# Patient Record
Sex: Female | Born: 1945 | Race: White | Hispanic: No | Marital: Married | Smoking: Never smoker
Health system: Southern US, Community
[De-identification: ages and names within clinical notes are randomized; demographics above are authoritative.]

## PROBLEM LIST (undated history)

## (undated) DIAGNOSIS — F329 Major depressive disorder, single episode, unspecified: Secondary | ICD-10-CM

## (undated) DIAGNOSIS — R112 Nausea with vomiting, unspecified: Secondary | ICD-10-CM

## (undated) DIAGNOSIS — E8809 Other disorders of plasma-protein metabolism, not elsewhere classified: Secondary | ICD-10-CM

## (undated) DIAGNOSIS — D496 Neoplasm of unspecified behavior of brain: Secondary | ICD-10-CM

## (undated) DIAGNOSIS — F32A Depression, unspecified: Secondary | ICD-10-CM

## (undated) DIAGNOSIS — Z923 Personal history of irradiation: Secondary | ICD-10-CM

## (undated) DIAGNOSIS — K219 Gastro-esophageal reflux disease without esophagitis: Secondary | ICD-10-CM

## (undated) DIAGNOSIS — K449 Diaphragmatic hernia without obstruction or gangrene: Secondary | ICD-10-CM

## (undated) DIAGNOSIS — Z9889 Other specified postprocedural states: Secondary | ICD-10-CM

## (undated) DIAGNOSIS — R51 Headache: Secondary | ICD-10-CM

## (undated) DIAGNOSIS — E785 Hyperlipidemia, unspecified: Secondary | ICD-10-CM

## (undated) DIAGNOSIS — C801 Malignant (primary) neoplasm, unspecified: Secondary | ICD-10-CM

## (undated) DIAGNOSIS — K439 Ventral hernia without obstruction or gangrene: Secondary | ICD-10-CM

## (undated) HISTORY — DX: Malignant (primary) neoplasm, unspecified: C80.1

## (undated) HISTORY — DX: Diaphragmatic hernia without obstruction or gangrene: K44.9

## (undated) HISTORY — DX: Neoplasm of unspecified behavior of brain: D49.6

## (undated) HISTORY — PX: CATARACT EXTRACTION: SUR2

---

## 1998-03-05 HISTORY — PX: TOTAL KNEE ARTHROPLASTY: SHX125

## 1999-03-06 HISTORY — PX: BUNIONECTOMY: SHX129

## 2005-03-05 HISTORY — PX: DILATION AND CURETTAGE OF UTERUS: SHX78

## 2005-04-24 ENCOUNTER — Ambulatory Visit (HOSPITAL_COMMUNITY): Admission: RE | Admit: 2005-04-24 | Discharge: 2005-04-24 | Payer: Self-pay | Admitting: Internal Medicine

## 2005-07-17 ENCOUNTER — Other Ambulatory Visit: Admission: RE | Admit: 2005-07-17 | Discharge: 2005-07-17 | Payer: Self-pay | Admitting: Obstetrics and Gynecology

## 2005-07-17 ENCOUNTER — Encounter: Admission: RE | Admit: 2005-07-17 | Discharge: 2005-07-17 | Payer: Self-pay | Admitting: Internal Medicine

## 2005-10-15 ENCOUNTER — Encounter: Admission: RE | Admit: 2005-10-15 | Discharge: 2005-10-15 | Payer: Self-pay | Admitting: Internal Medicine

## 2006-02-01 ENCOUNTER — Encounter (INDEPENDENT_AMBULATORY_CARE_PROVIDER_SITE_OTHER): Payer: Self-pay | Admitting: *Deleted

## 2006-02-01 ENCOUNTER — Ambulatory Visit (HOSPITAL_COMMUNITY): Admission: RE | Admit: 2006-02-01 | Discharge: 2006-02-01 | Payer: Self-pay | Admitting: Obstetrics and Gynecology

## 2006-07-10 ENCOUNTER — Other Ambulatory Visit: Admission: RE | Admit: 2006-07-10 | Discharge: 2006-07-10 | Payer: Self-pay | Admitting: Obstetrics and Gynecology

## 2006-07-19 ENCOUNTER — Encounter: Admission: RE | Admit: 2006-07-19 | Discharge: 2006-07-19 | Payer: Self-pay | Admitting: Obstetrics and Gynecology

## 2007-08-13 ENCOUNTER — Encounter: Admission: RE | Admit: 2007-08-13 | Discharge: 2007-08-13 | Payer: Self-pay | Admitting: Obstetrics and Gynecology

## 2007-09-16 ENCOUNTER — Other Ambulatory Visit: Admission: RE | Admit: 2007-09-16 | Discharge: 2007-09-16 | Payer: Self-pay | Admitting: Obstetrics and Gynecology

## 2008-09-15 ENCOUNTER — Encounter: Admission: RE | Admit: 2008-09-15 | Discharge: 2008-09-15 | Payer: Self-pay | Admitting: Obstetrics and Gynecology

## 2008-09-22 ENCOUNTER — Encounter: Admission: RE | Admit: 2008-09-22 | Discharge: 2008-09-22 | Payer: Self-pay | Admitting: Obstetrics and Gynecology

## 2008-09-29 ENCOUNTER — Other Ambulatory Visit: Admission: RE | Admit: 2008-09-29 | Discharge: 2008-09-29 | Payer: Self-pay | Admitting: Obstetrics and Gynecology

## 2009-09-19 ENCOUNTER — Encounter: Admission: RE | Admit: 2009-09-19 | Discharge: 2009-09-19 | Payer: Self-pay | Admitting: Obstetrics and Gynecology

## 2009-10-25 ENCOUNTER — Other Ambulatory Visit: Admission: RE | Admit: 2009-10-25 | Discharge: 2009-10-25 | Payer: Self-pay | Admitting: Obstetrics and Gynecology

## 2010-03-27 ENCOUNTER — Encounter: Payer: Self-pay | Admitting: Obstetrics and Gynecology

## 2010-07-21 NOTE — H&P (Signed)
Kelly Maxwell, Kelly Maxwell                 ACCOUNT NO.:  [id number]   MEDICAL RECORD NO.:  [id number]           PATIENT TYPE:   LOCATION:                                FACILITY:  [facility]   PHYSICIAN:  Gerald Leitz, MD          DATE OF BIRTH:  [birth year]   DATE OF ADMISSION:  02/01/2006  DATE OF DISCHARGE:                              HISTORY & PHYSICAL   HISTORY OF PRESENT ILLNESS:  This is a 65 year old G3, P3, with  postmenopausal bleeding.  She underwent an endometrial biopsy that was  found to be inadequate for ruling out hyperplasia or malignancy.  She  had an ultrasound on October 30 that showed the endometrium to be  echogenic and thickened fundally with a total thickness of 0.9 cm, a  questionable hyperechoic mass measuring 1.1 x 0.8 cm in the fundal  aspect of the endometrium.  Ovaries were normal bilaterally.  The  patient desires dilation and curettage hysteroscopy for further  evaluation.   OBSTETRICAL HISTORY:  Spontaneous vaginal every x3.   GYN HISTORY:  No history of sexually transmitted diseases.  Remote  history of abnormal Pap smears that resolved spontaneously.   PAST MEDICAL HISTORY:  Hypercholesterolemia.   PAST SURGICAL HISTORY:  Bunion removal and left total knee replacement.   MEDICATIONS:  Zocor, Wellbutrin and aspirin.   ALLERGIES:  COMPAZINE and CODEINE, which causes nausea.   SOCIAL HISTORY:  The patient is married.  She denies tobacco, alcohol or  illicit drug use.   FAMILY HISTORY:  Negative for breast, ovarian or colon cancer.   REVIEW OF SYSTEMS:  Negative except as stated in history of present  illness.   PHYSICAL EXAMINATION:  VITAL SIGNS:  Height 5 feet 6 inches, weight 179  pounds, blood pressure 122/86.  CARDIOVASCULAR:  Regular rate and rhythm.  LUNGS:  Clear to auscultation bilaterally.  ABDOMEN:  Soft, nontender, nondistended.  No masses.  PELVIC:  Normal external female genitalia.  No vulvar, vaginal or  cervical lesions noted.  Bimanual  exam reveals a normal-sized uterus.  No adnexal mass or tenderness.  EXTREMITIES:  No clubbing, cyanosis or edema is noted.   Ultrasound results from October 30 showed the uterus to measure 10.4 cm  in length, AP diameter is 4.6 cm, width is 5.2 cm, endometrial thickness  is 0.9 cm.  The right and left ovaries appeared normal bilaterally.   ASSESSMENT AND PLAN:  Postmenopausal bleeding with questionable fundal  endometrial mass.  Recommend hysteroscopy with D&C and possible removal  of endometrial polyp.  Risks, benefits, alternatives of surgery were  discussed with the patient including but not limited to infection,  bleeding, possible uterine perforation with need for further surgery.  Risks of transfusion, HIV, hepatitis B and C were discussed.  The  patient understands risks and desires to proceed with D&C, hysteroscopy  and possible removal of endometrial polyps.      Gerald Leitz, MD  Electronically Signed     TC/MEDQ  D:  01/29/2006  T:  01/30/2006  Job:  [id number]

## 2010-07-21 NOTE — Op Note (Signed)
Kelly Maxwell, Kelly Maxwell                 ACCOUNT NO.:  [id number]   MEDICAL RECORD NO.:  [id number]          PATIENT TYPE:  AMB   LOCATION:  [facility]                           FACILITY:  [facility]   PHYSICIAN:  Gerald Leitz, MD          DATE OF BIRTH:  1946-01-25   DATE OF PROCEDURE:  02/01/2006  DATE OF DISCHARGE:                               OPERATIVE REPORT   PREOPERATIVE DIAGNOSIS:  Post menopausal bleeding, possible endometrial  polyp.   POSTOPERATIVE DIAGNOSIS:  Post menopausal bleeding and endometrial  polyp.   PROCEDURE:  Hysteroscopy with resection of endometrial polyp, dilation  and curettage.   SURGEON:  Gerald Leitz, M.D.   ANESTHESIA:  General.   SPECIMEN:  Endometrial curettings and polyp sent to pathology.   ESTIMATED BLOOD LOSS:  Approximately 15 mL.   COMPLICATIONS:  None.   DESCRIPTION OF PROCEDURE:  The patient was taken to the operating room  where she was placed in the dorsal lithotomy position.  She was prepped  and draped in the usual sterile fashion.  A bivalve speculum was placed  into the vaginal vault.  The anterior lip of the cervix was grasped with  a single tooth tenaculum.  The uterus was sounded to 7.5 cm. The cervix  was then dilated to a number 31 Jackson-Pratt dilator.  The hysteroscope  was inserted.  The patient was noted to have an endometrial polyp and a  small 1 cm fibroid.  The hysteroscope was removed. Dilation and  curettage was performed with a sharp curet until a gritty texture was  noted all the way around. The hysteroscope and resectoscope was then  reinserted into the uterus and the polyp was resected using the  resectoscope loop.  10 mL of 0.25% Marcaine was injected to obtain a  paracervical block for postoperative anesthesia. The single tooth  tenaculum was removed from the anterior lip of the cervix.  Hemostasis  was maintained with silver nitrate.  Excellent hemostasis was then  noted.  The bivalve speculum was removed from the vaginal  vault.  Sponge, lap and needle counts were correct x2.   FINDINGS:  One endometrial polyps and a 1 cm fibroid were noted.      Gerald Leitz, MD  Electronically Signed     TC/MEDQ  D:  02/01/2006  T:  02/01/2006  Job:  [id number]

## 2010-09-12 ENCOUNTER — Other Ambulatory Visit: Payer: Self-pay | Admitting: Internal Medicine

## 2010-09-12 DIAGNOSIS — Z1231 Encounter for screening mammogram for malignant neoplasm of breast: Secondary | ICD-10-CM

## 2010-09-21 ENCOUNTER — Ambulatory Visit
Admission: RE | Admit: 2010-09-21 | Discharge: 2010-09-21 | Disposition: A | Discharge status: Home / Self Care | Payer: Medicare Other | Source: Ambulatory Visit | Attending: Internal Medicine | Admitting: Internal Medicine

## 2010-09-21 ENCOUNTER — Ambulatory Visit: Payer: Self-pay

## 2010-09-21 DIAGNOSIS — Z1231 Encounter for screening mammogram for malignant neoplasm of breast: Secondary | ICD-10-CM

## 2010-11-02 ENCOUNTER — Ambulatory Visit (HOSPITAL_COMMUNITY)
Admission: RE | Admit: 2010-11-02 | Discharge: 2010-11-02 | Disposition: A | Discharge status: Home / Self Care | Payer: Medicare Other | Source: Ambulatory Visit | Attending: Gastroenterology | Admitting: Gastroenterology

## 2010-11-02 ENCOUNTER — Other Ambulatory Visit: Payer: Self-pay | Admitting: Gastroenterology

## 2010-11-02 DIAGNOSIS — K219 Gastro-esophageal reflux disease without esophagitis: Secondary | ICD-10-CM | POA: Insufficient documentation

## 2010-11-02 DIAGNOSIS — E785 Hyperlipidemia, unspecified: Secondary | ICD-10-CM | POA: Insufficient documentation

## 2010-11-02 DIAGNOSIS — D126 Benign neoplasm of colon, unspecified: Secondary | ICD-10-CM | POA: Insufficient documentation

## 2010-11-02 DIAGNOSIS — Z860100 Personal history of colon polyps, unspecified: Secondary | ICD-10-CM | POA: Insufficient documentation

## 2010-11-02 DIAGNOSIS — Z79899 Other long term (current) drug therapy: Secondary | ICD-10-CM | POA: Insufficient documentation

## 2010-11-02 DIAGNOSIS — K5730 Diverticulosis of large intestine without perforation or abscess without bleeding: Secondary | ICD-10-CM | POA: Insufficient documentation

## 2010-11-12 NOTE — Op Note (Signed)
  NAMEEBONE, ALCIVAR NO.:  [id number]  MEDICAL RECORD NO.:  [id number]  LOCATION:  [facility]                         FACILITY:  [facility]  PHYSICIAN:  Danise Edge, M.D.   DATE OF BIRTH:  March 29, 1945  DATE OF PROCEDURE:  11/02/2010 DATE OF DISCHARGE:                              OPERATIVE REPORT   PROCEDURE:  Surveillance coloscopy.  REFERRING PHYSICIAN:  [name], MD.  HISTORY:  Ms. Artha Stavros is a 65 year old female, born on October 29, 1945.  Approximately 10 years ago, the patient underwent a colonoscopy with removal of neoplastic colon polyps.  In 2007, she underwent a normal surveillance colonoscopy.  She is scheduled to undergo a surveillance colonoscopy today.  ENDOSCOPIST:  Danise Edge, M.D.  PREMEDICATIONS:  Versed 6 mg, Fentanyl 50 mcg.  PROCEDURE:  The patient was placed in the left lateral decubitus position.  Anal inspection and digital rectal exam were normal.  The Pentax pediatric colonoscope was introduced into the rectum and easily advanced to the cecum.  A normal-appearing ileocecal valve and appendiceal orifice were identified.  Colonic preparation for the exam today was good.  Rectum normal.  Retroflexed view of the distal rectum normal. Sigmoid colon.  Colonic diverticulosis. Descending colon.  From the proximal descending colon, a 3 mm sessile polyp was removed with cold biopsy forceps. Splenic flexure normal. Transverse colon normal. Hepatic flexure normal. Ascending colon normal. Cecum and ileocecal valve.  From the mid-cecum, a 3-mm sessile polyp was removed with cold biopsy forceps.  ASSESSMENT: 1. Sigmoid colonic diverticulosis. 2. A 3 mm polyp was removed from the cecum. 3. A 3 mm polyp was removed from the proximal descending colon.  RECOMMENDATIONS:  Repeat surveillance colonoscopy in 5 years.          ______________________________ Danise Edge, M.D.     MJ/MEDQ  D:  11/02/2010  T:  11/02/2010  Job:   [id number]  cc:   [name], MD Fax: [phone]  Electronically Signed by Danise Edge M.D. on 11/12/2010 09:13:40 AM

## 2011-07-03 ENCOUNTER — Other Ambulatory Visit: Payer: Self-pay | Admitting: Dermatology

## 2011-08-13 ENCOUNTER — Other Ambulatory Visit: Payer: Self-pay | Admitting: Internal Medicine

## 2011-08-13 DIAGNOSIS — Z1231 Encounter for screening mammogram for malignant neoplasm of breast: Secondary | ICD-10-CM

## 2011-10-10 ENCOUNTER — Ambulatory Visit
Admission: RE | Admit: 2011-10-10 | Discharge: 2011-10-10 | Disposition: A | Discharge status: Home / Self Care | Payer: Medicare Other | Source: Ambulatory Visit | Attending: Internal Medicine | Admitting: Internal Medicine

## 2011-10-10 DIAGNOSIS — Z1231 Encounter for screening mammogram for malignant neoplasm of breast: Secondary | ICD-10-CM

## 2011-10-15 ENCOUNTER — Other Ambulatory Visit: Payer: Self-pay | Admitting: Internal Medicine

## 2011-10-15 DIAGNOSIS — R928 Other abnormal and inconclusive findings on diagnostic imaging of breast: Secondary | ICD-10-CM

## 2011-10-23 ENCOUNTER — Ambulatory Visit
Admission: RE | Admit: 2011-10-23 | Discharge: 2011-10-23 | Disposition: A | Discharge status: Home / Self Care | Payer: Medicare Other | Source: Ambulatory Visit | Attending: Internal Medicine | Admitting: Internal Medicine

## 2011-10-23 DIAGNOSIS — R928 Other abnormal and inconclusive findings on diagnostic imaging of breast: Secondary | ICD-10-CM

## 2011-11-20 ENCOUNTER — Other Ambulatory Visit: Payer: Self-pay | Admitting: Obstetrics and Gynecology

## 2011-11-20 ENCOUNTER — Other Ambulatory Visit (HOSPITAL_COMMUNITY)
Admission: RE | Admit: 2011-11-20 | Discharge: 2011-11-20 | Disposition: A | Discharge status: Home / Self Care | Payer: Medicare Other | Source: Ambulatory Visit | Attending: Obstetrics and Gynecology | Admitting: Obstetrics and Gynecology

## 2011-11-20 DIAGNOSIS — Z124 Encounter for screening for malignant neoplasm of cervix: Secondary | ICD-10-CM | POA: Insufficient documentation

## 2011-12-12 ENCOUNTER — Other Ambulatory Visit: Payer: Self-pay | Admitting: Internal Medicine

## 2011-12-12 DIAGNOSIS — R413 Other amnesia: Secondary | ICD-10-CM

## 2011-12-12 DIAGNOSIS — R519 Headache, unspecified: Secondary | ICD-10-CM

## 2011-12-12 LAB — CREATININE, SERUM: Creat: 0.5 mg/dL (ref 0.50–1.10)

## 2011-12-13 ENCOUNTER — Ambulatory Visit
Admission: RE | Admit: 2011-12-13 | Discharge: 2011-12-13 | Disposition: A | Discharge status: Home / Self Care | Payer: Medicare Other | Source: Ambulatory Visit | Attending: Internal Medicine | Admitting: Internal Medicine

## 2011-12-13 ENCOUNTER — Other Ambulatory Visit: Payer: Self-pay | Admitting: Internal Medicine

## 2011-12-13 DIAGNOSIS — G9389 Other specified disorders of brain: Secondary | ICD-10-CM

## 2011-12-13 DIAGNOSIS — R519 Headache, unspecified: Secondary | ICD-10-CM

## 2011-12-13 DIAGNOSIS — R413 Other amnesia: Secondary | ICD-10-CM

## 2011-12-13 MED ORDER — GADOBENATE DIMEGLUMINE 529 MG/ML IV SOLN
15.0000 mL | Freq: Once | INTRAVENOUS | Status: AC | PRN
  Administered 2011-12-13: 15 mL via INTRAVENOUS

## 2011-12-14 ENCOUNTER — Other Ambulatory Visit (HOSPITAL_COMMUNITY): Payer: Self-pay | Admitting: Neurosurgery

## 2011-12-14 ENCOUNTER — Encounter (HOSPITAL_COMMUNITY): Payer: Self-pay | Admitting: *Deleted

## 2011-12-14 ENCOUNTER — Other Ambulatory Visit: Payer: Self-pay | Admitting: Neurosurgery

## 2011-12-14 ENCOUNTER — Ambulatory Visit
Admission: RE | Admit: 2011-12-14 | Discharge: 2011-12-14 | Disposition: A | Discharge status: Home / Self Care | Payer: Medicare Other | Source: Ambulatory Visit | Attending: Internal Medicine | Admitting: Internal Medicine

## 2011-12-14 ENCOUNTER — Encounter (HOSPITAL_COMMUNITY): Payer: Self-pay | Admitting: Pharmacy Technician

## 2011-12-14 DIAGNOSIS — D496 Neoplasm of unspecified behavior of brain: Secondary | ICD-10-CM

## 2011-12-14 DIAGNOSIS — G9389 Other specified disorders of brain: Secondary | ICD-10-CM

## 2011-12-14 MED ORDER — IOHEXOL 300 MG/ML  SOLN
100.0000 mL | Freq: Once | INTRAMUSCULAR | Status: AC | PRN
  Administered 2011-12-14: 100 mL via INTRAVENOUS

## 2011-12-15 ENCOUNTER — Ambulatory Visit (HOSPITAL_COMMUNITY)
Admission: RE | Admit: 2011-12-15 | Discharge: 2011-12-15 | Disposition: A | Discharge status: Home / Self Care | Payer: Medicare Other | Source: Ambulatory Visit | Attending: Neurosurgery | Admitting: Neurosurgery

## 2011-12-15 DIAGNOSIS — D496 Neoplasm of unspecified behavior of brain: Secondary | ICD-10-CM

## 2011-12-15 DIAGNOSIS — G939 Disorder of brain, unspecified: Secondary | ICD-10-CM | POA: Insufficient documentation

## 2011-12-15 MED ORDER — IOHEXOL 300 MG/ML  SOLN
50.0000 mL | Freq: Once | INTRAMUSCULAR | Status: AC | PRN
  Administered 2011-12-15: 50 mL via INTRAVENOUS

## 2011-12-16 MED ORDER — CEFAZOLIN SODIUM-DEXTROSE 2-3 GM-% IV SOLR
2.0000 g | INTRAVENOUS | Status: AC
  Administered 2011-12-17: 2 g via INTRAVENOUS
  Filled 2011-12-16: qty 50

## 2011-12-17 ENCOUNTER — Ambulatory Visit (HOSPITAL_COMMUNITY): Payer: Medicare Other | Admitting: Anesthesiology

## 2011-12-17 ENCOUNTER — Encounter (HOSPITAL_COMMUNITY)
Admission: RE | Disposition: A | Discharge status: Home / Self Care | Payer: Self-pay | Source: Ambulatory Visit | Attending: Neurosurgery

## 2011-12-17 ENCOUNTER — Inpatient Hospital Stay (HOSPITAL_COMMUNITY)
Admission: RE | Admit: 2011-12-17 | Discharge: 2011-12-20 | DRG: 027 | Disposition: A | Discharge status: Home / Self Care | Payer: Medicare Other | Source: Ambulatory Visit | Attending: Neurosurgery | Admitting: Neurosurgery

## 2011-12-17 ENCOUNTER — Encounter (HOSPITAL_COMMUNITY): Payer: Self-pay | Admitting: Anesthesiology

## 2011-12-17 ENCOUNTER — Encounter (HOSPITAL_COMMUNITY): Payer: Self-pay | Admitting: *Deleted

## 2011-12-17 DIAGNOSIS — D496 Neoplasm of unspecified behavior of brain: Secondary | ICD-10-CM | POA: Diagnosis present

## 2011-12-17 DIAGNOSIS — Z96659 Presence of unspecified artificial knee joint: Secondary | ICD-10-CM

## 2011-12-17 DIAGNOSIS — F329 Major depressive disorder, single episode, unspecified: Secondary | ICD-10-CM | POA: Diagnosis present

## 2011-12-17 DIAGNOSIS — F3289 Other specified depressive episodes: Secondary | ICD-10-CM | POA: Diagnosis present

## 2011-12-17 DIAGNOSIS — E785 Hyperlipidemia, unspecified: Secondary | ICD-10-CM | POA: Diagnosis present

## 2011-12-17 DIAGNOSIS — C712 Malignant neoplasm of temporal lobe: Principal | ICD-10-CM | POA: Diagnosis present

## 2011-12-17 DIAGNOSIS — K219 Gastro-esophageal reflux disease without esophagitis: Secondary | ICD-10-CM | POA: Diagnosis present

## 2011-12-17 HISTORY — DX: Hyperlipidemia, unspecified: E78.5

## 2011-12-17 HISTORY — DX: Major depressive disorder, single episode, unspecified: F32.9

## 2011-12-17 HISTORY — PX: CRANIOTOMY: SHX93

## 2011-12-17 HISTORY — DX: Depression, unspecified: F32.A

## 2011-12-17 HISTORY — DX: Gastro-esophageal reflux disease without esophagitis: K21.9

## 2011-12-17 HISTORY — DX: Other specified postprocedural states: Z98.890

## 2011-12-17 HISTORY — DX: Headache: R51

## 2011-12-17 HISTORY — DX: Other specified postprocedural states: R11.2

## 2011-12-17 HISTORY — DX: Neoplasm of unspecified behavior of brain: D49.6

## 2011-12-17 LAB — BASIC METABOLIC PANEL
BUN: 25 mg/dL — ABNORMAL HIGH (ref 6–23)
CO2: 25 mEq/L (ref 19–32)
Chloride: 103 mEq/L (ref 96–112)
Creatinine, Ser: 0.65 mg/dL (ref 0.50–1.10)
GFR calc Af Amer: >90 mL/min (ref >90)
Glucose, Bld: 110 mg/dL — ABNORMAL HIGH (ref 70–99)
Potassium: 4.2 mEq/L (ref 3.5–5.1)

## 2011-12-17 LAB — CBC
HCT: 42.5 % (ref 36.0–46.0)
Hemoglobin: 14.7 g/dL (ref 12.0–15.0)
MCH: 30.9 pg (ref 26.0–34.0)
MCHC: 34.6 g/dL (ref 30.0–36.0)
MCV: 89.3 fL (ref 78.0–100.0)

## 2011-12-17 LAB — TYPE AND SCREEN: Antibody Screen: NEGATIVE

## 2011-12-17 SURGERY — CRANIOTOMY TUMOR EXCISION
Anesthesia: General | Site: Head | Laterality: Left | Wound class: Clean

## 2011-12-17 MED ORDER — ROCURONIUM BROMIDE 100 MG/10ML IV SOLN
INTRAVENOUS | Status: DC | PRN
  Administered 2011-12-17: 50 mg via INTRAVENOUS

## 2011-12-17 MED ORDER — DEXAMETHASONE SODIUM PHOSPHATE 4 MG/ML IJ SOLN
4.0000 mg | Freq: Four times a day (QID) | INTRAMUSCULAR | Status: AC
  Administered 2011-12-18 – 2011-12-19 (×4): 4 mg via INTRAVENOUS
  Filled 2011-12-17 (×4): qty 1

## 2011-12-17 MED ORDER — THROMBIN 20000 UNITS EX SOLR
CUTANEOUS | Status: DC | PRN
  Administered 2011-12-17: 13:00:00 via TOPICAL

## 2011-12-17 MED ORDER — MUPIROCIN 2 % EX OINT
TOPICAL_OINTMENT | Freq: Two times a day (BID) | CUTANEOUS | Status: DC
  Administered 2011-12-17: 1 via NASAL
  Administered 2011-12-18 – 2011-12-19 (×4): via NASAL
  Filled 2011-12-17 (×2): qty 22

## 2011-12-17 MED ORDER — SIMVASTATIN 40 MG PO TABS
40.0000 mg | ORAL_TABLET | Freq: Every day | ORAL | Status: DC
  Administered 2011-12-18 – 2011-12-19 (×2): 40 mg via ORAL
  Filled 2011-12-17 (×4): qty 1

## 2011-12-17 MED ORDER — HYDROMORPHONE HCL PF 1 MG/ML IJ SOLN
0.2500 mg | INTRAMUSCULAR | Status: DC | PRN
  Administered 2011-12-17: 0.5 mg via INTRAVENOUS

## 2011-12-17 MED ORDER — DEXAMETHASONE SODIUM PHOSPHATE 10 MG/ML IJ SOLN
6.0000 mg | Freq: Four times a day (QID) | INTRAMUSCULAR | Status: AC
  Administered 2011-12-17 – 2011-12-18 (×4): 6 mg via INTRAVENOUS
  Filled 2011-12-17 (×4): qty 0.6

## 2011-12-17 MED ORDER — ONDANSETRON HCL 4 MG PO TABS
4.0000 mg | ORAL_TABLET | ORAL | Status: DC | PRN
Start: 2011-12-17 — End: 2011-12-20

## 2011-12-17 MED ORDER — MORPHINE SULFATE 2 MG/ML IJ SOLN
INTRAMUSCULAR | Status: AC
  Filled 2011-12-17: qty 1

## 2011-12-17 MED ORDER — MANNITOL 25 % IV SOLN
INTRAVENOUS | Status: DC | PRN
  Administered 2011-12-17: 25 g via INTRAVENOUS

## 2011-12-17 MED ORDER — PANTOPRAZOLE SODIUM 40 MG PO TBEC: 80.0000 mg | DELAYED_RELEASE_TABLET | Freq: Every day | ORAL | Status: DC

## 2011-12-17 MED ORDER — FENTANYL CITRATE 0.05 MG/ML IJ SOLN
INTRAMUSCULAR | Status: DC | PRN
  Administered 2011-12-17: 50 ug via INTRAVENOUS
  Administered 2011-12-17: 150 ug via INTRAVENOUS
  Administered 2011-12-17: 50 ug via INTRAVENOUS

## 2011-12-17 MED ORDER — SODIUM CHLORIDE 0.9 % IV SOLN: 500.0000 mg | INTRAVENOUS | Status: DC | PRN

## 2011-12-17 MED ORDER — MORPHINE SULFATE 2 MG/ML IJ SOLN
1.0000 mg | INTRAMUSCULAR | Status: DC | PRN
  Administered 2011-12-17: 2 mg via INTRAVENOUS

## 2011-12-17 MED ORDER — ESTRADIOL 1 MG PO TABS
0.5000 mg | ORAL_TABLET | Freq: Every day | ORAL | Status: DC
  Administered 2011-12-18 – 2011-12-19 (×2): 0.5 mg via ORAL
  Filled 2011-12-17 (×4): qty 0.5

## 2011-12-17 MED ORDER — BACITRACIN 50000 UNITS IM SOLR
INTRAMUSCULAR | Status: AC
  Filled 2011-12-17: qty 1

## 2011-12-17 MED ORDER — TRETINOIN 0.025 % EX CREA: 1.0000 "application " | TOPICAL_CREAM | Freq: Every day | CUTANEOUS | Status: DC

## 2011-12-17 MED ORDER — HYDROCODONE-ACETAMINOPHEN 5-325 MG PO TABS
1.0000 | ORAL_TABLET | ORAL | Status: DC | PRN
  Administered 2011-12-17 – 2011-12-20 (×3): 1 via ORAL
  Filled 2011-12-17 (×3): qty 1

## 2011-12-17 MED ORDER — SODIUM CHLORIDE 0.9 % IV SOLN
INTRAVENOUS | Status: DC | PRN
  Administered 2011-12-17 (×2): via INTRAVENOUS

## 2011-12-17 MED ORDER — LABETALOL HCL 5 MG/ML IV SOLN: 10.0000 mg | INTRAVENOUS | Status: DC | PRN

## 2011-12-17 MED ORDER — DEXAMETHASONE SODIUM PHOSPHATE 4 MG/ML IJ SOLN
4.0000 mg | Freq: Three times a day (TID) | INTRAMUSCULAR | Status: DC
  Administered 2011-12-19 – 2011-12-20 (×2): 4 mg via INTRAVENOUS
  Filled 2011-12-17 (×5): qty 1

## 2011-12-17 MED ORDER — ONDANSETRON HCL 4 MG/2ML IJ SOLN: 4.0000 mg | Freq: Once | INTRAMUSCULAR | Status: DC | PRN

## 2011-12-17 MED ORDER — NITROGLYCERIN IN D5W 200-5 MCG/ML-% IV SOLN
INTRAVENOUS | Status: DC | PRN
  Administered 2011-12-17: 40 ug/min via INTRAVENOUS

## 2011-12-17 MED ORDER — SODIUM CHLORIDE 0.9 % IR SOLN
Status: DC | PRN
  Administered 2011-12-17 (×3): 1000 mL

## 2011-12-17 MED ORDER — CEFAZOLIN SODIUM 1-5 GM-% IV SOLN
1.0000 g | Freq: Three times a day (TID) | INTRAVENOUS | Status: AC
  Administered 2011-12-17 – 2011-12-18 (×4): 1 g via INTRAVENOUS
  Filled 2011-12-17 (×4): qty 50

## 2011-12-17 MED ORDER — VECURONIUM BROMIDE 10 MG IV SOLR
INTRAVENOUS | Status: DC | PRN
  Administered 2011-12-17: 2 mg via INTRAVENOUS

## 2011-12-17 MED ORDER — LIDOCAINE HCL (CARDIAC) 20 MG/ML IV SOLN
INTRAVENOUS | Status: DC | PRN
  Administered 2011-12-17: 100 mg via INTRAVENOUS

## 2011-12-17 MED ORDER — NYSTATIN-TRIAMCINOLONE 100000-0.1 UNIT/GM-% EX CREA
1.0000 "application " | TOPICAL_CREAM | Freq: Every day | CUTANEOUS | Status: DC
  Administered 2011-12-18 – 2011-12-19 (×2): 1 via TOPICAL
  Filled 2011-12-17: qty 15

## 2011-12-17 MED ORDER — MIDAZOLAM HCL 5 MG/5ML IJ SOLN
INTRAMUSCULAR | Status: DC | PRN
  Administered 2011-12-17: 2 mg via INTRAVENOUS

## 2011-12-17 MED ORDER — ACETAMINOPHEN 650 MG RE SUPP: 650.0000 mg | RECTAL | Status: DC | PRN

## 2011-12-17 MED ORDER — ARTIFICIAL TEARS OP OINT
TOPICAL_OINTMENT | OPHTHALMIC | Status: DC | PRN
  Administered 2011-12-17: 1 via OPHTHALMIC

## 2011-12-17 MED ORDER — ONDANSETRON HCL 4 MG/2ML IJ SOLN
INTRAMUSCULAR | Status: DC | PRN
  Administered 2011-12-17: 4 mg via INTRAVENOUS

## 2011-12-17 MED ORDER — LABETALOL HCL 5 MG/ML IV SOLN
INTRAVENOUS | Status: AC
  Filled 2011-12-17: qty 4

## 2011-12-17 MED ORDER — OXYCODONE HCL 5 MG PO TABS: 5.0000 mg | ORAL_TABLET | Freq: Once | ORAL | Status: DC | PRN

## 2011-12-17 MED ORDER — PANTOPRAZOLE SODIUM 40 MG PO TBEC
40.0000 mg | DELAYED_RELEASE_TABLET | Freq: Every day | ORAL | Status: DC
  Administered 2011-12-17 – 2011-12-19 (×3): 40 mg via ORAL
  Filled 2011-12-17 (×3): qty 1

## 2011-12-17 MED ORDER — MEDROXYPROGESTERONE ACETATE 2.5 MG PO TABS
2.5000 mg | ORAL_TABLET | Freq: Every day | ORAL | Status: DC
  Administered 2011-12-18 – 2011-12-19 (×2): 2.5 mg via ORAL
  Filled 2011-12-17 (×4): qty 1

## 2011-12-17 MED ORDER — SODIUM CHLORIDE 0.9 % IV SOLN
500.0000 mg | Freq: Two times a day (BID) | INTRAVENOUS | Status: DC
  Administered 2011-12-17 – 2011-12-19 (×4): 500 mg via INTRAVENOUS
  Filled 2011-12-17 (×5): qty 5

## 2011-12-17 MED ORDER — TRAMADOL HCL 50 MG PO TABS
50.0000 mg | ORAL_TABLET | Freq: Four times a day (QID) | ORAL | Status: DC | PRN
  Administered 2011-12-17: 50 mg via ORAL
  Filled 2011-12-17: qty 1

## 2011-12-17 MED ORDER — PROPOFOL 10 MG/ML IV BOLUS
INTRAVENOUS | Status: DC | PRN
  Administered 2011-12-17: 200 mg via INTRAVENOUS

## 2011-12-17 MED ORDER — MEPERIDINE HCL 25 MG/ML IJ SOLN: 6.2500 mg | INTRAMUSCULAR | Status: DC | PRN

## 2011-12-17 MED ORDER — HYDROMORPHONE HCL PF 1 MG/ML IJ SOLN
INTRAMUSCULAR | Status: AC
  Filled 2011-12-17: qty 1

## 2011-12-17 MED ORDER — SODIUM CHLORIDE 0.9 % IV SOLN
INTRAVENOUS | Status: AC
  Filled 2011-12-17: qty 500

## 2011-12-17 MED ORDER — SODIUM CHLORIDE 0.9 % IV SOLN
1000.0000 mg | INTRAVENOUS | Status: AC
  Administered 2011-12-17: 500 mg via INTRAVENOUS
  Filled 2011-12-17: qty 10

## 2011-12-17 MED ORDER — NEOSTIGMINE METHYLSULFATE 1 MG/ML IJ SOLN
INTRAMUSCULAR | Status: DC | PRN
  Administered 2011-12-17: 3 mg via INTRAVENOUS

## 2011-12-17 MED ORDER — VITAMIN D3 25 MCG (1000 UNIT) PO TABS
1000.0000 [IU] | ORAL_TABLET | Freq: Every day | ORAL | Status: DC
  Administered 2011-12-18 – 2011-12-19 (×2): 1000 [IU] via ORAL
  Filled 2011-12-17 (×3): qty 1

## 2011-12-17 MED ORDER — BACITRACIN ZINC 500 UNIT/GM EX OINT
TOPICAL_OINTMENT | CUTANEOUS | Status: DC | PRN
  Administered 2011-12-17 (×2): 1 via TOPICAL

## 2011-12-17 MED ORDER — GLYCOPYRROLATE 0.2 MG/ML IJ SOLN
INTRAMUSCULAR | Status: DC | PRN
  Administered 2011-12-17: 0.4 mg via INTRAVENOUS

## 2011-12-17 MED ORDER — ACETAMINOPHEN 325 MG PO TABS
650.0000 mg | ORAL_TABLET | ORAL | Status: DC | PRN
  Administered 2011-12-18: 650 mg via ORAL

## 2011-12-17 MED ORDER — SODIUM CHLORIDE 0.9 % IR SOLN
Status: DC | PRN
  Administered 2011-12-17: 13:00:00

## 2011-12-17 MED ORDER — PROMETHAZINE HCL 25 MG PO TABS: 12.5000 mg | ORAL_TABLET | ORAL | Status: DC | PRN

## 2011-12-17 MED ORDER — ONDANSETRON HCL 4 MG/2ML IJ SOLN
4.0000 mg | INTRAMUSCULAR | Status: DC | PRN
  Administered 2011-12-17 – 2011-12-18 (×2): 4 mg via INTRAVENOUS
  Filled 2011-12-17 (×2): qty 2

## 2011-12-17 MED ORDER — BUPROPION HCL ER (XL) 300 MG PO TB24
300.0000 mg | ORAL_TABLET | ORAL | Status: DC
  Administered 2011-12-18 – 2011-12-20 (×3): 300 mg via ORAL
  Filled 2011-12-17 (×4): qty 1

## 2011-12-17 MED ORDER — LIDOCAINE-EPINEPHRINE 1 %-1:100000 IJ SOLN
INTRAMUSCULAR | Status: DC | PRN
  Administered 2011-12-17: 20 mL

## 2011-12-17 MED ORDER — ADULT MULTIVITAMIN W/MINERALS CH
1.0000 | ORAL_TABLET | Freq: Every day | ORAL | Status: DC
  Administered 2011-12-18 – 2011-12-19 (×2): 1 via ORAL
  Filled 2011-12-17 (×3): qty 1

## 2011-12-17 MED ORDER — HYDRALAZINE HCL 20 MG/ML IJ SOLN
INTRAMUSCULAR | Status: AC
  Filled 2011-12-17: qty 1

## 2011-12-17 MED ORDER — DEXAMETHASONE SODIUM PHOSPHATE 4 MG/ML IJ SOLN
INTRAMUSCULAR | Status: DC | PRN
  Administered 2011-12-17: 6 mg via INTRAVENOUS
  Administered 2011-12-17: 4 mg via INTRAVENOUS

## 2011-12-17 MED ORDER — HYDRALAZINE HCL 20 MG/ML IJ SOLN: 5.0000 mg | INTRAMUSCULAR | Status: DC | PRN

## 2011-12-17 MED ORDER — OXYCODONE HCL 5 MG/5ML PO SOLN: 5.0000 mg | Freq: Once | ORAL | Status: DC | PRN

## 2011-12-17 SURGICAL SUPPLY — 80 items
ATTRACTOMAT 16X20 MAGNETIC DRP (DRAPES) ×2 IMPLANT
BAG DECANTER FOR FLEXI CONT (MISCELLANEOUS) ×2 IMPLANT
BANDAGE GAUZE 4  KLING STR (GAUZE/BANDAGES/DRESSINGS) IMPLANT
BENZOIN TINCTURE PRP APPL 2/3 (GAUZE/BANDAGES/DRESSINGS) ×2 IMPLANT
BIT DRILL WIRE PASS 1.3MM (BIT) IMPLANT
BLADE CLIPPER SURG NEURO (BLADE) ×2 IMPLANT
BRUSH SCRUB EZ 1% IODOPHOR (MISCELLANEOUS) ×2 IMPLANT
BUR ACORN 6.0 PRECISION (BURR) ×2 IMPLANT
BUR ADDG 1.1 (BURR) IMPLANT
BUR ROUTER D-58 CRANI (BURR) ×2 IMPLANT
CANISTER SUCTION 2500CC (MISCELLANEOUS) ×2 IMPLANT
CLIP RANEY DISP (INSTRUMENTS) ×6 IMPLANT
CLIP TI MEDIUM 6 (CLIP) IMPLANT
CLOTH BEACON ORANGE TIMEOUT ST (SAFETY) ×2 IMPLANT
CONT SPEC 4OZ CLIKSEAL STRL BL (MISCELLANEOUS) ×6 IMPLANT
CORDS BIPOLAR (ELECTRODE) ×2 IMPLANT
COTTONBALL LRG STERILE PKG (GAUZE/BANDAGES/DRESSINGS) ×2 IMPLANT
DRAIN SNY WOU 7FLT (WOUND CARE) IMPLANT
DRAPE MICROSCOPE LEICA (MISCELLANEOUS) IMPLANT
DRAPE NEUROLOGICAL W/INCISE (DRAPES) ×2 IMPLANT
DRAPE ORTHO SPLIT 77X108 STRL (DRAPES) ×1
DRAPE SURG ORHT 6 SPLT 77X108 (DRAPES) ×1 IMPLANT
DRAPE WARM FLUID 44X44 (DRAPE) ×2 IMPLANT
DRESSING TELFA 8X3 (GAUZE/BANDAGES/DRESSINGS) ×2 IMPLANT
DRILL WIRE PASS 1.3MM (BIT)
DRSG OPSITE 4X5.5 SM (GAUZE/BANDAGES/DRESSINGS) ×4 IMPLANT
ELECT NEEDLE TIP 2.8 STRL (NEEDLE) ×2 IMPLANT
ELECT REM PT RETURN 9FT ADLT (ELECTROSURGICAL) ×2
ELECTRODE REM PT RTRN 9FT ADLT (ELECTROSURGICAL) ×1 IMPLANT
EVACUATOR 1/8 PVC DRAIN (DRAIN) IMPLANT
EVACUATOR SILICONE 100CC (DRAIN) IMPLANT
FORCEPS BIPOLAR SPETZLER 8 1.0 (NEUROSURGERY SUPPLIES) ×2 IMPLANT
GAUZE SPONGE 4X4 16PLY XRAY LF (GAUZE/BANDAGES/DRESSINGS) ×2 IMPLANT
GLOVE BIOGEL PI IND STRL 8 (GLOVE) ×1 IMPLANT
GLOVE BIOGEL PI INDICATOR 8 (GLOVE) ×1
GLOVE ECLIPSE 7.5 STRL STRAW (GLOVE) ×4 IMPLANT
GLOVE ECLIPSE 8.5 STRL (GLOVE) ×2 IMPLANT
GLOVE EXAM NITRILE LRG STRL (GLOVE) IMPLANT
GLOVE EXAM NITRILE MD LF STRL (GLOVE) IMPLANT
GLOVE EXAM NITRILE XL STR (GLOVE) IMPLANT
GLOVE EXAM NITRILE XS STR PU (GLOVE) IMPLANT
GOWN BRE IMP SLV AUR LG STRL (GOWN DISPOSABLE) IMPLANT
GOWN BRE IMP SLV AUR XL STRL (GOWN DISPOSABLE) ×2 IMPLANT
GOWN STRL REIN 2XL LVL4 (GOWN DISPOSABLE) IMPLANT
HEMOSTAT SURGICEL 2X14 (HEMOSTASIS) ×2 IMPLANT
HOOK DURA (MISCELLANEOUS) ×2 IMPLANT
KIT BASIN OR (CUSTOM PROCEDURE TRAY) ×2 IMPLANT
KIT ROOM TURNOVER OR (KITS) ×2 IMPLANT
MARKER SKIN DUAL TIP RULER LAB (MISCELLANEOUS) ×2 IMPLANT
MARKER SPHERE PSV REFLC NDI (MISCELLANEOUS) ×4 IMPLANT
NEEDLE HYPO 22GX1.5 SAFETY (NEEDLE) ×2 IMPLANT
NS IRRIG 1000ML POUR BTL (IV SOLUTION) ×4 IMPLANT
PACK CRANIOTOMY (CUSTOM PROCEDURE TRAY) IMPLANT
PACK LAMINECTOMY NEURO (CUSTOM PROCEDURE TRAY) ×2 IMPLANT
PAD ARMBOARD 7.5X6 YLW CONV (MISCELLANEOUS) ×6 IMPLANT
PATTIES SURGICAL .25X.25 (GAUZE/BANDAGES/DRESSINGS) IMPLANT
PATTIES SURGICAL .5 X.5 (GAUZE/BANDAGES/DRESSINGS) IMPLANT
PATTIES SURGICAL .5 X3 (DISPOSABLE) ×2 IMPLANT
PATTIES SURGICAL .75X.75 (GAUZE/BANDAGES/DRESSINGS) ×2 IMPLANT
PATTIES SURGICAL 1/4 X 3 (GAUZE/BANDAGES/DRESSINGS) IMPLANT
PATTIES SURGICAL 1X1 (DISPOSABLE) IMPLANT
PLATE 1.5 6HOLE XLONG DBL Y (Plate) ×6 IMPLANT
RUBBERBAND STERILE (MISCELLANEOUS) IMPLANT
SCREW SELF DRILL HT 1.5/4MM (Screw) ×12 IMPLANT
SPECIMEN JAR SMALL (MISCELLANEOUS) IMPLANT
SPONGE GAUZE 4X4 12PLY (GAUZE/BANDAGES/DRESSINGS) ×2 IMPLANT
SPONGE NEURO XRAY DETECT 1X3 (DISPOSABLE) ×2 IMPLANT
SPONGE SURGIFOAM ABS GEL 100 (HEMOSTASIS) ×2 IMPLANT
STAPLER SKIN PROX WIDE 3.9 (STAPLE) ×2 IMPLANT
SUT NURALON 4 0 TR CR/8 (SUTURE) ×4 IMPLANT
SUT VIC AB 2-0 CP2 18 (SUTURE) ×4 IMPLANT
SYR 20ML ECCENTRIC (SYRINGE) ×2 IMPLANT
SYR CONTROL 10ML LL (SYRINGE) ×2 IMPLANT
TIP ISOCOOL LP 2.0MM (INSTRUMENTS) IMPLANT
TOWEL OR 17X24 6PK STRL BLUE (TOWEL DISPOSABLE) ×2 IMPLANT
TOWEL OR 17X26 10 PK STRL BLUE (TOWEL DISPOSABLE) ×2 IMPLANT
TRAY FOLEY CATH 14FRSI W/METER (CATHETERS) ×2 IMPLANT
TUBE CONNECTING 12X1/4 (SUCTIONS) ×2 IMPLANT
UNDERPAD 30X30 INCONTINENT (UNDERPADS AND DIAPERS) ×2 IMPLANT
WATER STERILE IRR 1000ML POUR (IV SOLUTION) ×2 IMPLANT

## 2011-12-17 NOTE — Preoperative (Signed)
Beta Blockers   Reason not to administer Beta Blockers:Not Applicable 

## 2011-12-17 NOTE — Interval H&P Note (Signed)
History and Physical Interval Note:  12/17/2011 11:35 AM  Kelly Maxwell  has presented today for surgery, with the diagnosis of brain tumor  The various methods of treatment have been discussed with the patient and family. After consideration of risks, benefits and other options for treatment, the patient has consented to  Procedure(s) (LRB) with comments: CRANIOTOMY TUMOR EXCISION (Left) - LEFT temporal craniotomy with stealth for tumor resection as a surgical intervention .  The patient's history has been reviewed, patient examined, no change in status, stable for surgery.  I have reviewed the patient's chart and labs.  Questions were answered to the patient's satisfaction.     [name] R

## 2011-12-17 NOTE — Anesthesia Procedure Notes (Signed)
Procedure Name: Intubation Date/Time: 12/17/2011 11:55 AM Performed by: Jerilee Hoh Pre-anesthesia Checklist: Patient identified Patient Re-evaluated:Patient Re-evaluated prior to inductionOxygen Delivery Method: Circle system utilized Preoxygenation: Pre-oxygenation with 100% oxygen Intubation Type: IV induction Ventilation: Mask ventilation without difficulty and Oral airway inserted - appropriate to patient size Laryngoscope Size: Mac and 3 Grade View: Grade I Tube type: Oral Tube size: 7.5 mm Number of attempts: 1 Airway Equipment and Method: Stylet Placement Confirmation: ETT inserted through vocal cords under direct vision,  positive ETCO2 and breath sounds checked- equal and bilateral Secured at: 22 cm Tube secured with: Tape Dental Injury: Teeth and Oropharynx as per pre-operative assessment

## 2011-12-17 NOTE — Op Note (Signed)
12/17/2011  2:37 PM  PATIENT:  Kelly Maxwell  66 y.o. female  PRE-OPERATIVE DIAGNOSIS:  brain tumor  POST-OPERATIVE DIAGNOSIS:  brain tumor - high grade glioma   PROCEDURE:  Procedure(s): Left temporal CRANIOTOMY TUMOR EXCISION, stealth stereotactic crany  SURGEON:  Surgeon(s): Clydene Fake, MD Temple Pacini, MD-assist    ANESTHESIA:   general  EBL:  Total I/O In: 1400 [I.V.:1400] Out: 225 [Urine:150; Blood:75]  BLOOD ADMINISTERED:none  DRAINS: none   SPECIMEN:  Excision  DICTATION: Patient patient with membrane speech problems with headaches MRI was done showing brain tumor left-sided large temporal lesion also some lesions left frontal lobe adjacent to the sylvian fissure and left basal ganglia patient brought in for craniotomy and resection of the temporal lesion.  Patient brought into operating room general anesthesia induced patient placed in [place] head pins we then oriented the Stealth with the patient's own anatomy in the good correlation a craniotomy was then planned on the skin incision to be over the tumor and patient prepped draped in a sterile fashion. Slight incision injected with 20 cc 1% lidocaine with epinephrine. An incision was then made over the left temporal region incision taken of the skin to the temporalis fascia hemostasis obtained with physician Bovie is used to extend the incision down to the skull flap reflected anteriorly held with fishhooks.  Using the Stealth again complained the plan a craniotomy and a bur was made anterior inferior temporal region and skull flap was elevated using craniotome. Bone was submitted about solution to the end of the case dura was then opened in cruciate fashion we used the Stealth again to plan R. corticotomy for the medial temporal gyrus position nursemaid we to dissect down to the tumor. Tumor isn't really encapsulated we will get around the tumor very easily with a one [place]field and the tumor removed any  encapsulated bulb. Sent for frozen and permanent section frozen came back high-grade glioma. Tumor bed was evaluated no further tumor was seen hemostasis obtained with bipolar cauterization Gelfoam and thrombin Gelfoam was irrigated out we line area with Surgicel. We had very good hemostasis dura was then closed with 4-0 Nurolon suture bone flap was put back into position held in place with microplates and screws skin flap rocked back into position temporalis fascia closed with 2 Vicryl interrupted sutures galea closed with 2 Vicryl sutures skin closed with staples dressing was placed patient removed from head pins the woken from anesthesia transferred to recovery room  PLAN OF CARE: Admit to inpatient   PATIENT DISPOSITION:  PACU - hemodynamically stable.

## 2011-12-17 NOTE — Anesthesia Preprocedure Evaluation (Signed)
Anesthesia Evaluation  Patient identified by MRN, date of birth, ID band Patient awake    Reviewed: Allergy & Precautions, H&P , NPO status , Patient's Chart, lab work & pertinent test results  History of Anesthesia Complications (+) PONV  Airway Mallampati: I TM Distance: >3 FB Neck ROM: Full    Dental   Pulmonary          Cardiovascular     Neuro/Psych    GI/Hepatic GERD-  Medicated and Controlled,  Endo/Other    Renal/GU      Musculoskeletal   Abdominal   Peds  Hematology   Anesthesia Other Findings   Reproductive/Obstetrics                           Anesthesia Physical Anesthesia Plan  ASA: III  Anesthesia Plan: General   Post-op Pain Management:    Induction: Intravenous  Airway Management Planned: Oral ETT  Additional Equipment: Arterial line  Intra-op Plan:   Post-operative Plan: Extubation in OR  Informed Consent: I have reviewed the patients History and Physical, chart, labs and discussed the procedure including the risks, benefits and alternatives for the proposed anesthesia with the patient or authorized representative who has indicated his/her understanding and acceptance.     Plan Discussed with: CRNA and Surgeon  Anesthesia Plan Comments:         Anesthesia Quick Evaluation

## 2011-12-17 NOTE — H&P (Signed)
See H& P.

## 2011-12-17 NOTE — Progress Notes (Signed)
Art line does not co-relate with cuff.

## 2011-12-17 NOTE — Transfer of Care (Signed)
Immediate Anesthesia Transfer of Care Note  Patient: Kelly Maxwell  Procedure(s) Performed: Procedure(s) (LRB) with comments: CRANIOTOMY TUMOR EXCISION (Left) - LEFT temporal craniotomy with stealth for tumor resection  Patient Location: PACU  Anesthesia Type: General  Level of Consciousness: awake and alert   Airway & Oxygen Therapy: Patient Spontanous Breathing and Patient connected to nasal cannula oxygen  Post-op Assessment: Report given to PACU RN, Post -op Vital signs reviewed and stable and Patient moving all extremities  Post vital signs: Reviewed and stable  Complications: No apparent anesthesia complications

## 2011-12-17 NOTE — Anesthesia Postprocedure Evaluation (Signed)
Anesthesia Post Note  Patient: Kelly Maxwell  Procedure(s) Performed: Procedure(s) (LRB): CRANIOTOMY TUMOR EXCISION (Left)  Anesthesia type: general  Patient location: PACU  Post pain: Pain level controlled  Post assessment: Patient's Cardiovascular Status Stable  Last Vitals:  Filed Vitals:   12/17/11 1530  BP: 145/73  Pulse: 44  Temp: 36.6 C  Resp: 13    Post vital signs: Reviewed and stable  Level of consciousness: sedated  Complications: No apparent anesthesia complications

## 2011-12-18 ENCOUNTER — Encounter (HOSPITAL_COMMUNITY): Payer: Self-pay | Admitting: Oncology

## 2011-12-18 DIAGNOSIS — D496 Neoplasm of unspecified behavior of brain: Secondary | ICD-10-CM | POA: Diagnosis present

## 2011-12-18 DIAGNOSIS — C712 Malignant neoplasm of temporal lobe: Principal | ICD-10-CM

## 2011-12-18 LAB — BASIC METABOLIC PANEL
Chloride: 107 mEq/L (ref 96–112)
GFR calc Af Amer: >90 mL/min (ref >90)
Potassium: 4 mEq/L (ref 3.5–5.1)
Sodium: 138 mEq/L (ref 135–145)

## 2011-12-18 NOTE — Consult Note (Signed)
Referring MD: Dr. Hazle Coca  PCP:  Dr. Benita Stabile   Reason for Referral: Medical oncology consultation for newly diagnosed glioblastoma brain tum   HPI:  New patient evaluation for this pleasant 66 year old never smoker, nondrinker Caucasian woman who has been in overall excellent health without any major medical or surgical illness. She has noticed a problem with her memory just in the last 2 or 3 weeks. She had trouble finding words and some of the words were not coming out right.She was repeating some words. She had a single episode of a left parietal headache but other than that has not had any chronic or progressive headache. She denied any change in vision. No diplopia. No slurred speech. No focal weakness. There is a family history of strokes and she thought she might be  having a minor stroke. She scheduled a visit with her internist. An  MRI of the brain was done on 10/10  and unfortunately showed a ring-enhancing lesion in the left temporal lobe 2.7x3.6x2.5 cm with several surrounding satellite lesions. Findings were confirmed on subsequent pre-op CT done 10/12.  A CT scan of the chest, abdomen, and pelvis was done and was unrevealing for a primary source of tumor other than the brain. There was a left thyroid nodule 3.1x1.9 cm.  She was referred to neurosurgery. She underwent a subtotal resection of the dominant lesion yesterday on October 14. She is recuperating very rapidly from surgery. She is already up in a chair and has all her makeup on.   Past Medical History  Diagnosis Date  . PONV (postoperative nausea and vomiting)   . Depression   . GERD (gastroesophageal reflux disease)   . Headache   . HLD (hyperlipidemia)   : No history of hypertension, ulcers, diabetes, hepatitis, yellow jaundice, kidney stones, thyroid trouble, seizure, stroke, or blood clots.   Past Surgical History  Procedure Date  . Total knee arthroplasty     left  . Dilation and curettage of uterus   .  Bunionectomy     right  :     . bacitracin      . buPROPion  300 mg Oral BH-q7a  .  ceFAZolin (ANCEF) IV  1 g Intravenous Q8H  . cholecalciferol  1,000 Units Oral Daily  . dexamethasone  6 mg Intravenous Q6H   Followed by  . dexamethasone  4 mg Intravenous Q6H   Followed by  . dexamethasone  4 mg Intravenous Q8H  . estradiol  0.5 mg Oral QHS  . hydrALAZINE      . HYDROmorphone      . levetiracetam  500 mg Intravenous Q12H  . medroxyPROGESTERone  2.5 mg Oral QHS  . morphine      . multivitamin with minerals  1 tablet Oral Daily  . mupirocin ointment   Nasal BID  . nystatin-triamcinolone  1 application Topical Daily  . pantoprazole  40 mg Oral Daily  . simvastatin  40 mg Oral QHS  . sodium chloride      . DISCONTD: pantoprazole  80 mg Oral Q1200  . DISCONTD: tretinoin  1 application Topical Daily  :  Allergies  Allergen Reactions  . Codeine Nausea And Vomiting  . Compazine (Prochlorperazine Edisylate) Other (See Comments)    Became hyper  :    History   Social History  . Marital Status: Married    Spouse Name:  first husband had a GBM and died 12 years ago     Number of Children:  2 sons in their 93s and a daughter in her 30s who are healthy   . Years of Education: N/A   Occupational History  .    Social Hiretired but active story Main Topics  . Smoking status: Never Smoker   . Smokeless tobacco: Not on file  . Alcohol Use: No  . Drug Use: No  . Sexually Active:    Other Topics Concern  . Not on file   Social History Narrative  .  she is retired. She is a never smoker. She does not drink alcohol. Her first husband died in the year 1998/08/06 of a glioblastoma brain tumor and was treated as deep. He had a very rough time with the chemotherapy with significant nausea and vomiting. She is remarried and her current husband accompanies her today.   : Family history: 3 brothers one deceased who had epilepsy. Younger brother in a 70 year old older brother overall good  health.   ROS: Eyes:See above  Throat:  no dysphasia  Neck: no stiff neck  Resp:   no cough or dyspnea  Cardio:  no chest pain or palpitations GI:No change in bowel habit. Colonoscopy one year ago. Benign polyps removed.  Extremities:  no swelling of the extremities  Lymph nodes:  not questioned  Neurologic: See above   Skin: . no rash. A few skin tags removed from upper chest wall in the past by dermatologist. All benign.  Genitourinary:   Vitals: Filed Vitals:   12/18/11 1400  BP: 134/75  Pulse: 65  Temp:   Resp: 16    PHYSICAL EXAM: General appearance: fair skinned Caucasian woman who looks much younger than her stated age Head:  surgical wound left temporoparietal region  Eyes:Normal  Throat:  no erythema or exudate  Neck:  full range of motion  Lymph Nodes:No adenopathy  Resp:  clear to auscultation resonant to percussion  Cardio:  regular rhythm no murmur  Breasts: not examined  GI:  soft nontender  GU: not examined  Extremities:No edema no calf tenderness  Vascular: no carotid bruits. Carotid pulses 2+ symmetric. No cyanosis.  Neurologic: Mental status intact, cranial nerves grossly normal, PERRLA, full extraocular movements, motor strength 5 over 5 all extremities, reflexes 1+ symmetric at the biceps and 2+ at the right knee trace to 1+ at the left knee-previous left knee replacement  Skin: no rash or ecchymosis   Labs:   [facility] 12/17/11 0746  WBC 12.7*  HGB 14.7  HCT 42.5  PLT 336     Images Studies/Results: see discussion above     Pathology: Frozen section consistent with a glioma primary brain tumor. Permanent sections are pending    Assessment and Plan:  Primary brain tumor likely glioblastoma.  Dominant lesion resected. Small surrounding satellite lesions remain.   She is very healthy for 66 years old and has no major medical or surgical illness that would prohibit standard aggressive therapy given to a younger person.  Standard  treatment remains oral Temodar alkylating  agent chemotherapy with concomitant whole brain radiation followed by 6 months of adjuvant oral Temodar. Unfortunately 2 recent phase 3 trials have matured which looked at adding Avastin, vascular endothelial growth factor receptor inhibitor, as part of primary therapy. Although both studies showed a modest improvement in progression free survival, there was no overall survival benefit and there were unacceptable serious side effects in some patients. Therefore, this is not considered a standard approach outside of a clinical trial.  What has become apparent and important  is to check the tumor tissue for methylation of the methyl guanine methyltransferase promoter (MGMT) which predicts response to Temodar. Absent methylation predicts a poor response to Temodar and if this is the case, alternative therapy should be sought.  There are number of areas of clinical research ongoing including vaccine therapy, use of intrathecal monoclonal antibodies, use of "suicide gene therapy", use of "educated" T cells she and use of agents which inhibit invasion of tumor through the basement membrane such as celingitide. these are subjects of ongoing clinical trials.  Recommendation: I will  set up an outpatient followup visit to discuss her options in more detail and see if there any open clinical trials that would be more attractive than standard therapy outlined above. She should also see a radiation oncologist in consultation.   Thank you for this consultation  CC Dr Hazle Coca, Dr Benita Stabile, Dr [name]  Sarajean Dessert M 12/18/2011, 3:13 PM

## 2011-12-18 NOTE — Progress Notes (Signed)
Subjective: Patient reports sl incis pain , sl nausia, not OOB yet  Objective: Vital signs in last 24 hours: Temp:  [97.6 F (36.4 C)-98.7 F (37.1 C)] 97.9 F (36.6 C) (10/15 0700) Pulse Rate:  [43-79] 59  (10/15 0600) Resp:  [12-35] 14  (10/15 0600) BP: (90-173)/(46-145) 141/74 mmHg (10/15 0600) SpO2:  [96 %-100 %] 99 % (10/15 0600) Arterial Line BP: (156-172)/(59-65) 156/59 mmHg (10/14 1530) Weight:  [75.7 kg (166 lb 14.2 oz)] 75.7 kg (166 lb 14.2 oz) (10/14 1600)  Intake/Output from previous day: 10/14 0701 - 10/15 0700 In: 2372 [P.O.:360; I.V.:1700; IV Piggyback:312] Out: 2125 [Urine:2050; Blood:75] Intake/Output this shift:   AAOx3, moves all well , speech  - only sl. Word finding difficulty - no anomia Wound:c/d/i  Lab Results:  Basename 12/17/11 0746  WBC 12.7*  HGB 14.7  HCT 42.5  PLT 336   BMET  Basename 12/18/11 0440 12/17/11 0746  NA 138 139  K 4.0 4.2  CL 107 103  CO2 22 25  GLUCOSE 131* 110*  BUN 17 25*  CREATININE 0.50 0.65  CALCIUM 8.7 9.9    Studies/Results: No results found.  Assessment/Plan: Pt doing well, increase activity - transfer to floor later today  LOS: 1 day     Amanuel Sinkfield R, MD 12/18/2011, 7:53 AM

## 2011-12-18 NOTE — Progress Notes (Signed)
UR completed 

## 2011-12-18 NOTE — Clinical Documentation Improvement (Signed)
Abnormal Labs Clarification  THIS DOCUMENT IS NOT A PERMANENT PART OF THE MEDICAL RECORD  Please update your documentation within the medical record to reflect your response to this query.                                                                                   12/18/11  Dear Dr. Phoebe Perch,  In a better effort to capture your patient's severity of illness, reflect appropriate length of stay and utilization of resources, a review of the medical record has revealed the following indicators.   Based on your clinical judgment, please clarify and document in a progress note and/or discharge summary the clinical condition associated with the following supporting information: In responding to this query please exercise your independent judgment.  The fact that a query is asked, does not imply that any particular answer is desired or expected.   Hello Dr. Phoebe Perch!  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 Decadron mg IV started on 10/14. Per MRI results/impression 10/09: "surrounding vasogenic edema causing compression of the left lateral ventricle and 5.8 mm of midline shift". If possible, please help clarify if the diagnosis found in this MRI is relevant to this hospital admission by listing it as a current diagnosis/problem in the progress notes and also in the final discharge summary. Thank you!  Abnormal findings (laboratory, x-ray, pathologic, and other diagnostic results) are not coded and reported unless the physician indicates their clinical significance.       Possible Clinical Conditions?  - Vasogenic Edema  - Cerebral Edema  - Other condition (please document in the progress notes and/or discharge summary)  - Cannot Clinically determine at this time      No additional documentation in chart upon review. SM   Thank You,  Saul Fordyce  Clinical Documentation Specialist: [phone] Pager  Health Information [facility] [place]

## 2011-12-18 NOTE — Progress Notes (Signed)
Pt. Requested to not have MRI tonight. Pt. States she has "really bad anxiety" with MRIs. Would rather speak with Dr. Phoebe Perch to discuss other options. The on call MD was called and notified. Pt. Will speak with Dr. Phoebe Perch when he rounds tomorrow.

## 2011-12-19 ENCOUNTER — Encounter (HOSPITAL_COMMUNITY): Payer: Self-pay | Admitting: Neurosurgery

## 2011-12-19 ENCOUNTER — Telehealth: Payer: Self-pay | Admitting: Oncology

## 2011-12-19 ENCOUNTER — Inpatient Hospital Stay (HOSPITAL_COMMUNITY): Payer: Medicare Other

## 2011-12-19 MED ORDER — LEVETIRACETAM 500 MG PO TABS
500.0000 mg | ORAL_TABLET | Freq: Two times a day (BID) | ORAL | Status: DC
  Administered 2011-12-19 – 2011-12-20 (×2): 500 mg via ORAL
  Filled 2011-12-19 (×4): qty 1

## 2011-12-19 MED ORDER — DIAZEPAM 5 MG PO TABS
5.0000 mg | ORAL_TABLET | ORAL | Status: DC
  Administered 2011-12-19: 5 mg via ORAL
  Filled 2011-12-19: qty 1

## 2011-12-19 MED ORDER — GADOBENATE DIMEGLUMINE 529 MG/ML IV SOLN
15.0000 mL | Freq: Once | INTRAVENOUS | Status: AC | PRN
  Administered 2011-12-19: 15 mL via INTRAVENOUS

## 2011-12-19 NOTE — Telephone Encounter (Signed)
Pt still in hosptial s/w nurse Kasie on the floor abt hosp f/U appt 10/23 @ 3 w/Dr. Cyndie Chime NP packet mailed.

## 2011-12-20 ENCOUNTER — Telehealth: Payer: Self-pay | Admitting: Oncology

## 2011-12-20 MED ORDER — DEXAMETHASONE 4 MG PO TABS: 4.0000 mg | ORAL_TABLET | Freq: Three times a day (TID) | ORAL | Status: DC

## 2011-12-20 MED ORDER — HYDROCODONE-ACETAMINOPHEN 5-325 MG PO TABS: 1.0000 | ORAL_TABLET | ORAL | Status: DC | PRN

## 2011-12-20 NOTE — Discharge Summary (Signed)
Physician Discharge Summary  Patient ID: Kelly Maxwell MRN: [id number] DOB/AGE: [birth year] 68 y.o.  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 rate 9, height 5\' 4"  (1.626 m), weight 75.8 kg (167 lb 1.7 oz), SpO2 98.00%. Wound:c/d/i  Disposition: home  Discharge Orders    Future Appointments: Provider: Department: Dept Phone: Center:   12/26/2011 3:00 PM [facility] [phone] None   12/26/2011 3:30 PM Levert Feinstein, MD [facility] [phone] None       Medication List     As of 12/20/2011  8:03 AM    TAKE these medications         aspirin EC 81 MG tablet   Take 81 mg by mouth at bedtime.      buPROPion 300 MG 24 hr tablet   Commonly known as: WELLBUTRIN XL   Take 300 mg by mouth every morning.      CALCIUM + D PO   Take 1 tablet by mouth 2 (two) times daily.      cholecalciferol 1000 UNITS tablet   Commonly known as: VITAMIN D   Take 1,000 Units by mouth daily.      dexamethasone 4 MG tablet   Commonly known as: DECADRON   Take 1 tablet (4 mg total) by mouth 3 (three) times daily.      estradiol 0.5 MG tablet   Commonly known as: ESTRACE   Take 0.5 mg by mouth at bedtime.      HYDROcodone-acetaminophen 5-325 MG per tablet   Commonly known as: NORCO/VICODIN   Take 1 tablet by mouth every 4 (four) hours as needed  for pain.      medroxyPROGESTERone 2.5 MG tablet   Commonly known as: PROVERA   Take 2.5 mg by mouth at bedtime.      multivitamin with minerals Tabs   Take 1 tablet by mouth daily.      nystatin-triamcinolone cream   Commonly known as: MYCOLOG II   Apply 1 application topically daily.      omeprazole 40 MG capsule   Commonly known as: PRILOSEC   Take 40 mg by mouth daily.      pantoprazole 40 MG tablet   Commonly known as: PROTONIX   Take 40 mg by mouth daily.      simvastatin 40 MG tablet   Commonly known as: ZOCOR   Take 40 mg by mouth at bedtime.      tretinoin 0.025 % cream   Commonly known as: RETIN-A   Apply 1 application topically daily.         SignedClydene Fake, MD 12/20/2011, 8:03 AM

## 2011-12-20 NOTE — Plan of Care (Signed)
Problem: Consults Goal: Diagnosis - Craniotomy Craniotomy

## 2011-12-20 NOTE — Telephone Encounter (Signed)
C/D 12/20/11 for appt. 12/26/11 °

## 2011-12-21 ENCOUNTER — Encounter: Payer: Self-pay | Admitting: Radiation Oncology

## 2011-12-21 DIAGNOSIS — C719 Malignant neoplasm of brain, unspecified: Secondary | ICD-10-CM | POA: Insufficient documentation

## 2011-12-21 DIAGNOSIS — K449 Diaphragmatic hernia without obstruction or gangrene: Secondary | ICD-10-CM | POA: Insufficient documentation

## 2011-12-23 ENCOUNTER — Other Ambulatory Visit: Payer: Self-pay | Admitting: Oncology

## 2011-12-23 ENCOUNTER — Encounter: Payer: Self-pay | Admitting: Radiation Oncology

## 2011-12-23 NOTE — Progress Notes (Signed)
Radiation Oncology         (336) [phone] ________________________________  Initial outpatient Consultation  Name: Kelly Maxwell MRN: [id number]  Date: 12/24/2011  DOB: May 14, 1945  NU:UVOZDG,UYQIHK, MD  Levert Feinstein, MD   REFERRING PHYSICIAN: Levert Feinstein, MD  DIAGNOSIS: 66 year old woman s/p partial resection of a multifocal left temporal grade IV gliosarcoma  HISTORY OF PRESENT ILLNESS::Kelly Maxwell is a 66 y.o. female who has been in overall excellent health without any major medical or surgical illness. She has noticed a problem with her memory just in the last 2 or 3 weeks. She had trouble finding words and some of the words were not coming out right.  She was repeating some words. She had a single episode of a left parietal headache but other than that has not had any chronic or progressive headache. She denied any change in vision. No diplopia. No slurred speech. No focal weakness. There is a family history of strokes and she thought she might be having a minor stroke. She scheduled a visit with her internist. An MRI of the brain was done on 10/10 and unfortunately showed a ring-enhancing lesion in the left temporal lobe 2.7 x 3.6 x 2.5 cm with 4-5 surrounding satellite lesions. A CT scan of the chest, abdomen, and pelvis was done and was unrevealing for a primary source of tumor other than the brain. There was a left thyroid nodule 3.1x1.9 cm. She was referred to Dr. Phoebe Perch of neurosurgery. 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 She recovered from surgery with minimal disability.  Her aphasic symptoms improved.  A post-op MRI showed partial debulking of the tumor.  She was seen by Dr. Cyndie Chime in the hospital and has kindly been referred for radiation consultation.   Of note, the patient has stipulated to me emphatically that she does not want to the name of  her brain tumor or any details related to prognosis/survival projections.  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    PAST SURGICAL HISTORY: Past Surgical History  Procedure Date  . Total knee arthroplasty 2000    left  . Dilation and curettage of uterus   . Bunionectomy 2001    right  . Craniotomy 12/17/2011    Procedure: CRANIOTOMY TUMOR EXCISION;  Surgeon: Clydene Fake, MD;  Location: [facility];  Service: Neurosurgery;  Laterality: Left;  LEFT temporal craniotomy with stealth for tumor resection  . Dilation and curettage of uterus 2007    FAMILY HISTORY: family history is not on file.  SOCIAL HISTORY:  reports that she has never smoked. She does not have any smokeless tobacco history on file. She reports that she does not drink alcohol or use illicit drugs.  ALLERGIES: Codeine and Compazine  MEDICATIONS:  Current Outpatient Prescriptions  Medication Sig Dispense Refill  . aspirin EC 81 MG tablet Take 81 mg by mouth at bedtime.      Marland Kitchen buPROPion (WELLBUTRIN XL) 300 MG 24 hr tablet Take 300 mg by mouth every morning.      . Calcium Carbonate-Vitamin D (CALCIUM + D PO) Take 1 tablet by mouth 2 (two) times daily.      . cholecalciferol (VITAMIN D) 1000 UNITS tablet  Take 1,000 Units by mouth daily.      Marland Kitchen dexamethasone (DECADRON) 4 MG tablet Take 1 tablet (4 mg total) by mouth 3 (three) times daily.  50 tablet  1  . estradiol (ESTRACE) 0.5 MG tablet Take 0.5 mg by mouth at bedtime.      Marland Kitchen HYDROcodone-acetaminophen (NORCO/VICODIN) 5-325 MG per tablet Take 1 tablet by mouth every 4 (four) hours as needed for pain.  51 tablet  0  . medroxyPROGESTERone (PROVERA) 2.5 MG tablet Take 2.5 mg by mouth at bedtime.      . Multiple Vitamin  (MULTIVITAMIN WITH MINERALS) TABS Take 1 tablet by mouth daily.      Marland Kitchen nystatin-triamcinolone (MYCOLOG II) cream Apply 1 application topically daily.      Marland Kitchen omeprazole (PRILOSEC) 40 MG capsule Take 40 mg by mouth daily.      . pantoprazole (PROTONIX) 40 MG tablet Take 40 mg by mouth daily.      . simvastatin (ZOCOR) 40 MG tablet Take 40 mg by mouth at bedtime.      . tretinoin (RETIN-A) 0.025 % cream Apply 1 application topically daily.        REVIEW OF SYSTEMS:  A 15 point review of systems is documented in the electronic medical record. This was obtained by the nursing staff. However, I reviewed this with the patient to discuss relevant findings and make appropriate changes.  Pertinent items are noted in HPI.   PHYSICAL EXAM:  vitals were not taken for this visit.  She is neurologically grossly intact.  LABORATORY DATA:  Lab Results  Component Value Date   WBC 12.7* 12/17/2011   HGB 14.7 12/17/2011   HCT 42.5 12/17/2011   MCV 89.3 12/17/2011   PLT 336 12/17/2011   Lab Results  Component Value Date   NA 138 12/18/2011   K 4.0 12/18/2011   CL 107 12/18/2011   CO2 22 12/18/2011   No results found for this basename: ALT, AST, GGT, ALKPHOS, BILITOT     RADIOGRAPHY: Ct Head W Contrast  12/15/2011  *RADIOLOGY REPORT*  Clinical Data: Left temporal and frontal lobe mass.  Preoperative planing.  CT HEAD WITH CONTRAST  Technique:  Contiguous axial images were obtained from the base of the skull through the vertex with intravenous contrast  Contrast: 53mL OMNIPAQUE IOHEXOL 300 MG/ML  SOLN  Comparison: MRI of the brain without and with contrast 12/13/2011.  Findings: A left temporal lobe peripherally enhancing mass lesion measures 2.3 cm in long axis, slightly smaller than the previous measurements.  Extensive surrounding vasogenic edema is similar to the prior study.  Additional foci of enhancement within the anterior left frontal lobe are again noted.  No new lesions are evident.  Midline  shift is stable to slightly decreased, measuring 3.5 mm at the foramen of Monro.  IMPRESSION:  1.  Left temporal lobe mass measures slightly smaller than on the prior study.  This could represent a response to steroids. 2.  Additional lesions are again noted within the anterior left frontal lobe. 3.  Stable to slight decrease in midline shift.   Original Report Authenticated By: Resa Miner. MATTERN, M.D.    Ct Chest W Contrast  12/14/2011  *RADIOLOGY REPORT*  Clinical Data:  New diagnosis of left brain mass.  Evaluate for primary malignancy.  Nausea.  Nonsmoker.  CT CHEST, ABDOMEN AND PELVIS WITH CONTRAST  Technique:  Multidetector CT imaging of the chest, abdomen and pelvis was performed following the standard protocol during bolus administration of  intravenous contrast.  Contrast: 148mL OMNIPAQUE IOHEXOL 300 MG/ML  SOLN  Comparison:   None.  CT CHEST  Findings:  There is a dominant nodule in the left lobe of the thyroid, measuring 3.1 x 1.9 cm.  No pathologically enlarged mediastinal, hilar or axillary lymph nodes.  Heart size normal.  No pericardial effusion.  Small hiatal hernia.  Minimal scattered pulmonary parenchymal scarring and atelectasis, basilar predominant.  A 2 mm subpleural nodule in the left lower lobe (image 26) is very nonspecific.  No pleural fluid.  Airway is unremarkable.  IMPRESSION:  1.  No evidence of primary malignancy in the chest. 2.  Dominant left thyroid nodule. Consider further evaluation with thyroid ultrasound.  If patient is clinically hyperthyroid, consider nuclear medicine thyroid uptake and scan.  CT ABDOMEN AND PELVIS  Findings:  Liver, gallbladder and adrenal glands are unremarkable. Sub centimeter low attenuation lesion in the interpolar right kidney is too small to characterize.  Kidneys are otherwise unremarkable.  There may be a sub centimeter low attenuation lesion in the inferior aspect of the spleen, nonspecific.  Spleen, pancreas, stomach and small bowel are  otherwise unremarkable.  A fair amount of stool is seen in the colon.  Uterus and ovaries are visualized.  Left lateral ventral pelvic wall hernia contains fat and possibly omentum.  No pathologically enlarged lymph nodes.  No free fluid. Scattered atherosclerotic calcification of the arterial vasculature without abdominal aortic aneurysm.  No worrisome lytic or sclerotic lesions.  Degenerative changes are seen in the spine.  IMPRESSION:  1.  No evidence of primary malignancy in the abdomen or pelvis. 2.  Constipation.  3.  Left lateral ventral pelvic wall hernia contains fat and possibly omentum.   Original Report Authenticated By: Luretha Rued, M.D.    Mr Jeri Cos Wo Contrast  12/19/2011  *RADIOLOGY REPORT*  Clinical Data: Postoperative evaluation high-grade glioma.  MRI HEAD WITHOUT AND WITH CONTRAST  Technique:  Multiplanar, multiecho pulse sequences of the brain and surrounding structures were obtained according to standard protocol without and with intravenous contrast  Contrast: 91mL MULTIHANCE GADOBENATE DIMEGLUMINE 529 MG/ML IV SOLN  Comparison: 12/13/2011  Findings: The patient is status post left temporal craniotomy for biopsy and debulking of a left temporal lobe tumor found to represent a high-grade glioma.  There is less enhancement of the tumor within the surgical bed as compared with the preoperative examination.  Small satellite lesions surrounding the lesion in the left inferior frontal lobe, uncinate fasciculus, and left basal ganglia remain unchanged.  There is slight 4 mm left to right shift.   Slight left meningeal enhancement without significant extra-axial fluid.  No acute stroke.  Moderate vasogenic edema.  Slight left uncus displacement.  Major intracranial vascular structures patent.  No acute sinus or mastoid disease.  IMPRESSION: Satisfactory appearance status post left temporal craniotomy and debulking of primary lesion in the left temporal lobe representing high-grade glioma.   Satellite lesions in the left basal ganglia and left inferior frontal lobe unchanged. Moderate vasogenic edema with 4 mm left to right shift noted.   Original Report Authenticated By: Staci Righter, M.D.    Mr Jeri Cos Wo Contrast  12/13/2011  **ADDENDUM** CREATED: 12/13/2011 13:05:42  Critical Value/emergent results were called by telephone at the time of interpretation on 12/13/2011 at 1:07 p.m. to Dr. Deforest Hoyles, who verbally acknowledged these results.  **END ADDENDUM** SIGNED BY: Doran Heater. Jeannine Kitten, M.D.   12/13/2011  *RADIOLOGY REPORT*  Clinical Data: Difficulty finding words.  Memory problems.  MRI HEAD WITHOUT AND WITH CONTRAST  Technique:  Multiplanar, multiecho pulse sequences of the brain and surrounding structures were obtained according to standard protocol without and with intravenous contrast  Contrast: 76mL MULTIHANCE GADOBENATE DIMEGLUMINE 529 MG/ML IV SOLN  Comparison: None.  Findings: Anterior left temporal lobe 2.7 x 3.6 x 2.5 cm irregular peripheral enhancing mass with marked surrounding vasogenic edema with expansion of the left temporal lobe distorting the sylvian fissure and causing compression of the left lateral ventricle with 5.8 mm of midline shift to the right.  This displaces adjacent left middle cerebral artery vessels. Left uncus projects slightly into the suprasellar cistern.  Flattening of the left cerebral peduncle. Findings highly suspicious for malignancy.  There is an area of focal restriction within the central aspect of the lesion however the appearance is not typical for an abscess.  Additionally, there are 4-5 rounded enhancing lesions in a confluent fashion within the anterior inferior left frontal lobe (anterior and medial to the temporal lobe lesion) measuring between 4 and 11 mm.  There is a prominent vessel extending through these enhancing lesions which may represent an incidentally detected developmental venous anomaly rather than vascular supply to the tumor.   Anterior limb left internal capsule 6 mm enhancing lesion.  The multiplicity raises possibility of intracranial metastatic disease.  Primary brain tumor with daughter lesions such as can be seen with glioblastoma is also a consideration.  Infection is a less likely consideration.  No evidence of cerebellar or right hemispheric lesion.  No acute thrombotic infarct.  No intracranial hemorrhage.  Major intracranial vascular structures are patent.  IMPRESSION: Left temporal lobe and anterior left frontal lobe enhancing lesions as detailed above with surrounding vasogenic edema causing compression of the left lateral ventricle and 5.8 mm of midline shift to the right as detailed above.  This may represent result of metastatic disease or primary brain tumor such as glioblastoma multiforme.  Infection felt to be a secondary less likely consideration.  Presently unable to contact Dr. Deforest Hoyles.  This has been made a PRA call report utilizing dashboard call feature.   Original Report Authenticated By: Doug Sou, M.D.    Ct Abdomen Pelvis W Contrast  12/14/2011  *RADIOLOGY REPORT*  Clinical Data:  New diagnosis of left brain mass.  Evaluate for primary malignancy.  Nausea.  Nonsmoker.  CT CHEST, ABDOMEN AND PELVIS WITH CONTRAST  Technique:  Multidetector CT imaging of the chest, abdomen and pelvis was performed following the standard protocol during bolus administration of intravenous contrast.  Contrast: 190mL OMNIPAQUE IOHEXOL 300 MG/ML  SOLN  Comparison:   None.  CT CHEST  Findings:  There is a dominant nodule in the left lobe of the thyroid, measuring 3.1 x 1.9 cm.  No pathologically enlarged mediastinal, hilar or axillary lymph nodes.  Heart size normal.  No pericardial effusion.  Small hiatal hernia.  Minimal scattered pulmonary parenchymal scarring and atelectasis, basilar predominant.  A 2 mm subpleural nodule in the left lower lobe (image 26) is very nonspecific.  No pleural fluid.  Airway is unremarkable.   IMPRESSION:  1.  No evidence of primary malignancy in the chest. 2.  Dominant left thyroid nodule. Consider further evaluation with thyroid ultrasound.  If patient is clinically hyperthyroid, consider nuclear medicine thyroid uptake and scan.  CT ABDOMEN AND PELVIS  Findings:  Liver, gallbladder and adrenal glands are unremarkable. Sub centimeter low attenuation lesion in the interpolar right kidney is too small to characterize.  Kidneys  are otherwise unremarkable.  There may be a sub centimeter low attenuation lesion in the inferior aspect of the spleen, nonspecific.  Spleen, pancreas, stomach and small bowel are otherwise unremarkable.  A fair amount of stool is seen in the colon.  Uterus and ovaries are visualized.  Left lateral ventral pelvic wall hernia contains fat and possibly omentum.  No pathologically enlarged lymph nodes.  No free fluid. Scattered atherosclerotic calcification of the arterial vasculature without abdominal aortic aneurysm.  No worrisome lytic or sclerotic lesions.  Degenerative changes are seen in the spine.  IMPRESSION:  1.  No evidence of primary malignancy in the abdomen or pelvis. 2.  Constipation.  3.  Left lateral ventral pelvic wall hernia contains fat and possibly omentum.   Original Report Authenticated By: Reyes Ivan, M.D.       IMPRESSION:  Ms. Briddell is a very nice 66 year old woman status post partial resection of a multifocal left temporal grade IV gliosarcoma.  At this point, she would benefit from adjuvant radiotherapy with concurrent Temodar to delay tumor recurrence/progression for quality of life and prolongation of survival.  PLAN: Today, I talked to the patient and family about the findings and work-up thus far.  We skipped the usual discussion related to the natural history of disease and stuck to discussing general treatment, highlighting the role or radiotherapy in the management.  We discussed the available radiation techniques, and focused on the  details of logistics and delivery.  We reviewed the anticipated acute and late sequelae associated with radiation in this setting.  The patient was encouraged to ask questions that I answered to the best of my ability.  I filled out a patient counseling form during our discussion including treatment diagrams.  We retained a copy for our records.  The patient would like to proceed with radiation and will be scheduled for CT simulation.  I spent 60 minutes minutes face to face with the patient and more than 50% of that time was spent in counseling and/or coordination of care.    ------------------------------------------------  Artist Pais. Kathrynn Running, M.D.

## 2011-12-23 NOTE — Patient Instructions (Addendum)
The standard treatment is maximal tumor removal, followed by radiation and temodar, then additional chemotherapy.

## 2011-12-24 ENCOUNTER — Ambulatory Visit
Admission: RE | Admit: 2011-12-24 | Discharge: 2011-12-24 | Disposition: A | Discharge status: Home / Self Care | Payer: Medicare Other | Source: Ambulatory Visit | Attending: Radiation Oncology | Admitting: Radiation Oncology

## 2011-12-24 ENCOUNTER — Encounter: Payer: Self-pay | Admitting: Radiation Oncology

## 2011-12-24 VITALS — BP 148/94 | HR 103 | Temp 98.2°F | Wt 158.2 lb

## 2011-12-24 DIAGNOSIS — Z510 Encounter for antineoplastic radiation therapy: Secondary | ICD-10-CM | POA: Insufficient documentation

## 2011-12-24 DIAGNOSIS — D496 Neoplasm of unspecified behavior of brain: Secondary | ICD-10-CM

## 2011-12-24 DIAGNOSIS — C712 Malignant neoplasm of temporal lobe: Secondary | ICD-10-CM | POA: Insufficient documentation

## 2011-12-24 DIAGNOSIS — E785 Hyperlipidemia, unspecified: Secondary | ICD-10-CM | POA: Insufficient documentation

## 2011-12-24 DIAGNOSIS — Z79899 Other long term (current) drug therapy: Secondary | ICD-10-CM | POA: Insufficient documentation

## 2011-12-24 DIAGNOSIS — K219 Gastro-esophageal reflux disease without esophagitis: Secondary | ICD-10-CM | POA: Insufficient documentation

## 2011-12-24 MED ORDER — LORAZEPAM 1 MG PO TABS: 1.0000 mg | ORAL_TABLET | Freq: Three times a day (TID) | ORAL | Status: DC

## 2011-12-24 NOTE — Addendum Note (Signed)
Encounter addended by: Delynn Flavin, RN on: 12/24/2011  7:16 PM<BR>     Documentation filed: Charges VN

## 2011-12-24 NOTE — Progress Notes (Signed)
Please see the Nurse Progress Note in the MD Initial Consult Encounter for this patient. 

## 2011-12-24 NOTE — Addendum Note (Signed)
Encounter addended by: Glennie Hawk, RN on: 12/24/2011  4:16 PM<BR>     Documentation filed: Charges VN

## 2011-12-24 NOTE — Progress Notes (Signed)
Patient and husband here for radiation consultation for newly diagnosed glioblastoma post completion of partial tumor resection by Dr.Hirsch.Dexamethasone has been decreased from 4 mg to 1/2 tab three times daily.

## 2011-12-26 ENCOUNTER — Encounter: Payer: Self-pay | Admitting: Oncology

## 2011-12-26 ENCOUNTER — Ambulatory Visit
Admission: RE | Admit: 2011-12-26 | Discharge: 2011-12-26 | Disposition: A | Discharge status: Home / Self Care | Payer: Medicare Other | Source: Ambulatory Visit | Attending: Radiation Oncology | Admitting: Radiation Oncology

## 2011-12-26 ENCOUNTER — Encounter: Payer: Self-pay | Admitting: Radiation Oncology

## 2011-12-26 ENCOUNTER — Ambulatory Visit: Payer: Medicare Other | Admitting: Radiation Oncology

## 2011-12-26 ENCOUNTER — Ambulatory Visit (HOSPITAL_BASED_OUTPATIENT_CLINIC_OR_DEPARTMENT_OTHER): Payer: Medicare Other | Admitting: Oncology

## 2011-12-26 ENCOUNTER — Telehealth: Payer: Self-pay | Admitting: *Deleted

## 2011-12-26 ENCOUNTER — Ambulatory Visit: Payer: Medicare Other

## 2011-12-26 VITALS — BP 158/97 | HR 115 | Temp 99.4°F | Resp 20 | Ht 64.0 in | Wt 157.5 lb

## 2011-12-26 DIAGNOSIS — D496 Neoplasm of unspecified behavior of brain: Secondary | ICD-10-CM

## 2011-12-26 DIAGNOSIS — C712 Malignant neoplasm of temporal lobe: Secondary | ICD-10-CM

## 2011-12-26 NOTE — Progress Notes (Deleted)
  Radiation Oncology         (336) [phone] ________________________________  Name: RAFEEF LAU MRN: [id number]  Date: 12/26/2011  DOB: Nov 13, 1945  SIMULATION AND TREATMENT PLANNING NOTE  DIAGNOSIS:  66 yo woman with multifocal left temporal grade IV glioma (gliosarcoma)  NARRATIVE:  The patient was brought to the CT Simulation planning suite.  Identity was confirmed.  All relevant records and images related to the planned course of therapy were reviewed.  The patient freely provided informed written consent to proceed with treatment after reviewing the details related to the planned course of therapy. The consent form was witnessed and verified by the simulation staff.  Then, the patient was set-up in a stable reproducible  supine position for radiation therapy.  CT images were obtained.  Surface markings were placed.  The CT images were loaded into the planning software.  Then the target and avoidance structures were contoured.  Treatment planning then occurred.  The radiation prescription was entered and confirmed.  A total of one complex treatment device was fabricated. I have requested : Intensity Modulated Radiotherapy (IMRT) is medically necessary for this case for the following reason:  Critical CNS structure avoidance - brainstem, optic chiasm, optic nerve..  I have ordered:CBC  PLAN:  The patient will receive 60 Gy partial brain in 30 fractions with a field reduction.  ________________________________  Artist Pais Kathrynn Running, M.D.

## 2011-12-26 NOTE — Progress Notes (Signed)
Checked in patient for the doctor's follow up.

## 2011-12-26 NOTE — Telephone Encounter (Signed)
Received call from pt's son, Kelly Maxwell wondering if it is possible for family to come to today's appt without pt.  He states that her husband died with glioblastoma & pt has not heard the word glioblastoma but knows she has a brain tumor but really doesn't want to do anything & the family is OK with this. Will discuss with Dr Cyndie Chime & call son back [phone]-mother's ph #.

## 2011-12-27 ENCOUNTER — Telehealth: Payer: Self-pay | Admitting: Oncology

## 2011-12-27 NOTE — Telephone Encounter (Signed)
Talked to pt's son, gave him appt for 01/02/12 MD only

## 2011-12-27 NOTE — Progress Notes (Signed)
Hematology and Oncology Follow Up Visit  Kelly Maxwell [id number] 1945-08-06 66 y.o. 12/27/2011 8:41 AM   Principle Diagnosis: Encounter Diagnoses  Name Primary?  . Glial neoplasm of brain Yes  . Brain tumor      Interim History:   Short interval followup visit for this 66 year old woman I recently saw in consultation at [facility] on October 14 for a newly diagnosed brain cancer. Please see my consultation note for full details.  She had a subtotal resection of a dominant mass. Residual satellite lesions left behind. She did extremely well with surgery. She has no neurologic deficits. She is overall healthy, appears much younger than her stated age, and has an excellent performance status.  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  She is here with her family today to discuss further management. In the antrum she has been evaluated by radiation oncology, [name], who has recommended standard radiation/chemotherapy Temodar chemotherapy.  Medications: reviewed  Allergies:  Allergies  Allergen Reactions  . Codeine Nausea And Vomiting  . Compazine (Prochlorperazine Edisylate) Other (See Comments)    Became hyper      Physical Exam: Blood pressure 158/97, pulse 115, temperature 99.4 F (37.4 C), temperature source Oral, resp. rate 20, height 5\' 4"  (1.626 m), weight 157 lb 8 oz (71.442 kg). Wt Readings from Last 3 Encounters:  12/26/11 157 lb 8 oz (71.442 kg)  12/24/11 158 lb 3.2 oz (71.759 kg)  12/20/11 167 lb 1.7 oz (75.8 kg)   Complete exam done in the hospital last week. Today's exam limited to the neurologic. Neurologic: she is alert and oriented. Pupils equal round reactive to light. Optic discs sharp. Vessels normal. No hemorrhage or exudate. Healing wound left parietal region of the scalp.     Lab Results: Lab Results  Component Value Date   WBC 12.7* 12/17/2011   HGB 14.7 12/17/2011   HCT 42.5 12/17/2011   MCV 89.3 12/17/2011   PLT 336 12/17/2011     Chemistry      Component Value Date/Time   NA 138 12/18/2011 0440   K 4.0 12/18/2011 0440   CL 107 12/18/2011 0440   CO2 22 12/18/2011 0440   BUN 17 12/18/2011 0440   CREATININE 0.50 12/18/2011 0440   CREATININE 0.50 12/12/2011 0000      Component Value Date/Time   CALCIUM 8.7 12/18/2011 0440       Radiological Studies: Ct Head W Contrast  12/15/2011  *RADIOLOGY REPORT*  Clinical Data: Left temporal and frontal lobe mass.  Preoperative planing.  CT HEAD WITH CONTRAST  Technique:  Contiguous axial images were obtained from the base of the skull through the vertex with intravenous contrast  Contrast: 50mL OMNIPAQUE IOHEXOL 300 MG/ML  SOLN  Comparison: MRI of the brain without and with contrast 12/13/2011.  Findings: A left temporal lobe peripherally enhancing mass lesion measures 2.3 cm in long axis, slightly smaller than the previous measurements.  Extensive surrounding vasogenic edema is similar to the prior study.  Additional foci of enhancement within the anterior left frontal lobe are again noted.  No new lesions are evident.  Midline shift is stable to slightly decreased, measuring 3.5 mm at the foramen of Monro.  IMPRESSION:  1.  Left temporal lobe mass measures slightly smaller than on the prior study.  This could represent a response to steroids. 2.  Additional lesions are again noted within the anterior left frontal lobe. 3.  Stable to slight  decrease in midline shift.   Original Report Authenticated By: Resa Miner. MATTERN, M.D.    Ct Chest W Contrast  12/14/2011  *RADIOLOGY REPORT*  Clinical Data:  New diagnosis of left brain mass.  Evaluate for primary malignancy.  Nausea.  Nonsmoker.  CT CHEST, ABDOMEN AND PELVIS WITH CONTRAST  Technique:  Multidetector CT imaging of the chest, abdomen and pelvis was performed following the standard protocol during bolus administration of intravenous contrast.  Contrast: 151mL OMNIPAQUE IOHEXOL 300  MG/ML  SOLN  Comparison:   None.  CT CHEST  Findings:  There is a dominant nodule in the left lobe of the thyroid, measuring 3.1 x 1.9 cm.  No pathologically enlarged mediastinal, hilar or axillary lymph nodes.  Heart size normal.  No pericardial effusion.  Small hiatal hernia.  Minimal scattered pulmonary parenchymal scarring and atelectasis, basilar predominant.  A 2 mm subpleural nodule in the left lower lobe (image 26) is very nonspecific.  No pleural fluid.  Airway is unremarkable.  IMPRESSION:  1.  No evidence of primary malignancy in the chest. 2.  Dominant left thyroid nodule. Consider further evaluation with thyroid ultrasound.  If patient is clinically hyperthyroid, consider nuclear medicine thyroid uptake and scan.  CT ABDOMEN AND PELVIS  Findings:  Liver, gallbladder and adrenal glands are unremarkable. Sub centimeter low attenuation lesion in the interpolar right kidney is too small to characterize.  Kidneys are otherwise unremarkable.  There may be a sub centimeter low attenuation lesion in the inferior aspect of the spleen, nonspecific.  Spleen, pancreas, stomach and small bowel are otherwise unremarkable.  A fair amount of stool is seen in the colon.  Uterus and ovaries are visualized.  Left lateral ventral pelvic wall hernia contains fat and possibly omentum.  No pathologically enlarged lymph nodes.  No free fluid. Scattered atherosclerotic calcification of the arterial vasculature without abdominal aortic aneurysm.  No worrisome lytic or sclerotic lesions.  Degenerative changes are seen in the spine.  IMPRESSION:  1.  No evidence of primary malignancy in the abdomen or pelvis. 2.  Constipation.  3.  Left lateral ventral pelvic wall hernia contains fat and possibly omentum.   Original Report Authenticated By: Luretha Rued, M.D.    Mr Jeri Cos Wo Contrast  12/19/2011  *RADIOLOGY REPORT*  Clinical Data: Postoperative evaluation high-grade glioma.  MRI HEAD WITHOUT AND WITH CONTRAST  Technique:   Multiplanar, multiecho pulse sequences of the brain and surrounding structures were obtained according to standard protocol without and with intravenous contrast  Contrast: 51mL MULTIHANCE GADOBENATE DIMEGLUMINE 529 MG/ML IV SOLN  Comparison: 12/13/2011  Findings: The patient is status post left temporal craniotomy for biopsy and debulking of a left temporal lobe tumor found to represent a high-grade glioma.  There is less enhancement of the tumor within the surgical bed as compared with the preoperative examination.  Small satellite lesions surrounding the lesion in the left inferior frontal lobe, uncinate fasciculus, and left basal ganglia remain unchanged.  There is slight 4 mm left to right shift.   Slight left meningeal enhancement without significant extra-axial fluid.  No acute stroke.  Moderate vasogenic edema.  Slight left uncus displacement.  Major intracranial vascular structures patent.  No acute sinus or mastoid disease.  IMPRESSION: Satisfactory appearance status post left temporal craniotomy and debulking of primary lesion in the left temporal lobe representing high-grade glioma.  Satellite lesions in the left basal ganglia and left inferior frontal lobe unchanged. Moderate vasogenic edema with 4 mm left to right  shift noted.   Original Report Authenticated By: Staci Righter, M.D.    Mr Jeri Cos Wo Contrast  12/13/2011  **ADDENDUM** CREATED: 12/13/2011 13:05:42  Critical Value/emergent results were called by telephone at the time of interpretation on 12/13/2011 at 1:07 p.m. to Dr. Deforest Hoyles, who verbally acknowledged these results.  **END ADDENDUM** SIGNED BY: Doran Heater. Jeannine Kitten, M.D.   12/13/2011  *RADIOLOGY REPORT*  Clinical Data: Difficulty finding words.  Memory problems.  MRI HEAD WITHOUT AND WITH CONTRAST  Technique:  Multiplanar, multiecho pulse sequences of the brain and surrounding structures were obtained according to standard protocol without and with intravenous contrast  Contrast: 9mL  MULTIHANCE GADOBENATE DIMEGLUMINE 529 MG/ML IV SOLN  Comparison: None.  Findings: Anterior left temporal lobe 2.7 x 3.6 x 2.5 cm irregular peripheral enhancing mass with marked surrounding vasogenic edema with expansion of the left temporal lobe distorting the sylvian fissure and causing compression of the left lateral ventricle with 5.8 mm of midline shift to the right.  This displaces adjacent left middle cerebral artery vessels. Left uncus projects slightly into the suprasellar cistern.  Flattening of the left cerebral peduncle. Findings highly suspicious for malignancy.  There is an area of focal restriction within the central aspect of the lesion however the appearance is not typical for an abscess.  Additionally, there are 4-5 rounded enhancing lesions in a confluent fashion within the anterior inferior left frontal lobe (anterior and medial to the temporal lobe lesion) measuring between 4 and 11 mm.  There is a prominent vessel extending through these enhancing lesions which may represent an incidentally detected developmental venous anomaly rather than vascular supply to the tumor.  Anterior limb left internal capsule 6 mm enhancing lesion.  The multiplicity raises possibility of intracranial metastatic disease.  Primary brain tumor with daughter lesions such as can be seen with glioblastoma is also a consideration.  Infection is a less likely consideration.  No evidence of cerebellar or right hemispheric lesion.  No acute thrombotic infarct.  No intracranial hemorrhage.  Major intracranial vascular structures are patent.  IMPRESSION: Left temporal lobe and anterior left frontal lobe enhancing lesions as detailed above with surrounding vasogenic edema causing compression of the left lateral ventricle and 5.8 mm of midline shift to the right as detailed above.  This may represent result of metastatic disease or primary brain tumor such as glioblastoma multiforme.  Infection felt to be a secondary less likely  consideration.  Presently unable to contact Dr. Deforest Hoyles.  This has been made a PRA call report utilizing dashboard call feature.   Original Report Authenticated By: Doug Sou, M.D.    Ct Abdomen Pelvis W Contrast  12/14/2011  *RADIOLOGY REPORT*  Clinical Data:  New diagnosis of left brain mass.  Evaluate for primary malignancy.  Nausea.  Nonsmoker.  CT CHEST, ABDOMEN AND PELVIS WITH CONTRAST  Technique:  Multidetector CT imaging of the chest, abdomen and pelvis was performed following the standard protocol during bolus administration of intravenous contrast.  Contrast: 180mL OMNIPAQUE IOHEXOL 300 MG/ML  SOLN  Comparison:   None.  CT CHEST  Findings:  There is a dominant nodule in the left lobe of the thyroid, measuring 3.1 x 1.9 cm.  No pathologically enlarged mediastinal, hilar or axillary lymph nodes.  Heart size normal.  No pericardial effusion.  Small hiatal hernia.  Minimal scattered pulmonary parenchymal scarring and atelectasis, basilar predominant.  A 2 mm subpleural nodule in the left lower lobe (image 26) is very nonspecific.  No pleural  fluid.  Airway is unremarkable.  IMPRESSION:  1.  No evidence of primary malignancy in the chest. 2.  Dominant left thyroid nodule. Consider further evaluation with thyroid ultrasound.  If patient is clinically hyperthyroid, consider nuclear medicine thyroid uptake and scan.  CT ABDOMEN AND PELVIS  Findings:  Liver, gallbladder and adrenal glands are unremarkable. Sub centimeter low attenuation lesion in the interpolar right kidney is too small to characterize.  Kidneys are otherwise unremarkable.  There may be a sub centimeter low attenuation lesion in the inferior aspect of the spleen, nonspecific.  Spleen, pancreas, stomach and small bowel are otherwise unremarkable.  A fair amount of stool is seen in the colon.  Uterus and ovaries are visualized.  Left lateral ventral pelvic wall hernia contains fat and possibly omentum.  No pathologically enlarged lymph  nodes.  No free fluid. Scattered atherosclerotic calcification of the arterial vasculature without abdominal aortic aneurysm.  No worrisome lytic or sclerotic lesions.  Degenerative changes are seen in the spine.  IMPRESSION:  1.  No evidence of primary malignancy in the abdomen or pelvis. 2.  Constipation.  3.  Left lateral ventral pelvic wall hernia contains fat and possibly omentum.   Original Report Authenticated By: Reyes Ivan, M.D.    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 Microscopic Comment Sections show a high grade glial neoplasm with a sarcomatous appearing component. An immunohistochemical stain for GFAP is obtained which shows positivity in the glial component with lack of staining in the more sarcomatous component. Marked atypia, necrosis and mitoses are present. The morphology coupled with the immunohistochemical staining pattern is consistent with a high grade glial neoplasm (WHO Grade IV) and is compatible with a gliosarcoma. Dr. Laureen Ochs has seen this case in consultation with agreement. (RAH:caf 12/19/11) Zandra Abts MD Pathologist, Electronic Signature (Case signed 12/19/2011) Intraoperative Diagnosis LEFT TEMPORAL TUMOR, EXCISION, FROZEN SECTION: MALIGNANT. HIGH GRADE GLIOMA (CRR) Specimen Gross and Clinical Information Specimen(s) Obtained: Brain, for tumor resection, Left temporal Specimen Clinical Information Brain tumor (tl) Gross Received fresh is a 2.1 x 1.9 x 1.8 cm well defined soft rubbery mass with tan yellow to red centrally friable cut surfaces. A section is submitted in one block labeled A for frozen section, with additional sections submitted in blocks B,C for routine histology. Total 3 blocks. (SW:gt, 12/18/11) 1 of 2 FINAL for NANDA, BITTICK A [phone]) Stain(s) used in Diagnosis: The following stain(s) were used in diagnosing  the case: GFAP. The control(s) stained appropriately. Disclaimer Some of these immunohistochemical stains may have been developed and the performance characteristics determined by [facility]. Some may not have been cleared or approved by the U.S. Food and Drug Administration. The FDA has determined that such clearance or approval is not necessary. This test is used for clinical purposes. It should not be regarded as investigational or for research. This laboratory is certified under the Clinical Laboratory Improvement Amendments of 1988 (CLIA-88) as qualified to perform high complexity clinical laboratory testing. Report signed out from the following location(s) [facility] [address],[place] [address].CLIA:[id number],CAP:[id number]., [facility] [address], [place], [place] [address]. CLIA #: [id number],   Impression and Plan:  Glioma sarcoma left temporal lobe  I spent about an hour and a half with the patient, her second husband, her son, and her daughter from her first marriage. Unfortunately, her first husband died of a glioblastoma and  they had a very bad experience.  I researched the availability of local clinical trials. The trial that is most attractive is offered both at [facility] and at [facility] in [place]. This involves standard therapy which can be given locally in [place] and then the addition of a vaccine directed against EGFR which will be given at time of completion of initial chemoradiation and then monthly until progression. The control group gets a nonspecific immunostimulatory protein -- KLH. [facility] also has a clinical trial looking at Avastin as part of initial therapy. This trial has been ongoing for the last few years and I have shared a number of patients with them. Two recent phase 3 mature clinical trials have shown a modest improvement in progression free survival of about 3 months but no  improvement in overall survival and a significant amount of toxicity so that it is not recommended that Avastin is used outside of a clinical trial.  The family is interested in a wider clinical trial search and states that the patient would go anywhere to take anything. I told them that this may not be practical. She has a high-grade tumor with residual disease and really should be on treatment sooner rather than later. I told them  I would do an additional clinical trial search for them and also gave them some websites to go to.  The patient's daughter-in-law is a physician Eveline Keto and will also assist the family in finalizing a treatment plan. [email or web address]  I'm going to get her ready to be treated with Temodar 75 mg per meter squared, body surface area 1.8 m, a total dose 135 mg daily during radiation. Prophylactic Septra double strength tablets one oral Monday Wednesday Friday, her prolactin Valtrex 500 mg one daily. She is very concerned about potential nausea and vomiting. I'm also prescribing Zofran 8 mg sublingual tablets take one 30 minutes before chemotherapy then one every 6 hours when necessary nausea or vomiting.   CC:. Dr. Tyson Dense; Dr. Metro Kung; Dr. Margaretmary Dys   Levert Feinstein, MD 10/24/20138:41 AM

## 2011-12-31 ENCOUNTER — Telehealth: Payer: Self-pay | Admitting: *Deleted

## 2011-12-31 NOTE — Telephone Encounter (Signed)
Spoke w/pt's son, Daron Offer earlier today re: pt's ct sim appt. Pt and family will come in 20 min before sim to speak w/Dr Kathrynn Running w/dr's approval.

## 2011-12-31 NOTE — Telephone Encounter (Signed)
CALLED PATIENT TO INFORM OF SIM APPT. FOR 01-02-12 AT 11:00 AM, LVM FOR A RETURN CALL

## 2012-01-01 ENCOUNTER — Other Ambulatory Visit: Payer: Self-pay | Admitting: *Deleted

## 2012-01-01 DIAGNOSIS — D496 Neoplasm of unspecified behavior of brain: Secondary | ICD-10-CM

## 2012-01-01 MED ORDER — ONDANSETRON 8 MG PO TBDP: 8.0000 mg | ORAL_TABLET | Freq: Three times a day (TID) | ORAL | Status: DC | PRN

## 2012-01-01 MED ORDER — SULFAMETHOXAZOLE-TRIMETHOPRIM 800-160 MG PO TABS: ORAL_TABLET | ORAL | Status: DC

## 2012-01-01 MED ORDER — VALACYCLOVIR HCL 500 MG PO TABS: 500.0000 mg | ORAL_TABLET | Freq: Every day | ORAL | Status: DC

## 2012-01-02 ENCOUNTER — Encounter: Payer: Self-pay | Admitting: Oncology

## 2012-01-02 ENCOUNTER — Ambulatory Visit
Admission: RE | Admit: 2012-01-02 | Discharge: 2012-01-02 | Disposition: A | Discharge status: Home / Self Care | Payer: Medicare Other | Source: Ambulatory Visit | Attending: Radiation Oncology | Admitting: Radiation Oncology

## 2012-01-02 ENCOUNTER — Ambulatory Visit (HOSPITAL_BASED_OUTPATIENT_CLINIC_OR_DEPARTMENT_OTHER): Payer: Medicare Other | Admitting: Oncology

## 2012-01-02 ENCOUNTER — Ambulatory Visit
Admission: RE | Admit: 2012-01-02 | Discharge: 2012-01-02 | Disposition: A | Discharge status: Home / Self Care | Payer: Medicare Other | Source: Ambulatory Visit | Admitting: Radiation Oncology

## 2012-01-02 ENCOUNTER — Encounter: Payer: Self-pay | Admitting: Radiation Oncology

## 2012-01-02 ENCOUNTER — Telehealth: Payer: Self-pay | Admitting: Oncology

## 2012-01-02 VITALS — BP 159/93 | HR 113 | Temp 99.0°F | Resp 20 | Ht 64.0 in | Wt 158.7 lb

## 2012-01-02 DIAGNOSIS — D496 Neoplasm of unspecified behavior of brain: Secondary | ICD-10-CM

## 2012-01-02 DIAGNOSIS — F329 Major depressive disorder, single episode, unspecified: Secondary | ICD-10-CM | POA: Insufficient documentation

## 2012-01-02 DIAGNOSIS — Z9889 Other specified postprocedural states: Secondary | ICD-10-CM

## 2012-01-02 DIAGNOSIS — C712 Malignant neoplasm of temporal lobe: Secondary | ICD-10-CM

## 2012-01-02 DIAGNOSIS — F32A Depression, unspecified: Secondary | ICD-10-CM | POA: Insufficient documentation

## 2012-01-02 DIAGNOSIS — R112 Nausea with vomiting, unspecified: Secondary | ICD-10-CM | POA: Insufficient documentation

## 2012-01-02 DIAGNOSIS — K219 Gastro-esophageal reflux disease without esophagitis: Secondary | ICD-10-CM | POA: Insufficient documentation

## 2012-01-02 DIAGNOSIS — E785 Hyperlipidemia, unspecified: Secondary | ICD-10-CM | POA: Insufficient documentation

## 2012-01-02 MED ORDER — LORAZEPAM 1 MG PO TABS
1.0000 mg | ORAL_TABLET | ORAL | Status: AC
  Administered 2012-01-02: 1 mg via ORAL
  Filled 2012-01-02: qty 1

## 2012-01-02 NOTE — Progress Notes (Signed)
Faxed pa form to [employer] [place] for ondansetron.

## 2012-01-02 NOTE — Progress Notes (Signed)
Short interval followup visit for this 66 year old woman recently diagnosed with a gliaosarcoma of the left temporal lobe. Primary tumor grossly resected. Multiple surrounding satellite lesions unresectable. I had a lengthy 2 hour interview with the patient and her family last week. I advised a standard chemotherapy radiation therapy  program and also thought she would be a excellent candidate for a current vaccine trial which is active at both [facility] and at [facility] looking at a EGFR-based vaccine. Family wanted me to research additional clinical trials. I did a literature search on the NCI data base and identified a number of other trials. Although some of these trials are reasonable, all are out of state and I don't think that they offer anything worth traveling for compared with what is available in [place]. All of these studies either add another agent to the team about chemotherapy including dasatinib, lapatinib, alpha interferon, bortezomib, or a TGF. beta inhibitor or apply a alternative vaccine strategy. She is not a candidate for a dendritic vaccine since her  tumor has already been resected.  We spent another hour discussing these possibilities. Family informed me that they have made contact with [facility] and will be seeing Dr. Carolanne Grumbling. I told them that I work closely with the neuro oncologists at [facility] and encouraged them to get their second opinion as soon as possible so that we can begin treatment.  Patient has a main concern about nausea. I am recommending Zofran sublingual 8 mg  30 minutes before taking her daily Temodar dose then every 8 hours as needed after that. She does have a history of  intolerance to Compazine but there are other antiemetics that we can add if we need to.  We did make arrangements to get  Temodar for her. Weight 158 pounds, height 5 feet 4 inches, body surface area 1.8 m. Initial dose 75 mg per meter squared total dose 135 mg to take daily  during radiation.subsequent dose elevation at time of maintenance.   She is to followup with radiation oncology, Dr. Margaretmary Bayley today. We will coordinate efforts.  The best thing about the vaccine trial is that it allows her to take standard treatment locally. The vaccine doesn't start until the completion of the initial chemoradiation.  We got into a long discussion about the current status of MGMT testing. This will be done routinely as part of the clinical trial as well as EGFR testing. The patient's daughter wants to see if we can get the MGMT. testing done sooner. It does appear that MGMT. methylation status is clearly related to response to Temodar. I will ask our pathologists if they can process tissue for this analysis.  Daughter felt that her mom was developing some thrush. She is still on low-dose dexamethasone. Rather than add another pill to the mix, I'm going to put her on Mycelex troches.  I will schedule her for weekly lab studies while she is on treatment.

## 2012-01-02 NOTE — Progress Notes (Signed)
  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 Identity was confirmed. All relevant records and images related to the planned course of therapy were reviewed. The patient freely provided informed written consent to proceed with treatment after reviewing the details related to the planned course of therapy. The consent form was witnessed and verified by the simulation staff. Then, the patient was set-up in a stable reproducible supine position for radiation therapy. CT images were obtained. Surface markings were placed. The CT images were loaded into the planning software. Then the target and avoidance structures were contoured. Treatment planning then occurred. The radiation prescription was entered and confirmed. A total of one complex treatment device was fabricated. I have requested : Intensity Modulated Radiotherapy (IMRT) is medically necessary for this case for the following reason: Critical CNS structure avoidance - brainstem, optic chiasm, optic nerve.. I have ordered:CBC per med-onc  PLAN: The patient will receive 60 Gy partial brain in 30 fractions with a field reduction.  ________________________________  Artist Pais Kathrynn Running, M.D.

## 2012-01-02 NOTE — Progress Notes (Signed)
Gave pt Ativan 1 mg SL per Dr Broadus John order prior to ct sim.

## 2012-01-02 NOTE — Telephone Encounter (Signed)
Talked to pt and gave her appt for whole month of November 2013 lab and MD

## 2012-01-03 ENCOUNTER — Encounter: Payer: Self-pay | Admitting: Oncology

## 2012-01-03 ENCOUNTER — Other Ambulatory Visit: Payer: Self-pay | Admitting: *Deleted

## 2012-01-03 ENCOUNTER — Encounter: Payer: Self-pay | Admitting: *Deleted

## 2012-01-03 ENCOUNTER — Encounter: Payer: Self-pay | Admitting: Radiation Oncology

## 2012-01-03 DIAGNOSIS — B370 Candidal stomatitis: Secondary | ICD-10-CM

## 2012-01-03 MED ORDER — CLOTRIMAZOLE 10 MG MT LOZG: 10.0000 mg | LOZENGE | Freq: Three times a day (TID) | OROMUCOSAL | Status: DC

## 2012-01-03 NOTE — Progress Notes (Signed)
[employer], [id number], approved ondansetron ODT 8mg  from 01/02/12-01/01/13.

## 2012-01-03 NOTE — Progress Notes (Signed)
RECEIVED A FAX FROM [facility] CONCERNING A CONFIRMATION OF PRESCRIPTION SHIPMENT FOR TEMOZOLOMIDE ON 01/02/12.

## 2012-01-04 ENCOUNTER — Other Ambulatory Visit: Payer: Self-pay | Admitting: *Deleted

## 2012-01-07 ENCOUNTER — Other Ambulatory Visit: Payer: Self-pay | Admitting: *Deleted

## 2012-01-07 ENCOUNTER — Ambulatory Visit
Admission: RE | Admit: 2012-01-07 | Discharge: 2012-01-07 | Disposition: A | Discharge status: Home / Self Care | Payer: Medicare Other | Source: Ambulatory Visit | Attending: Radiation Oncology | Admitting: Radiation Oncology

## 2012-01-07 ENCOUNTER — Other Ambulatory Visit (HOSPITAL_BASED_OUTPATIENT_CLINIC_OR_DEPARTMENT_OTHER): Payer: Medicare Other | Admitting: Lab

## 2012-01-07 ENCOUNTER — Other Ambulatory Visit (HOSPITAL_COMMUNITY)
Admission: RE | Admit: 2012-01-07 | Discharge: 2012-01-07 | Disposition: A | Discharge status: Home / Self Care | Payer: Medicare Other | Source: Ambulatory Visit | Attending: Oncology | Admitting: Oncology

## 2012-01-07 DIAGNOSIS — D496 Neoplasm of unspecified behavior of brain: Secondary | ICD-10-CM | POA: Insufficient documentation

## 2012-01-07 LAB — COMPREHENSIVE METABOLIC PANEL (CC13)
ALT: 23 U/L (ref 0–55)
AST: 10 U/L (ref 5–34)
Alkaline Phosphatase: 61 U/L (ref 40–150)
Creatinine: 0.7 mg/dL (ref 0.6–1.1)
Total Bilirubin: 1.34 mg/dL — ABNORMAL HIGH (ref 0.20–1.20)

## 2012-01-07 LAB — CBC WITH DIFFERENTIAL/PLATELET
BASO%: 0.1 % (ref 0.0–2.0)
EOS%: 0.1 % (ref 0.0–7.0)
HCT: 44.4 % (ref 34.8–46.6)
LYMPH%: 3.5 % — ABNORMAL LOW (ref 14.0–49.7)
MCH: 31.1 pg (ref 25.1–34.0)
MCHC: 33.8 g/dL (ref 31.5–36.0)
MONO%: 3.1 % (ref 0.0–14.0)
NEUT%: 93.2 % — ABNORMAL HIGH (ref 38.4–76.8)
Platelets: 183 10*3/uL (ref 145–400)
lymph#: 0.3 10*3/uL — ABNORMAL LOW (ref 0.9–3.3)

## 2012-01-07 MED ORDER — ONDANSETRON 8 MG PO TBDP: 8.0000 mg | ORAL_TABLET | Freq: Three times a day (TID) | ORAL | Status: DC | PRN

## 2012-01-08 ENCOUNTER — Ambulatory Visit: Admission: RE | Admit: 2012-01-08 | Payer: Medicare Other | Source: Ambulatory Visit

## 2012-01-08 ENCOUNTER — Ambulatory Visit
Admission: RE | Admit: 2012-01-08 | Discharge: 2012-01-08 | Disposition: A | Discharge status: Home / Self Care | Payer: Medicare Other | Source: Ambulatory Visit | Attending: Radiation Oncology | Admitting: Radiation Oncology

## 2012-01-08 ENCOUNTER — Other Ambulatory Visit: Payer: Self-pay | Admitting: *Deleted

## 2012-01-08 DIAGNOSIS — D496 Neoplasm of unspecified behavior of brain: Secondary | ICD-10-CM

## 2012-01-08 MED ORDER — TEMOZOLOMIDE 100 MG PO CAPS: 100.0000 mg | ORAL_CAPSULE | Freq: Every day | ORAL | Status: DC

## 2012-01-08 MED ORDER — TEMOZOLOMIDE 20 MG PO CAPS: 20.0000 mg | ORAL_CAPSULE | Freq: Every day | ORAL | Status: DC

## 2012-01-08 MED ORDER — TEMOZOLOMIDE 5 MG PO CAPS: ORAL_CAPSULE | ORAL | Status: DC

## 2012-01-08 NOTE — Telephone Encounter (Signed)
Called & spoke with pt's husband to clarify temodar dosage & schedule.  He reports that he has # 21 day supply from [facility] with 1 refill & started last hs on an empty stomach & wanted to make sure that was OK to take at hs.  She tol well & was given OK to take then.  Clarified other prescriptions & it appears that pt has needed meds.  [name] will call [facility] to see if they have the script for zofran when he get low on this med.  Kelly Maxwell's daughter, Kelly Maxwell wants to know if her mother can take magnesium taurate 125 mg tid for anxiety & magnesium replacement.  Informed that we would discuss with the pharmacist tomorrow to see if there will be any problems with radiation & chemo & get back with her at mom's home #.  

## 2012-01-08 NOTE — Progress Notes (Signed)
Post sim ed completed w/pt, husband and daughter. Gave pt "Radiation and You" booklet w/all pertinent pages marked and discussed, re: fatigue, hair loss, hair care, skin care, nutrition, pain, nausea. Pt and family had multiple questions. Answered questions to their verbalized satisfaction. Pt began Temodar last night at 8 pm; husband states he had question re: schedule to take med. Pt wants to continue taking at 8 pm. Husband states he left vm for [facility], Dr Patsy Lager office. This RN called Lendell Caprice, RN, left vm requesting she call pt today to clarify. Gave pt AVS.

## 2012-01-08 NOTE — Telephone Encounter (Signed)
Called & spoke with pt's husband to clarify temodar dosage & schedule.  He reports that he has # 21 day supply from [facility] with 1 refill & started last hs on an empty stomach & wanted to make sure that was OK to take at hs.  She tol well & was given OK to take then.  Clarified other prescriptions & it appears that pt has needed meds.  Mr. Slinger will call [facility] to see if they have the script for zofran when he get low on this med.  Ms. Lozano daughter, Denny Peon wants to know if her mother can take magnesium taurate 125 mg tid for anxiety & magnesium replacement.  Informed that we would discuss with the pharmacist tomorrow to see if there will be any problems with radiation & chemo & get back with her at mom's home #.

## 2012-01-09 ENCOUNTER — Ambulatory Visit
Admission: RE | Admit: 2012-01-09 | Discharge: 2012-01-09 | Disposition: A | Discharge status: Home / Self Care | Payer: Medicare Other | Source: Ambulatory Visit | Attending: Radiation Oncology | Admitting: Radiation Oncology

## 2012-01-09 ENCOUNTER — Telehealth: Payer: Self-pay | Admitting: *Deleted

## 2012-01-09 ENCOUNTER — Ambulatory Visit: Admission: RE | Admit: 2012-01-09 | Payer: Medicare Other | Source: Ambulatory Visit

## 2012-01-09 NOTE — Telephone Encounter (Signed)
Message left on pt's identified answering machine that mg tartrate should be OK for pt to take for mg+ replacement but probably won't help anxiety & shouldn't interfere with radiation or chemo per Lew/Pharmacist.

## 2012-01-10 ENCOUNTER — Ambulatory Visit
Admission: RE | Admit: 2012-01-10 | Discharge: 2012-01-10 | Disposition: A | Discharge status: Home / Self Care | Payer: Medicare Other | Source: Ambulatory Visit | Attending: Radiation Oncology | Admitting: Radiation Oncology

## 2012-01-10 ENCOUNTER — Ambulatory Visit: Admission: RE | Admit: 2012-01-10 | Payer: Medicare Other | Source: Ambulatory Visit

## 2012-01-10 ENCOUNTER — Encounter: Payer: Self-pay | Admitting: Radiation Oncology

## 2012-01-10 VITALS — BP 151/93 | HR 93 | Temp 98.8°F | Resp 20 | Wt 159.2 lb

## 2012-01-10 DIAGNOSIS — D496 Neoplasm of unspecified behavior of brain: Secondary | ICD-10-CM

## 2012-01-10 NOTE — Progress Notes (Signed)
Pt denies pain, HA, unsteady gait, nausea, vision changes, fatigue, loss of appetite. Taking Decadron 2 mg tid.

## 2012-01-10 NOTE — Progress Notes (Signed)
  Radiation Oncology         [phone] ________________________________  Name: Kelly Maxwell  MRN: [id number]  Date: 01/10/2012  DOB: Mar 25, 1945  Weekly Radiation Therapy Management  Current Dose: 8 Gy     Planned Dose:  44 Gy  Narrative . . . . . . . . The patient presents for routine under treatment assessment.                                                      The patient is without complaint.                                 Set-up films were reviewed.                                 The chart was checked. Physical Findings. . .  weight is 159 lb 3.2 oz (72.213 kg). Her oral temperature is 98.8 F (37.1 C). Her blood pressure is 151/93 and her pulse is 93. Her respiration is 20. . Weight essentially stable.  No significant changes. Impression . . . . . . . The patient is  tolerating radiation. Plan . . . . . . . . . . . . Continue treatment as planned.  ________________________________  Artist Pais. Kathrynn Running, M.D.

## 2012-01-11 ENCOUNTER — Ambulatory Visit: Admission: RE | Admit: 2012-01-11 | Payer: Medicare Other | Source: Ambulatory Visit

## 2012-01-11 ENCOUNTER — Ambulatory Visit
Admission: RE | Admit: 2012-01-11 | Discharge: 2012-01-11 | Disposition: A | Discharge status: Home / Self Care | Payer: Medicare Other | Source: Ambulatory Visit | Attending: Radiation Oncology | Admitting: Radiation Oncology

## 2012-01-14 ENCOUNTER — Ambulatory Visit: Admission: RE | Admit: 2012-01-14 | Payer: Medicare Other | Source: Ambulatory Visit

## 2012-01-14 ENCOUNTER — Telehealth: Payer: Self-pay | Admitting: Radiation Oncology

## 2012-01-14 ENCOUNTER — Ambulatory Visit
Admission: RE | Admit: 2012-01-14 | Discharge: 2012-01-14 | Disposition: A | Discharge status: Home / Self Care | Payer: Medicare Other | Source: Ambulatory Visit | Attending: Radiation Oncology | Admitting: Radiation Oncology

## 2012-01-14 ENCOUNTER — Other Ambulatory Visit (HOSPITAL_BASED_OUTPATIENT_CLINIC_OR_DEPARTMENT_OTHER): Payer: Medicare Other | Admitting: Lab

## 2012-01-14 DIAGNOSIS — D496 Neoplasm of unspecified behavior of brain: Secondary | ICD-10-CM

## 2012-01-14 LAB — COMPREHENSIVE METABOLIC PANEL (CC13)
ALT: 32 U/L (ref 0–55)
BUN: 16 mg/dL (ref 7.0–26.0)
CO2: 26 mEq/L (ref 22–29)
Creatinine: 0.7 mg/dL (ref 0.6–1.1)
Total Bilirubin: 0.86 mg/dL (ref 0.20–1.20)

## 2012-01-14 LAB — CBC WITH DIFFERENTIAL/PLATELET
BASO%: 0.2 % (ref 0.0–2.0)
Basophils Absolute: 0 10*3/uL (ref 0.0–0.1)
EOS%: 0.1 % (ref 0.0–7.0)
HCT: 44.4 % (ref 34.8–46.6)
LYMPH%: 4.5 % — ABNORMAL LOW (ref 14.0–49.7)
MCH: 30.4 pg (ref 25.1–34.0)
MCHC: 32.9 g/dL (ref 31.5–36.0)
MONO#: 0.4 10*3/uL (ref 0.1–0.9)
NEUT%: 91.3 % — ABNORMAL HIGH (ref 38.4–76.8)
Platelets: 266 10*3/uL (ref 145–400)

## 2012-01-14 LAB — TECHNOLOGIST REVIEW

## 2012-01-14 NOTE — Telephone Encounter (Signed)
Faxed [facility] 12/24/11, PUT 01/10/12, path 12/17/11 to [place] [facility] [place], [phone].  OK per RJM in Dr. Broadus John absence.  Received confirmation.

## 2012-01-15 ENCOUNTER — Ambulatory Visit
Admission: RE | Admit: 2012-01-15 | Discharge: 2012-01-15 | Disposition: A | Discharge status: Home / Self Care | Payer: Medicare Other | Source: Ambulatory Visit | Attending: Radiation Oncology | Admitting: Radiation Oncology

## 2012-01-15 ENCOUNTER — Ambulatory Visit: Admission: RE | Admit: 2012-01-15 | Payer: Medicare Other | Source: Ambulatory Visit

## 2012-01-16 ENCOUNTER — Telehealth: Payer: Self-pay | Admitting: Oncology

## 2012-01-16 ENCOUNTER — Ambulatory Visit: Admission: RE | Admit: 2012-01-16 | Payer: Medicare Other | Source: Ambulatory Visit

## 2012-01-16 ENCOUNTER — Ambulatory Visit
Admission: RE | Admit: 2012-01-16 | Discharge: 2012-01-16 | Disposition: A | Discharge status: Home / Self Care | Payer: Medicare Other | Source: Ambulatory Visit | Attending: Radiation Oncology | Admitting: Radiation Oncology

## 2012-01-16 NOTE — Telephone Encounter (Signed)
S/W pt in re NP appt 11/19 @ 9 w/Dr. Welton Flakes Calendar mailed.

## 2012-01-17 ENCOUNTER — Ambulatory Visit: Admission: RE | Admit: 2012-01-17 | Payer: Medicare Other | Source: Ambulatory Visit

## 2012-01-17 ENCOUNTER — Ambulatory Visit
Admission: RE | Admit: 2012-01-17 | Discharge: 2012-01-17 | Disposition: A | Discharge status: Home / Self Care | Payer: Medicare Other | Source: Ambulatory Visit | Attending: Radiation Oncology | Admitting: Radiation Oncology

## 2012-01-18 ENCOUNTER — Encounter: Payer: Self-pay | Admitting: Radiation Oncology

## 2012-01-18 ENCOUNTER — Ambulatory Visit
Admission: RE | Admit: 2012-01-18 | Discharge: 2012-01-18 | Disposition: A | Discharge status: Home / Self Care | Payer: Medicare Other | Source: Ambulatory Visit | Attending: Radiation Oncology | Admitting: Radiation Oncology

## 2012-01-18 ENCOUNTER — Ambulatory Visit: Admission: RE | Admit: 2012-01-18 | Payer: Medicare Other | Source: Ambulatory Visit

## 2012-01-18 VITALS — BP 142/89 | HR 97 | Temp 98.1°F | Resp 18 | Wt 161.1 lb

## 2012-01-18 DIAGNOSIS — D496 Neoplasm of unspecified behavior of brain: Secondary | ICD-10-CM

## 2012-01-18 MED ORDER — CLOTRIMAZOLE 10 MG MT LOZG: 10.0000 mg | LOZENGE | Freq: Three times a day (TID) | OROMUCOSAL | Status: DC

## 2012-01-18 NOTE — Progress Notes (Signed)
Patient presents to the clinic today accompanied by her daughter for PUT with Dr. Kathrynn Running. Patient alert and oriented to person, place, and time. No distress noted. Steady gait noted. Pleasant affect noted. Patient denies pain at this time. Patient reports she feels drowsy today. Patient denies taking any medication prior to her visit today. Patient reports that she got approximately 5 hour of sleep last night. Patient complains the mask is very tight at her throat. Two pound weight gain noted since last week. Patient taking decadron 2 mg tid. Patient reports a gigantic appetite. Patient denies headache, diplopia, floaters, decrease hearing, or nausea. Patient's daughter concerned about bruising on the anterior portion of her mother's lower leg. Patient denies difficulty swallowing. Patient questions if a physician should refill her clotrimazole 10 mg one tablet bid. No signs of thrush noted. Reported all findings to Dr. Kathrynn Running.

## 2012-01-18 NOTE — Progress Notes (Signed)
  Radiation Oncology         [phone] ________________________________  Name: Kelly Maxwell MRN: [id number]  Date: 01/18/2012  DOB: [birth year]  Weekly Radiation Therapy Management  Current Dose: 20 Gy     Planned Dose:  60 Gy  Narrative . . . . . . . . The patient presents for routine under treatment assessment.                                               Patient presents to the clinic today accompanied by her daughter for PUT with Dr. Kathrynn Running. Patient alert and oriented to person, place, and time. No distress noted. Steady gait noted. Pleasant affect noted. Patient denies pain at this time. Patient reports she feels drowsy today. Patient denies taking any medication prior to her visit today. Patient reports that she got approximately 5 hour of sleep last night. Patient complains the mask is very tight at her throat. Two pound weight gain noted since last week. Patient taking decadron 2 mg tid. Patient reports a gigantic appetite. Patient denies headache, diplopia, floaters, decrease hearing, or nausea. Patient's daughter concerned about bruising on the anterior portion of her mother's lower leg. Patient denies difficulty swallowing. Patient questions if a physician should refill her clotrimazole 10 mg one tablet bid. No signs of thrush noted.                                  Set-up films were reviewed.                                 The chart was checked. Physical Findings. . .  weight is 161 lb 1.6 oz (73.074 kg). Her oral temperature is 98.1 F (36.7 C). Her blood pressure is 142/89 and her pulse is 97. Her respiration is 18 and oxygen saturation is 94%. . Weight essentially stable.  No significant changes. Impression . . . . . . . The patient is  tolerating radiation. Plan . . . . . . . . . . . . Continue treatment as planned.  Sent one week supply of clotrimaxazole to [facility] and 3 month supply to [facility].  ________________________________  Artist Pais. Kathrynn Running, M.D.

## 2012-01-18 NOTE — Addendum Note (Signed)
Encounter addended by: Oneita Hurt, MD on: 01/18/2012  4:12 PM<BR>     Documentation filed: Orders

## 2012-01-19 ENCOUNTER — Encounter: Payer: Self-pay | Admitting: Oncology

## 2012-01-21 ENCOUNTER — Other Ambulatory Visit (HOSPITAL_BASED_OUTPATIENT_CLINIC_OR_DEPARTMENT_OTHER): Payer: Medicare Other | Admitting: Lab

## 2012-01-21 ENCOUNTER — Ambulatory Visit: Admission: RE | Admit: 2012-01-21 | Payer: Medicare Other | Source: Ambulatory Visit

## 2012-01-21 ENCOUNTER — Ambulatory Visit
Admission: RE | Admit: 2012-01-21 | Discharge: 2012-01-21 | Disposition: A | Discharge status: Home / Self Care | Payer: Medicare Other | Source: Ambulatory Visit | Attending: Radiation Oncology | Admitting: Radiation Oncology

## 2012-01-21 DIAGNOSIS — D496 Neoplasm of unspecified behavior of brain: Secondary | ICD-10-CM

## 2012-01-21 LAB — CBC WITH DIFFERENTIAL/PLATELET
Basophils Absolute: 0 10*3/uL (ref 0.0–0.1)
EOS%: 0.2 % (ref 0.0–7.0)
Eosinophils Absolute: 0 10*3/uL (ref 0.0–0.5)
HCT: 41.1 % (ref 34.8–46.6)
HGB: 13.7 g/dL (ref 11.6–15.9)
MCH: 31.3 pg (ref 25.1–34.0)
MONO#: 0.3 10*3/uL (ref 0.1–0.9)
NEUT#: 9.6 10*3/uL — ABNORMAL HIGH (ref 1.5–6.5)
NEUT%: 92.3 % — ABNORMAL HIGH (ref 38.4–76.8)
lymph#: 0.4 10*3/uL — ABNORMAL LOW (ref 0.9–3.3)

## 2012-01-21 LAB — COMPREHENSIVE METABOLIC PANEL (CC13)
Albumin: 3.3 g/dL — ABNORMAL LOW (ref 3.5–5.0)
BUN: 24 mg/dL (ref 7.0–26.0)
CO2: 22 mEq/L (ref 22–29)
Calcium: 8.9 mg/dL (ref 8.4–10.4)
Chloride: 112 mEq/L — ABNORMAL HIGH (ref 98–107)
Glucose: 111 mg/dl — ABNORMAL HIGH (ref 70–99)
Potassium: 3.9 mEq/L (ref 3.5–5.1)
Sodium: 140 mEq/L (ref 136–145)
Total Protein: 5.8 g/dL — ABNORMAL LOW (ref 6.4–8.3)

## 2012-01-22 ENCOUNTER — Ambulatory Visit
Admission: RE | Admit: 2012-01-22 | Discharge: 2012-01-22 | Disposition: A | Discharge status: Home / Self Care | Payer: Medicare Other | Source: Ambulatory Visit | Attending: Radiation Oncology | Admitting: Radiation Oncology

## 2012-01-22 ENCOUNTER — Telehealth: Payer: Self-pay | Admitting: *Deleted

## 2012-01-22 ENCOUNTER — Ambulatory Visit (HOSPITAL_BASED_OUTPATIENT_CLINIC_OR_DEPARTMENT_OTHER): Payer: Medicare Other | Admitting: Oncology

## 2012-01-22 ENCOUNTER — Ambulatory Visit: Admission: RE | Admit: 2012-01-22 | Payer: Medicare Other | Source: Ambulatory Visit

## 2012-01-22 ENCOUNTER — Encounter: Payer: Self-pay | Admitting: Oncology

## 2012-01-22 VITALS — BP 150/95 | HR 103 | Temp 98.4°F | Resp 20 | Ht 64.0 in | Wt 164.0 lb

## 2012-01-22 DIAGNOSIS — R5383 Other fatigue: Secondary | ICD-10-CM

## 2012-01-22 DIAGNOSIS — C712 Malignant neoplasm of temporal lobe: Secondary | ICD-10-CM

## 2012-01-22 DIAGNOSIS — R5381 Other malaise: Secondary | ICD-10-CM

## 2012-01-22 DIAGNOSIS — D496 Neoplasm of unspecified behavior of brain: Secondary | ICD-10-CM

## 2012-01-22 NOTE — Telephone Encounter (Signed)
Message copied by Sabino Snipes on Tue Jan 22, 2012 12:06 PM ------      Message from: Levert Feinstein      Created: Tue Jan 22, 2012 11:04 AM       Call pt counts stable on temodar. 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      I understand she is switching to Dr Welton Flakes - if this is so - alert lab to route all further results to her.

## 2012-01-22 NOTE — Telephone Encounter (Signed)
Gave patient appoitnment for 01-30-2012 starting at 3;30

## 2012-01-22 NOTE — Patient Instructions (Addendum)
Continue radiation and temdar daily  I will see you back in 1 week  Lab Results  Component Value Date   WBC 10.4* 01/21/2012   HGB 13.7 01/21/2012   HCT 41.1 01/21/2012   MCV 93.8 01/21/2012   PLT 246 01/21/2012

## 2012-01-22 NOTE — Progress Notes (Signed)
OFFICE PROGRESS NOTE  CC  [name],[name], MD [address] [place] [address] Dr. 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  PRIOR THERAPY:  #1 patient originally presented with 2-3 week loss of memory with trouble finding words. She had one episode of left parietal headache. Because of this she was seen by her internist an MRI of the brain was done on 12/13/2011 this showed a ring enhancing lesion in the left temporal lobe measuring 2.7 x 3.6 x 2.5 cm with 45 surrounding satellite lesions. She had a complete systemic workup including CT of the chest abdomen and pelvis that was negative. She went on to be seen by neurosurgery Dr. Phoebe Perch and patient underwent subtotal resection of the dominant lesion on October 14. 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 She went on to see Dr. Kennedy Bucker for 2 to who has been treating her she was also seen by Dr. Margaretmary Dys.  #2 patient was begun on radiation therapy with concurrent Temodar at 75 mg per meter squared. She is currently receiving this.  CURRENT THERAPY: Radiation therapy concurrently with Temodar 75 mg per meter squared.  INTERVAL HISTORY: Kelly Maxwell 66 y.o. female returns for followup visit today. She is establishing her oncologic care with me. Clinically she seems to be doing well. She denies any nausea vomiting fevers chills night sweats headaches no shortness of breath no chest pains or palpitations. She does have weakness and fatigue. She is also very emotional and cries easily today. Her husband and daughter accompany her. Her prior husband died from glioblastoma multiform a so is understandable what the patient and her daughter are going through.  MEDICAL HISTORY: Past Medical History  Diagnosis Date  . PONV (postoperative nausea and vomiting)   . Depression    . GERD (gastroesophageal reflux disease)   . Headache   . HLD (hyperlipidemia)   . Brain tumor 12/18/2011    Left temporal lobe 2.7 x 3.6 x 2.5 cm ring enhancing; numerous small surrounding satellite lesions 12/13/11  . 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 Hiatal hernia     ALLERGIES:  is allergic to codeine and compazine.  MEDICATIONS:  Current Outpatient Prescriptions  Medication Sig Dispense Refill  . aspirin EC 81 MG tablet Take 81 mg by mouth at bedtime.      Marland Kitchen buPROPion (WELLBUTRIN XL) 300 MG 24 hr tablet Take 300 mg by mouth every morning.      . Calcium Carbonate-Vitamin D (CALCIUM + D PO) Take 1 tablet by mouth 2 (two) times daily.      . cholecalciferol (VITAMIN D) 1000 UNITS tablet Take 1,000 Units by mouth daily.      . clonazePAM (KLONOPIN) 0.5 MG tablet Take 0.5 mg by mouth at bedtime as needed.      . clotrimazole (MYCELEX) 10 MG troche Take 1 lozenge (10 mg total) by mouth 3 (three) times daily.  21 lozenge  0  . dexamethasone (DECADRON) 4 MG tablet Take 2 mg by mouth 3 (three) times daily.      Marland Kitchen estradiol (ESTRACE) 0.5 MG tablet Take 0.5 mg by mouth at bedtime.      Marland Kitchen HYDROcodone-acetaminophen (NORCO/VICODIN) 5-325 MG per tablet Take 1 tablet by mouth every 4 (four) hours as needed for pain.  51 tablet  0  . LORazepam (ATIVAN)  1 MG tablet Take 1 tablet (1 mg total) by mouth every 8 (eight) hours.  60 tablet  0  . medroxyPROGESTERone (PROVERA) 2.5 MG tablet Take 2.5 mg by mouth at bedtime.      . Multiple Vitamin (MULTIVITAMIN WITH MINERALS) TABS Take 1 tablet by mouth daily.      Marland Kitchen nystatin-triamcinolone (MYCOLOG II) cream Apply 1 application topically daily.      Marland Kitchen omeprazole (PRILOSEC) 40 MG capsule       . ondansetron (ZOFRAN ODT) 8 MG disintegrating tablet Take 1 tablet (8 mg total) by mouth every 8 (eight) hours as needed for nausea.  30 tablet  0  . pantoprazole (PROTONIX) 40 MG tablet Take 40 mg by mouth daily.      . simvastatin  (ZOCOR) 40 MG tablet Take 40 mg by mouth at bedtime.      . sulfamethoxazole-trimethoprim (BACTRIM DS,SEPTRA DS) 800-160 MG per tablet Take one tablet Monday-Wednesday-Friday  12 tablet  3  . temozolomide (TEMODAR) 100 MG capsule Take 1 capsule (100 mg total) by mouth daily. May take on an empty stomach or at bedtime to decrease nausea & vomiting.  Pt also has 20 mg caps & 5 mg caps & total dose = 135 mg daily during radiation even on weekends.  42 capsule  0  . temozolomide (TEMODAR) 20 MG capsule Take 1 capsule (20 mg total) by mouth daily. May take on an empty stomach or at bedtime to decrease nausea & vomiting. Pt takes total of 135 mg daily & has 100mg  & 5 mg capsules to make this dose.  42 capsule  0  . temozolomide (TEMODAR) 5 MG capsule May take on an empty stomach or at bedtime to decrease nausea & vomiting. Take 135mg  total dose daily during radiation even on weekends.  (1) 100mg  cap (1) 20 mg cap & (3) 5 mg caps = 135 mg  126 capsule  0  . tretinoin (RETIN-A) 0.025 % cream Apply 1 application topically daily.      . valACYclovir (VALTREX) 500 MG tablet Take 1 tablet (500 mg total) by mouth daily.  30 tablet  6  . zolpidem (AMBIEN) 10 MG tablet Take 10 mg by mouth at bedtime as needed.        SURGICAL HISTORY:  Past Surgical History  Procedure Date  . Total knee arthroplasty 2000    left  . Bunionectomy 2001    right  . Craniotomy 12/17/2011    Procedure: CRANIOTOMY TUMOR EXCISION;  Surgeon: Clydene Fake, MD;  Location: [facility];  Service: Neurosurgery;  Laterality: Left;  LEFT temporal craniotomy with stealth for tumor resection  . Dilation and curettage of uterus 2007    REVIEW OF SYSTEMS:  Pertinent items are noted in HPI.   HEALTH MAINTENANCE:  PHYSICAL EXAMINATION: Blood pressure 150/95, pulse 103, temperature 98.4 F (36.9 C), temperature source Oral, resp. rate 20, height 5\' 4"  (1.626 m), weight 164 lb (74.39 kg). Body mass index is 28.15 kg/(m^2). ECOG PERFORMANCE  STATUS: 1 - Symptomatic but completely ambulatory   General appearance: alert, cooperative and appears stated age Lymph nodes: Cervical, supraclavicular, and axillary nodes normal. Resp: clear to auscultation bilaterally Back: symmetric, no curvature. ROM normal. No CVA tenderness. Cardio: regular rate and rhythm GI: soft, non-tender; bowel sounds normal; no masses,  no organomegaly Extremities: extremities normal, atraumatic, no cyanosis or edema Neurologic: Grossly normal   LABORATORY DATA: Lab Results  Component Value Date   WBC 10.4* 01/21/2012  HGB 13.7 01/21/2012   HCT 41.1 01/21/2012   MCV 93.8 01/21/2012   PLT 246 01/21/2012      Chemistry      Component Value Date/Time   NA 140 01/21/2012 1602   NA 138 12/18/2011 0440   K 3.9 01/21/2012 1602   K 4.0 12/18/2011 0440   CL 112* 01/21/2012 1602   CL 107 12/18/2011 0440   CO2 22 01/21/2012 1602   CO2 22 12/18/2011 0440   BUN 24.0 01/21/2012 1602   BUN 17 12/18/2011 0440   CREATININE 0.7 01/21/2012 1602   CREATININE 0.50 12/18/2011 0440   CREATININE 0.50 12/12/2011 0000      Component Value Date/Time   CALCIUM 8.9 01/21/2012 1602   CALCIUM 8.7 12/18/2011 0440   ALKPHOS 53 01/21/2012 1602   AST 11 01/21/2012 1602   ALT 31 01/21/2012 1602   BILITOT 0.75 01/21/2012 1602       RADIOGRAPHIC STUDIES:  No results found.  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 She is status post partial resection on 12/17/2011. She is now undergoing radiation concurrently with daily Temodar overall she is tolerating it well.  #2 after patient completes the Temodar we will plan on starting her on adjuvant Temodar plus Avastin. Her Temodar will be given days 1 through 5 on a 28 day cycle with Avastin given every 2 weeks. I have discussed this plan of care with the patient as well as her daughter and husband.  #3 patient is very much interested in seeking clinical trials and certainly I can refer her to  either [facility] or [facility] for consultation. They are going to think about it.   PLAN:   #1 continue concurrent chemoradiation. Her radiation well and on 02/06/2012.  #2 I will continue to follow the patient on a weekly basis.   All questions were answered. The patient knows to call the clinic with any problems, questions or concerns. We can certainly see the patient much sooner if necessary.  I spent 60 minutes counseling the patient face to face. The total time spent in the appointment was 60 minutes.    Drue Second, MD Medical/Oncology [facility] [phone] (beeper) [phone] (Office)  01/22/2012, 1:19 PM

## 2012-01-22 NOTE — Telephone Encounter (Signed)
Spoke with pt's husband & message about lab results & positive MGMT enzyme given & verified that pt is switching to Dr Welton Flakes because pt feels better with a woman Doctor.  Will make sure lab knows to route labs to Dr Welton Flakes.

## 2012-01-23 ENCOUNTER — Telehealth: Payer: Self-pay | Admitting: *Deleted

## 2012-01-23 ENCOUNTER — Ambulatory Visit: Admission: RE | Admit: 2012-01-23 | Payer: Medicare Other | Source: Ambulatory Visit

## 2012-01-23 ENCOUNTER — Ambulatory Visit
Admission: RE | Admit: 2012-01-23 | Discharge: 2012-01-23 | Disposition: A | Discharge status: Home / Self Care | Payer: Medicare Other | Source: Ambulatory Visit | Attending: Radiation Oncology | Admitting: Radiation Oncology

## 2012-01-23 ENCOUNTER — Encounter: Payer: Self-pay | Admitting: Radiation Oncology

## 2012-01-23 NOTE — Telephone Encounter (Signed)
Please have HIM refer the patient to [facility]

## 2012-01-23 NOTE — Telephone Encounter (Signed)
Triage ,RN here with pt's concerns: Pt requesting an appt with [facility] at [facility].  Last office note 11/19, pt was going to think about clinical trials at [facility]. Next f/u  01/30/12

## 2012-01-23 NOTE — Telephone Encounter (Signed)
Rec'd fax confirmation that patient Temozolomide shipped on 01/22/12 for next day delivery.

## 2012-01-24 ENCOUNTER — Encounter: Payer: Self-pay | Admitting: Oncology

## 2012-01-24 ENCOUNTER — Ambulatory Visit
Admission: RE | Admit: 2012-01-24 | Discharge: 2012-01-24 | Disposition: A | Discharge status: Home / Self Care | Payer: Medicare Other | Source: Ambulatory Visit | Attending: Radiation Oncology | Admitting: Radiation Oncology

## 2012-01-24 ENCOUNTER — Encounter: Payer: Medicare Other | Admitting: Nutrition

## 2012-01-24 ENCOUNTER — Encounter: Payer: Self-pay | Admitting: Nutrition

## 2012-01-24 ENCOUNTER — Ambulatory Visit: Admission: RE | Admit: 2012-01-24 | Payer: Medicare Other | Source: Ambulatory Visit

## 2012-01-24 NOTE — Progress Notes (Unsigned)
Patient did not show up for nutrition appointment. 

## 2012-01-25 ENCOUNTER — Ambulatory Visit: Admission: RE | Admit: 2012-01-25 | Payer: Medicare Other | Source: Ambulatory Visit

## 2012-01-25 ENCOUNTER — Ambulatory Visit
Admission: RE | Admit: 2012-01-25 | Discharge: 2012-01-25 | Disposition: A | Discharge status: Home / Self Care | Payer: Medicare Other | Source: Ambulatory Visit | Attending: Radiation Oncology | Admitting: Radiation Oncology

## 2012-01-25 ENCOUNTER — Telehealth: Payer: Self-pay | Admitting: *Deleted

## 2012-01-25 ENCOUNTER — Encounter: Payer: Self-pay | Admitting: Radiation Oncology

## 2012-01-25 DIAGNOSIS — D496 Neoplasm of unspecified behavior of brain: Secondary | ICD-10-CM

## 2012-01-25 NOTE — Progress Notes (Signed)
  Radiation Oncology         ([phone] ________________________________  Name: Kelly Maxwell MRN: [id number]  Date: 01/25/2012  DOB: 1946/02/28  Weekly Radiation Therapy Management  Current Dose: Reviewed  Narrative . . . . . . . . The patient presents for routine under treatment assessment.                                        The patient is without complaint.                                 Set-up films were reviewed.                                 The chart was checked. Physical Findings. . .  vitals were not taken for this visit.. Weight essentially stable.  No significant changes. Impression . . . . . . . The patient is  tolerating radiation. Plan . . . . . . . . . . . . Continue treatment as planned.  Reduce Dex from 2 mg TID to 2 mg BID  ________________________________  Artist Pais. Kathrynn Running, M.D.

## 2012-01-25 NOTE — Telephone Encounter (Signed)
Called medical records with information needed to fax patients medical records to [facility], fax # [phone], phone # [phone] attn to Ilean China.

## 2012-01-25 NOTE — Progress Notes (Signed)
Per Dr. Margaretmary Dys Dexamethasone two tabs tid was tapered today to two tabs bid.

## 2012-01-26 ENCOUNTER — Ambulatory Visit
Admission: RE | Admit: 2012-01-26 | Discharge: 2012-01-26 | Disposition: A | Discharge status: Home / Self Care | Payer: Medicare Other | Source: Ambulatory Visit | Attending: Radiation Oncology | Admitting: Radiation Oncology

## 2012-01-28 ENCOUNTER — Other Ambulatory Visit: Payer: Medicare Other | Admitting: Lab

## 2012-01-28 ENCOUNTER — Ambulatory Visit: Admission: RE | Admit: 2012-01-28 | Payer: Medicare Other | Source: Ambulatory Visit

## 2012-01-28 ENCOUNTER — Ambulatory Visit
Admission: RE | Admit: 2012-01-28 | Discharge: 2012-01-28 | Disposition: A | Discharge status: Home / Self Care | Payer: Medicare Other | Source: Ambulatory Visit | Attending: Radiation Oncology | Admitting: Radiation Oncology

## 2012-01-29 ENCOUNTER — Ambulatory Visit: Payer: Medicare Other | Admitting: Oncology

## 2012-01-29 ENCOUNTER — Ambulatory Visit: Admission: RE | Admit: 2012-01-29 | Payer: Medicare Other | Source: Ambulatory Visit

## 2012-01-29 ENCOUNTER — Ambulatory Visit
Admission: RE | Admit: 2012-01-29 | Discharge: 2012-01-29 | Disposition: A | Discharge status: Home / Self Care | Payer: Medicare Other | Source: Ambulatory Visit | Attending: Radiation Oncology | Admitting: Radiation Oncology

## 2012-01-30 ENCOUNTER — Ambulatory Visit
Admission: RE | Admit: 2012-01-30 | Discharge: 2012-01-30 | Disposition: A | Discharge status: Home / Self Care | Payer: Medicare Other | Source: Ambulatory Visit | Attending: Radiation Oncology | Admitting: Radiation Oncology

## 2012-01-30 ENCOUNTER — Encounter: Payer: Self-pay | Admitting: Oncology

## 2012-01-30 ENCOUNTER — Ambulatory Visit (HOSPITAL_BASED_OUTPATIENT_CLINIC_OR_DEPARTMENT_OTHER): Payer: Medicare Other | Admitting: Oncology

## 2012-01-30 ENCOUNTER — Other Ambulatory Visit (HOSPITAL_BASED_OUTPATIENT_CLINIC_OR_DEPARTMENT_OTHER): Payer: Medicare Other | Admitting: Lab

## 2012-01-30 ENCOUNTER — Telehealth: Payer: Self-pay | Admitting: *Deleted

## 2012-01-30 ENCOUNTER — Ambulatory Visit: Admission: RE | Admit: 2012-01-30 | Payer: Medicare Other | Source: Ambulatory Visit

## 2012-01-30 ENCOUNTER — Encounter: Payer: Self-pay | Admitting: Radiation Oncology

## 2012-01-30 VITALS — BP 134/78 | HR 90 | Temp 98.3°F | Resp 20 | Wt 164.4 lb

## 2012-01-30 DIAGNOSIS — R5381 Other malaise: Secondary | ICD-10-CM

## 2012-01-30 DIAGNOSIS — D496 Neoplasm of unspecified behavior of brain: Secondary | ICD-10-CM

## 2012-01-30 DIAGNOSIS — R5383 Other fatigue: Secondary | ICD-10-CM

## 2012-01-30 LAB — CBC WITH DIFFERENTIAL/PLATELET
Basophils Absolute: 0 10*3/uL (ref 0.0–0.1)
EOS%: 1 % (ref 0.0–7.0)
Eosinophils Absolute: 0.1 10*3/uL (ref 0.0–0.5)
HGB: 13.6 g/dL (ref 11.6–15.9)
MONO%: 5 % (ref 0.0–14.0)
NEUT#: 6.1 10*3/uL (ref 1.5–6.5)
RBC: 4.29 10*6/uL (ref 3.70–5.45)
RDW: 19.2 % — ABNORMAL HIGH (ref 11.2–14.5)
WBC: 7.2 10*3/uL (ref 3.9–10.3)
lymph#: 0.7 10*3/uL — ABNORMAL LOW (ref 0.9–3.3)
nRBC: 0 % (ref 0–0)

## 2012-01-30 LAB — COMPREHENSIVE METABOLIC PANEL (CC13)
ALT: 45 U/L (ref 0–55)
AST: 17 U/L (ref 5–34)
Calcium: 8.8 mg/dL (ref 8.4–10.4)
Chloride: 107 mEq/L (ref 98–107)
Creatinine: 0.6 mg/dL (ref 0.6–1.1)
Sodium: 141 mEq/L (ref 136–145)
Total Protein: 5.7 g/dL — ABNORMAL LOW (ref 6.4–8.3)

## 2012-01-30 MED ORDER — LORAZEPAM 1 MG PO TABS: 1.0000 mg | ORAL_TABLET | Freq: Three times a day (TID) | ORAL | Status: DC

## 2012-01-30 NOTE — Patient Instructions (Addendum)
Continue Rt

## 2012-01-30 NOTE — Progress Notes (Signed)
OFFICE PROGRESS NOTE  CC  Kelly Maxwell,[name], MD [address] [place] [address] Dr. 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  PRIOR THERAPY:  #1 patient originally presented with 2-3 week loss of memory with trouble finding words. She had one episode of left parietal headache. Because of this she was seen by her internist an MRI of the brain was done on 12/13/2011 this showed a ring enhancing lesion in the left temporal lobe measuring 2.7 x 3.6 x 2.5 cm with 45 surrounding satellite lesions. She had a complete systemic workup including CT of the chest abdomen and pelvis that was negative. She went on to be seen by neurosurgery Dr. Phoebe Perch and patient underwent subtotal resection of the dominant lesion on October 14. 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 She went on to see Dr. Kennedy Bucker for 2 to who has been treating her she was also seen by Dr. Margaretmary Dys.  #2 patient was begun on radiation therapy with concurrent Temodar at 75 mg per meter squared. She is currently receiving this.  CURRENT THERAPY: Radiation therapy concurrently with Temodar 75 mg per meter squared.  INTERVAL HISTORY: Kelly Maxwell 66 y.o. female returns for followup visit today. Clinically she seems to be doing well. She denies any nausea vomiting fevers chills night sweats headaches no shortness of breath no chest pains or palpitations. She does have weakness and fatigue. She is also very emotional and cries easily today. Her husband and daughter accompany her. Her prior husband died from glioblastoma multiform a so is understandable what the patient and her daughter are going through.  MEDICAL HISTORY: Past Medical History  Diagnosis Date  . PONV (postoperative nausea and vomiting)   . Depression   . GERD (gastroesophageal reflux disease)     . Headache   . HLD (hyperlipidemia)   . Brain tumor 12/18/2011    Left temporal lobe 2.7 x 3.6 x 2.5 cm ring enhancing; numerous small surrounding satellite lesions 12/13/11  . 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 Hiatal hernia     ALLERGIES:  is allergic to codeine and compazine.  MEDICATIONS:  Current Outpatient Prescriptions  Medication Sig Dispense Refill  . aspirin EC 81 MG tablet Take 81 mg by mouth at bedtime.      Marland Kitchen buPROPion (WELLBUTRIN XL) 300 MG 24 hr tablet Take 300 mg by mouth every morning.      . Calcium Carbonate-Vitamin D (CALCIUM + D PO) Take 1 tablet by mouth 2 (two) times daily.      . cholecalciferol (VITAMIN D) 1000 UNITS tablet Take 1,000 Units by mouth daily.      . clonazePAM (KLONOPIN) 0.5 MG tablet Take 0.5 mg by mouth at bedtime as needed.      . clotrimazole (MYCELEX) 10 MG troche Take 1 lozenge (10 mg total) by mouth 3 (three) times daily.  21 lozenge  0  . dexamethasone (DECADRON) 4 MG tablet Take 2 mg by mouth 2 (two) times daily with a meal.       . estradiol (ESTRACE) 0.5 MG tablet Take 0.5 mg by mouth at bedtime.      Marland Kitchen HYDROcodone-acetaminophen (NORCO/VICODIN) 5-325 MG per tablet Take 1 tablet by mouth every 4 (four) hours as needed for pain.  51 tablet  0  . LORazepam (ATIVAN) 1 MG tablet  Take 1 tablet (1 mg total) by mouth every 8 (eight) hours.  60 tablet  0  . medroxyPROGESTERone (PROVERA) 2.5 MG tablet Take 2.5 mg by mouth at bedtime.      . Multiple Vitamin (MULTIVITAMIN WITH MINERALS) TABS Take 1 tablet by mouth daily.      Marland Kitchen nystatin-triamcinolone (MYCOLOG II) cream Apply 1 application topically daily.      Marland Kitchen omeprazole (PRILOSEC) 40 MG capsule       . ondansetron (ZOFRAN ODT) 8 MG disintegrating tablet Take 1 tablet (8 mg total) by mouth every 8 (eight) hours as needed for nausea.  30 tablet  0  . pantoprazole (PROTONIX) 40 MG tablet Take 40 mg by mouth daily.      . simvastatin (ZOCOR) 40 MG tablet Take 40 mg by  mouth at bedtime.      . sulfamethoxazole-trimethoprim (BACTRIM DS,SEPTRA DS) 800-160 MG per tablet Take one tablet Monday-Wednesday-Friday  12 tablet  3  . temozolomide (TEMODAR) 100 MG capsule Take 1 capsule (100 mg total) by mouth daily. May take on an empty stomach or at bedtime to decrease nausea & vomiting.  Pt also has 20 mg caps & 5 mg caps & total dose = 135 mg daily during radiation even on weekends.  42 capsule  0  . temozolomide (TEMODAR) 20 MG capsule Take 1 capsule (20 mg total) by mouth daily. May take on an empty stomach or at bedtime to decrease nausea & vomiting. Pt takes total of 135 mg daily & has 100mg  & 5 mg capsules to make this dose.  42 capsule  0  . temozolomide (TEMODAR) 5 MG capsule May take on an empty stomach or at bedtime to decrease nausea & vomiting. Take 135mg  total dose daily during radiation even on weekends.  (1) 100mg  cap (1) 20 mg cap & (3) 5 mg caps = 135 mg  126 capsule  0  . tretinoin (RETIN-A) 0.025 % cream Apply 1 application topically daily.      . valACYclovir (VALTREX) 500 MG tablet Take 1 tablet (500 mg total) by mouth daily.  30 tablet  6  . zolpidem (AMBIEN) 10 MG tablet Take 10 mg by mouth at bedtime as needed.        SURGICAL HISTORY:  Past Surgical History  Procedure Date  . Total knee arthroplasty 2000    left  . Bunionectomy 2001    right  . Craniotomy 12/17/2011    Procedure: CRANIOTOMY TUMOR EXCISION;  Surgeon: Clydene Fake, MD;  Location: [facility];  Service: Neurosurgery;  Laterality: Left;  LEFT temporal craniotomy with stealth for tumor resection  . Dilation and curettage of uterus 2007    REVIEW OF SYSTEMS:  Pertinent items are noted in HPI.   HEALTH MAINTENANCE:  PHYSICAL EXAMINATION: There were no vitals taken for this visit. There is no height or weight on file to calculate BMI. ECOG PERFORMANCE STATUS: 1 - Symptomatic but completely ambulatory   General appearance: alert, cooperative and appears stated age Lymph  nodes: Cervical, supraclavicular, and axillary nodes normal. Resp: clear to auscultation bilaterally Back: symmetric, no curvature. ROM normal. No CVA tenderness. Cardio: regular rate and rhythm GI: soft, non-tender; bowel sounds normal; no masses,  no organomegaly Extremities: extremities normal, atraumatic, no cyanosis or edema Neurologic: Grossly normal   LABORATORY DATA: Lab Results  Component Value Date   WBC 7.2 01/30/2012   HGB 13.6 01/30/2012   HCT 40.9 01/30/2012   MCV 95.3 01/30/2012  PLT 192 01/30/2012      Chemistry      Component Value Date/Time   NA 140 01/21/2012 1602   NA 138 12/18/2011 0440   K 3.9 01/21/2012 1602   K 4.0 12/18/2011 0440   CL 112* 01/21/2012 1602   CL 107 12/18/2011 0440   CO2 22 01/21/2012 1602   CO2 22 12/18/2011 0440   BUN 24.0 01/21/2012 1602   BUN 17 12/18/2011 0440   CREATININE 0.7 01/21/2012 1602   CREATININE 0.50 12/18/2011 0440   CREATININE 0.50 12/12/2011 0000      Component Value Date/Time   CALCIUM 8.9 01/21/2012 1602   CALCIUM 8.7 12/18/2011 0440   ALKPHOS 53 01/21/2012 1602   AST 11 01/21/2012 1602   ALT 31 01/21/2012 1602   BILITOT 0.75 01/21/2012 1602       RADIOGRAPHIC STUDIES:  No results found.  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 She is status post partial resection on 12/17/2011. She is now undergoing radiation concurrently with daily Temodar overall she is tolerating it well.  #2 after patient completes the Temodar we will plan on starting her on adjuvant Temodar plus Avastin. Her Temodar will be given days 1 through 5 on a 28 day cycle with Avastin given every 2 weeks. I have discussed this plan of care with the patient as well as her daughter and husband.  #3 patient has appointment set up to be seen at [facility] as well as [facility].   PLAN:   #1 continue concurrent chemoradiation. Her radiation well and on 02/06/2012.  #2 I will continue to follow the patient on a  weekly basis.   All questions were answered. The patient knows to call the clinic with any problems, questions or concerns. We can certainly see the patient much sooner if necessary.  I spent 60 minutes counseling the patient face to face. The total time spent in the appointment was 60 minutes.    Drue Second, MD Medical/Oncology [facility] [phone] (beeper) [phone] (Office)  01/30/2012, 3:54 PM

## 2012-01-30 NOTE — Telephone Encounter (Signed)
gve the pt her dec 2013 appt calendar °

## 2012-01-30 NOTE — Progress Notes (Signed)
Pt reports fatigue/sleepiness, denies HA, pain, nausea, vision changes, unsteady gate. Decadron 2 mg bid.

## 2012-01-30 NOTE — Progress Notes (Signed)
Department of Radiation Oncology  Phone:  [phone] Fax:        [phone]  Weekly Treatment Note    Name: Kelly Maxwell Date: 01/30/2012 MRN: [id number] DOB: 11-03-1945   Current dose: 38 Gy  Current fraction: 19   MEDICATIONS: Current Outpatient Prescriptions  Medication Sig Dispense Refill  . aspirin EC 81 MG tablet Take 81 mg by mouth at bedtime.      Marland Kitchen buPROPion (WELLBUTRIN XL) 300 MG 24 hr tablet Take 300 mg by mouth every morning.      . Calcium Carbonate-Vitamin D (CALCIUM + D PO) Take 1 tablet by mouth 2 (two) times daily.      . cholecalciferol (VITAMIN D) 1000 UNITS tablet Take 1,000 Units by mouth daily.      . clonazePAM (KLONOPIN) 0.5 MG tablet Take 0.5 mg by mouth at bedtime as needed.      . clotrimazole (MYCELEX) 10 MG troche Take 1 lozenge (10 mg total) by mouth 3 (three) times daily.  21 lozenge  0  . dexamethasone (DECADRON) 4 MG tablet Take 2 mg by mouth 2 (two) times daily with a meal.       . estradiol (ESTRACE) 0.5 MG tablet Take 0.5 mg by mouth at bedtime.      Marland Kitchen HYDROcodone-acetaminophen (NORCO/VICODIN) 5-325 MG per tablet Take 1 tablet by mouth every 4 (four) hours as needed for pain.  51 tablet  0  . LORazepam (ATIVAN) 1 MG tablet Take 1 tablet (1 mg total) by mouth every 8 (eight) hours.  60 tablet  0  . medroxyPROGESTERone (PROVERA) 2.5 MG tablet Take 2.5 mg by mouth at bedtime.      . Multiple Vitamin (MULTIVITAMIN WITH MINERALS) TABS Take 1 tablet by mouth daily.      Marland Kitchen nystatin-triamcinolone (MYCOLOG II) cream Apply 1 application topically daily.      Marland Kitchen omeprazole (PRILOSEC) 40 MG capsule       . ondansetron (ZOFRAN ODT) 8 MG disintegrating tablet Take 1 tablet (8 mg total) by mouth every 8 (eight) hours as needed for nausea.  30 tablet  0  . pantoprazole (PROTONIX) 40 MG tablet Take 40 mg by mouth daily.      . simvastatin (ZOCOR) 40 MG tablet Take 40 mg by mouth at bedtime.      . sulfamethoxazole-trimethoprim (BACTRIM DS,SEPTRA DS)  800-160 MG per tablet Take one tablet Monday-Wednesday-Friday  12 tablet  3  . temozolomide (TEMODAR) 100 MG capsule Take 1 capsule (100 mg total) by mouth daily. May take on an empty stomach or at bedtime to decrease nausea & vomiting.  Pt also has 20 mg caps & 5 mg caps & total dose = 135 mg daily during radiation even on weekends.  42 capsule  0  . temozolomide (TEMODAR) 20 MG capsule Take 1 capsule (20 mg total) by mouth daily. May take on an empty stomach or at bedtime to decrease nausea & vomiting. Pt takes total of 135 mg daily & has 100mg  & 5 mg capsules to make this dose.  42 capsule  0  . temozolomide (TEMODAR) 5 MG capsule May take on an empty stomach or at bedtime to decrease nausea & vomiting. Take 135mg  total dose daily during radiation even on weekends.  (1) 100mg  cap (1) 20 mg cap & (3) 5 mg caps = 135 mg  126 capsule  0  . tretinoin (RETIN-A) 0.025 % cream Apply 1 application topically daily.      Marland Kitchen  valACYclovir (VALTREX) 500 MG tablet Take 1 tablet (500 mg total) by mouth daily.  30 tablet  6  . zolpidem (AMBIEN) 10 MG tablet Take 10 mg by mouth at bedtime as needed.         ALLERGIES: Codeine and Compazine   LABORATORY DATA:  Lab Results  Component Value Date   WBC 10.4* 01/21/2012   HGB 13.7 01/21/2012   HCT 41.1 01/21/2012   MCV 93.8 01/21/2012   PLT 246 01/21/2012   Lab Results  Component Value Date   NA 140 01/21/2012   K 3.9 01/21/2012   CL 112* 01/21/2012   CO2 22 01/21/2012   Lab Results  Component Value Date   ALT 31 01/21/2012   AST 11 01/21/2012   ALKPHOS 53 01/21/2012   BILITOT 0.75 01/21/2012     NARRATIVE: Kelly Maxwell was seen today for weekly treatment management. The chart was checked and the patient's films were reviewed. The patient states that she is doing quite well with her treatment with her primary complaint being some fatigue/sleepiness. She has gone down on her Decadron and this appears to have been well tolerated. She denies any  headaches, nausea, or vision changes.  PHYSICAL EXAMINATION: weight is 164 lb 6.4 oz (74.571 kg). Her oral temperature is 98.3 F (36.8 C). Her blood pressure is 134/78 and her pulse is 90. Her respiration is 20.      no thrush present  ASSESSMENT: The patient is doing satisfactorily with treatment.  PLAN: We will continue with the patient's radiation treatment as planned.

## 2012-02-04 ENCOUNTER — Other Ambulatory Visit: Payer: Self-pay | Admitting: Oncology

## 2012-02-04 ENCOUNTER — Ambulatory Visit
Admission: RE | Admit: 2012-02-04 | Discharge: 2012-02-04 | Disposition: A | Discharge status: Home / Self Care | Payer: Medicare Other | Source: Ambulatory Visit | Attending: Radiation Oncology | Admitting: Radiation Oncology

## 2012-02-04 ENCOUNTER — Ambulatory Visit: Admission: RE | Admit: 2012-02-04 | Payer: Medicare Other | Source: Ambulatory Visit

## 2012-02-05 ENCOUNTER — Ambulatory Visit
Admission: RE | Admit: 2012-02-05 | Discharge: 2012-02-05 | Disposition: A | Discharge status: Home / Self Care | Payer: Medicare Other | Source: Ambulatory Visit | Attending: Radiation Oncology | Admitting: Radiation Oncology

## 2012-02-05 ENCOUNTER — Ambulatory Visit: Admission: RE | Admit: 2012-02-05 | Payer: Medicare Other | Source: Ambulatory Visit

## 2012-02-06 ENCOUNTER — Telehealth: Payer: Self-pay | Admitting: *Deleted

## 2012-02-06 ENCOUNTER — Other Ambulatory Visit: Payer: Medicare Other | Admitting: Lab

## 2012-02-06 ENCOUNTER — Encounter: Payer: Self-pay | Admitting: Adult Health

## 2012-02-06 ENCOUNTER — Ambulatory Visit: Admission: RE | Admit: 2012-02-06 | Payer: Medicare Other | Source: Ambulatory Visit

## 2012-02-06 ENCOUNTER — Ambulatory Visit
Admission: RE | Admit: 2012-02-06 | Discharge: 2012-02-06 | Disposition: A | Discharge status: Home / Self Care | Payer: Medicare Other | Source: Ambulatory Visit | Attending: Radiation Oncology | Admitting: Radiation Oncology

## 2012-02-06 ENCOUNTER — Ambulatory Visit (HOSPITAL_BASED_OUTPATIENT_CLINIC_OR_DEPARTMENT_OTHER): Payer: Medicare Other | Admitting: Adult Health

## 2012-02-06 VITALS — BP 142/89 | HR 84 | Temp 98.8°F | Resp 20 | Ht 64.0 in | Wt 162.4 lb

## 2012-02-06 DIAGNOSIS — D496 Neoplasm of unspecified behavior of brain: Secondary | ICD-10-CM

## 2012-02-06 LAB — COMPREHENSIVE METABOLIC PANEL (CC13)
AST: 16 U/L (ref 5–34)
Albumin: 3.4 g/dL — ABNORMAL LOW (ref 3.5–5.0)
Alkaline Phosphatase: 52 U/L (ref 40–150)
BUN: 15 mg/dL (ref 7.0–26.0)
Calcium: 9 mg/dL (ref 8.4–10.4)
Creatinine: 0.7 mg/dL (ref 0.6–1.1)
Total Protein: 5.9 g/dL — ABNORMAL LOW (ref 6.4–8.3)

## 2012-02-06 LAB — CBC WITH DIFFERENTIAL/PLATELET
Basophils Absolute: 0 10*3/uL (ref 0.0–0.1)
EOS%: 0.3 % (ref 0.0–7.0)
HCT: 40.1 % (ref 34.8–46.6)
HGB: 13.6 g/dL (ref 11.6–15.9)
MCH: 32.5 pg (ref 25.1–34.0)
MCV: 95.7 fL (ref 79.5–101.0)
MONO%: 4.3 % (ref 0.0–14.0)
NEUT%: 89.5 % — ABNORMAL HIGH (ref 38.4–76.8)
RDW: 20.8 % — ABNORMAL HIGH (ref 11.2–14.5)

## 2012-02-06 NOTE — Patient Instructions (Addendum)
Doing well.  Continue with treatments.  We will see you back next week.

## 2012-02-06 NOTE — Telephone Encounter (Signed)
Gave patient appointment for 02-14-2012 starting at 12:30pm

## 2012-02-06 NOTE — Progress Notes (Signed)
OFFICE PROGRESS NOTE  CC  [name],[name], MD [address] [place] [address] Dr. 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  PRIOR THERAPY:  #1 patient originally presented with 2-3 week loss of memory with trouble finding words. She had one episode of left parietal headache. Because of this she was seen by her internist an MRI of the brain was done on 12/13/2011 this showed a ring enhancing lesion in the left temporal lobe measuring 2.7 x 3.6 x 2.5 cm with 45 surrounding satellite lesions. She had a complete systemic workup including CT of the chest abdomen and pelvis that was negative. She went on to be seen by neurosurgery Dr. Phoebe Perch and patient underwent subtotal resection of the dominant lesion on October 14. 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 She went on to see Dr. Kennedy Bucker for 2 to who has been treating her she was also seen by Dr. Margaretmary Dys.  #2 patient was begun on radiation therapy with concurrent Temodar at 75 mg per meter squared. She is currently receiving this.  CURRENT THERAPY: Radiation therapy concurrently with Temodar 75 mg per meter squared.  INTERVAL HISTORY: Kelly Maxwell 66 y.o. female returns for followup visit today. So far she's tolerating her radiation and Temodar very well.  She's fatigued, but she hasn't had any nausea, vomiting, or any other ill effects.  Her hair is thinning, but that is all she's noticed from the radiation so far.    MEDICAL HISTORY: Past Medical History  Diagnosis Date  . PONV (postoperative nausea and vomiting)   . Depression   . GERD (gastroesophageal reflux disease)   . Headache   . HLD (hyperlipidemia)   . Brain tumor 12/18/2011    Left temporal lobe 2.7 x 3.6 x 2.5 cm ring enhancing; numerous small surrounding satellite lesions 12/13/11  . 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 Hiatal hernia     ALLERGIES:  is allergic to codeine and compazine.  MEDICATIONS:  Current Outpatient Prescriptions  Medication Sig Dispense Refill  . aspirin EC 81 MG tablet Take 81 mg by mouth at bedtime.      Marland Kitchen buPROPion (WELLBUTRIN XL) 300 MG 24 hr tablet Take 300 mg by mouth every morning.      . Calcium Carbonate-Vitamin D (CALCIUM + D PO) Take 1 tablet by mouth 2 (two) times daily.      . cholecalciferol (VITAMIN D) 1000 UNITS tablet Take 1,000 Units by mouth daily.      . clonazePAM (KLONOPIN) 0.5 MG tablet Take 0.5 mg by mouth at bedtime as needed.      . clotrimazole (MYCELEX) 10 MG troche Take 1 lozenge (10 mg total) by mouth 3 (three) times daily.  21 lozenge  0  . dexamethasone (DECADRON) 4 MG tablet Take 2 mg by mouth 2 (two) times daily with a meal.       . estradiol (ESTRACE) 0.5 MG tablet Take 0.5 mg by mouth at bedtime.      Marland Kitchen HYDROcodone-acetaminophen (NORCO/VICODIN) 5-325 MG per tablet Take 1 tablet by mouth every 4 (four) hours as needed for pain.  51 tablet  0  . LORazepam (ATIVAN) 1 MG tablet Take 1 tablet (1 mg total) by mouth every 8 (eight) hours.  60 tablet  0  . medroxyPROGESTERone (PROVERA) 2.5 MG tablet Take  2.5 mg by mouth at bedtime.      . Multiple Vitamin (MULTIVITAMIN WITH MINERALS) TABS Take 1 tablet by mouth daily.      Marland Kitchen nystatin-triamcinolone (MYCOLOG II) cream Apply 1 application topically daily.      Marland Kitchen omeprazole (PRILOSEC) 40 MG capsule       . ondansetron (ZOFRAN-ODT) 8 MG disintegrating tablet TAKE 1 TABLET (8 MG TOTAL) BY MOUTH EVERY 8 (EIGHT) HOURS AS NEEDED FOR NAUSEA.  30 tablet  0  . pantoprazole (PROTONIX) 40 MG tablet Take 40 mg by mouth daily.      . simvastatin (ZOCOR) 40 MG tablet Take 40 mg by mouth at bedtime.      . sulfamethoxazole-trimethoprim (BACTRIM DS,SEPTRA DS) 800-160 MG per tablet Take one tablet Monday-Wednesday-Friday  12 tablet  3  . temozolomide (TEMODAR) 100 MG  capsule Take 1 capsule (100 mg total) by mouth daily. May take on an empty stomach or at bedtime to decrease nausea & vomiting.  Pt also has 20 mg caps & 5 mg caps & total dose = 135 mg daily during radiation even on weekends.  42 capsule  0  . temozolomide (TEMODAR) 20 MG capsule Take 1 capsule (20 mg total) by mouth daily. May take on an empty stomach or at bedtime to decrease nausea & vomiting. Pt takes total of 135 mg daily & has 100mg  & 5 mg capsules to make this dose.  42 capsule  0  . temozolomide (TEMODAR) 5 MG capsule May take on an empty stomach or at bedtime to decrease nausea & vomiting. Take 135mg  total dose daily during radiation even on weekends.  (1) 100mg  cap (1) 20 mg cap & (3) 5 mg caps = 135 mg  126 capsule  0  . tretinoin (RETIN-A) 0.025 % cream Apply 1 application topically daily.      . valACYclovir (VALTREX) 500 MG tablet Take 1 tablet (500 mg total) by mouth daily.  30 tablet  6  . zolpidem (AMBIEN) 10 MG tablet Take 10 mg by mouth at bedtime as needed.        SURGICAL HISTORY:  Past Surgical History  Procedure Date  . Total knee arthroplasty 2000    left  . Bunionectomy 2001    right  . Craniotomy 12/17/2011    Procedure: CRANIOTOMY TUMOR EXCISION;  Surgeon: Clydene Fake, MD;  Location: [facility];  Service: Neurosurgery;  Laterality: Left;  LEFT temporal craniotomy with stealth for tumor resection  . Dilation and curettage of uterus 2007    REVIEW OF SYSTEMS:   General: fatigue (-), night sweats (-), fever (-), pain (-) Lymph: palpable nodes (-) HEENT: vision changes (-), mucositis (-), gum bleeding (-), epistaxis (-) Cardiovascular: chest pain (-), palpitations (-) Pulmonary: shortness of breath (-), dyspnea on exertion (-), cough (-), hemoptysis (-) GI:  Early satiety (-), melena (-), dysphagia (-), nausea/vomiting (-), diarrhea (-) GU: dysuria (-), hematuria (-), incontinence (-) Musculoskeletal: joint swelling (-), joint pain (-), back pain (-) Neuro:  weakness (-), numbness (-), headache (-), confusion (-) Skin: Rash (-), lesions (-), dryness (-) Psych: depression (-), suicidal/homicidal ideation (-), feeling of hopelessness (-)    PHYSICAL EXAMINATION: Blood pressure 142/89, pulse 84, temperature 98.8 F (37.1 C), temperature source Oral, resp. rate 20, height 5\' 4"  (1.626 m), weight 162 lb 6.4 oz (73.664 kg). Body mass index is 27.88 kg/(m^2). General: Patient is a well appearing female in no acute distress HEENT: PERRLA, sclerae anicteric no conjunctival pallor,  MMM Neck: supple, no palpable adenopathy Lungs: clear to auscultation bilaterally, no wheezes, rhonchi, or rales Cardiovascular: regular rate rhythm, S1, S2, no murmurs, rubs or gallops Abdomen: Soft, non-tender, non-distended, normoactive bowel sounds, no HSM Extremities: warm and well perfused, no clubbing, cyanosis, or edema Skin: No rashes or lesions Neuro: Non-focal ECOG PERFORMANCE STATUS: 1 - Symptomatic but completely ambulatory   LABORATORY DATA: Lab Results  Component Value Date   WBC 7.0 02/06/2012   HGB 13.6 02/06/2012   HCT 40.1 02/06/2012   MCV 95.7 02/06/2012   PLT 244 02/06/2012      Chemistry      Component Value Date/Time   NA 141 02/06/2012 1413   NA 138 12/18/2011 0440   K 4.2 02/06/2012 1413   K 4.0 12/18/2011 0440   CL 107 02/06/2012 1413   CL 107 12/18/2011 0440   CO2 22 02/06/2012 1413   CO2 22 12/18/2011 0440   BUN 15.0 02/06/2012 1413   BUN 17 12/18/2011 0440   CREATININE 0.7 02/06/2012 1413   CREATININE 0.50 12/18/2011 0440   CREATININE 0.50 12/12/2011 0000      Component Value Date/Time   CALCIUM 9.0 02/06/2012 1413   CALCIUM 8.7 12/18/2011 0440   ALKPHOS 52 02/06/2012 1413   AST 16 02/06/2012 1413   ALT 43 02/06/2012 1413   BILITOT 0.92 02/06/2012 1413       RADIOGRAPHIC STUDIES:  No results found.  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 She is status post partial resection on 12/17/2011. She is now  undergoing radiation concurrently with daily Temodar overall she is tolerating it well.  #2 after patient completes the Temodar we will plan on starting her on adjuvant Temodar plus Avastin. Her Temodar will be given days 1 through 5 on a 28 day cycle with Avastin given every 2 weeks. I have discussed this plan of care with the patient as well as her daughter and husband.  #3 patient has appointment set up to be seen at [facility] as well as [facility].   PLAN:   #1 continue concurrent chemoradiation. She's doing well.  We will see her back next week.    All questions were answered. The patient knows to call the clinic with any problems, questions or concerns. We can certainly see the patient much sooner if necessary.  I spent 15 minutes counseling the patient face to face. The total time spent in the appointment was 30 minutes.  This case was reviewed with dr. Welton Flakes.   Cherie Ouch Lyn Hollingshead, NP Medical Oncology [facility] Phone: [phone] (Office)  02/06/2012, 4:02 PM

## 2012-02-07 ENCOUNTER — Encounter: Payer: Self-pay | Admitting: Oncology

## 2012-02-07 ENCOUNTER — Ambulatory Visit: Admission: RE | Admit: 2012-02-07 | Payer: Medicare Other | Source: Ambulatory Visit

## 2012-02-07 ENCOUNTER — Ambulatory Visit
Admission: RE | Admit: 2012-02-07 | Discharge: 2012-02-07 | Disposition: A | Discharge status: Home / Self Care | Payer: Medicare Other | Source: Ambulatory Visit | Attending: Radiation Oncology | Admitting: Radiation Oncology

## 2012-02-07 ENCOUNTER — Ambulatory Visit: Payer: Medicare Other

## 2012-02-08 ENCOUNTER — Ambulatory Visit
Admission: RE | Admit: 2012-02-08 | Discharge: 2012-02-08 | Disposition: A | Discharge status: Home / Self Care | Payer: Medicare Other | Source: Ambulatory Visit | Attending: Radiation Oncology | Admitting: Radiation Oncology

## 2012-02-08 ENCOUNTER — Telehealth: Payer: Self-pay | Admitting: *Deleted

## 2012-02-08 ENCOUNTER — Encounter: Payer: Self-pay | Admitting: Radiation Oncology

## 2012-02-08 ENCOUNTER — Other Ambulatory Visit: Payer: Self-pay | Admitting: Radiation Oncology

## 2012-02-08 ENCOUNTER — Ambulatory Visit: Admission: RE | Admit: 2012-02-08 | Payer: Medicare Other | Source: Ambulatory Visit | Admitting: Radiation Oncology

## 2012-02-08 ENCOUNTER — Ambulatory Visit: Admission: RE | Admit: 2012-02-08 | Payer: Medicare Other | Source: Ambulatory Visit

## 2012-02-08 VITALS — BP 127/75 | HR 91 | Temp 97.8°F | Resp 20 | Wt 164.2 lb

## 2012-02-08 DIAGNOSIS — G4700 Insomnia, unspecified: Secondary | ICD-10-CM

## 2012-02-08 DIAGNOSIS — D496 Neoplasm of unspecified behavior of brain: Secondary | ICD-10-CM

## 2012-02-08 MED ORDER — ALPRAZOLAM 0.25 MG PO TABS: 0.2500 mg | ORAL_TABLET | Freq: Three times a day (TID) | ORAL | Status: DC | PRN

## 2012-02-08 MED ORDER — MELATONIN 3 MG PO CAPS: 3.0000 mg | ORAL_CAPSULE | Freq: Every evening | ORAL | Status: DC | PRN

## 2012-02-08 MED ORDER — MODAFINIL 100 MG PO TABS: 100.0000 mg | ORAL_TABLET | Freq: Every day | ORAL | Status: DC

## 2012-02-08 NOTE — Progress Notes (Signed)
  Radiation Oncology         [phone] ________________________________  Name: Kelly Maxwell  MRN: [id number]  Date: 02/08/2012  DOB: 11/11/1945  Weekly Radiation Therapy Management  Current Dose: 48 Gy     Planned Dose:  60 Gy  Narrative . . . . . . . . The patient presents for routine under treatment assessment.                                             The patient is without complaint except fatigue.                                 Set-up films were reviewed.                                 The chart was checked. Physical Findings. . .  weight is 164 lb 3.2 oz (74.481 kg). Her oral temperature is 97.8 F (36.6 C). Her blood pressure is 127/75 and her pulse is 91. Her respiration is 20 and oxygen saturation is 97%. . Weight essentially stable.  No significant changes. Impression . . . . . . . The patient is  tolerating radiation. Plan . . . . . . . . . . . . Continue treatment as planned.  ________________________________  Artist Pais. Kathrynn Running, M.D.

## 2012-02-08 NOTE — Telephone Encounter (Signed)
Notified patient via home voice mail that OK to try OTC Melatonin 3 mg at night for sleep per Augustin Schooling, NP.

## 2012-02-08 NOTE — Progress Notes (Signed)
Patient here weekly rad txs, completed 24/30 , brain on  Boost completed 2/8  Today, patient alert,orientd x3, no c/o pain, nausea, head ache, does complain of very fatigued, 97% room air sats, on decadron 2mg  po bid, no thrush seen 2:09 PM

## 2012-02-11 ENCOUNTER — Ambulatory Visit (HOSPITAL_BASED_OUTPATIENT_CLINIC_OR_DEPARTMENT_OTHER): Admission: RE | Admit: 2012-02-11 | Payer: Medicare Other | Source: Ambulatory Visit

## 2012-02-11 ENCOUNTER — Ambulatory Visit
Admission: RE | Admit: 2012-02-11 | Discharge: 2012-02-11 | Disposition: A | Discharge status: Home / Self Care | Payer: Medicare Other | Source: Ambulatory Visit | Attending: Radiation Oncology | Admitting: Radiation Oncology

## 2012-02-12 ENCOUNTER — Telehealth: Payer: Self-pay | Admitting: *Deleted

## 2012-02-12 ENCOUNTER — Ambulatory Visit: Payer: Medicare Other

## 2012-02-12 ENCOUNTER — Ambulatory Visit
Admission: RE | Admit: 2012-02-12 | Discharge: 2012-02-12 | Disposition: A | Discharge status: Home / Self Care | Payer: Medicare Other | Source: Ambulatory Visit | Attending: Radiation Oncology | Admitting: Radiation Oncology

## 2012-02-12 NOTE — Telephone Encounter (Signed)
Faxed request to Dr. Rogers Seeds RN for review.  Pt. To see Dr. Welton Flakes on 02/14/12

## 2012-02-13 ENCOUNTER — Ambulatory Visit: Payer: Medicare Other

## 2012-02-13 ENCOUNTER — Ambulatory Visit
Admission: RE | Admit: 2012-02-13 | Discharge: 2012-02-13 | Disposition: A | Discharge status: Home / Self Care | Payer: Medicare Other | Source: Ambulatory Visit | Attending: Radiation Oncology | Admitting: Radiation Oncology

## 2012-02-13 ENCOUNTER — Encounter: Payer: Self-pay | Admitting: Radiation Oncology

## 2012-02-13 ENCOUNTER — Telehealth: Payer: Self-pay | Admitting: *Deleted

## 2012-02-13 ENCOUNTER — Telehealth: Payer: Self-pay | Admitting: Medical Oncology

## 2012-02-13 VITALS — BP 142/93 | HR 99 | Resp 18 | Wt 165.8 lb

## 2012-02-13 DIAGNOSIS — D496 Neoplasm of unspecified behavior of brain: Secondary | ICD-10-CM

## 2012-02-13 MED ORDER — TEMOZOLOMIDE 5 MG PO CAPS: ORAL_CAPSULE | ORAL | Status: DC

## 2012-02-13 MED ORDER — TEMOZOLOMIDE 100 MG PO CAPS: 100.0000 mg | ORAL_CAPSULE | Freq: Every day | ORAL | Status: DC

## 2012-02-13 MED ORDER — TEMOZOLOMIDE 20 MG PO CAPS: 20.0000 mg | ORAL_CAPSULE | Freq: Every day | ORAL | Status: DC

## 2012-02-13 NOTE — Progress Notes (Signed)
  Radiation Oncology         [phone] ________________________________  Name: Kelly Maxwell  MRN: [id number]  Date: 02/13/2012  DOB: 1945/12/23  Weekly Radiation Therapy Management  Current/Planned Dose: Reviewed and Verified  Narrative . . . . . . . . The patient presents for routine under treatment assessment.                                                      The patient is without complaint.                                 Set-up films were reviewed.                                 The chart was checked. Physical Findings. . .  weight is 165 lb 12.8 oz (75.206 kg). Her blood pressure is 142/93 and her pulse is 99. Her respiration is 18. . Weight essentially stable.  No significant changes. Impression . . . . . . . The patient is  tolerating radiation. Plan . . . . . . . . . . . . Continue treatment as planned.  Go to Dexamethasone 2 mg QD for 2 weeks on 12/16.  ________________________________  Artist Pais. Kathrynn Running, M.D.

## 2012-02-13 NOTE — Progress Notes (Signed)
Patient presents to the clinic today accompanied by her husband for a PUT with Dr. Kathrynn Running. Patient drowsy today. Patient reports that she has been up since 0500. Patient reports taking ativan prior to coming over today. Patient oriented to person, place, and time. No distress noted. Steady gait noted. Pleasant affect noted. Patient reports weakness and drowsiness for the last three day but, reports that it has been greatly improved today. Patient denies headache. Patient reports dry eye is worse. Patient denies diplopia. Patient denies nausea and vomiting. Patient denies floaters. Patient denies dizziness. Patient reports that she is happy her and her husband have found a medication regimen that works. She reports taking decadron 2 mg bid. Patient saw Dr. Dorna Bloom on Monday. Patient reports a gigantic appetite. Reported all findings to Dr. Kathrynn Running.

## 2012-02-13 NOTE — Patient Instructions (Signed)
Decrease dexamethasone to 2 mg daily on 9/16 and continue at that level for 2 weeks and then discontinue.

## 2012-02-13 NOTE — Telephone Encounter (Signed)
[facility] faxed refill request for Temodar.  Request to MD for review.

## 2012-02-13 NOTE — Telephone Encounter (Signed)
Per [facility], fax for refill request for Temozolomide was sent in error.

## 2012-02-13 NOTE — Addendum Note (Signed)
Addended by: Laroy Apple E on: 02/13/2012 03:58 PM   Modules accepted: Orders

## 2012-02-14 ENCOUNTER — Ambulatory Visit (HOSPITAL_BASED_OUTPATIENT_CLINIC_OR_DEPARTMENT_OTHER): Payer: Medicare Other | Admitting: Oncology

## 2012-02-14 ENCOUNTER — Ambulatory Visit: Payer: Medicare Other

## 2012-02-14 ENCOUNTER — Telehealth: Payer: Self-pay | Admitting: *Deleted

## 2012-02-14 ENCOUNTER — Ambulatory Visit
Admission: RE | Admit: 2012-02-14 | Discharge: 2012-02-14 | Disposition: A | Discharge status: Home / Self Care | Payer: Medicare Other | Source: Ambulatory Visit | Attending: Radiation Oncology | Admitting: Radiation Oncology

## 2012-02-14 ENCOUNTER — Encounter: Payer: Self-pay | Admitting: Oncology

## 2012-02-14 ENCOUNTER — Other Ambulatory Visit (HOSPITAL_BASED_OUTPATIENT_CLINIC_OR_DEPARTMENT_OTHER): Payer: Medicare Other | Admitting: Lab

## 2012-02-14 VITALS — BP 149/80 | HR 84 | Temp 98.5°F | Resp 20 | Ht 64.0 in | Wt 165.6 lb

## 2012-02-14 DIAGNOSIS — R5381 Other malaise: Secondary | ICD-10-CM

## 2012-02-14 DIAGNOSIS — C712 Malignant neoplasm of temporal lobe: Secondary | ICD-10-CM

## 2012-02-14 DIAGNOSIS — D496 Neoplasm of unspecified behavior of brain: Secondary | ICD-10-CM

## 2012-02-14 LAB — TECHNOLOGIST REVIEW

## 2012-02-14 LAB — COMPREHENSIVE METABOLIC PANEL (CC13)
ALT: 28 U/L (ref 0–55)
AST: 14 U/L (ref 5–34)
Albumin: 3.4 g/dL — ABNORMAL LOW (ref 3.5–5.0)
BUN: 20 mg/dL (ref 7.0–26.0)
CO2: 26 mEq/L (ref 22–29)
Calcium: 9.1 mg/dL (ref 8.4–10.4)
Chloride: 108 mEq/L — ABNORMAL HIGH (ref 98–107)
Potassium: 4 mEq/L (ref 3.5–5.1)

## 2012-02-14 LAB — CBC WITH DIFFERENTIAL/PLATELET
Basophils Absolute: 0 10*3/uL (ref 0.0–0.1)
EOS%: 0.3 % (ref 0.0–7.0)
HCT: 38.7 % (ref 34.8–46.6)
HGB: 12.9 g/dL (ref 11.6–15.9)
MCH: 32.3 pg (ref 25.1–34.0)
MONO#: 0.3 10*3/uL (ref 0.1–0.9)
NEUT#: 5.9 10*3/uL (ref 1.5–6.5)
RDW: 19.9 % — ABNORMAL HIGH (ref 11.2–14.5)
WBC: 6.8 10*3/uL (ref 3.9–10.3)
lymph#: 0.6 10*3/uL — ABNORMAL LOW (ref 0.9–3.3)

## 2012-02-14 NOTE — Patient Instructions (Addendum)
Finish up radiation therapy  You will stop temodar on Monday 12/16 when your last radiation therapy  Referral to Dr. Kayleen Memos

## 2012-02-14 NOTE — Telephone Encounter (Signed)
Gave patient appointment for 03-31-2012 starting at 10:00am  Gave information to medical records patient is requesting an second opinion with dr.lester

## 2012-02-14 NOTE — Progress Notes (Signed)
OFFICE PROGRESS NOTE  CC  [name],[name], MD [address] [place] [address] Dr. 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  PRIOR THERAPY:  #1 patient originally presented with 2-3 week loss of memory with trouble finding words. She had one episode of left parietal headache. Because of this she was seen by her internist an MRI of the brain was done on 12/13/2011 this showed a ring enhancing lesion in the left temporal lobe measuring 2.7 x 3.6 x 2.5 cm with 45 surrounding satellite lesions. She had a complete systemic workup including CT of the chest abdomen and pelvis that was negative. She went on to be seen by neurosurgery Dr. Phoebe Perch and patient underwent subtotal resection of the dominant lesion on October 14. 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 She went on to see Dr. Kennedy Bucker for 2 to who has been treating her she was also seen by Dr. Margaretmary Dys.  #2 patient was begun on radiation therapy with concurrent Temodar at 75 mg per meter squared. She is currently receiving this.  CURRENT THERAPY: Radiation therapy concurrently with Temodar 75 mg per meter squared.  INTERVAL HISTORY: Kelly Maxwell 66 y.o. female returns for followup visit today. So far she's tolerating her radiation and Temodar very well.  She's fatigued, but she hasn't had any nausea, vomiting, or any other ill effects.  Her hair is thinning, but that is all she's noticed from the radiation so far.    MEDICAL HISTORY: Past Medical History  Diagnosis Date  . PONV (postoperative nausea and vomiting)   . Depression   . GERD (gastroesophageal reflux disease)   . Headache   . HLD (hyperlipidemia)   . Brain tumor 12/18/2011    Left temporal lobe 2.7 x 3.6 x 2.5 cm ring enhancing; numerous small surrounding satellite lesions 12/13/11  . 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 Hiatal hernia     ALLERGIES:  is allergic to codeine and compazine.  MEDICATIONS:  Current Outpatient Prescriptions  Medication Sig Dispense Refill  . ALPRAZolam (XANAX) 0.25 MG tablet Take 1-2 tablets (0.25-0.5 mg total) by mouth 3 (three) times daily as needed for sleep (15 minutes prior to radiation daily).  30 tablet  0  . aspirin EC 81 MG tablet Take 81 mg by mouth at bedtime.      Marland Kitchen buPROPion (WELLBUTRIN XL) 300 MG 24 hr tablet Take 300 mg by mouth every morning.      . Calcium Carbonate-Vitamin D (CALCIUM + D PO) Take 1 tablet by mouth 2 (two) times daily.      . cholecalciferol (VITAMIN D) 1000 UNITS tablet Take 1,000 Units by mouth daily.      . clonazePAM (KLONOPIN) 0.5 MG tablet Take 0.5 mg by mouth at bedtime as needed.      . clotrimazole (MYCELEX) 10 MG troche Take 1 lozenge (10 mg total) by mouth 3 (three) times daily.  21 lozenge  0  . dexamethasone (DECADRON) 4 MG tablet Take 2 mg by mouth 2 (two) times daily with a meal.       . estradiol (ESTRACE) 0.5 MG tablet Take 0.5 mg by mouth at bedtime.      [name] (ATIVAN) 1 MG tablet Take 1 tablet (1 mg total) by mouth every 8 (eight) hours.  60 tablet  0  .  medroxyPROGESTERone (PROVERA) 2.5 MG tablet Take 2.5 mg by mouth at bedtime.      . Melatonin 3 MG CAPS Take 1 capsule (3 mg total) by mouth at bedtime as needed.    0  . Multiple Vitamin (MULTIVITAMIN WITH MINERALS) TABS Take 1 tablet by mouth daily.      Marland Kitchen nystatin-triamcinolone (MYCOLOG II) cream Apply 1 application topically daily.      Marland Kitchen omeprazole (PRILOSEC) 40 MG capsule       . ondansetron (ZOFRAN-ODT) 8 MG disintegrating tablet TAKE 1 TABLET (8 MG TOTAL) BY MOUTH EVERY 8 (EIGHT) HOURS AS NEEDED FOR NAUSEA.  30 tablet  0  . pantoprazole (PROTONIX) 40 MG tablet Take 40 mg by mouth daily.      . simvastatin (ZOCOR) 40 MG tablet Take 40 mg by mouth at bedtime.      . sulfamethoxazole-trimethoprim (BACTRIM  DS,SEPTRA DS) 800-160 MG per tablet Take one tablet Monday-Wednesday-Friday  12 tablet  3  . temozolomide (TEMODAR) 100 MG capsule Take 1 capsule (100 mg total) by mouth daily. May take on an empty stomach or at bedtime to decrease nausea & vomiting.  Pt also has 20 mg caps & 5 mg caps & total dose = 135 mg daily during radiation even on weekends.  21 capsule  0  . temozolomide (TEMODAR) 20 MG capsule Take 1 capsule (20 mg total) by mouth daily. May take on an empty stomach or at bedtime to decrease nausea & vomiting. Pt takes total of 135 mg daily & has 100mg  & 5 mg capsules to make this dose.  21 capsule  0  . temozolomide (TEMODAR) 5 MG capsule May take on an empty stomach or at bedtime to decrease nausea & vomiting. Take 135mg  total dose daily during radiation even on weekends.  (1) 100mg  cap (1) 20 mg cap & (3) 5 mg caps = 135 mg  63 capsule  0  . tretinoin (RETIN-A) 0.025 % cream Apply 1 application topically daily.      . valACYclovir (VALTREX) 500 MG tablet Take 1 tablet (500 mg total) by mouth daily.  30 tablet  6  . zolpidem (AMBIEN) 10 MG tablet Take 10 mg by mouth at bedtime as needed.      Marland Kitchen HYDROcodone-acetaminophen (NORCO/VICODIN) 5-325 MG per tablet Take 1 tablet by mouth every 4 (four) hours as needed for pain.  51 tablet  0  . modafinil (PROVIGIL) 100 MG tablet Take 1 tablet (100 mg total) by mouth daily.  30 tablet  5    SURGICAL HISTORY:  Past Surgical History  Procedure Date  . Total knee arthroplasty 2000    left  . Bunionectomy 2001    right  . Craniotomy 12/17/2011    Procedure: CRANIOTOMY TUMOR EXCISION;  Surgeon: Clydene Fake, MD;  Location: [facility];  Service: Neurosurgery;  Laterality: Left;  LEFT temporal craniotomy with stealth for tumor resection  . Dilation and curettage of uterus 2007    REVIEW OF SYSTEMS:   General: fatigue (-), night sweats (-), fever (-), pain (-) Lymph: palpable nodes (-) HEENT: vision changes (-), mucositis (-), gum bleeding (-),  epistaxis (-) Cardiovascular: chest pain (-), palpitations (-) Pulmonary: shortness of breath (-), dyspnea on exertion (-), cough (-), hemoptysis (-) GI:  Early satiety (-), melena (-), dysphagia (-), nausea/vomiting (-), diarrhea (-) GU: dysuria (-), hematuria (-), incontinence (-) Musculoskeletal: joint swelling (-), joint pain (-), back pain (-) Neuro: weakness (-), numbness (-), headache (-), confusion (-)  Skin: Rash (-), lesions (-), dryness (-) Psych: depression (-), suicidal/homicidal ideation (-), feeling of hopelessness (-)    PHYSICAL EXAMINATION: Blood pressure 149/80, pulse 84, temperature 98.5 F (36.9 C), temperature source Oral, resp. rate 20, height 5\' 4"  (1.626 m), weight 165 lb 9.6 oz (75.116 kg). Body mass index is 28.43 kg/(m^2). General: Patient is a well appearing female in no acute distress HEENT: PERRLA, sclerae anicteric no conjunctival pallor, MMM Neck: supple, no palpable adenopathy Lungs: clear to auscultation bilaterally, no wheezes, rhonchi, or rales Cardiovascular: regular rate rhythm, S1, S2, no murmurs, rubs or gallops Abdomen: Soft, non-tender, non-distended, normoactive bowel sounds, no HSM Extremities: warm and well perfused, no clubbing, cyanosis, or edema Skin: No rashes or lesions Neuro: Non-focal ECOG PERFORMANCE STATUS: 1 - Symptomatic but completely ambulatory   LABORATORY DATA: Lab Results  Component Value Date   WBC 6.8 02/14/2012   HGB 12.9 02/14/2012   HCT 38.7 02/14/2012   MCV 97.0 02/14/2012   PLT 220 02/14/2012      Chemistry      Component Value Date/Time   NA 141 02/06/2012 1413   NA 138 12/18/2011 0440   K 4.2 02/06/2012 1413   K 4.0 12/18/2011 0440   CL 107 02/06/2012 1413   CL 107 12/18/2011 0440   CO2 22 02/06/2012 1413   CO2 22 12/18/2011 0440   BUN 15.0 02/06/2012 1413   BUN 17 12/18/2011 0440   CREATININE 0.7 02/06/2012 1413   CREATININE 0.50 12/18/2011 0440   CREATININE 0.50 12/12/2011 0000      Component Value  Date/Time   CALCIUM 9.0 02/06/2012 1413   CALCIUM 8.7 12/18/2011 0440   ALKPHOS 52 02/06/2012 1413   AST 16 02/06/2012 1413   ALT 43 02/06/2012 1413   BILITOT 0.92 02/06/2012 1413       RADIOGRAPHIC STUDIES:  No results found.  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 She is status post partial resection on 12/17/2011. She is now undergoing radiation concurrently with daily Temodar overall she is tolerating it well.  #2 after patient completes the Temodar we will plan on starting her on adjuvant Temodar plus Avastin. Her Temodar will be given days 1 through 5 on a 28 day cycle with Avastin given every 2 weeks. I have discussed this plan of care with the patient as well as her daughter and husband.  #3 patient has appointment set up to be seen at [facility] as well as [facility].   PLAN:   #1 continue concurrent chemoradiation.She will complete her therapy on 02/18/2012.  #2 patient would like to be seen at other institutions and she RD has appointments set up.  #3 I will plan on seeing her back in about a month. All questions were answered. The patient knows to call the clinic with any problems, questions or concerns. We can certainly see the patient much sooner if necessary.  I spent 25 minutes counseling the patient face to face. The total time spent in the appointment was 30 minutes.  This case was reviewed with dr. Welton Flakes.   Cherie Ouch Lyn Hollingshead, NP Medical Oncology [facility] Phone: [phone] (Office)  02/14/2012, 1:12 PM

## 2012-02-15 ENCOUNTER — Ambulatory Visit: Payer: Medicare Other

## 2012-02-15 ENCOUNTER — Ambulatory Visit
Admission: RE | Admit: 2012-02-15 | Discharge: 2012-02-15 | Disposition: A | Discharge status: Home / Self Care | Payer: Medicare Other | Source: Ambulatory Visit | Attending: Radiation Oncology | Admitting: Radiation Oncology

## 2012-02-18 ENCOUNTER — Ambulatory Visit
Admission: RE | Admit: 2012-02-18 | Discharge: 2012-02-18 | Disposition: A | Discharge status: Home / Self Care | Payer: Medicare Other | Source: Ambulatory Visit | Attending: Radiation Oncology | Admitting: Radiation Oncology

## 2012-02-18 ENCOUNTER — Encounter: Payer: Self-pay | Admitting: Radiation Oncology

## 2012-02-18 ENCOUNTER — Ambulatory Visit: Payer: Medicare Other

## 2012-02-18 VITALS — BP 132/84 | HR 69 | Resp 18 | Wt 166.1 lb

## 2012-02-18 DIAGNOSIS — D496 Neoplasm of unspecified behavior of brain: Secondary | ICD-10-CM

## 2012-02-18 NOTE — Progress Notes (Signed)
  Radiation Oncology         [phone] ________________________________  Name: Kelly Maxwell             MRN: [id number]  Date: 02/18/2012  DOB: 1945-11-28  Weekly Radiation Therapy Management  Current Dose: 60 Gy     Planned Dose:  60 Gy  Narrative . . . . . . . . The patient presents for the final under treatment assessment.                                            The patient has had some continuation of previously noted symptoms.                                 Set-up films were reviewed.                                 The chart was checked. Physical Findings. . . Weight essentially stable.  No significant changes. Impression . . . . . . . The patient tolerated radiation relatively well. Plan . . . . . . . . . . . . Complete radiation today as scheduled, and follow-up in one month. The patient was encouraged to call or return to the clinic in the interim for any worsening symptoms.  ________________________________  Artist Pais Kathrynn Running, M.D.

## 2012-02-18 NOTE — Progress Notes (Signed)
Patient presents to the clinic today accompanied by her husband for a PUT with Dr. Kathrynn Running. Patient alert and oriented to person, place, and time. Patient oriented to person, place, and time. No distress noted. Steady gait noted. Pleasant affect noted. Patient reports that she slept well last night and is not feeling as fatigued today. Patient denies headache. Patient reports dry eye is worse. Patient denies diplopia. Patient denies nausea and vomiting. Patient denies floaters. Patient denies dizziness. She reports taking decadron 2 mg bid. Patient reports a gigantic appetite.  Patient reports seeing Dr. Dorna Bloom last week and feels the appointment went well but, has no final decisions yet. Patient continues to want to pursue two more physicians one be Lesser. Reported all findings to Dr. Kathrynn Running.

## 2012-02-19 ENCOUNTER — Ambulatory Visit: Payer: Medicare Other

## 2012-02-19 ENCOUNTER — Telehealth: Payer: Self-pay | Admitting: *Deleted

## 2012-02-19 NOTE — Telephone Encounter (Signed)
CALLED PATIENT TO INFORM OF TEST CHANGE - TO 03-27-12 AT 12:00 PM AT [facility], SPOKE WITH PATIENT AND SHE IS AWARE OF THE TEST CHANGE

## 2012-02-20 ENCOUNTER — Ambulatory Visit: Payer: Medicare Other

## 2012-02-21 ENCOUNTER — Ambulatory Visit: Payer: Medicare Other

## 2012-02-21 NOTE — Progress Notes (Signed)
°  Radiation Oncology         [phone] ________________________________  Name: Kelly Maxwell MRN: [id number]  Date: 02/18/2012  DOB: 02-Feb-1946  End of Treatment Note  Diagnosis:   66 year old woman s/p partial resection of a multifocal left temporal grade IV gliosarcoma  Indication for treatment:  Local Control       Radiation treatment dates:   01/07/2012-02/18/2012  Site/dose:    1.  The enhancing foci of centrally necrotic gross disease (gross target volume-GTV) plus the surrounding edema (clinical target volume-CTV) was treated to 44 gray in 22 fractions of 2 gray 2.  The enhancing foci of centrally necrotic gross disease (gross target volume-GTV) alone was boosted to 60 gray with 8 additional fractions of 2 gray  Beams/energy:    1.  The enhancing foci of centrally necrotic gross disease (gross target volume-GTV) plus the surrounding edema (clinical target volume-CTV) was treated using 2 rapid ARC volumetric ARC therapy intensity modulated radiotherapy beams delivering 6 megavolt photons with daily image guidance using cone beam CT and thermoplastic mask immobilization. 2.  The enhancing foci of centrally necrotic gross disease (gross target volume-GTV) alone was boosted to 60 gray with 8 additional fractions of 2 gray alsowas treated using 2 rapid ARC volumetric ARC therapy intensity modulated radiotherapy beams delivering 6 megavolt photons with daily image guidance using cone beam CT and thermoplastic mask immobilization.  Narrative: The patient tolerated radiation treatment relatively well.   She did suffer with some nausea during the course of her treatment. She was maintained on dexamethasone at 2 mg by mouth twice a day. She suffered with some anxiety related to her diagnosis and treatment. However, she did maintain an excellent performance status during radiation due in no small part to her very supportive family members who assisted in the transportation, activities of daily  living, and ongoing dialog related to her diagnosis and options.  Plan: The patient has completed radiation treatment. The patient will return to radiation oncology clinic for routine followup in one month. I advised her and her family to call or return sooner if they have any questions or concerns related to their recovery or treatment. ________________________________  Artist Pais. Kathrynn Running, M.D.

## 2012-02-22 ENCOUNTER — Ambulatory Visit: Payer: Medicare Other

## 2012-02-24 NOTE — Progress Notes (Signed)
  Radiation Oncology         [phone] ________________________________  Name: Kelly Maxwell  MRN: [id number]  Date: 01/02/2012  DOB: 1945-05-03  SPECIAL TREATMENT PROCEDURE NOTE  NARRATIVE:  The planned course of therapy using radiation constitutes a special treatment procedure. Special care is required in the management of this patient for the following reasons.  The patient is planned to receive Concurrent chemotherapy requiring careful monitoring for increased toxicities of treatment including periodic laboratory values.  The special nature of the planned course of radiotherapy will require increased physician supervision and oversight to ensure patient's safety with optimal treatment outcomes. ________________________________  Artist Pais Kathrynn Running, M.D.

## 2012-02-25 ENCOUNTER — Ambulatory Visit: Payer: Medicare Other

## 2012-02-25 ENCOUNTER — Telehealth: Payer: Self-pay

## 2012-02-25 NOTE — Telephone Encounter (Signed)
Tiffany from [facility] office called to inquire if Dr.Manning had placed referral for patient to be seen.Sent in basket for Colen Darling RN to address.Tiffany aware it may be first of next week as Dr.Manning out of office this week.

## 2012-02-26 ENCOUNTER — Ambulatory Visit: Payer: Medicare Other

## 2012-02-26 ENCOUNTER — Telehealth: Payer: Self-pay | Admitting: *Deleted

## 2012-02-26 NOTE — Telephone Encounter (Signed)
error 

## 2012-02-28 ENCOUNTER — Ambulatory Visit: Payer: Medicare Other

## 2012-02-29 ENCOUNTER — Ambulatory Visit: Payer: Medicare Other

## 2012-03-03 ENCOUNTER — Ambulatory Visit: Payer: Medicare Other

## 2012-03-03 ENCOUNTER — Telehealth: Payer: Self-pay | Admitting: Radiation Oncology

## 2012-03-03 NOTE — Telephone Encounter (Signed)
Spoke with spouse, Pami Wool.  He wants a CD with head images to take to appt with Dr. Kayleen Memos on Thursday.  Spoke to Radiology, and the disk will be ready tomorrow.  Called him back; he will pick it up. [phone]

## 2012-03-04 ENCOUNTER — Ambulatory Visit: Payer: Medicare Other

## 2012-03-06 ENCOUNTER — Ambulatory Visit: Payer: Medicare Other

## 2012-03-07 ENCOUNTER — Telehealth: Payer: Self-pay | Admitting: Medical Oncology

## 2012-03-07 ENCOUNTER — Encounter: Payer: Self-pay | Admitting: Oncology

## 2012-03-07 ENCOUNTER — Ambulatory Visit: Payer: Medicare Other

## 2012-03-07 ENCOUNTER — Encounter: Payer: Self-pay | Admitting: Adult Health

## 2012-03-07 NOTE — Telephone Encounter (Signed)
Pt's daughter called requesting to know if pt's brain tumor is EGFR positive. Per MD informed daughter that this is not routinely looked at on GBM's and that it's a clinical trial testing. Daughter concerned after Pt having visit at [facility] and question of clinical trials. Stated pt has upcoming appt with [facility] 03/13/12 and requesting newest Oncology notes to be faxed to [place] at [phone] to program specialist [facility]. Will forward request to med record and MD.

## 2012-03-09 ENCOUNTER — Encounter: Payer: Self-pay | Admitting: Oncology

## 2012-03-09 NOTE — Telephone Encounter (Signed)
Please have notes faxed to [facility]

## 2012-03-10 ENCOUNTER — Telehealth: Payer: Self-pay | Admitting: Oncology

## 2012-03-10 ENCOUNTER — Telehealth: Payer: Self-pay | Admitting: *Deleted

## 2012-03-10 NOTE — Telephone Encounter (Signed)
Pt's daughter called states " Can my mother get the flu shot?" discussed with Denny Peon, I would review with provider. Erin requested confirmation all records had been sent to [facility] for pt's upcoming appt.  Note in system by medical records, pt's medical records faxed 03/10/12. No further questions. Next f/u 03/31/12

## 2012-03-10 NOTE — Telephone Encounter (Signed)
Faxed pt medical records to [facility] program specialist at [facility]

## 2012-03-10 NOTE — Telephone Encounter (Signed)
Per Md okay for pt to get flu shot. Called left message with female family member at [phone], who verbalized pt has an appt with PCP tomorrow for her congestion/ possible flu like symptoms and he will give Denny Peon message pt ok to get flu shot. Pt should not get flu shot while she is sick., to wait until she does not have congestions/Flu like symptoms. He verbalized understanding. No further questions.

## 2012-03-11 ENCOUNTER — Ambulatory Visit
Admission: RE | Admit: 2012-03-11 | Discharge: 2012-03-11 | Disposition: A | Discharge status: Home / Self Care | Payer: Medicare Other | Source: Ambulatory Visit | Attending: Radiation Oncology | Admitting: Radiation Oncology

## 2012-03-11 DIAGNOSIS — D496 Neoplasm of unspecified behavior of brain: Secondary | ICD-10-CM

## 2012-03-11 MED ORDER — GADOBENATE DIMEGLUMINE 529 MG/ML IV SOLN
16.0000 mL | Freq: Once | INTRAVENOUS | Status: AC | PRN
  Administered 2012-03-11: 16 mL via INTRAVENOUS

## 2012-03-12 ENCOUNTER — Encounter: Payer: Self-pay | Admitting: Specialist

## 2012-03-12 ENCOUNTER — Ambulatory Visit: Payer: Medicare Other | Admitting: Nutrition

## 2012-03-12 NOTE — Progress Notes (Signed)
This is a 67 year old female patient of Dr. Welton Flakes diagnosed with a brain tumor/sarcoma in October 2013. She is status post radiation therapy  Past medical history includes postop nausea vomiting, depression, GERD, hyperlipidemia and hiatal hernia.  Medications include Wellbutrin, vitamin D, calcium, Decadron, Ativan, multivitamin, Zofran, Protonix, Zocor, and Temodar.  Labs were reviewed.  Height: 64 inches. Weight 166 pounds December 16. Usual body weight: 160 pounds. BMI: 28.5.  Patient reports she is recovering from a bad cold. She is drowsy and somewhat slow to respond. She states that she is going to [place]. She has questions regarding dietary strategies during treatment. She believes in the importance of a combination of diet, exercise, and medication.  Nutrition diagnosis: Food and nutrition related knowledge deficit related to diagnosis of brain tumor as evidenced by no prior need for nutrition related information.  Intervention: I educated patient on evidence-based research regarding diet therapy for patients with brain cancer. I printed out a research article for her to review. She will bring me a book that she has been reading so that I can review diet recommendations. I also recommended patient contact our librarian at [facility] for assistance in obtaining evidence-based nutrition therapy. Her contact information was provided.  Monitoring, evaluation, goals: Patient will educate herself about various diet therapy and followup with me regarding her choice while she is receiving treatment. I will assist patient as I am able.  Next visit: No followup scheduled at this time as I await patient to contact me.

## 2012-03-12 NOTE — Progress Notes (Signed)
I met today for an hour with the patient at her request.  She described the chain of events relating to her diagnosis and treatment, as well as some family history, including losing her first husband in 1998/08/01 who died from a brain tumor.  She was tearful, talked about not wanting to die and wanting to remain hopeful.  She is very family oriented and has 4 grandchildren who are very dear to her.  She indicated she wanted to know how to do meditation as a means of staying grounded and calm.  She described times of waking up in the night and being panicked about her situation and that she "hates to do this to my family."  She exhibits grief about losing the life that was hers and her hopes for the future.  She is trying to deal with the prospect of death.  I encouraged her to consider getting counseling and told her how we could make that available at [facility].  I also encouraged counseling for her youngest daughter, who lives with her and her second husband.  I suggested she think about those things that are most important for her to do in her life at this point.  We did a brief meditation together, which incorporated breathing and guided imagery, and I gave her a CD she could use for meditation at home.  The patient indicated she is on medication for depression and has a prescription to address anxiety.  She will contact me if she decides to pursue a counseling option here.  I also offered to meet with her a second time.

## 2012-03-18 ENCOUNTER — Encounter: Payer: Self-pay | Admitting: Adult Health

## 2012-03-18 ENCOUNTER — Telehealth: Payer: Self-pay | Admitting: *Deleted

## 2012-03-18 NOTE — Telephone Encounter (Signed)
Do we know what dose she is going to be getting?

## 2012-03-18 NOTE — Telephone Encounter (Signed)
Call from pt's husband regarding Rx for Temodar. Mr. Cressey states " We need Dr. Welton Flakes to call in temodar for [place], [facility] should have sent all the information over it was to start on 01/13, they said we must have fallen through the cracks.. It needs to be sent to [facility]. Cb# [id number].   Call received from [facility] @ [facility] regarding pt Rx for temodar. Rx can be faxed to  [phone]  Pt last seen 02/14/12. Pt was to be seen at [facility] [place] and [facility] for 2nd opinions. Next follow up with MD 01/27 Will review with MD

## 2012-03-19 ENCOUNTER — Telehealth: Payer: Self-pay | Admitting: *Deleted

## 2012-03-19 ENCOUNTER — Other Ambulatory Visit: Payer: Self-pay | Admitting: Oncology

## 2012-03-19 ENCOUNTER — Encounter: Payer: Self-pay | Admitting: *Deleted

## 2012-03-19 DIAGNOSIS — D496 Neoplasm of unspecified behavior of brain: Secondary | ICD-10-CM

## 2012-03-19 MED ORDER — TEMOZOLOMIDE 250 MG PO CAPS: 150.0000 mg/m2/d | ORAL_CAPSULE | Freq: Every day | ORAL | Status: DC

## 2012-03-19 MED ORDER — TEMOZOLOMIDE 20 MG PO CAPS: 20.0000 mg | ORAL_CAPSULE | Freq: Every day | ORAL | Status: DC

## 2012-03-19 MED ORDER — TEMOZOLOMIDE 5 MG PO CAPS: 5.0000 mg | ORAL_CAPSULE | Freq: Every day | ORAL | Status: DC

## 2012-03-19 NOTE — Telephone Encounter (Signed)
Patient confirmed over the phone the new date and time on 03-20-2012

## 2012-03-19 NOTE — Telephone Encounter (Signed)
Pt's Rx for Temodar faxed to [facility] [phone], called spoke with Rosey Bath to confirm Rx was received. Verified verbally pt's Rx, phone # and address with Rosey Bath as pt is expecting call from [facility] and overnight delivery. Rosey Bath confirmed overnight is available. Call to pt , unable to reach pt, lmovm confirming [facility] has Rx for Temodar, to expect a call from them as they will need to confirm pt address for over night delivery.

## 2012-03-19 NOTE — Telephone Encounter (Signed)
Please schedule patient to see me on 1/16 at 3:30

## 2012-03-19 NOTE — Telephone Encounter (Signed)
Pt here with Daughter in lobby requesting to see MD. Sherron Monday with pt in lobby discussed MD currently in clinic and is unable to see pt. Pt concerned about Temodar Rx.  Discussed with pt we have received recent progress note from [facility], and MD is aware of new Rx for Temodar. I will pass pt's concerns to MD with her request for a Rx for Temodar be called to [facility] today as she will need this prior to Friday when sheh leaves to go out of town. Informed pt I will then call her to inform her this has been completed. Pt verbalized understanding. No further questions.  Pt has appt 03/20/12 at 3:30pm to see MD.

## 2012-03-20 ENCOUNTER — Ambulatory Visit (HOSPITAL_BASED_OUTPATIENT_CLINIC_OR_DEPARTMENT_OTHER): Payer: Medicare Other | Admitting: Oncology

## 2012-03-20 ENCOUNTER — Encounter: Payer: Self-pay | Admitting: *Deleted

## 2012-03-20 VITALS — BP 162/94 | HR 121 | Temp 98.8°F | Resp 20 | Ht 64.0 in | Wt 162.3 lb

## 2012-03-20 DIAGNOSIS — D496 Neoplasm of unspecified behavior of brain: Secondary | ICD-10-CM

## 2012-03-20 DIAGNOSIS — C712 Malignant neoplasm of temporal lobe: Secondary | ICD-10-CM

## 2012-03-20 NOTE — Progress Notes (Signed)
RECEIVED A FAX FROM [facility] CONCERNING A CONFIRMATION OF PRESCRIPTION SHIPMENT FOR TEMOZOLOMIDE ON 03/19/12.

## 2012-03-25 ENCOUNTER — Other Ambulatory Visit: Payer: Self-pay | Admitting: *Deleted

## 2012-03-25 DIAGNOSIS — D496 Neoplasm of unspecified behavior of brain: Secondary | ICD-10-CM

## 2012-03-25 MED ORDER — ONDANSETRON 8 MG PO TBDP: 8.0000 mg | ORAL_TABLET | Freq: Three times a day (TID) | ORAL | Status: DC | PRN

## 2012-03-26 ENCOUNTER — Encounter: Payer: Self-pay | Admitting: Radiation Oncology

## 2012-03-27 ENCOUNTER — Other Ambulatory Visit (HOSPITAL_COMMUNITY): Payer: Medicare Other

## 2012-03-27 ENCOUNTER — Other Ambulatory Visit: Payer: Medicare Other

## 2012-03-30 ENCOUNTER — Encounter: Payer: Self-pay | Admitting: Radiation Oncology

## 2012-03-30 NOTE — Progress Notes (Signed)
Radiation Oncology         [phone] ________________________________  Name: Kelly Maxwell MRN: [id number]  Date: 03/31/2012  DOB: 1945/08/27  Multidisciplinary Neuro Oncology Clinic Follow-Up Visit Note  CC: [name], MD  Clydene Fake, MD  Diagnosis:   67 year old woman s/p partial resection of a multifocal left temporal grade IV gliosarcoma s/p radiotherapy to 60 Gy - 01/07/2012-02/18/2012  Interval Since Last Radiation:  1 months  Narrative:  The patient returns today for routine follow-up.  The recent films were presented in our multidisciplinary conference with neuroradiology just prior to the clinic.  She was seen at [facility] and enrolled on a registry trial for glioma.  She is continuing Temodar with [facility] and Dr. Welton Flakes. Patient is being weaned off of steroids.        ALLERGIES:  is allergic to codeine and compazine.  Meds: Current Outpatient Prescriptions  Medication Sig Dispense Refill  . aspirin EC 81 MG tablet Take 81 mg by mouth at bedtime.      Marland Kitchen buPROPion (WELLBUTRIN XL) 300 MG 24 hr tablet Take 300 mg by mouth every morning.      . Calcium Carbonate-Vitamin D (CALCIUM + D PO) Take 1 tablet by mouth 2 (two) times daily.      . cholecalciferol (VITAMIN D) 1000 UNITS tablet Take 1,000 Units by mouth daily.      . clonazePAM (KLONOPIN) 0.5 MG tablet Take 0.5 mg by mouth at bedtime as needed.      . clotrimazole (MYCELEX) 10 MG troche Take 1 lozenge (10 mg total) by mouth 3 (three) times daily.  21 lozenge  0  . dexamethasone (DECADRON) 4 MG tablet Take 1 mg by mouth daily.       . Melatonin 3 MG CAPS Take 1 capsule (3 mg total) by mouth at bedtime as needed.    0  . Multiple Vitamin (MULTIVITAMIN WITH MINERALS) TABS Take 1 tablet by mouth daily.      . pantoprazole (PROTONIX) 40 MG tablet Take 40 mg by mouth daily.      . simvastatin (ZOCOR) 40 MG tablet Take 40 mg by mouth at bedtime.      . valACYclovir (VALTREX) 500 MG tablet Take 1 tablet (500 mg total) by mouth  daily.  30 tablet  6  . ALPRAZolam (XANAX) 0.25 MG tablet Take 1-2 tablets (0.25-0.5 mg total) by mouth 3 (three) times daily as needed for sleep (15 minutes prior to radiation daily).  30 tablet  0  . HYDROcodone-acetaminophen (NORCO/VICODIN) 5-325 MG per tablet Take 1 tablet by mouth every 4 (four) hours as needed for pain.  51 tablet  0  . modafinil (PROVIGIL) 100 MG tablet Take 1 tablet (100 mg total) by mouth daily.  30 tablet  5  . nystatin-triamcinolone (MYCOLOG II) cream Apply 1 application topically daily.      Marland Kitchen omeprazole (PRILOSEC) 40 MG capsule       . ondansetron (ZOFRAN-ODT) 8 MG disintegrating tablet Take 1 tablet (8 mg total) by mouth every 8 (eight) hours as needed for nausea.  90 tablet  1  . temozolomide (TEMODAR) 20 MG capsule Take 1 capsule (20 mg total) by mouth daily. May take on an empty stomach or at bedtime to decrease nausea & vomiting. Pt takes total of 135 mg daily & has 100mg  & 5 mg capsules to make this dose.  21 capsule  0  . temozolomide (TEMODAR) 20 MG capsule Take 1 capsule (20 mg total) by  mouth daily. May take on an empty stomach or at bedtime to decrease nausea & vomiting.  15 capsule  3  . temozolomide (TEMODAR) 250 MG capsule Take 1 capsule (250 mg total) by mouth daily. May take on an empty stomach or at bedtime to decrease nausea & vomiting.  15 capsule  3  . temozolomide (TEMODAR) 5 MG capsule Take 1 capsule (5 mg total) by mouth daily. May take on an empty stomach or at bedtime to decrease nausea & vomiting.  15 capsule  3  . tretinoin (RETIN-A) 0.025 % cream Apply 1 application topically daily.        Physical Findings: The patient is in no acute distress. Patient is alert and oriented.  weight is 161 lb 3.2 oz (73.12 kg). Her oral temperature is 98.3 F (36.8 C). Her blood pressure is 133/72 and her pulse is 86. Her respiration is 20 and oxygen saturation is 97%. Marland Kitchen Speech fluent and articulate.  No significant changes.  Lab Findings: Lab Results    Component Value Date   WBC 6.8 02/14/2012   HGB 12.9 02/14/2012   HCT 38.7 02/14/2012   MCV 97.0 02/14/2012   PLT 220 02/14/2012    @LASTCHEM @  Radiographic Findings: Mr Laqueta Jean WU Contrast  03/11/2012  *RADIOLOGY REPORT*  Clinical Data: 67 year old female with history of high-grade glioma/gliosarcoma, status post surgical debulking.  Status post chemo-radiation. Radiation treatment completed 02/18/2012.  MRI HEAD WITHOUT AND WITH CONTRAST  Technique:  Multiplanar, multiecho pulse sequences of the brain and surrounding structures were obtained according to standard protocol without and with intravenous contrast  Contrast: 16mL MULTIHANCE GADOBENATE DIMEGLUMINE 529 MG/ML IV SOLN  Comparison: Postoperative study 12/19/2011.  Findings: Postcontrast axial images are degraded by some motion artifact.  Sequelae of left frontotemporal craniotomy.  Interval decreased edema and T2 signal abnormality in the left hemisphere. Interval resolved postoperative left subdural hematoma.  Stable to mildly decreased curvilinear and nodular enhancement in the inferior left frontal gyrus at the site of residual disease as seen on the postoperative study.  A punctate area representing the most anterior extent of enhancement on coronal image 41 seems to be new in comparison to the previous coronal, but is identified on the previous axial images.  Incidental superimposed developmental venous anomaly extending to the left frontal horn (best demonstrated on coronal image 41).  A small satellite nodule enhancement in the left caudate nucleus also is mildly decreased.  See series 11.  Small nodular rim enhancing area at the left temporal lobe also is decreased in size and shows mild residual nodular enhancement (series 14 image 32, series 11 image 88). Mildly decreased postoperative blood products at this site.  Mild smooth dural enhancement throughout the brain likely postoperative.  This is slightly thicker underlying the  craniotomy site, also felt to be postoperative.  . No other new or abnormal enhancement identified.  Diffusion signal including at the tumor site appears to be within normal limits throughout the brain. No restricted diffusion to suggest acute infarction.  No ventriculomegaly.  No midline shift. No new or progressive T2 or FLAIR signal abnormality.  Major intracranial vascular flow voids are stable.  Negative pituitary, cervicomedullary junction visualized cervical spine. Normal bone marrow signal. Visualized orbit soft tissues are within normal limits.  New moderate to severe paranasal sinus mucosal thickening throughout.  Mastoids are clear.  Small volume retained secretions in the nasopharynx.  No acute scalp soft tissue findings.  IMPRESSION: 1.  Stable to regressed residual tumor  enhancement in the left temporal and frontal lobes.  Significantly regressed / virtually resolved cerebral edema and mass effect.  No areas of progressive signal abnormality are identified. 2.  No new intracranial abnormality. 3.  New moderate to severe paranasal sinus inflammatory changes.   Original Report Authenticated By: Erskine Speed, M.D.    Impression:  The patient is recovering from the effects of radiation.  Her MRI shows a partial response.  Plan:  Follow-up MRI prior to [facility] visit on 05/05/12.  _____________________________________  Artist Pais. Kathrynn Running, M.D.

## 2012-03-30 NOTE — Progress Notes (Signed)
OFFICE PROGRESS NOTE  CC  [name],[name], MD [address] [place] [address] Dr. 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  PRIOR THERAPY:  #1 patient originally presented with 2-3 week loss of memory with trouble finding words. She had one episode of left parietal headache. Because of this she was seen by her internist an MRI of the brain was done on 12/13/2011 this showed a ring enhancing lesion in the left temporal lobe measuring 2.7 x 3.6 x 2.5 cm with 45 surrounding satellite lesions. She had a complete systemic workup including CT of the chest abdomen and pelvis that was negative. She went on to be seen by neurosurgery Dr. Phoebe Perch and patient underwent subtotal resection of the dominant lesion on October 14. 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 She went on to see Dr. Kennedy Bucker for 2 to who has been treating her she was also seen by Dr. Margaretmary Dys.  #2 patient was begun on radiation therapy with concurrent Temodar at 75 mg per meter squared. She completed concurrent Temodar and radiation therapy in December 2013.  #3 patient was seen at [facility] as well as [facility]. [facility] has recommended the patient began adjuvant Temodar at 150 milligrams per meter squared she tolerates the first cycle do we could increase the dose to 200 mg per meter squared days one through 5 on a 28 day cycle.  CURRENT THERAPY:patient will begin cycle #1 of Temodar days 1 through 5 starting on 03/21/2012-03/25/12  INTERVAL HISTORY: Kelly Maxwell 67 y.o. female returns for followup visit today.overall she feels well. She had a good trip and consultation at [facility]. She is also planning on going away tomorrow. Today she denies any nausea vomiting fevers chills night sweats headaches shortness of breath chest pains  palpitations no myalgias and arthralgias. Remainder of the 10 point review of systems is negative.   MEDICAL HISTORY: Past Medical History  Diagnosis Date  . PONV (postoperative nausea and vomiting)   . Depression   . GERD (gastroesophageal reflux disease)   . Headache   . HLD (hyperlipidemia)   . Brain tumor 12/18/2011    Left temporal lobe 2.7 x 3.6 x 2.5 cm ring enhancing; numerous small surrounding satellite lesions 12/13/11  . 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 Hiatal hernia   . History of radiation therapy 01/07/2012-02/18/12    left temporal     ALLERGIES:  is allergic to codeine and compazine.  MEDICATIONS:  Current Outpatient Prescriptions  Medication Sig Dispense Refill  . ALPRAZolam (XANAX) 0.25 MG tablet Take 1-2 tablets (0.25-0.5 mg total) by mouth 3 (three) times daily as needed for sleep (15 minutes prior to radiation daily).  30 tablet  0  . aspirin EC 81 MG tablet Take 81 mg by mouth at bedtime.      Marland Kitchen buPROPion (WELLBUTRIN XL) 300 MG 24 hr tablet Take 300 mg by mouth every morning.      . Calcium Carbonate-Vitamin D (CALCIUM + D PO) Take 1 tablet by mouth 2 (two) times daily.      . cholecalciferol (VITAMIN D) 1000 UNITS tablet Take 1,000 Units by mouth daily.      . clonazePAM (KLONOPIN) 0.5 MG tablet Take 0.5 mg by mouth at bedtime as needed.      . clotrimazole (MYCELEX) 10 MG  troche Take 1 lozenge (10 mg total) by mouth 3 (three) times daily.  21 lozenge  0  . dexamethasone (DECADRON) 4 MG tablet Take 2 mg by mouth 2 (two) times daily with a meal.       . estradiol (ESTRACE) 0.5 MG tablet Take 0.5 mg by mouth at bedtime.      Marland Kitchen HYDROcodone-acetaminophen (NORCO/VICODIN) 5-325 MG per tablet Take 1 tablet by mouth every 4 (four) hours as needed for pain.  51 tablet  0  . LORazepam (ATIVAN) 1 MG tablet Take 1 tablet (1 mg total) by mouth every 8 (eight) hours.  60 tablet  0  . medroxyPROGESTERone (PROVERA) 2.5 MG tablet Take 2.5 mg by  mouth at bedtime.      . Melatonin 3 MG CAPS Take 1 capsule (3 mg total) by mouth at bedtime as needed.    0  . modafinil (PROVIGIL) 100 MG tablet Take 1 tablet (100 mg total) by mouth daily.  30 tablet  5  . Multiple Vitamin (MULTIVITAMIN WITH MINERALS) TABS Take 1 tablet by mouth daily.      Marland Kitchen nystatin-triamcinolone (MYCOLOG II) cream Apply 1 application topically daily.      Marland Kitchen omeprazole (PRILOSEC) 40 MG capsule       . ondansetron (ZOFRAN-ODT) 8 MG disintegrating tablet Take 1 tablet (8 mg total) by mouth every 8 (eight) hours as needed for nausea.  90 tablet  1  . pantoprazole (PROTONIX) 40 MG tablet Take 40 mg by mouth daily.      . simvastatin (ZOCOR) 40 MG tablet Take 40 mg by mouth at bedtime.      . sulfamethoxazole-trimethoprim (BACTRIM DS,SEPTRA DS) 800-160 MG per tablet Take one tablet Monday-Wednesday-Friday  12 tablet  3  . temozolomide (TEMODAR) 20 MG capsule Take 1 capsule (20 mg total) by mouth daily. May take on an empty stomach or at bedtime to decrease nausea & vomiting. Pt takes total of 135 mg daily & has 100mg  & 5 mg capsules to make this dose.  21 capsule  0  . temozolomide (TEMODAR) 20 MG capsule Take 1 capsule (20 mg total) by mouth daily. May take on an empty stomach or at bedtime to decrease nausea & vomiting.  15 capsule  3  . temozolomide (TEMODAR) 250 MG capsule Take 1 capsule (250 mg total) by mouth daily. May take on an empty stomach or at bedtime to decrease nausea & vomiting.  15 capsule  3  . temozolomide (TEMODAR) 5 MG capsule Take 1 capsule (5 mg total) by mouth daily. May take on an empty stomach or at bedtime to decrease nausea & vomiting.  15 capsule  3  . tretinoin (RETIN-A) 0.025 % cream Apply 1 application topically daily.      . valACYclovir (VALTREX) 500 MG tablet Take 1 tablet (500 mg total) by mouth daily.  30 tablet  6  . zolpidem (AMBIEN) 10 MG tablet Take 10 mg by mouth at bedtime as needed.        SURGICAL HISTORY:  Past Surgical History    Procedure Date  . Total knee arthroplasty 2000    left  . Bunionectomy 2001    right  . Craniotomy 12/17/2011    Procedure: CRANIOTOMY TUMOR EXCISION;  Surgeon: Clydene Fake, MD;  Location: [facility];  Service: Neurosurgery;  Laterality: Left;  LEFT temporal craniotomy with stealth for tumor resection  . Dilation and curettage of uterus 2007    REVIEW OF SYSTEMS:  General: fatigue (-), night sweats (-), fever (-), pain (-) Lymph: palpable nodes (-) HEENT: vision changes (-), mucositis (-), gum bleeding (-), epistaxis (-) Cardiovascular: chest pain (-), palpitations (-) Pulmonary: shortness of breath (-), dyspnea on exertion (-), cough (-), hemoptysis (-) GI:  Early satiety (-), melena (-), dysphagia (-), nausea/vomiting (-), diarrhea (-) GU: dysuria (-), hematuria (-), incontinence (-) Musculoskeletal: joint swelling (-), joint pain (-), back pain (-) Neuro: weakness (-), numbness (-), headache (-), confusion (-) Skin: Rash (-), lesions (-), dryness (-) Psych: depression (-), suicidal/homicidal ideation (-), feeling of hopelessness (-)    PHYSICAL EXAMINATION: Blood pressure 162/94, pulse 121, temperature 98.8 F (37.1 C), temperature source Oral, resp. rate 20, height 5\' 4"  (1.626 m), weight 162 lb 4.8 oz (73.619 kg). Body mass index is 27.86 kg/(m^2). General: Patient is a well appearing female in no acute distress HEENT: PERRLA, sclerae anicteric no conjunctival pallor, MMM Neck: supple, no palpable adenopathy Lungs: clear to auscultation bilaterally, no wheezes, rhonchi, or rales Cardiovascular: regular rate rhythm, S1, S2, no murmurs, rubs or gallops Abdomen: Soft, non-tender, non-distended, normoactive bowel sounds, no HSM Extremities: warm and well perfused, no clubbing, cyanosis, or edema Skin: No rashes or lesions Neuro: Non-focal ECOG PERFORMANCE STATUS: 1 - Symptomatic but completely ambulatory   LABORATORY DATA: Lab Results  Component Value Date   WBC 6.8  02/14/2012   HGB 12.9 02/14/2012   HCT 38.7 02/14/2012   MCV 97.0 02/14/2012   PLT 220 02/14/2012      Chemistry      Component Value Date/Time   NA 142 02/14/2012 1239   NA 138 12/18/2011 0440   K 4.0 02/14/2012 1239   K 4.0 12/18/2011 0440   CL 108* 02/14/2012 1239   CL 107 12/18/2011 0440   CO2 26 02/14/2012 1239   CO2 22 12/18/2011 0440   BUN 20.0 02/14/2012 1239   BUN 17 12/18/2011 0440   CREATININE 0.7 02/14/2012 1239   CREATININE 0.50 12/18/2011 0440   CREATININE 0.50 12/12/2011 0000      Component Value Date/Time   CALCIUM 9.1 02/14/2012 1239   CALCIUM 8.7 12/18/2011 0440   ALKPHOS 50 02/14/2012 1239   AST 14 02/14/2012 1239   ALT 28 02/14/2012 1239   BILITOT 1.28* 02/14/2012 1239       RADIOGRAPHIC STUDIES:  No results found.  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 She is status post partial resection on 12/17/2011. She is now undergoing radiation concurrently with daily Temodar overall she is tolerating it well.  #2 after patient completes the Temodar we will plan on starting her on adjuvant Temodar  Her Temodar will be given days 1 through 5 on a 28 day cycle. These recommendations are per [facility] neuro oncology services.  I have discussed this plan of care with the patient as well as her daughter and husband.  #3 patient has appointment set up to be seen at [facility] as well as [facility].   PLAN:   #1 patient will now begin adjuvant Temodar at 150 mg per meter squared. This doses as recommended by [facility]. Prescription was sent to her pharmacy.  #2 she'll be seen back in one month's time for followup. We also recommended getting labs done approximately 2 weeks after she completes her Temodar.  All questions were answered. The patient knows to call the clinic with any problems, questions or concerns. We can certainly see the patient much sooner if necessary.  I spent 40  minutes counseling the patient face to face. The total time  spent in the appointment was 40 minutes.  This case was reviewed with dr. Welton Flakes.   Cherie Ouch Lyn Hollingshead, NP Medical Oncology [facility] Phone: [phone] (Office)  03/30/2012, 12:01 AM

## 2012-03-31 ENCOUNTER — Telehealth: Payer: Self-pay | Admitting: Oncology

## 2012-03-31 ENCOUNTER — Ambulatory Visit (HOSPITAL_BASED_OUTPATIENT_CLINIC_OR_DEPARTMENT_OTHER): Payer: Medicare Other | Admitting: Oncology

## 2012-03-31 ENCOUNTER — Encounter: Payer: Self-pay | Admitting: Oncology

## 2012-03-31 ENCOUNTER — Ambulatory Visit
Admission: RE | Admit: 2012-03-31 | Discharge: 2012-03-31 | Disposition: A | Discharge status: Home / Self Care | Payer: Medicare Other | Source: Ambulatory Visit | Attending: Radiation Oncology | Admitting: Radiation Oncology

## 2012-03-31 ENCOUNTER — Other Ambulatory Visit (HOSPITAL_BASED_OUTPATIENT_CLINIC_OR_DEPARTMENT_OTHER): Payer: Medicare Other | Admitting: Lab

## 2012-03-31 ENCOUNTER — Encounter: Payer: Self-pay | Admitting: Radiation Oncology

## 2012-03-31 VITALS — BP 133/72 | HR 86 | Temp 98.3°F | Resp 20 | Wt 161.2 lb

## 2012-03-31 VITALS — BP 133/83 | HR 80 | Temp 98.6°F | Resp 20 | Ht 64.0 in | Wt 160.6 lb

## 2012-03-31 DIAGNOSIS — D496 Neoplasm of unspecified behavior of brain: Secondary | ICD-10-CM

## 2012-03-31 DIAGNOSIS — C712 Malignant neoplasm of temporal lobe: Secondary | ICD-10-CM

## 2012-03-31 HISTORY — DX: Personal history of irradiation: Z92.3

## 2012-03-31 LAB — CBC WITH DIFFERENTIAL/PLATELET
BASO%: 0.4 % (ref 0.0–2.0)
EOS%: 1.9 % (ref 0.0–7.0)
MCH: 33.1 pg (ref 25.1–34.0)
MCV: 96.4 fL (ref 79.5–101.0)
MONO%: 5.8 % (ref 0.0–14.0)
RBC: 3.9 10*6/uL (ref 3.70–5.45)
RDW: 17.7 % — ABNORMAL HIGH (ref 11.2–14.5)

## 2012-03-31 LAB — COMPREHENSIVE METABOLIC PANEL (CC13)
ALT: 26 U/L (ref 0–55)
AST: 19 U/L (ref 5–34)
Albumin: 3.3 g/dL — ABNORMAL LOW (ref 3.5–5.0)
Alkaline Phosphatase: 56 U/L (ref 40–150)
Potassium: 4.1 mEq/L (ref 3.5–5.1)
Sodium: 142 mEq/L (ref 136–145)
Total Protein: 6.2 g/dL — ABNORMAL LOW (ref 6.4–8.3)

## 2012-03-31 NOTE — Telephone Encounter (Signed)
gv pt appt schedule for february.  °

## 2012-03-31 NOTE — Progress Notes (Signed)
Patient here follow up  Rad 01/07/12+02/18/12 multifocal temporal grade IV gliosarcoma Alert,oriented x3, energy level good, no c/o nausea, shortness breath, appetite good, no pain, headaches,  Off Temodar at moment,  MRI Kelly Maxwell 03/11/12 Has appt with lab and Dr.Khan this morning 9:53 AM

## 2012-03-31 NOTE — Patient Instructions (Addendum)
Doing well   Your cbc looks good.  You will return on 04/16/12 prior to starting cycle 2 of temodar. At that time your dose will be increased per [facility] protocol

## 2012-03-31 NOTE — Progress Notes (Signed)
OFFICE PROGRESS NOTE  CC  [name],[name], MD [address] [place] [address] 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  PRIOR THERAPY:  #1 patient originally presented with 2-3 week loss of memory with trouble finding words. She had one episode of left parietal headache. Because of this she was seen by her internist an MRI of the brain was done on 12/13/2011 this showed a ring enhancing lesion in the left temporal lobe measuring 2.7 x 3.6 x 2.5 cm with 45 surrounding satellite lesions. She had a complete systemic workup including CT of the chest abdomen and pelvis that was negative. She went on to be seen by neurosurgery Dr. Luiz Ochoa and patient underwent subtotal resection of the dominant lesion on October 14. 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 She went on to see Dr. Fatima Sanger for 2 to who has been treating her she was also seen by [name].  #2 patient was begun on radiation therapy with concurrent Temodar at 75 mg per meter squared. She completed concurrent Temodar and radiation therapy in December 2013.  #3 patient was seen at [facility] as well as [facility]. [facility] has recommended the patient began adjuvant Temodar at 150 milligrams per meter squared she tolerates the first cycle do we could increase the dose to 200 mg per meter squared days one through 5 on a 28 day cycle.  #4 patient began adjuvant Temodar cycle 1 on 03/13/12 through 03/18/12  CURRENT THERAPY:patient will begin cycle #2 of Temodar days 1 through 5 starting on 04/17/2012-04/21/12 dose will be 200 mg per meter squared with a total dose of 325 mg days one through 5  INTERVAL HISTORY: Kelly Maxwell 67 y.o. female returns for followup visit today.overall she feels well. Overall patient tolerated first cycle of  treatment well without any problems. She denies any fevers chills night sweats headaches shortness of breath chest pains palpitations no myalgias and arthralgias. Remainder of the 10 point review of systems is negative. MEDICAL HISTORY: Past Medical History  Diagnosis Date  . PONV (postoperative nausea and vomiting)   . Depression   . GERD (gastroesophageal reflux disease)   . Headache   . HLD (hyperlipidemia)   . Brain tumor 12/18/2011    Left temporal lobe 2.7 x 3.6 x 2.5 cm ring enhancing; numerous small surrounding satellite lesions 12/13/11  . 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 Hiatal hernia   . History of radiation therapy 01/07/2012-02/18/12    left temporal     ALLERGIES:  is allergic to codeine and compazine.  MEDICATIONS:  Current Outpatient Prescriptions  Medication Sig Dispense Refill  . ALPRAZolam (XANAX) 0.25 MG tablet Take 1-2 tablets (0.25-0.5 mg total) by mouth 3 (three) times daily as needed for sleep (15 minutes prior to radiation daily).  30 tablet  0  . aspirin EC 81 MG tablet Take 81 mg by mouth at bedtime.      Marland Kitchen buPROPion (WELLBUTRIN XL) 300 MG 24 hr tablet Take 300 mg by mouth every morning.      . Calcium Carbonate-Vitamin D (CALCIUM + D PO) Take 1 tablet by mouth 2 (two) times daily.      . cholecalciferol (VITAMIN D) 1000 UNITS tablet Take 1,000 Units by mouth daily.      . clonazePAM (KLONOPIN) 0.5 MG  tablet Take 0.5 mg by mouth at bedtime as needed.      . clotrimazole (MYCELEX) 10 MG troche Take 1 lozenge (10 mg total) by mouth 3 (three) times daily.  21 lozenge  0  . dexamethasone (DECADRON) 4 MG tablet Take 1 mg by mouth daily.       . Melatonin 3 MG CAPS Take 1 capsule (3 mg total) by mouth at bedtime as needed.    0  . modafinil (PROVIGIL) 100 MG tablet Take 1 tablet (100 mg total) by mouth daily.  30 tablet  5  . Multiple Vitamin (MULTIVITAMIN WITH MINERALS) TABS Take 1 tablet by mouth daily.      Marland Kitchen  nystatin-triamcinolone (MYCOLOG II) cream Apply 1 application topically daily.      Marland Kitchen omeprazole (PRILOSEC) 40 MG capsule       . ondansetron (ZOFRAN-ODT) 8 MG disintegrating tablet Take 1 tablet (8 mg total) by mouth every 8 (eight) hours as needed for nausea.  90 tablet  1  . pantoprazole (PROTONIX) 40 MG tablet Take 40 mg by mouth daily.      . simvastatin (ZOCOR) 40 MG tablet Take 40 mg by mouth at bedtime.      . temozolomide (TEMODAR) 20 MG capsule Take 1 capsule (20 mg total) by mouth daily. May take on an empty stomach or at bedtime to decrease nausea & vomiting. Pt takes total of 135 mg daily & has 100mg  & 5 mg capsules to make this dose.  21 capsule  0  . temozolomide (TEMODAR) 20 MG capsule Take 1 capsule (20 mg total) by mouth daily. May take on an empty stomach or at bedtime to decrease nausea & vomiting.  15 capsule  3  . temozolomide (TEMODAR) 250 MG capsule Take 1 capsule (250 mg total) by mouth daily. May take on an empty stomach or at bedtime to decrease nausea & vomiting.  15 capsule  3  . temozolomide (TEMODAR) 5 MG capsule Take 1 capsule (5 mg total) by mouth daily. May take on an empty stomach or at bedtime to decrease nausea & vomiting.  15 capsule  3  . tretinoin (RETIN-A) 0.025 % cream Apply 1 application topically daily.      . valACYclovir (VALTREX) 500 MG tablet Take 1 tablet (500 mg total) by mouth daily.  30 tablet  6  . HYDROcodone-acetaminophen (NORCO/VICODIN) 5-325 MG per tablet Take 1 tablet by mouth every 4 (four) hours as needed for pain.  51 tablet  0    SURGICAL HISTORY:  Past Surgical History  Procedure Date  . Total knee arthroplasty 2000    left  . Bunionectomy 2001    right  . Craniotomy 12/17/2011    Procedure: CRANIOTOMY TUMOR EXCISION;  Surgeon: Otilio Connors, MD;  Location: [place] [facility];  Service: Neurosurgery;  Laterality: Left;  LEFT temporal craniotomy with stealth for tumor resection  . Dilation and curettage of uterus 2007    REVIEW OF  SYSTEMS:   General: fatigue (-), night sweats (-), fever (-), pain (-) Lymph: palpable nodes (-) HEENT: vision changes (-), mucositis (-), gum bleeding (-), epistaxis (-) Cardiovascular: chest pain (-), palpitations (-) Pulmonary: shortness of breath (-), dyspnea on exertion (-), cough (-), hemoptysis (-) GI:  Early satiety (-), melena (-), dysphagia (-), nausea/vomiting (-), diarrhea (-) GU: dysuria (-), hematuria (-), incontinence (-) Musculoskeletal: joint swelling (-), joint pain (-), back pain (-) Neuro: weakness (-), numbness (-), headache (-), confusion (-) Skin: Rash (-),  lesions (-), dryness (-) Psych: depression (-), suicidal/homicidal ideation (-), feeling of hopelessness (-)    PHYSICAL EXAMINATION: Blood pressure 133/83, pulse 80, temperature 98.6 F (37 C), temperature source Oral, resp. rate 20, height 5\' 4"  (1.626 m), weight 160 lb 9.6 oz (72.848 kg). Body mass index is 27.57 kg/(m^2). General: Patient is a well appearing female in no acute distress HEENT: PERRLA, sclerae anicteric no conjunctival pallor, MMM Neck: supple, no palpable adenopathy Lungs: clear to auscultation bilaterally, no wheezes, rhonchi, or rales Cardiovascular: regular rate rhythm, S1, S2, no murmurs, rubs or gallops Abdomen: Soft, non-tender, non-distended, normoactive bowel sounds, no HSM Extremities: warm and well perfused, no clubbing, cyanosis, or edema Skin: No rashes or lesions Neuro: Non-focal ECOG PERFORMANCE STATUS: 1 - Symptomatic but completely ambulatory   LABORATORY DATA: Lab Results  Component Value Date   WBC 5.3 03/31/2012   HGB 12.9 03/31/2012   HCT 37.6 03/31/2012   MCV 96.4 03/31/2012   PLT 227 03/31/2012      Chemistry      Component Value Date/Time   NA 142 02/14/2012 1239   NA 138 12/18/2011 0440   K 4.0 02/14/2012 1239   K 4.0 12/18/2011 0440   CL 108* 02/14/2012 1239   CL 107 12/18/2011 0440   CO2 26 02/14/2012 1239   CO2 22 12/18/2011 0440   BUN 20.0  02/14/2012 1239   BUN 17 12/18/2011 0440   CREATININE 0.7 02/14/2012 1239   CREATININE 0.50 12/18/2011 0440   CREATININE 0.50 12/12/2011 0000      Component Value Date/Time   CALCIUM 9.1 02/14/2012 1239   CALCIUM 8.7 12/18/2011 0440   ALKPHOS 50 02/14/2012 1239   AST 14 02/14/2012 1239   ALT 28 02/14/2012 1239   BILITOT 1.28* 02/14/2012 1239       RADIOGRAPHIC STUDIES:  No results found.  ASSESSMENT: 67 year old female with  #1 WHO grade 4 glioblastoma multiforme. She is status post partial resection on 12/17/2011. She has completed Temodar and concurrent radiation therapy in December 2013.  #2 on 1/10/ 2014 through 03/18/2012 patient received adjuvant Temodar at 150 mg per meter squared. She has tolerated this dose very well. Because of good tolerance we will plan on increasing her dose to 200 mg per meter squared per [facility] recommendations. Her cycle 2 will begin on 04/17/2012 through 04/21/2012. She gets her Temodar from [facility] and we will order this prior to her next cycle. Her total dose of Temodar for cycle 2 will be 325 mg days one through 5.    PLAN:  #1 patient is doing well she will proceed with cycle #2 at a higher dose of Temodar starting on 05/15/2012 through 04/21/2012.  #2 she'll be seen on 04/16/2012 prior to beginning her cycle of Temodar.  #3 patient also has an appointment set up to be seen by [facility] in March.   All questions were answered. The patient knows to call the clinic with any problems, questions or concerns. We can certainly see the patient much sooner if necessary.  I spent 68minutes counseling the patient face to face. The total time spent in the appointment was 40 minutes.

## 2012-04-03 ENCOUNTER — Telehealth: Payer: Self-pay | Admitting: *Deleted

## 2012-04-03 NOTE — Telephone Encounter (Signed)
Patient called back has left eye droopy past couple weeks, and also  Some smells intolerable, , wants to know if the smells are from the chemotherapy and what iof anything about her left eye drooping from the surgery site,will inform MD and give patient a call back later today 2:38 PM

## 2012-04-03 NOTE — Telephone Encounter (Signed)
Returned call [phone] left voice message, she called and left message on [facility] phone, awaiting call back 2:32 PM

## 2012-04-03 NOTE — Telephone Encounter (Signed)
Called patient back informed her per MD these symptoms could be part of her disease or could be something new, and should be seen by an MD in the next week,  asked if she had an appt  With another MD to discuss this, she stated no, offered she could come in to see Dr. Kathrynn Running, this next week, she stated she would like to come in and speak with Him., transferred call to Adline Mango requesting a follow up with Dr.Manning sometime this next week 3:11 PM

## 2012-04-11 ENCOUNTER — Encounter: Payer: Self-pay | Admitting: *Deleted

## 2012-04-11 NOTE — Progress Notes (Signed)
RECEIVED A FAX FROM [facility] PATIENT ACCESS [facility]. PT. HAS REQUESTED COPAY ASSISTANCE. ACT PROGRAM FORMS GIVEN TO MANAGED CARE.

## 2012-04-14 ENCOUNTER — Encounter: Payer: Self-pay | Admitting: Adult Health

## 2012-04-15 ENCOUNTER — Other Ambulatory Visit (HOSPITAL_BASED_OUTPATIENT_CLINIC_OR_DEPARTMENT_OTHER): Payer: Medicare Other | Admitting: Lab

## 2012-04-15 DIAGNOSIS — D496 Neoplasm of unspecified behavior of brain: Secondary | ICD-10-CM

## 2012-04-15 DIAGNOSIS — C712 Malignant neoplasm of temporal lobe: Secondary | ICD-10-CM

## 2012-04-15 LAB — CBC WITH DIFFERENTIAL/PLATELET
Basophils Absolute: 0 10*3/uL (ref 0.0–0.1)
EOS%: 1.7 % (ref 0.0–7.0)
HGB: 13 g/dL (ref 11.6–15.9)
MCH: 31.9 pg (ref 25.1–34.0)
MCV: 99.3 fL (ref 79.5–101.0)
MONO%: 6.3 % (ref 0.0–14.0)
RBC: 4.08 10*6/uL (ref 3.70–5.45)
RDW: 15 % — ABNORMAL HIGH (ref 11.2–14.5)

## 2012-04-15 LAB — COMPREHENSIVE METABOLIC PANEL (CC13)
AST: 14 U/L (ref 5–34)
Albumin: 3.3 g/dL — ABNORMAL LOW (ref 3.5–5.0)
Alkaline Phosphatase: 50 U/L (ref 40–150)
BUN: 11.4 mg/dL (ref 7.0–26.0)
Potassium: 4.2 mEq/L (ref 3.5–5.1)

## 2012-04-16 ENCOUNTER — Other Ambulatory Visit: Payer: Self-pay | Admitting: Emergency Medicine

## 2012-04-16 ENCOUNTER — Encounter: Payer: Self-pay | Admitting: Adult Health

## 2012-04-16 ENCOUNTER — Ambulatory Visit (HOSPITAL_BASED_OUTPATIENT_CLINIC_OR_DEPARTMENT_OTHER): Payer: Medicare Other | Admitting: Adult Health

## 2012-04-16 ENCOUNTER — Other Ambulatory Visit: Payer: Medicare Other | Admitting: Lab

## 2012-04-16 VITALS — BP 147/96 | HR 94 | Temp 98.5°F | Resp 20 | Ht 64.0 in | Wt 160.2 lb

## 2012-04-16 DIAGNOSIS — D496 Neoplasm of unspecified behavior of brain: Secondary | ICD-10-CM

## 2012-04-16 DIAGNOSIS — C712 Malignant neoplasm of temporal lobe: Secondary | ICD-10-CM

## 2012-04-16 MED ORDER — TEMOZOLOMIDE 5 MG PO CAPS: ORAL_CAPSULE | ORAL | Status: DC

## 2012-04-16 MED ORDER — TEMOZOLOMIDE 250 MG PO CAPS: ORAL_CAPSULE | ORAL | Status: DC

## 2012-04-16 MED ORDER — TEMOZOLOMIDE 100 MG PO CAPS: ORAL_CAPSULE | ORAL | Status: DC

## 2012-04-16 NOTE — Progress Notes (Signed)
OFFICE PROGRESS NOTE  CC  [name],[name], MD [address] [place] [address] 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  PRIOR THERAPY:  #1 patient originally presented with 2-3 week loss of memory with trouble finding words. She had one episode of left parietal headache. Because of this she was seen by her internist an MRI of the brain was done on 12/13/2011 this showed a ring enhancing lesion in the left temporal lobe measuring 2.7 x 3.6 x 2.5 cm with 45 surrounding satellite lesions. She had a complete systemic workup including CT of the chest abdomen and pelvis that was negative. She went on to be seen by neurosurgery Dr. Luiz Ochoa and patient underwent subtotal resection of the dominant lesion on October 14. 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 She went on to see Dr. Fatima Sanger for 2 to who has been treating her she was also seen by [name].  #2 patient was begun on radiation therapy with concurrent Temodar at 75 mg per meter squared. She completed concurrent Temodar and radiation therapy in December 2013.  #3 patient was seen at [facility] as well as [facility]. [facility] has recommended the patient began adjuvant Temodar at 150 milligrams per meter squared she tolerates the first cycle do we could increase the dose to 200 mg per meter squared days one through 5 on a 28 day cycle.  #4 patient began adjuvant Temodar cycle 1 on 03/13/12 through 03/18/12  CURRENT THERAPY:patient will begin cycle #2 of Temodar days 1 through 5 starting on 04/17/2012-04/21/12 dose will be 200 mg per meter squared with a total dose of 360 mg days one through 5  INTERVAL HISTORY: Kelly Maxwell 67 y.o. female returns for followup visit today.overall she feels well. She's doing well today.  She has some left eye  ptosis that started several weeks ago.  This was discussed last week with Dr. Caesar Bookman staff at [facility].  She has mild nausea a week or two after she takes her Temodar pills, but otherwise is feeling well and w/o concerns.    MEDICAL HISTORY: Past Medical History  Diagnosis Date  . PONV (postoperative nausea and vomiting)   . Depression   . GERD (gastroesophageal reflux disease)   . Headache   . HLD (hyperlipidemia)   . Brain tumor 12/18/2011    Left temporal lobe 2.7 x 3.6 x 2.5 cm ring enhancing; numerous small surrounding satellite lesions 12/13/11  . 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 Hiatal hernia   . History of radiation therapy 01/07/2012-02/18/12    left temporal     ALLERGIES:  is allergic to codeine and compazine.  MEDICATIONS:  Current Outpatient Prescriptions  Medication Sig Dispense Refill  . ALPRAZolam (XANAX) 0.25 MG tablet Take 1-2 tablets (0.25-0.5 mg total) by mouth 3 (three) times daily as needed for sleep (15 minutes prior to radiation daily).  30 tablet  0  . aspirin EC 81 MG tablet Take 81 mg by mouth at bedtime.      Marland Kitchen buPROPion (WELLBUTRIN XL) 300 MG 24 hr tablet Take 300 mg by mouth every morning.      . Calcium Carbonate-Vitamin D (CALCIUM + D PO) Take 1 tablet by mouth 2 (two) times daily.      . cholecalciferol (VITAMIN D) 1000 UNITS tablet Take  1,000 Units by mouth daily.      . clonazePAM (KLONOPIN) 0.5 MG tablet Take 0.5 mg by mouth at bedtime as needed.      . clotrimazole (MYCELEX) 10 MG troche Take 1 lozenge (10 mg total) by mouth 3 (three) times daily.  21 lozenge  0  . dexamethasone (DECADRON) 4 MG tablet Take 1 mg by mouth daily.       . Melatonin 3 MG CAPS Take 1 capsule (3 mg total) by mouth at bedtime as needed.    0  . modafinil (PROVIGIL) 100 MG tablet Take 1 tablet (100 mg total) by mouth daily.  30 tablet  5  . Multiple Vitamin (MULTIVITAMIN WITH MINERALS) TABS Take 1 tablet by mouth daily.      Marland Kitchen omeprazole  (PRILOSEC) 40 MG capsule       . ondansetron (ZOFRAN-ODT) 8 MG disintegrating tablet Take 1 tablet (8 mg total) by mouth every 8 (eight) hours as needed for nausea.  90 tablet  1  . pantoprazole (PROTONIX) 40 MG tablet Take 40 mg by mouth daily.      . simvastatin (ZOCOR) 40 MG tablet Take 40 mg by mouth at bedtime.      . tretinoin (RETIN-A) 0.025 % cream Apply 1 application topically daily.      . valACYclovir (VALTREX) 500 MG tablet Take 1 tablet (500 mg total) by mouth daily.  30 tablet  6  . HYDROcodone-acetaminophen (NORCO/VICODIN) 5-325 MG per tablet Take 1 tablet by mouth every 4 (four) hours as needed for pain.  51 tablet  0  . nystatin-triamcinolone (MYCOLOG II) cream Apply 1 application topically daily.      Marland Kitchen temozolomide (TEMODAR) 20 MG capsule Take 1 capsule (20 mg total) by mouth daily. May take on an empty stomach or at bedtime to decrease nausea & vomiting. Pt takes total of 135 mg daily & has 100mg  & 5 mg capsules to make this dose.  21 capsule  0  . temozolomide (TEMODAR) 20 MG capsule Take 1 capsule (20 mg total) by mouth daily. May take on an empty stomach or at bedtime to decrease nausea & vomiting.  15 capsule  3  . temozolomide (TEMODAR) 250 MG capsule Take 1 capsule (250 mg total) by mouth daily. May take on an empty stomach or at bedtime to decrease nausea & vomiting.  15 capsule  3  . temozolomide (TEMODAR) 5 MG capsule Take 1 capsule (5 mg total) by mouth daily. May take on an empty stomach or at bedtime to decrease nausea & vomiting.  15 capsule  3   No current facility-administered medications for this visit.    SURGICAL HISTORY:  Past Surgical History  Procedure Laterality Date  . Total knee arthroplasty  2000    left  . Bunionectomy  2001    right  . Craniotomy  12/17/2011    Procedure: CRANIOTOMY TUMOR EXCISION;  Surgeon: Otilio Connors, MD;  Location: [place] [facility];  Service: Neurosurgery;  Laterality: Left;  LEFT temporal craniotomy with stealth for tumor  resection  . Dilation and curettage of uterus  2007    REVIEW OF SYSTEMS:   General: fatigue (-), night sweats (-), fever (-), pain (-) Lymph: palpable nodes (-) HEENT: vision changes (-), mucositis (-), gum bleeding (-), epistaxis (-) Cardiovascular: chest pain (-), palpitations (-) Pulmonary: shortness of breath (-), dyspnea on exertion (-), cough (-), hemoptysis (-) GI:  Early satiety (-), melena (-), dysphagia (-), nausea/vomiting (-), diarrhea (-)  GU: dysuria (-), hematuria (-), incontinence (-) Musculoskeletal: joint swelling (-), joint pain (-), back pain (-) Neuro: weakness (-), numbness (-), headache (-), confusion (-) Skin: Rash (-), lesions (-), dryness (-) Psych: depression (-), suicidal/homicidal ideation (-), feeling of hopelessness (-)    PHYSICAL EXAMINATION: Blood pressure 147/96, pulse 94, temperature 98.5 F (36.9 C), temperature source Oral, resp. rate 20, height 5\' 4"  (1.626 m), weight 160 lb 3.2 oz (72.666 kg). Body mass index is 27.48 kg/(m^2). General: Patient is a well appearing female in no acute distress HEENT: PERRLA, sclerae anicteric no conjunctival pallor, MMM Neck: supple, no palpable adenopathy Lungs: clear to auscultation bilaterally, no wheezes, rhonchi, or rales Cardiovascular: regular rate rhythm, S1, S2, no murmurs, rubs or gallops Abdomen: Soft, non-tender, non-distended, normoactive bowel sounds, no HSM Extremities: warm and well perfused, no clubbing, cyanosis, or edema Skin: No rashes or lesions Neuro: CN II-XII intact ECOG PERFORMANCE STATUS: 1 - Symptomatic but completely ambulatory   LABORATORY DATA: Lab Results  Component Value Date   WBC 5.2 04/15/2012   HGB 13.0 04/15/2012   HCT 40.5 04/15/2012   MCV 99.3 04/15/2012   PLT 245 04/15/2012      Chemistry      Component Value Date/Time   NA 145 04/15/2012 0903   NA 138 12/18/2011 0440   K 4.2 04/15/2012 0903   K 4.0 12/18/2011 0440   CL 109* 04/15/2012 0903   CL 107 12/18/2011  0440   CO2 29 04/15/2012 0903   CO2 22 12/18/2011 0440   BUN 11.4 04/15/2012 0903   BUN 17 12/18/2011 0440   CREATININE 0.7 04/15/2012 0903   CREATININE 0.50 12/18/2011 0440   CREATININE 0.50 12/12/2011 0000      Component Value Date/Time   CALCIUM 9.8 04/15/2012 0903   CALCIUM 8.7 12/18/2011 0440   ALKPHOS 50 04/15/2012 0903   AST 14 04/15/2012 0903   ALT 21 04/15/2012 0903   BILITOT 0.43 04/15/2012 0903       RADIOGRAPHIC STUDIES:  No results found.  ASSESSMENT: 67 year old female with  #1 WHO grade 4 glioblastoma multiforme. She is status post partial resection on 12/17/2011. She has completed Temodar and concurrent radiation therapy in December 2013.  #2 on 1/10/ 2014 through 03/18/2012 patient received adjuvant Temodar at 150 mg per meter squared. She has tolerated this dose very well. Because of good tolerance we will plan on increasing her dose to 200 mg per meter squared per [facility] recommendations. Her cycle 2 will begin on 04/17/2012 through 04/21/2012. She gets her Temodar from [facility] and we will order this prior to her next cycle. Her total dose of Temodar for cycle 2 will be 325 mg days one through 5.    PLAN:  #1 patient is doing well she will proceed with cycle #2 at a higher dose of Temodar starting on 04/17/2012 through 04/21/2012.  #2 she'll be seen next week for labs, and then prior to cycle 3 of treatment.   #3 patient also has an appointment set up to be seen by [facility] in March.   All questions were answered. The patient knows to call the clinic with any problems, questions or concerns. We can certainly see the patient much sooner if necessary.  I spent 25 minutes counseling the patient face to face. The total time spent in the appointment was 30 minutes.  Suzan Garibaldi Sheppard Co[place], [place] Phone: [phone]

## 2012-04-16 NOTE — Patient Instructions (Addendum)
Doing well.  You will start taking Temodar, a total of 360 mg, one 250mg  tablet, one 100mg  tablet, and 2 5mg  tablets, daily for five days.  You can try taking nausea medication such as Zofran 30 minutes before to eliminate nausea.  We will see you back in 1 week for labs, 4 weeks for follow up.    Please call us if you have any questions or concerns.

## 2012-04-17 ENCOUNTER — Other Ambulatory Visit: Payer: Self-pay | Admitting: *Deleted

## 2012-04-17 ENCOUNTER — Telehealth: Payer: Self-pay | Admitting: *Deleted

## 2012-04-17 ENCOUNTER — Ambulatory Visit: Payer: Medicare Other | Admitting: Radiation Oncology

## 2012-04-17 NOTE — Telephone Encounter (Signed)
April with [employer] called reporting patient's spouse called them reporting she is to have an increase in Temodar and need receipt by Saturday 04-19-2012.  [facility] would like order today for "[employer] in distribution to ship tomorrow.  Distribution closed today due to weather."  Will notify providers.  April can be reached at [phone] ext. [phone].

## 2012-04-17 NOTE — Telephone Encounter (Signed)
VAL DODD,RN WILL NOTIFY [facility] OF THE INCREASED DOSE OF TEMODAR. [facility] WILL CONTACT PT. CONCERNING SHIPMENT OF THIS MEDICATION. NOTIFIED PT. OF THE ABOVE INFORMATION. SHE VOICES UNDERSTANDING.

## 2012-04-19 ENCOUNTER — Other Ambulatory Visit: Payer: Self-pay

## 2012-04-19 ENCOUNTER — Telehealth: Payer: Self-pay | Admitting: Oncology

## 2012-04-19 NOTE — Telephone Encounter (Signed)
Medical Oncology on call  Husband called as there has been delay receiving temodar from [facility] due to weather. This cycle was supposed to begin 04-16-12. Husband is very anxious about delay; I have reassured him that a few days should not have significant adverse effect on cancer. This drug is not stocked at [facility] so cannot start treatment from any supply here. Husband appreciated call.  Ila Mcgill, MD

## 2012-04-22 ENCOUNTER — Telehealth: Payer: Self-pay | Admitting: Medical Oncology

## 2012-04-22 ENCOUNTER — Encounter: Payer: Self-pay | Admitting: *Deleted

## 2012-04-22 NOTE — Telephone Encounter (Signed)
Pt LVMOM asking if she needed to keep her lab appt sched 02/20. Per NP, informed patient that we need a CBC only and for patient to keep appt. Patient expressed understanding. Pt also stated she received the Temodar in mail today. Patient with no further questions, knows to call office with any questions or concerns.

## 2012-04-22 NOTE — Progress Notes (Signed)
RECEIVED A FAX FROM [facility] CONCERNING A CONFIRMATION OF PRESCRIPTION SHIPMENT FOR TEMOZOLOMIDE ON 04/21/12.

## 2012-04-24 ENCOUNTER — Other Ambulatory Visit: Payer: Medicare Other | Admitting: Lab

## 2012-04-24 DIAGNOSIS — D496 Neoplasm of unspecified behavior of brain: Secondary | ICD-10-CM

## 2012-04-24 LAB — COMPREHENSIVE METABOLIC PANEL (CC13)
ALT: 30 U/L (ref 0–55)
AST: 22 U/L (ref 5–34)
Albumin: 3.4 g/dL — ABNORMAL LOW (ref 3.5–5.0)
Alkaline Phosphatase: 53 U/L (ref 40–150)
BUN: 10.1 mg/dL (ref 7.0–26.0)
CO2: 28 mEq/L (ref 22–29)
Calcium: 9.5 mg/dL (ref 8.4–10.4)
Chloride: 107 mEq/L (ref 98–107)
Creatinine: 0.8 mg/dL (ref 0.6–1.1)
Glucose: 84 mg/dl (ref 70–99)
Potassium: 3.4 mEq/L — ABNORMAL LOW (ref 3.5–5.1)
Sodium: 143 mEq/L (ref 136–145)
Total Bilirubin: 0.66 mg/dL (ref 0.20–1.20)
Total Protein: 6.1 g/dL — ABNORMAL LOW (ref 6.4–8.3)

## 2012-04-24 LAB — CBC WITH DIFFERENTIAL/PLATELET
Basophils Absolute: 0 10*3/uL (ref 0.0–0.1)
EOS%: 2.6 % (ref 0.0–7.0)
HCT: 39.1 % (ref 34.8–46.6)
HGB: 13.4 g/dL (ref 11.6–15.9)
LYMPH%: 12 % — ABNORMAL LOW (ref 14.0–49.7)
MCH: 32.9 pg (ref 25.1–34.0)
MCV: 95.9 fL (ref 79.5–101.0)
MONO%: 4.9 % (ref 0.0–14.0)
NEUT%: 80.2 % — ABNORMAL HIGH (ref 38.4–76.8)
Platelets: 227 10*3/uL (ref 145–400)

## 2012-04-26 ENCOUNTER — Other Ambulatory Visit: Payer: Self-pay | Admitting: Hematology & Oncology

## 2012-04-26 DIAGNOSIS — D496 Neoplasm of unspecified behavior of brain: Secondary | ICD-10-CM

## 2012-04-26 MED ORDER — PROMETHAZINE HCL 25 MG RE SUPP: 25.0000 mg | Freq: Four times a day (QID) | RECTAL | Status: DC | PRN

## 2012-04-28 ENCOUNTER — Other Ambulatory Visit: Payer: Self-pay | Admitting: Emergency Medicine

## 2012-04-28 ENCOUNTER — Ambulatory Visit (HOSPITAL_BASED_OUTPATIENT_CLINIC_OR_DEPARTMENT_OTHER): Payer: Medicare Other

## 2012-04-28 ENCOUNTER — Telehealth: Payer: Self-pay | Admitting: Oncology

## 2012-04-28 ENCOUNTER — Ambulatory Visit (HOSPITAL_BASED_OUTPATIENT_CLINIC_OR_DEPARTMENT_OTHER): Payer: Medicare Other | Admitting: Oncology

## 2012-04-28 ENCOUNTER — Encounter: Payer: Self-pay | Admitting: Oncology

## 2012-04-28 ENCOUNTER — Other Ambulatory Visit (HOSPITAL_BASED_OUTPATIENT_CLINIC_OR_DEPARTMENT_OTHER): Payer: Medicare Other | Admitting: Lab

## 2012-04-28 VITALS — BP 138/85 | HR 108 | Temp 98.7°F | Resp 18 | Wt 155.0 lb

## 2012-04-28 DIAGNOSIS — C712 Malignant neoplasm of temporal lobe: Secondary | ICD-10-CM

## 2012-04-28 DIAGNOSIS — E860 Dehydration: Secondary | ICD-10-CM

## 2012-04-28 DIAGNOSIS — D496 Neoplasm of unspecified behavior of brain: Secondary | ICD-10-CM

## 2012-04-28 DIAGNOSIS — R112 Nausea with vomiting, unspecified: Secondary | ICD-10-CM

## 2012-04-28 LAB — CBC WITH DIFFERENTIAL/PLATELET
BASO%: 0.2 % (ref 0.0–2.0)
HCT: 44.3 % (ref 34.8–46.6)
HGB: 15.2 g/dL (ref 11.6–15.9)
LYMPH%: 3.5 % — ABNORMAL LOW (ref 14.0–49.7)
MCHC: 34.3 g/dL (ref 31.5–36.0)
MONO#: 0.6 10*3/uL (ref 0.1–0.9)
NEUT#: 10.9 10*3/uL — ABNORMAL HIGH (ref 1.5–6.5)
RBC: 4.64 10*6/uL (ref 3.70–5.45)
RDW: 15.4 % — ABNORMAL HIGH (ref 11.2–14.5)

## 2012-04-28 LAB — COMPREHENSIVE METABOLIC PANEL (CC13)
ALT: 35 U/L (ref 0–55)
Alkaline Phosphatase: 65 U/L (ref 40–150)
CO2: 24 mEq/L (ref 22–29)
Potassium: 3.7 mEq/L (ref 3.5–5.1)
Sodium: 139 mEq/L (ref 136–145)
Total Bilirubin: 1.45 mg/dL — ABNORMAL HIGH (ref 0.20–1.20)
Total Protein: 6.6 g/dL (ref 6.4–8.3)

## 2012-04-28 MED ORDER — SODIUM CHLORIDE 0.9 % IV SOLN
Freq: Once | INTRAVENOUS | Status: AC
  Administered 2012-04-28: 14:00:00 via INTRAVENOUS

## 2012-04-28 MED ORDER — SODIUM CHLORIDE 0.9 % IV SOLN
Freq: Once | INTRAVENOUS | Status: AC
  Administered 2012-04-28: 12:00:00 via INTRAVENOUS

## 2012-04-28 MED ORDER — ONDANSETRON 16 MG/50ML IVPB (CHCC)
16.0000 mg | Freq: Once | INTRAVENOUS | Status: AC
  Administered 2012-04-28: 16 mg via INTRAVENOUS

## 2012-04-28 NOTE — Patient Instructions (Addendum)
IVF and zofran today and tomorrow

## 2012-04-28 NOTE — Patient Instructions (Addendum)
Dehydration, Adult Dehydration is when you lose more fluids from the body than you take in. Vital organs like the kidneys, brain, and heart cannot function without a proper amount of fluids and salt. Any loss of fluids from the body can cause dehydration.  CAUSES   Vomiting.  Diarrhea.  Excessive sweating.  Excessive urine output.  Fever. SYMPTOMS  Mild dehydration  Thirst.  Dry lips.  Slightly dry mouth. Moderate dehydration  Very dry mouth.  Sunken eyes.  Skin does not bounce back quickly when lightly pinched and released.  Dark urine and decreased urine production.  Decreased tear production.  Headache. Severe dehydration  Very dry mouth.  Extreme thirst.  Rapid, weak pulse (more than 100 beats per minute at rest).  Cold hands and feet.  Not able to sweat in spite of heat and temperature.  Rapid breathing.  Blue lips.  Confusion and lethargy.  Difficulty being awakened.  Minimal urine production.  No tears. DIAGNOSIS  Your caregiver will diagnose dehydration based on your symptoms and your exam. Blood and urine tests will help confirm the diagnosis. The diagnostic evaluation should also identify the cause of dehydration. TREATMENT  Treatment of mild or moderate dehydration can often be done at home by increasing the amount of fluids that you drink. It is best to drink small amounts of fluid more often. Drinking too much at one time can make vomiting worse. Refer to the home care instructions below. Severe dehydration needs to be treated at the hospital where you will probably be given intravenous (IV) fluids that contain water and electrolytes. HOME CARE INSTRUCTIONS   Ask your caregiver about specific rehydration instructions.  Drink enough fluids to keep your urine clear or pale yellow.  Drink small amounts frequently if you have nausea and vomiting.  Eat as you normally do.  Avoid:  Foods or drinks high in sugar.  Carbonated  drinks.  Juice.  Extremely hot or cold fluids.  Drinks with caffeine.  Fatty, greasy foods.  Alcohol.  Tobacco.  Overeating.  Gelatin desserts.  Wash your hands well to avoid spreading bacteria and viruses.  Only take over-the-counter or prescription medicines for pain, discomfort, or fever as directed by your caregiver.  Ask your caregiver if you should continue all prescribed and over-the-counter medicines.  Keep all follow-up appointments with your caregiver. SEEK MEDICAL CARE IF:  You have abdominal pain and it increases or stays in one area (localizes).  You have a rash, stiff neck, or severe headache.  You are irritable, sleepy, or difficult to awaken.  You are weak, dizzy, or extremely thirsty. SEEK IMMEDIATE MEDICAL CARE IF:   You are unable to keep fluids down or you get worse despite treatment.  You have frequent episodes of vomiting or diarrhea.  You have blood or green matter (bile) in your vomit.  You have blood in your stool or your stool looks black and tarry.  You have not urinated in 6 to 8 hours, or you have only urinated a small amount of very dark urine.  You have a fever.  You faint. MAKE SURE YOU:   Understand these instructions.  Will watch your condition.  Will get help right away if you are not doing well or get worse. Document Released: 02/19/2005 Document Revised: 05/14/2011 Document Reviewed: 10/09/2010 [employer] Patient Information 2013 [employer].  

## 2012-04-28 NOTE — Telephone Encounter (Signed)
S/w chemo scheduler re IVF's for today and tomorrow. She will add appts and pt will be given a new schedule in inf. No other changes.

## 2012-04-28 NOTE — Progress Notes (Signed)
OFFICE PROGRESS NOTE  CC  [name],[name], MD [address] [place] [address] 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  PRIOR THERAPY:  #1 patient originally presented with 2-3 week loss of memory with trouble finding words. She had one episode of left parietal headache. Because of this she was seen by her internist an MRI of the brain was done on 12/13/2011 this showed a ring enhancing lesion in the left temporal lobe measuring 2.7 x 3.6 x 2.5 cm with 45 surrounding satellite lesions. She had a complete systemic workup including CT of the chest abdomen and pelvis that was negative. She went on to be seen by neurosurgery Dr. Luiz Ochoa and patient underwent subtotal resection of the dominant lesion on October 14. 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 She went on to see Dr. Fatima Sanger for 2 to who has been treating her she was also seen by [name].  #2 patient was begun on radiation therapy with concurrent Temodar at 75 mg per meter squared. She completed concurrent Temodar and radiation therapy in December 2013.  #3 patient was seen at [facility] as well as [facility]. [facility] has recommended the patient began adjuvant Temodar at 150 milligrams per meter squared she tolerates the first cycle do we could increase the dose to 200 mg per meter squared days one through 5 on a 28 day cycle.  #4 patient began adjuvant Temodar cycle 1 on 03/13/12 through 03/18/12  CURRENT THERAPY:patient will begin cycle #2 of Temodar days 1 through 5 starting on 04/17/2012-04/21/12 dose will be 200 mg per meter squared with a total dose of 360 mg days one through 5  INTERVAL HISTORY: Kelly Maxwell 67 y.o. female returns for urgent visit today. Over the weekend patient began having nausea vomiting with poor by mouth  intake weakness and fatigue. She states this occurred after she completed the Temodar at 200 mg per meter squared for a total dose of 360 mg days one through 5. She has not had any fevers but she does have chills. She also noted some minor rectal bleeding. There is no pain associated with it.  MEDICAL HISTORY: Past Medical History  Diagnosis Date  . PONV (postoperative nausea and vomiting)   . Depression   . GERD (gastroesophageal reflux disease)   . Headache   . HLD (hyperlipidemia)   . Brain tumor 12/18/2011    Left temporal lobe 2.7 x 3.6 x 2.5 cm ring enhancing; numerous small surrounding satellite lesions 12/13/11  . 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 Hiatal hernia   . History of radiation therapy 01/07/2012-02/18/12    left temporal     ALLERGIES:  is allergic to codeine and compazine.  MEDICATIONS:  Current Outpatient Prescriptions  Medication Sig Dispense Refill  . ALPRAZolam (XANAX) 0.25 MG tablet Take 1-2 tablets (0.25-0.5 mg total) by mouth 3 (three) times daily as needed for sleep (15 minutes prior to radiation daily).  30 tablet  0  . aspirin EC 81 MG tablet Take 81 mg by mouth at bedtime.      Marland Kitchen buPROPion (WELLBUTRIN XL) 300 MG 24 hr tablet Take 300 mg by mouth every morning.      . Calcium Carbonate-Vitamin D (CALCIUM + D PO) Take 1 tablet by mouth 2 (two) times daily.      Marland Kitchen  cholecalciferol (VITAMIN D) 1000 UNITS tablet Take 1,000 Units by mouth daily.      . clonazePAM (KLONOPIN) 0.5 MG tablet Take 0.5 mg by mouth at bedtime as needed.      . clotrimazole (MYCELEX) 10 MG troche Take 1 lozenge (10 mg total) by mouth 3 (three) times daily.  21 lozenge  0  . dexamethasone (DECADRON) 4 MG tablet Take 1 mg by mouth daily.       Marland Kitchen HYDROcodone-acetaminophen (NORCO/VICODIN) 5-325 MG per tablet Take 1 tablet by mouth every 4 (four) hours as needed for pain.  51 tablet  0  . Melatonin 3 MG CAPS Take 1 capsule (3 mg total) by mouth at bedtime as  needed.    0  . modafinil (PROVIGIL) 100 MG tablet Take 1 tablet (100 mg total) by mouth daily.  30 tablet  5  . Multiple Vitamin (MULTIVITAMIN WITH MINERALS) TABS Take 1 tablet by mouth daily.      Marland Kitchen nystatin-triamcinolone (MYCOLOG II) cream Apply 1 application topically daily.      Marland Kitchen omeprazole (PRILOSEC) 40 MG capsule       . ondansetron (ZOFRAN-ODT) 8 MG disintegrating tablet Take 1 tablet (8 mg total) by mouth every 8 (eight) hours as needed for nausea.  90 tablet  1  . pantoprazole (PROTONIX) 40 MG tablet Take 40 mg by mouth daily.      . promethazine (PHENERGAN) 25 MG suppository Place 1 suppository (25 mg total) rectally every 6 (six) hours as needed for nausea.  20 each  3  . simvastatin (ZOCOR) 40 MG tablet Take 40 mg by mouth at bedtime.      . tretinoin (RETIN-A) 0.025 % cream Apply 1 application topically daily.      . valACYclovir (VALTREX) 500 MG tablet Take 1 tablet (500 mg total) by mouth daily.  30 tablet  6  . temozolomide (TEMODAR) 100 MG capsule Take one 100 mg tablet daily with two 5 mg tablets and one 250 mg tablet for a total daily dose of 360 mgs for 5 days starting on 2/13 and ending on 2/17. May take on an empty stomach or at bedtime to decrease nausea & vomiting.  5 capsule  0  . temozolomide (TEMODAR) 20 MG capsule Take 1 capsule (20 mg total) by mouth daily. May take on an empty stomach or at bedtime to decrease nausea & vomiting. Pt takes total of 135 mg daily & has 100mg  & 5 mg capsules to make this dose.  21 capsule  0  . temozolomide (TEMODAR) 20 MG capsule Take 1 capsule (20 mg total) by mouth daily. May take on an empty stomach or at bedtime to decrease nausea & vomiting.  15 capsule  3  . temozolomide (TEMODAR) 250 MG capsule Take 1 tablet (250 mg) in addition to one 100 mg and two 5 mg tablets for total dose of 360 mg for 5 days starting on 2/13 and ending on 2/17. May take on an empty stomach or at bedtime to decrease nausea & vomiting.  5 capsule  0  .  temozolomide (TEMODAR) 5 MG capsule Take two 5 mg tablets with one 250 mg and one 100 mg for a total daily dose of 360 mgs for 5 days starting on 2/13 and ending on 2/17. May take on an empty stomach or at bedtime to decrease nausea & vomiting.  10 capsule  0  . temozolomide (TEMODAR) 5 MG capsule Take two 5 mg tablets daily with one  250 mg and one 100 mg for a total daily dose of 360 mgs for 5 days starting on 2/13 and ending on 2/17. May take on an empty stomach or at bedtime to decrease nausea & vomiting.  10 capsule  0   No current facility-administered medications for this visit.    SURGICAL HISTORY:  Past Surgical History  Procedure Laterality Date  . Total knee arthroplasty  2000    left  . Bunionectomy  2001    right  . Craniotomy  12/17/2011    Procedure: CRANIOTOMY TUMOR EXCISION;  Surgeon: Clydene Fake, MD;  Location: [facility];  Service: Neurosurgery;  Laterality: Left;  LEFT temporal craniotomy with stealth for tumor resection  . Dilation and curettage of uterus  2007    REVIEW OF SYSTEMS:   General: fatigue (-), night sweats (-), fever (-), pain (-) Lymph: palpable nodes (-) HEENT: vision changes (-), mucositis (-), gum bleeding (-), epistaxis (-) Cardiovascular: chest pain (-), palpitations (-) Pulmonary: shortness of breath (-), dyspnea on exertion (-), cough (-), hemoptysis (-) GI:  Early satiety (-), melena (-), dysphagia (-), nausea/vomiting (-), diarrhea (-) GU: dysuria (-), hematuria (-), incontinence (-) Musculoskeletal: joint swelling (-), joint pain (-), back pain (-) Neuro: weakness (-), numbness (-), headache (-), confusion (-) Skin: Rash (-), lesions (-), dryness (-) Psych: depression (-), suicidal/homicidal ideation (-), feeling of hopelessness (-)    PHYSICAL EXAMINATION: Blood pressure 138/85, pulse 108, temperature 98.7 F (37.1 C), temperature source Oral, resp. rate 18, weight 155 lb (70.308 kg). Body mass index is 26.59 kg/(m^2). General:  Patient is a well appearing female in no acute distress HEENT: PERRLA, sclerae anicteric no conjunctival pallor, MMM Neck: supple, no palpable adenopathy Lungs: clear to auscultation bilaterally, no wheezes, rhonchi, or rales Cardiovascular: regular rate rhythm, S1, S2, no murmurs, rubs or gallops Abdomen: Soft, non-tender, non-distended, normoactive bowel sounds, no HSM Extremities: warm and well perfused, no clubbing, cyanosis, or edema Skin: No rashes or lesions Neuro: CN II-XII intact ECOG PERFORMANCE STATUS: 1 - Symptomatic but completely ambulatory   LABORATORY DATA: Lab Results  Component Value Date   WBC 12.0* 04/28/2012   HGB 15.2 04/28/2012   HCT 44.3 04/28/2012   MCV 95.3 04/28/2012   PLT 244 04/28/2012      Chemistry      Component Value Date/Time   NA 139 04/28/2012 0924   NA 138 12/18/2011 0440   K 3.7 04/28/2012 0924   K 4.0 12/18/2011 0440   CL 106 04/28/2012 0924   CL 107 12/18/2011 0440   CO2 24 04/28/2012 0924   CO2 22 12/18/2011 0440   BUN 13.3 04/28/2012 0924   BUN 17 12/18/2011 0440   CREATININE 0.7 04/28/2012 0924   CREATININE 0.50 12/18/2011 0440   CREATININE 0.50 12/12/2011 0000      Component Value Date/Time   CALCIUM 9.9 04/28/2012 0924   CALCIUM 8.7 12/18/2011 0440   ALKPHOS 65 04/28/2012 0924   AST 23 04/28/2012 0924   ALT 35 04/28/2012 0924   BILITOT 1.45* 04/28/2012 0924       RADIOGRAPHIC STUDIES:  No results found.  ASSESSMENT: 67 year old female with  #1 WHO grade 4 glioblastoma multiforme. She is status post partial resection on 12/17/2011. She has completed Temodar and concurrent radiation therapy in December 2013.  #2 on 1/10/ 2014 through 03/18/2012 patient received adjuvant Temodar at 150 mg per meter squared. She has tolerated this dose very well. Because of good tolerance we will plan  on increasing her dose to 200 mg per meter squared per [facility] recommendations. Her cycle 2 will begin on 04/17/2012 through 04/21/2012. She gets her Temodar  from [facility] and we will order this prior to her next cycle. Her total dose of Temodar for cycle 2 will be 325 mg days one through 5.  #3 patient is now being seen with dehydration nausea and vomiting secondary to most likely do Temodar at a higher dose.   PLAN:  #1we will give her IV fluids and Zofran today and tomorrow.  #2 she will be seen back as scheduled in March or sooner if need arises.  All questions were answered. The patient knows to call the clinic with any problems, questions or concerns. We can certainly see the patient much sooner if necessary.  I spent 25 minutes counseling the patient face to face. The total time spent in the appointment was 30 minutes. Marcy Panning, MD Medical/Oncology [facility] [phone] (beeper) [phone] (Office)  04/28/2012, 10:40 AM

## 2012-04-29 ENCOUNTER — Ambulatory Visit (HOSPITAL_BASED_OUTPATIENT_CLINIC_OR_DEPARTMENT_OTHER): Payer: Medicare Other

## 2012-04-29 VITALS — BP 147/81 | HR 108 | Temp 98.7°F

## 2012-04-29 DIAGNOSIS — E860 Dehydration: Secondary | ICD-10-CM

## 2012-04-29 DIAGNOSIS — C712 Malignant neoplasm of temporal lobe: Secondary | ICD-10-CM

## 2012-04-29 DIAGNOSIS — R112 Nausea with vomiting, unspecified: Secondary | ICD-10-CM

## 2012-04-29 MED ORDER — SODIUM CHLORIDE 0.9 % IV SOLN
Freq: Once | INTRAVENOUS | Status: AC
  Administered 2012-04-29: 1000 mL via INTRAVENOUS

## 2012-04-29 MED ORDER — ONDANSETRON 16 MG/50ML IVPB (CHCC)
16.0000 mg | Freq: Once | INTRAVENOUS | Status: AC
  Administered 2012-04-29: 16 mg via INTRAVENOUS

## 2012-04-29 NOTE — Progress Notes (Signed)
1000 L of Saline ordered to be given over 2 hrs per Dr. Welton Flakes today. HL

## 2012-04-29 NOTE — Patient Instructions (Addendum)
[facility] Discharge Instructions   .   If you develop nausea and vomiting that is not controlled by your nausea medication, call the clinic. If it is after clinic hours your family physician or the after hours number for the clinic or go to the Emergency Department.   BELOW ARE SYMPTOMS THAT SHOULD BE REPORTED IMMEDIATELY:  *FEVER GREATER THAN 100.5 F  *CHILLS WITH OR WITHOUT FEVER  NAUSEA AND VOMITING THAT IS NOT CONTROLLED WITH YOUR NAUSEA MEDICATION  *UNUSUAL SHORTNESS OF BREATH  *UNUSUAL BRUISING OR BLEEDING  TENDERNESS IN MOUTH AND THROAT WITH OR WITHOUT PRESENCE OF ULCERS  *URINARY PROBLEMS  *BOWEL PROBLEMS  UNUSUAL RASH Items with * indicate a potential emergency and should be followed up as soon as possible.  One of the nurses will contact you 24 hours after your treatment. Please let the nurse know about any problems that you may have experienced. Feel free to call the clinic you have any questions or concerns. The clinic phone number is [phone].   I have been informed and understand all the instructions given to me. I know to contact the clinic, my physician, or go to the Emergency Department if any problems should occur. I do not have any questions at this time, but understand that I may call the clinic during office hours   should I have any questions or need assistance in obtaining follow up care.    __________________________________________  _____________  __________ Signature of Patient or Authorized Representative            Date                   Time    __________________________________________ Nurse's Signature

## 2012-04-29 NOTE — Progress Notes (Signed)
Pt states she feels much better today, after fluids and zofran yesterday.  Able to keep some solid food down last pm.  Nausea at minimal.  dmr

## 2012-05-02 ENCOUNTER — Ambulatory Visit
Admission: RE | Admit: 2012-05-02 | Discharge: 2012-05-02 | Disposition: A | Discharge status: Home / Self Care | Payer: Medicare Other | Source: Ambulatory Visit | Attending: Radiation Oncology | Admitting: Radiation Oncology

## 2012-05-02 DIAGNOSIS — D496 Neoplasm of unspecified behavior of brain: Secondary | ICD-10-CM

## 2012-05-02 MED ORDER — GADOBENATE DIMEGLUMINE 529 MG/ML IV SOLN
14.0000 mL | Freq: Once | INTRAVENOUS | Status: AC | PRN
  Administered 2012-05-02: 14 mL via INTRAVENOUS

## 2012-05-07 ENCOUNTER — Telehealth: Payer: Self-pay | Admitting: *Deleted

## 2012-05-07 NOTE — Telephone Encounter (Signed)
Son cliff nelson called [phone] Dr.Manning call him and or (pataient)mother, or her husband regarding her MRI done on 05/02/12 Mrs. Storr going to [facility] this coming Monday and is worried about results of MRI, son will be at office till 3:45pm today and would like a call today or tomorrow, ,will forward this message to Dr.'s E-mail and drop copy in [employer] thanked me for the return call back  2:52 PM

## 2012-05-12 ENCOUNTER — Ambulatory Visit: Payer: Medicare Other | Admitting: Radiation Oncology

## 2012-05-13 ENCOUNTER — Encounter: Payer: Self-pay | Admitting: Radiation Oncology

## 2012-05-15 ENCOUNTER — Ambulatory Visit
Admission: RE | Admit: 2012-05-15 | Discharge: 2012-05-15 | Disposition: A | Discharge status: Home / Self Care | Payer: Medicare Other | Source: Ambulatory Visit | Attending: Radiation Oncology | Admitting: Radiation Oncology

## 2012-05-15 ENCOUNTER — Other Ambulatory Visit (HOSPITAL_BASED_OUTPATIENT_CLINIC_OR_DEPARTMENT_OTHER): Payer: Medicare Other | Admitting: Lab

## 2012-05-15 ENCOUNTER — Telehealth: Payer: Self-pay | Admitting: Oncology

## 2012-05-15 ENCOUNTER — Ambulatory Visit (HOSPITAL_BASED_OUTPATIENT_CLINIC_OR_DEPARTMENT_OTHER): Payer: Medicare Other | Admitting: Adult Health

## 2012-05-15 ENCOUNTER — Encounter: Payer: Self-pay | Admitting: Radiation Oncology

## 2012-05-15 ENCOUNTER — Encounter: Payer: Self-pay | Admitting: Adult Health

## 2012-05-15 VITALS — BP 147/83 | HR 85 | Temp 98.6°F | Resp 20 | Wt 160.4 lb

## 2012-05-15 VITALS — BP 147/83 | HR 85 | Temp 98.6°F | Resp 20 | Ht 64.0 in | Wt 160.3 lb

## 2012-05-15 DIAGNOSIS — C712 Malignant neoplasm of temporal lobe: Secondary | ICD-10-CM

## 2012-05-15 DIAGNOSIS — R112 Nausea with vomiting, unspecified: Secondary | ICD-10-CM

## 2012-05-15 DIAGNOSIS — D496 Neoplasm of unspecified behavior of brain: Secondary | ICD-10-CM

## 2012-05-15 DIAGNOSIS — F32A Depression, unspecified: Secondary | ICD-10-CM

## 2012-05-15 DIAGNOSIS — E860 Dehydration: Secondary | ICD-10-CM

## 2012-05-15 LAB — CBC WITH DIFFERENTIAL/PLATELET
Basophils Absolute: 0 10*3/uL (ref 0.0–0.1)
Eosinophils Absolute: 0.1 10*3/uL (ref 0.0–0.5)
HCT: 37.2 % (ref 34.8–46.6)
LYMPH%: 13.2 % — ABNORMAL LOW (ref 14.0–49.7)
MONO#: 0.3 10*3/uL (ref 0.1–0.9)
NEUT#: 2.6 10*3/uL (ref 1.5–6.5)
NEUT%: 75.1 % (ref 38.4–76.8)
Platelets: 200 10*3/uL (ref 145–400)
WBC: 3.5 10*3/uL — ABNORMAL LOW (ref 3.9–10.3)

## 2012-05-15 LAB — COMPREHENSIVE METABOLIC PANEL (CC13)
BUN: 9 mg/dL (ref 7.0–26.0)
CO2: 26 mEq/L (ref 22–29)
Creatinine: 0.7 mg/dL (ref 0.6–1.1)
Glucose: 87 mg/dl (ref 70–99)
Total Bilirubin: 0.68 mg/dL (ref 0.20–1.20)
Total Protein: 6.3 g/dL — ABNORMAL LOW (ref 6.4–8.3)

## 2012-05-15 MED ORDER — TEMOZOLOMIDE 250 MG PO CAPS: 250.0000 mg | ORAL_CAPSULE | Freq: Every day | ORAL | Status: DC

## 2012-05-15 MED ORDER — TEMOZOLOMIDE 20 MG PO CAPS: 20.0000 mg | ORAL_CAPSULE | Freq: Every day | ORAL | Status: DC

## 2012-05-15 NOTE — Telephone Encounter (Signed)
gv pt appt schedule for April and May.

## 2012-05-15 NOTE — Progress Notes (Signed)
OFFICE PROGRESS NOTE  CC  [name],[name], MD [address] [place] [address] 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  PRIOR THERAPY:  #1 patient originally presented with 2-3 week loss of memory with trouble finding words. She had one episode of left parietal headache. Because of this she was seen by her internist an MRI of the brain was done on 12/13/2011 this showed a ring enhancing lesion in the left temporal lobe measuring 2.7 x 3.6 x 2.5 cm with 45 surrounding satellite lesions. She had a complete systemic workup including CT of the chest abdomen and pelvis that was negative. She went on to be seen by neurosurgery Dr. Luiz Ochoa and patient underwent subtotal resection of the dominant lesion on October 14. 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 She went on to see Dr. Fatima Sanger for 2 to who has been treating her she was also seen by [name].  #2 patient was begun on radiation therapy with concurrent Temodar at 75 mg per meter squared. She completed concurrent Temodar and radiation therapy in December 2013.  #3 patient was seen at [facility] as well as [facility]. [facility] has recommended the patient began adjuvant Temodar at 150 milligrams per meter squared she tolerates the first cycle do we could increase the dose to 200 mg per meter squared days one through 5 on a 28 day cycle.  #4 patient began adjuvant Temodar cycle 1 on 03/13/12 through 03/18/12  CURRENT THERAPY:patient will begin cycle #3 of Temodar days 1 through 5 starting on 05/20/12 at 150mg  per meter squared, 270mg   INTERVAL HISTORY: [name] 67 y.o. female returns today for evaluation prior to her Temodar.  She had an appt at [facility] with Dr. Veryl Speak and an MRI that revealed stable disease.  Due to the nausea,  vomiting, and dehydration she decreased the recommended Temodar dose.  Otherwise, Ms. Vanalstine is doing well today.  Denies fevers, chills, nausea, vomiting, or any other problems.    MEDICAL HISTORY: Past Medical History  Diagnosis Date  . PONV (postoperative nausea and vomiting)   . Depression   . GERD (gastroesophageal reflux disease)   . Headache   . HLD (hyperlipidemia)   . Brain tumor 12/18/2011    Left temporal lobe 2.7 x 3.6 x 2.5 cm ring enhancing; numerous small surrounding satellite lesions 12/13/11  . 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 Hiatal hernia   . History of radiation therapy 01/07/2012-02/18/12    left temporal  58GY    ALLERGIES:  is allergic to codeine and compazine.  MEDICATIONS:  Current Outpatient Prescriptions  Medication Sig Dispense Refill  . aspirin EC 81 MG tablet Take 81 mg by mouth at bedtime.      Marland Kitchen buPROPion (WELLBUTRIN XL) 300 MG 24 hr tablet Take 300 mg by mouth every morning.      . Calcium Carbonate-Vitamin D (CALCIUM + D PO) Take 1 tablet by mouth 2 (two) times daily.      . cholecalciferol (VITAMIN D) 1000 UNITS tablet Take 1,000 Units by mouth daily.      . clonazePAM (KLONOPIN) 0.5 MG tablet Take 0.5 mg by mouth at bedtime as needed.      . clotrimazole (MYCELEX) 10 MG troche Take 1 lozenge (10 mg total) by mouth 3 (three) times  daily.  21 lozenge  0  . Melatonin 3 MG CAPS Take 1 capsule (3 mg total) by mouth at bedtime as needed.    0  . Multiple Vitamin (MULTIVITAMIN WITH MINERALS) TABS Take 1 tablet by mouth daily.      . ondansetron (ZOFRAN-ODT) 8 MG disintegrating tablet Take 1 tablet (8 mg total) by mouth every 8 (eight) hours as needed for nausea.  90 tablet  1  . pantoprazole (PROTONIX) 40 MG tablet Take 40 mg by mouth daily.      . promethazine (PHENERGAN) 25 MG suppository Place 1 suppository (25 mg total) rectally every 6 (six) hours as needed for nausea.  20 each  3  . simvastatin (ZOCOR) 40 MG tablet  Take 40 mg by mouth at bedtime.      . tretinoin (RETIN-A) 0.025 % cream Apply 1 application topically daily.      . valACYclovir (VALTREX) 500 MG tablet Take 1 tablet (500 mg total) by mouth daily.  30 tablet  6  . ALPRAZolam (XANAX) 0.25 MG tablet Take 1-2 tablets (0.25-0.5 mg total) by mouth 3 (three) times daily as needed for sleep (15 minutes prior to radiation daily).  30 tablet  0  . dexamethasone (DECADRON) 4 MG tablet Take 1 mg by mouth daily.       Marland Kitchen HYDROcodone-acetaminophen (NORCO/VICODIN) 5-325 MG per tablet Take 1 tablet by mouth every 4 (four) hours as needed for pain.  51 tablet  0  . modafinil (PROVIGIL) 100 MG tablet Take 1 tablet (100 mg total) by mouth daily.  30 tablet  5  . temozolomide (TEMODAR) 20 MG capsule Take 1 capsule (20 mg total) by mouth daily. May take on an empty stomach or at bedtime to decrease nausea & vomiting.  5 capsule  0  . temozolomide (TEMODAR) 250 MG capsule Take 1 capsule (250 mg total) by mouth daily. May take on an empty stomach or at bedtime to decrease nausea & vomiting.  5 capsule  0   No current facility-administered medications for this visit.    SURGICAL HISTORY:  Past Surgical History  Procedure Laterality Date  . Total knee arthroplasty  2000    left  . Bunionectomy  2001    right  . Craniotomy  12/17/2011    Procedure: CRANIOTOMY TUMOR EXCISION;  Surgeon: Otilio Connors, MD;  Location: [facility];  Service: Neurosurgery;  Laterality: Left;  LEFT temporal craniotomy with stealth for tumor resection  . Dilation and curettage of uterus  2007    REVIEW OF SYSTEMS:   General: fatigue (-), night sweats (-), fever (-), pain (-) Lymph: palpable nodes (-) HEENT: vision changes (-), mucositis (-), gum bleeding (-), epistaxis (-) Cardiovascular: chest pain (-), palpitations (-) Pulmonary: shortness of breath (-), dyspnea on exertion (-), cough (-), hemoptysis (-) GI:  Early satiety (-), melena (-), dysphagia (-), nausea/vomiting (-),  diarrhea (-) GU: dysuria (-), hematuria (-), incontinence (-) Musculoskeletal: joint swelling (-), joint pain (-), back pain (-) Neuro: weakness (-), numbness (-), headache (-), confusion (-) Skin: Rash (-), lesions (-), dryness (-) Psych: depression (-), suicidal/homicidal ideation (-), feeling of hopelessness (-)    PHYSICAL EXAMINATION: Blood pressure 147/83, pulse 85, temperature 98.6 F (37 C), temperature source Oral, resp. rate 20, height 5\' 4"  (1.626 m), weight 160 lb 4.8 oz (72.712 kg). Body mass index is 27.5 kg/(m^2). General: Patient is a well appearing female in no acute distress HEENT: PERRLA, sclerae anicteric no conjunctival pallor, MMM  Neck: supple, no palpable adenopathy Lungs: clear to auscultation bilaterally, no wheezes, rhonchi, or rales Cardiovascular: regular rate rhythm, S1, S2, no murmurs, rubs or gallops Abdomen: Soft, non-tender, non-distended, normoactive bowel sounds, no HSM Extremities: warm and well perfused, no clubbing, cyanosis, or edema Skin: No rashes or lesions Neuro: CN II-XII intact ECOG PERFORMANCE STATUS: 1 - Symptomatic but completely ambulatory   LABORATORY DATA: Lab Results  Component Value Date   WBC 3.5* 05/15/2012   HGB 12.8 05/15/2012   HCT 37.2 05/15/2012   MCV 94.1 05/15/2012   PLT 200 05/15/2012      Chemistry      Component Value Date/Time   NA 143 05/15/2012 1355   NA 138 12/18/2011 0440   K 4.3 05/15/2012 1355   K 4.0 12/18/2011 0440   CL 109* 05/15/2012 1355   CL 107 12/18/2011 0440   CO2 26 05/15/2012 1355   CO2 22 12/18/2011 0440   BUN 9.0 05/15/2012 1355   BUN 17 12/18/2011 0440   CREATININE 0.7 05/15/2012 1355   CREATININE 0.50 12/18/2011 0440   CREATININE 0.50 12/12/2011 0000      Component Value Date/Time   CALCIUM 9.6 05/15/2012 1355   CALCIUM 8.7 12/18/2011 0440   ALKPHOS 66 05/15/2012 1355   AST 29 05/15/2012 1355   ALT 38 05/15/2012 1355   BILITOT 0.68 05/15/2012 1355       RADIOGRAPHIC STUDIES:  No  results found.  ASSESSMENT: 67 year old female with  #1 WHO grade 4 glioblastoma multiforme. She is status post partial resection on 12/17/2011. She has completed Temodar and concurrent radiation therapy in December 2013.  #2 on 1/10/ 2014 through 03/18/2012 patient received adjuvant Temodar at 150 mg per meter squared. She has tolerated this dose very well. Because of good tolerance we will plan on increasing her dose to 200 mg per meter squared per [facility] recommendations. Her cycle 2 will begin on 04/17/2012 through 04/21/2012. She gets her Temodar from [facility] and we will order this prior to her next cycle. Her total dose of Temodar for cycle 2 will be 325 mg days one through 5.  #3 patient is now being seen with dehydration nausea and vomiting secondary to most likely do Temodar at a higher dose.   PLAN:  #1 Doing well.  I will prescribe the temodar as recommended.  I have given the scripts to the nurse to fax.    #2  We will see her back in 4 weeks for f/u.  All questions were answered. The patient knows to call the clinic with any problems, questions or concerns. We can certainly see the patient much sooner if necessary.  I spent 25 minutes counseling the patient face to face. The total time spent in the appointment was 30 minutes.  Suzan Garibaldi Sheppard Coil, [place] Phone: [phone]    05/15/2012, 4:01 PM

## 2012-05-15 NOTE — Patient Instructions (Signed)
Doing well. Proceed with Temodar on 05/20/12.  We will fax it in today.  Please take nausea medication before the Temodar.  Please call us if you have any questions or concerns.

## 2012-05-15 NOTE — Progress Notes (Signed)
Patient here followup  S/p left temporal brain rad txs 01/07/12-02/18/12, alert,oriented x3, eating well, no c/o head ache, pain,nausea queeziness at times, "If I just stay still for a while, it will go away" patiens weight and vitals taken upstairs med onc, along with labs,  Will restart Temodar this coming Tuesday, still has droopy left eye,  Slight alopecia, last seen at [facility] 05/12/12  3:48 PM

## 2012-05-16 ENCOUNTER — Encounter: Payer: Self-pay | Admitting: Radiation Oncology

## 2012-05-16 NOTE — Progress Notes (Signed)
Radiation Oncology         ([phone]) [phone] ________________________________  Name: Kelly Maxwell MRN: [id number]  Date: 05/15/2012  DOB: [birth year]  Follow-Up Visit Note  CC: [name], MD  [name], MD  Diagnosis:   67 year old woman s/p partial resection of a multifocal left temporal grade IV gliosarcoma s/p radiotherapy to 60 Gy - 01/07/2012-02/18/2012  Interval Since Last Radiation:  3  months  Narrative:  The patient returns today for routine follow-up.  She is essentially without complaint except for some drooping of the left eyelid. She notes some left periorbital swelling the morning and variable left upper lid weakness. She denies diplopia. She also notes that certain house cleaning solutions now had a foul or irritating odor.                              ALLERGIES:  is allergic to codeine and compazine.  Meds: Current Outpatient Prescriptions  Medication Sig Dispense Refill  . ALPRAZolam (XANAX) 0.25 MG tablet Take 1-2 tablets (0.25-0.5 mg total) by mouth 3 (three) times daily as needed for sleep (15 minutes prior to radiation daily).  30 tablet  0  . aspirin EC 81 MG tablet Take 81 mg by mouth at bedtime.      Marland Kitchen buPROPion (WELLBUTRIN XL) 300 MG 24 hr tablet Take 300 mg by mouth every morning.      . Calcium Carbonate-Vitamin D (CALCIUM + D PO) Take 1 tablet by mouth 2 (two) times daily.      . cholecalciferol (VITAMIN D) 1000 UNITS tablet Take 1,000 Units by mouth daily.      . clonazePAM (KLONOPIN) 0.5 MG tablet Take 0.5 mg by mouth at bedtime as needed.      . clotrimazole (MYCELEX) 10 MG troche Take 1 lozenge (10 mg total) by mouth 3 (three) times daily.  21 lozenge  0  . dexamethasone (DECADRON) 4 MG tablet Take 1 mg by mouth daily.       Marland Kitchen HYDROcodone-acetaminophen (NORCO/VICODIN) 5-325 MG per tablet Take 1 tablet by mouth every 4 (four) hours as needed for pain.  51 tablet  0  . Melatonin 3 MG CAPS Take 1 capsule (3 mg total) by mouth at bedtime as needed.    0    . modafinil (PROVIGIL) 100 MG tablet Take 1 tablet (100 mg total) by mouth daily.  30 tablet  5  . Multiple Vitamin (MULTIVITAMIN WITH MINERALS) TABS Take 1 tablet by mouth daily.      . ondansetron (ZOFRAN-ODT) 8 MG disintegrating tablet Take 1 tablet (8 mg total) by mouth every 8 (eight) hours as needed for nausea.  90 tablet  1  . pantoprazole (PROTONIX) 40 MG tablet Take 40 mg by mouth daily.      . promethazine (PHENERGAN) 25 MG suppository Place 1 suppository (25 mg total) rectally every 6 (six) hours as needed for nausea.  20 each  3  . simvastatin (ZOCOR) 40 MG tablet Take 40 mg by mouth at bedtime.      . temozolomide (TEMODAR) 20 MG capsule Take 1 capsule (20 mg total) by mouth daily. May take on an empty stomach or at bedtime to decrease nausea & vomiting.  5 capsule  0  . temozolomide (TEMODAR) 250 MG capsule Take 1 capsule (250 mg total) by mouth daily. May take on an empty stomach or at bedtime to decrease nausea & vomiting.  5 capsule  0  .  tretinoin (RETIN-A) 0.025 % cream Apply 1 application topically daily.      . valACYclovir (VALTREX) 500 MG tablet Take 1 tablet (500 mg total) by mouth daily.  30 tablet  6   No current facility-administered medications for this encounter.    Physical Findings: The patient is in no acute distress. Patient is alert and oriented.  weight is 160 lb 6.4 oz (72.757 kg). Her oral temperature is 98.6 F (37 C). Her blood pressure is 147/83 and her pulse is 85. Her respiration is 20 and oxygen saturation is 98%. Her head is normocephalic and atraumatic..The patient does have some epilation correlating with her radiation ports. She does have a noticeable left eyelid droop. Her extraocular muscles are intact. Motor strength is intact throughout. Light touch is intact. The patient's speech is fluent and articulate. Her mood and affect are appropriate.   No significant changes.  Lab Findings: Lab Results  Component Value Date   WBC 3.5* 05/15/2012    HGB 12.8 05/15/2012   HCT 37.2 05/15/2012   MCV 94.1 05/15/2012   PLT 200 05/15/2012    @LASTCHEM @  Radiographic Findings: Mr Laqueta Jean AV Contrast  05/02/2012  *RADIOLOGY REPORT*  Clinical Data: S R S restaging, 4 1/2 month post treatment.  MRI HEAD WITHOUT AND WITH CONTRAST  Technique:  Multiplanar, multiecho pulse sequences of the brain and surrounding structures were obtained according to standard protocol without and with intravenous contrast  Contrast: 14mL MULTIHANCE GADOBENATE DIMEGLUMINE 529 MG/ML IV SOLN  Comparison: Most recent 03/11/2012.  Findings: The patient is status post left temporal craniotomy for tumor debulking.  Residual high-grade glioma was seen to infiltrate the adjacent left frontal lobe and insular cortex as well as the basal ganglia and white matter pathways.  There has been improvement or resolution of all of the previous areas of enhancement. These areas are indicated with single arrows; please note some arrows on today's exam point to regions of normal appearing brain where enhancement used to be present.  -Decreased curvilinear/nodular enhancement left inferior frontal gyrus. - Left caudate lesion no longer enhances. - Subcortical left frontal white matter lesion no longer enhances. - Small more anterior left inferior frontal gyrus lesion no longer enhances. - 5 mm more posterior, inferior frontal gyrus lesion smaller,  (see image 36 series 11).  Mild atrophy.  Mild chronic microvascular ischemic change.  Slight vasogenic edema left insular cortex/subcortical white matter. Chronic hemorrhage left temporal lobe, postoperative in nature. Major intracranial vascular structures patent.  Chronic sinus disease.  Negative orbits. Postoperative dural enhancement.  IMPRESSION: Improvement or resolution of multiple previous areas of enhancement adjacent to the site of subtotal removal left temporal lobe high- grade glioma.   Original Report Authenticated By: Davonna Belling, M.D.     Impression:  The patient currently exhibits a partial response to radiation and Temodar. She still maintains a very good performance status with minor complaints. The etiology of her left eye droop is not clear, but could reflect postoperative lymphatic alterations.   Plan:  Patient will undergo a brain MRI in 2 months and we'll see her for followup thereafter. She is also following closely with [facility] and Dr. Welton Flakes   _____________________________________  Artist Pais. Kathrynn Running, M.D.

## 2012-06-05 ENCOUNTER — Telehealth: Payer: Self-pay | Admitting: *Deleted

## 2012-06-05 NOTE — Telephone Encounter (Signed)
CALLED PATIENT TO ALTER FU VISIT FOR 07-10-12, DUE TO DR. BEING IN THE OR, RESCHEDULED FOR 07-10-12 AT 2:15 PM, LVM FOR A RETURN CALL

## 2012-06-11 ENCOUNTER — Other Ambulatory Visit: Payer: Self-pay | Admitting: Emergency Medicine

## 2012-06-12 ENCOUNTER — Ambulatory Visit (HOSPITAL_BASED_OUTPATIENT_CLINIC_OR_DEPARTMENT_OTHER): Payer: Medicare Other | Admitting: Oncology

## 2012-06-12 ENCOUNTER — Encounter: Payer: Self-pay | Admitting: Oncology

## 2012-06-12 ENCOUNTER — Telehealth: Payer: Self-pay | Admitting: *Deleted

## 2012-06-12 ENCOUNTER — Other Ambulatory Visit (HOSPITAL_BASED_OUTPATIENT_CLINIC_OR_DEPARTMENT_OTHER): Payer: Medicare Other | Admitting: Lab

## 2012-06-12 VITALS — BP 128/77 | HR 82 | Temp 98.3°F | Resp 20 | Ht 64.0 in | Wt 156.2 lb

## 2012-06-12 DIAGNOSIS — D496 Neoplasm of unspecified behavior of brain: Secondary | ICD-10-CM

## 2012-06-12 DIAGNOSIS — C719 Malignant neoplasm of brain, unspecified: Secondary | ICD-10-CM

## 2012-06-12 LAB — COMPREHENSIVE METABOLIC PANEL (CC13)
Albumin: 3.5 g/dL (ref 3.5–5.0)
CO2: 27 mEq/L (ref 22–29)
Calcium: 10.1 mg/dL (ref 8.4–10.4)
Glucose: 110 mg/dl — ABNORMAL HIGH (ref 70–99)
Sodium: 141 mEq/L (ref 136–145)
Total Bilirubin: 0.9 mg/dL (ref 0.20–1.20)
Total Protein: 6.4 g/dL (ref 6.4–8.3)

## 2012-06-12 LAB — CBC WITH DIFFERENTIAL/PLATELET
Eosinophils Absolute: 0.2 10*3/uL (ref 0.0–0.5)
HCT: 36.9 % (ref 34.8–46.6)
LYMPH%: 12.3 % — ABNORMAL LOW (ref 14.0–49.7)
MONO#: 0.3 10*3/uL (ref 0.1–0.9)
NEUT#: 3.1 10*3/uL (ref 1.5–6.5)
Platelets: 268 10*3/uL (ref 145–400)
RBC: 3.96 10*6/uL (ref 3.70–5.45)
WBC: 4.1 10*3/uL (ref 3.9–10.3)

## 2012-06-12 MED ORDER — TEMOZOLOMIDE 20 MG PO CAPS: 20.0000 mg | ORAL_CAPSULE | Freq: Every day | ORAL | Status: DC

## 2012-06-12 MED ORDER — TEMOZOLOMIDE 250 MG PO CAPS: 250.0000 mg | ORAL_CAPSULE | Freq: Every day | ORAL | Status: DC

## 2012-06-12 NOTE — Patient Instructions (Addendum)
Proceed with temodar 270 mg daily days 1 - 5

## 2012-06-12 NOTE — Progress Notes (Signed)
OFFICE PROGRESS NOTE  CC  [name],[name], MD [address] [place] [address] Dr. 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  PRIOR THERAPY:  #1 patient originally presented with 2-3 week loss of memory with trouble finding words. She had one episode of left parietal headache. Because of this she was seen by her internist an MRI of the brain was done on 12/13/2011 this showed a ring enhancing lesion in the left temporal lobe measuring 2.7 x 3.6 x 2.5 cm with 45 surrounding satellite lesions. She had a complete systemic workup including CT of the chest abdomen and pelvis that was negative. She went on to be seen by neurosurgery Dr. Phoebe Perch and patient underwent subtotal resection of the dominant lesion on October 14. 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 She went on to see Dr. Kennedy Bucker for 2 to who has been treating her she was also seen by Dr. Margaretmary Dys.  #2 patient was begun on radiation therapy with concurrent Temodar at 75 mg per meter squared. She completed concurrent Temodar and radiation therapy in December 2013.  #3 patient was seen at [facility] as well as [facility]. [facility] has recommended the patient began adjuvant Temodar at 150 milligrams per meter squared she tolerates the first cycle do we could increase the dose to 200 mg per meter squared days one through 5 on a 28 day cycle.  #4 patient began adjuvant Temodar cycle 1 on 03/13/12 through 03/18/12 initially at 150 mg per meter squared. She tolerated this well. Then the recommendation was to increase the dose initially at a higher dose of 200 mg per meter squared. However she could not tolerate this. So the third cycle was reduced to 150 mg per meter squared days one through 5. CURRENT THERAPY:patient will begin cycle #4 of Temodar days 1  through 5 starting on 06/12/12 at 150mg  per meter squared, 270mg   INTERVAL HISTORY: Kelly Maxwell 67 y.o. female returns today for evaluation prior to her Temodar.  She had an appt at [facility] with Dr. Lucrezia Europe and an MRI that revealed stable disease.  Due to the nausea, vomiting, and dehydration she decreased the recommended Temodar dose.  Otherwise, [name] is doing well today.  Denies fevers, chills, nausea, vomiting, or any other problems.    MEDICAL HISTORY: Past Medical History  Diagnosis Date  . PONV (postoperative nausea and vomiting)   . Depression   . GERD (gastroesophageal reflux disease)   . Headache   . HLD (hyperlipidemia)   . Brain tumor 12/18/2011    Left temporal lobe 2.7 x 3.6 x 2.5 cm ring enhancing; numerous small surrounding satellite lesions 12/13/11  . 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 Hiatal hernia   . History of radiation therapy 01/07/2012-02/18/12    left temporal  58GY    ALLERGIES:  is allergic to codeine and compazine.  MEDICATIONS:  Current Outpatient Prescriptions  Medication Sig Dispense Refill  . ALPRAZolam (XANAX) 0.25 MG tablet Take 1-2 tablets (0.25-0.5 mg total) by mouth 3 (three) times daily as needed for sleep (15 minutes prior to radiation daily).  30 tablet  0  . aspirin EC 81 MG tablet Take 81 mg by mouth at bedtime.      Marland Kitchen buPROPion (WELLBUTRIN XL) 300 MG 24 hr tablet Take 300 mg by  mouth every morning.      . Calcium Carbonate-Vitamin D (CALCIUM + D PO) Take 1 tablet by mouth 2 (two) times daily.      . cholecalciferol (VITAMIN D) 1000 UNITS tablet Take 1,000 Units by mouth daily.      . clonazePAM (KLONOPIN) 0.5 MG tablet Take 0.5 mg by mouth at bedtime as needed.      . clotrimazole (MYCELEX) 10 MG troche Take 1 lozenge (10 mg total) by mouth 3 (three) times daily.  21 lozenge  0  . dexamethasone (DECADRON) 4 MG tablet Take 1 mg by mouth daily.       Marland Kitchen HYDROcodone-acetaminophen (NORCO/VICODIN) 5-325 MG  per tablet Take 1 tablet by mouth every 4 (four) hours as needed for pain.  51 tablet  0  . Melatonin 3 MG CAPS Take 1 capsule (3 mg total) by mouth at bedtime as needed.    0  . modafinil (PROVIGIL) 100 MG tablet Take 1 tablet (100 mg total) by mouth daily.  30 tablet  5  . Multiple Vitamin (MULTIVITAMIN WITH MINERALS) TABS Take 1 tablet by mouth daily.      . ondansetron (ZOFRAN-ODT) 8 MG disintegrating tablet Take 1 tablet (8 mg total) by mouth every 8 (eight) hours as needed for nausea.  90 tablet  1  . pantoprazole (PROTONIX) 40 MG tablet Take 40 mg by mouth daily.      . promethazine (PHENERGAN) 25 MG suppository Place 1 suppository (25 mg total) rectally every 6 (six) hours as needed for nausea.  20 each  3  . simvastatin (ZOCOR) 40 MG tablet Take 40 mg by mouth at bedtime.      . temozolomide (TEMODAR) 20 MG capsule Take 1 capsule (20 mg total) by mouth daily. May take on an empty stomach or at bedtime to decrease nausea & vomiting.  5 capsule  0  . temozolomide (TEMODAR) 250 MG capsule Take 1 capsule (250 mg total) by mouth daily. May take on an empty stomach or at bedtime to decrease nausea & vomiting.  5 capsule  0  . tretinoin (RETIN-A) 0.025 % cream Apply 1 application topically daily.      . valACYclovir (VALTREX) 500 MG tablet Take 1 tablet (500 mg total) by mouth daily.  30 tablet  6   No current facility-administered medications for this visit.    SURGICAL HISTORY:  Past Surgical History  Procedure Laterality Date  . Total knee arthroplasty  2000    left  . Bunionectomy  2001    right  . Craniotomy  12/17/2011    Procedure: CRANIOTOMY TUMOR EXCISION;  Surgeon: Otilio Connors, MD;  Location: [facility];  Service: Neurosurgery;  Laterality: Left;  LEFT temporal craniotomy with stealth for tumor resection  . Dilation and curettage of uterus  2007    REVIEW OF SYSTEMS:   General: fatigue (-), night sweats (-), fever (-), pain (-) Lymph: palpable nodes (-) HEENT: vision  changes (-), mucositis (-), gum bleeding (-), epistaxis (-) Cardiovascular: chest pain (-), palpitations (-) Pulmonary: shortness of breath (-), dyspnea on exertion (-), cough (-), hemoptysis (-) GI:  Early satiety (-), melena (-), dysphagia (-), nausea/vomiting (-), diarrhea (-) GU: dysuria (-), hematuria (-), incontinence (-) Musculoskeletal: joint swelling (-), joint pain (-), back pain (-) Neuro: weakness (-), numbness (-), headache (-), confusion (-) Skin: Rash (-), lesions (-), dryness (-) Psych: depression (-), suicidal/homicidal ideation (-), feeling of hopelessness (-)    PHYSICAL EXAMINATION: Blood pressure  128/77, pulse 82, temperature 98.3 F (36.8 C), temperature source Oral, resp. rate 20, height 5\' 4"  (1.626 m), weight 156 lb 3.2 oz (70.852 kg). Body mass index is 26.8 kg/(m^2). General: Patient is a well appearing female in no acute distress HEENT: PERRLA, sclerae anicteric no conjunctival pallor, MMM Neck: supple, no palpable adenopathy Lungs: clear to auscultation bilaterally, no wheezes, rhonchi, or rales Cardiovascular: regular rate rhythm, S1, S2, no murmurs, rubs or gallops Abdomen: Soft, non-tender, non-distended, normoactive bowel sounds, no HSM Extremities: warm and well perfused, no clubbing, cyanosis, or edema Skin: No rashes or lesions Neuro: CN II-XII intact ECOG PERFORMANCE STATUS: 1 - Symptomatic but completely ambulatory   LABORATORY DATA: Lab Results  Component Value Date   WBC 4.1 06/12/2012   HGB 12.5 06/12/2012   HCT 36.9 06/12/2012   MCV 93.2 06/12/2012   PLT 268 06/12/2012      Chemistry      Component Value Date/Time   NA 141 06/12/2012 1523   NA 138 12/18/2011 0440   K 4.2 06/12/2012 1523   K 4.0 12/18/2011 0440   CL 106 06/12/2012 1523   CL 107 12/18/2011 0440   CO2 27 06/12/2012 1523   CO2 22 12/18/2011 0440   BUN 11.6 06/12/2012 1523   BUN 17 12/18/2011 0440   CREATININE 0.7 06/12/2012 1523   CREATININE 0.50 12/18/2011 0440    CREATININE 0.50 12/12/2011 0000      Component Value Date/Time   CALCIUM 10.1 06/12/2012 1523   CALCIUM 8.7 12/18/2011 0440   ALKPHOS 75 06/12/2012 1523   AST 20 06/12/2012 1523   ALT 24 06/12/2012 1523   BILITOT 0.90 06/12/2012 1523       RADIOGRAPHIC STUDIES:  No results found.  ASSESSMENT: 67 year old female with  #1 WHO grade 4 glioblastoma multiforme. She is status post partial resection on 12/17/2011. She has completed Temodar and concurrent radiation therapy in December 2013.  #2 on 1/10/ 2014 through 03/18/2012 patient received adjuvant Temodar at 150 mg per meter squared. She has tolerated this dose very well. Because of good tolerance we will plan on increasing her dose to 200 mg per meter squared per [facility] recommendations. Her cycle 2 will begin on 04/17/2012 through 04/21/2012. She gets her Temodar from [facility] and we will order this prior to her next cycle. Her total dose of Temodar for cycle 2 will be 325 mg days one through 5.  #3 patient dose of Temodar was reduced 150 mg per meter squared for a total of 270 mg days one through 5. Due to increased toxicity with the higher dose of 200 mg per meter squared as per [facility] recommendations. Patient is now here for cycle 4 days one through 5 of Temodar.  PLAN:   #1 Doing well.  I will prescribe the temodar as recommended.  I have given the scripts to the nurse to fax.    #2  We will see her back in 4 weeks for f/u.  All questions were answered. The patient knows to call the clinic with any problems, questions or concerns. We can certainly see the patient much sooner if necessary.  I spent 25 minutes counseling the patient face to face. The total time spent in the appointment was 30 minutes.  Marcy Panning, MD Medical/Oncology [facility] [phone] (beeper) [phone] (Office)  06/12/2012, 7:34 PM

## 2012-06-12 NOTE — Telephone Encounter (Signed)
Left message for pt to return the call due to Mardella Layman out sick.

## 2012-06-13 ENCOUNTER — Other Ambulatory Visit: Payer: Self-pay | Admitting: Emergency Medicine

## 2012-06-13 DIAGNOSIS — D496 Neoplasm of unspecified behavior of brain: Secondary | ICD-10-CM

## 2012-06-13 MED ORDER — TEMOZOLOMIDE 250 MG PO CAPS: 250.0000 mg | ORAL_CAPSULE | Freq: Every day | ORAL | Status: DC

## 2012-06-13 MED ORDER — TEMOZOLOMIDE 20 MG PO CAPS: 20.0000 mg | ORAL_CAPSULE | Freq: Every day | ORAL | Status: DC

## 2012-06-19 ENCOUNTER — Other Ambulatory Visit: Payer: Self-pay | Admitting: Emergency Medicine

## 2012-06-19 DIAGNOSIS — D496 Neoplasm of unspecified behavior of brain: Secondary | ICD-10-CM

## 2012-06-19 MED ORDER — ONDANSETRON 8 MG PO TBDP: 8.0000 mg | ORAL_TABLET | Freq: Three times a day (TID) | ORAL | Status: DC | PRN

## 2012-06-23 ENCOUNTER — Other Ambulatory Visit: Payer: Self-pay | Admitting: *Deleted

## 2012-06-23 DIAGNOSIS — D496 Neoplasm of unspecified behavior of brain: Secondary | ICD-10-CM

## 2012-06-23 MED ORDER — ONDANSETRON 8 MG PO TBDP: 8.0000 mg | ORAL_TABLET | Freq: Three times a day (TID) | ORAL | Status: DC | PRN

## 2012-07-03 ENCOUNTER — Encounter: Payer: Self-pay | Admitting: Radiation Oncology

## 2012-07-03 ENCOUNTER — Ambulatory Visit
Admission: RE | Admit: 2012-07-03 | Discharge: 2012-07-03 | Disposition: A | Discharge status: Home / Self Care | Payer: Medicare Other | Source: Ambulatory Visit | Attending: Radiation Oncology | Admitting: Radiation Oncology

## 2012-07-03 DIAGNOSIS — D496 Neoplasm of unspecified behavior of brain: Secondary | ICD-10-CM

## 2012-07-03 MED ORDER — GADOBENATE DIMEGLUMINE 529 MG/ML IV SOLN
14.0000 mL | Freq: Once | INTRAVENOUS | Status: AC | PRN
  Administered 2012-07-03: 14 mL via INTRAVENOUS

## 2012-07-09 ENCOUNTER — Other Ambulatory Visit: Payer: Self-pay | Admitting: Medical Oncology

## 2012-07-09 DIAGNOSIS — D496 Neoplasm of unspecified behavior of brain: Secondary | ICD-10-CM

## 2012-07-10 ENCOUNTER — Ambulatory Visit: Payer: Medicare Other | Admitting: Radiation Oncology

## 2012-07-10 ENCOUNTER — Telehealth: Payer: Self-pay | Admitting: Medical Oncology

## 2012-07-10 ENCOUNTER — Telehealth: Payer: Self-pay | Admitting: Oncology

## 2012-07-10 ENCOUNTER — Ambulatory Visit (HOSPITAL_BASED_OUTPATIENT_CLINIC_OR_DEPARTMENT_OTHER): Payer: Medicare Other | Admitting: Oncology

## 2012-07-10 ENCOUNTER — Other Ambulatory Visit (HOSPITAL_BASED_OUTPATIENT_CLINIC_OR_DEPARTMENT_OTHER): Payer: Medicare Other | Admitting: Lab

## 2012-07-10 ENCOUNTER — Ambulatory Visit
Admission: RE | Admit: 2012-07-10 | Discharge: 2012-07-10 | Disposition: A | Discharge status: Home / Self Care | Payer: Medicare Other | Source: Ambulatory Visit | Attending: Radiation Oncology | Admitting: Radiation Oncology

## 2012-07-10 ENCOUNTER — Encounter: Payer: Self-pay | Admitting: Radiation Oncology

## 2012-07-10 VITALS — BP 137/64 | HR 80 | Temp 97.9°F | Resp 20 | Ht 64.0 in | Wt 151.3 lb

## 2012-07-10 VITALS — BP 127/82 | HR 108 | Temp 98.7°F | Ht 65.0 in | Wt 152.3 lb

## 2012-07-10 DIAGNOSIS — D496 Neoplasm of unspecified behavior of brain: Secondary | ICD-10-CM

## 2012-07-10 DIAGNOSIS — C712 Malignant neoplasm of temporal lobe: Secondary | ICD-10-CM

## 2012-07-10 LAB — CBC WITH DIFFERENTIAL/PLATELET
Eosinophils Absolute: 0.1 10*3/uL (ref 0.0–0.5)
MONO#: 0.2 10*3/uL (ref 0.1–0.9)
NEUT#: 2.9 10*3/uL (ref 1.5–6.5)
Platelets: 294 10*3/uL (ref 145–400)
RBC: 4.15 10*6/uL (ref 3.70–5.45)
RDW: 14.1 % (ref 11.2–14.5)
WBC: 3.8 10*3/uL — ABNORMAL LOW (ref 3.9–10.3)
lymph#: 0.6 10*3/uL — ABNORMAL LOW (ref 0.9–3.3)

## 2012-07-10 LAB — COMPREHENSIVE METABOLIC PANEL (CC13)
ALT: 17 U/L (ref 0–55)
Albumin: 3.3 g/dL — ABNORMAL LOW (ref 3.5–5.0)
CO2: 26 mEq/L (ref 22–29)
Glucose: 77 mg/dl (ref 70–99)
Potassium: 4.2 mEq/L (ref 3.5–5.1)
Sodium: 144 mEq/L (ref 136–145)
Total Protein: 6.2 g/dL — ABNORMAL LOW (ref 6.4–8.3)

## 2012-07-10 MED ORDER — TEMOZOLOMIDE 250 MG PO CAPS: 250.0000 mg | ORAL_CAPSULE | Freq: Every day | ORAL | Status: DC

## 2012-07-10 MED ORDER — TEMOZOLOMIDE 20 MG PO CAPS: 20.0000 mg | ORAL_CAPSULE | Freq: Every day | ORAL | Status: DC

## 2012-07-10 NOTE — Progress Notes (Signed)
Radiation Oncology         [phone] ________________________________  Name: Kelly Maxwell MRN: [id number]  Date: 07/10/2012  DOB: 13-Dec-1945  Follow-Up Visit Note  CC: Kelly Housekeeper, MD  Kelly Housekeeper, MD  Diagnosis:   67 year old woman s/p partial resection of a multifocal left temporal grade IV gliosarcoma s/p radiotherapy to 60 Gy - 01/07/2012-02/18/2012   Interval Since Last Radiation:  5  months  Narrative:  The patient returns today for routine follow-up.  She has completed 4 cycles of 5 day Temodar. She denies new or progressive neurologic symptoms. She continues to struggle with chemotherapy induced nausea.  She is asking about various supplements as well. More recent MRI 07/03/2012 was  reviewed today and this shows further improvement.                               ALLERGIES:  is allergic to codeine and compazine.  Meds: Current Outpatient Prescriptions  Medication Sig Dispense Refill  . ALPRAZolam (XANAX) 0.25 MG tablet Take 1-2 tablets (0.25-0.5 mg total) by mouth 3 (three) times daily as needed for sleep (15 minutes prior to radiation daily).  30 tablet  0  . aspirin EC 81 MG tablet Take 81 mg by mouth at bedtime.      Marland Kitchen buPROPion (WELLBUTRIN XL) 300 MG 24 hr tablet Take 300 mg by mouth every morning.      . Calcium Carbonate-Vitamin D (CALCIUM + D PO) Take 1 tablet by mouth 2 (two) times daily.      . cholecalciferol (VITAMIN D) 1000 UNITS tablet Take 1,000 Units by mouth daily.      . clonazePAM (KLONOPIN) 0.5 MG tablet Take 0.5 mg by mouth at bedtime as needed.      . clotrimazole (MYCELEX) 10 MG troche Take 1 lozenge (10 mg total) by mouth 3 (three) times daily.  21 lozenge  0  . dexamethasone (DECADRON) 4 MG tablet Take 1 mg by mouth daily.       Marland Kitchen estradiol (ESTRACE) 0.5 MG tablet       . HYDROcodone-acetaminophen (NORCO/VICODIN) 5-325 MG per tablet Take 1 tablet by mouth every 4 (four) hours as needed for pain.  51 tablet  0  . medroxyPROGESTERone (PROVERA) 2.5  MG tablet       . Melatonin 3 MG CAPS Take 1 capsule (3 mg total) by mouth at bedtime as needed.    0  . modafinil (PROVIGIL) 100 MG tablet Take 1 tablet (100 mg total) by mouth daily.  30 tablet  5  . Multiple Vitamin (MULTIVITAMIN WITH MINERALS) TABS Take 1 tablet by mouth daily.      . ondansetron (ZOFRAN-ODT) 8 MG disintegrating tablet Take 1 tablet (8 mg total) by mouth every 8 (eight) hours as needed for nausea.  30 tablet  12  . pantoprazole (PROTONIX) 40 MG tablet Take 40 mg by mouth daily.      . promethazine (PHENERGAN) 25 MG suppository Place 1 suppository (25 mg total) rectally every 6 (six) hours as needed for nausea.  20 each  3  . SANCUSO 3.1 MG/24HR       . simvastatin (ZOCOR) 40 MG tablet Take 40 mg by mouth at bedtime.      . temozolomide (TEMODAR) 20 MG capsule Take 1 capsule (20 mg total) by mouth daily. May take on an empty stomach or at bedtime to decrease nausea & vomiting.  5 capsule  0  .  temozolomide (TEMODAR) 250 MG capsule Take 1 capsule (250 mg total) by mouth daily. May take on an empty stomach or at bedtime to decrease nausea & vomiting.  5 capsule  0  . tretinoin (RETIN-A) 0.025 % cream Apply 1 application topically daily.      . valACYclovir (VALTREX) 500 MG tablet Take 1 tablet (500 mg total) by mouth daily.  30 tablet  6   No current facility-administered medications for this encounter.    Physical Findings: The patient is in no acute distress. Patient is alert and oriented.  vitals were not taken for this visit.  Per NeuroOnc:Neurologic Exam Mental Status: Patient oriented x3, affect is appropriate. Able for perform the cognitive evaluation without difficulty Speech: Fluent with normal language production and no dysarthria. There is no evidence of expressive or receptive aphasia. Repetition is normal.  Cranial Nerves II: Visual fields are full.  III/IV/VI: Gaze, tracking normal with smooth pursuits, no saccades. Mild left ptosis. PERRLA  V/VII: Facial  sensation is intact. Muscles of mastication normal.  VIII: Hearing is grossly normal.  IX: Gag reflex not tested. Voice without hoarseness.  X: Palate elevates symmetrically.  XI: Sternocleidomastoid and trapezius normal.  XII: Tongue midline without fasciculations.  Motor: strength symmetric 5/5, normal muscle mass and tone in all extremities and no pronator drift  Sensory: intact to light touch, pin prick, vibration and proprioception  Reflexes: 2+ and symmetric bilaterally for arms and legs  Coordination: intact finger to nose, heel to shin and rapid alternating movements  Gait: normal and tandem gait is normal.  No significant changes.  Lab Findings: Lab Results  Component Value Date   WBC 3.8* 07/10/2012   HGB 12.8 07/10/2012   HCT 39.1 07/10/2012   MCV 94.2 07/10/2012   PLT 294 07/10/2012   Radiographic Findings: Mr Laqueta Jean GN Contrast  07/03/2012  *RADIOLOGY REPORT*  Clinical Data: History of gliosarcoma.  Restaging.  Stereotactic radiosurgery.  Postop resection.  MRI HEAD WITHOUT AND WITH CONTRAST  Technique:  Multiplanar, multiecho pulse sequences of the brain and surrounding structures were obtained according to standard protocol without and with intravenous contrast  Contrast: 14mL MULTIHANCE GADOBENATE DIMEGLUMINE 529 MG/ML IV SOLN  Comparison: MRI 05/02/2012, MRI 03/11/2012  Findings: Postop left temporal craniotomy for tumor resection. Postoperative changes left temporal lobe with a postsurgical cavity containing hemosiderin is unchanged.  Hyperintensity in the surrounding white matter is mild and stable.  There is generalized atrophy.  No hydrocephalous or shift of the midline structures. Diffusion weighted imaging is negative.  No acute infarct.  Postcontrast images reveal continued improvement in the areas of nodular enhancement in the left frontal lobe anterior to the surgical cavity.  2.5 mm solid enhancing nodule previously measured 4.5 mm.  Immediately posterior this is a 4 mm  ring enhancing nodule which previously measured 5 mm.  Just behind this nodule there is an enhancing vessel compatible with developmental venous anomaly which is felt to be an incidental finding and unchanged.  There is mild enhancement involving the anterior insula on the left compatible with postop change or residual tumor.  This is stable. Previously  noted abnormal enhancement in the left caudate no longer visualized.  No new areas of enhancement are identified.  Mild mucosal thickening in the paranasal sinuses.  IMPRESSION: Two of nodular areas of enhancement in the left inferior frontal temporal lobe show improvement.  Postop enhancement in the surgical bed in the left anterior insula is stable.  No new area of tumor  enhancement is present.  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   Original Report Authenticated By: Janeece Riggers, M.D.     Impression:  The patient currently exhibits no evidence of recurrent disease. She maintains an excellent performance status.  Plan:  Repeat brain MRI in 2 months in conjunction with brain tumor conference and followup in the clinic.  _____________________________________  Artist Pais. Kathrynn Running, M.D.

## 2012-07-10 NOTE — Progress Notes (Signed)
OFFICE PROGRESS NOTE  CC  [name],[name], MD [address] [place] [address] Dr. 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  PRIOR THERAPY:  #1 patient originally presented with 2-3 week loss of memory with trouble finding words. She had one episode of left parietal headache. Because of this she was seen by her internist an MRI of the brain was done on 12/13/2011 this showed a ring enhancing lesion in the left temporal lobe measuring 2.7 x 3.6 x 2.5 cm with 45 surrounding satellite lesions. She had a complete systemic workup including CT of the chest abdomen and pelvis that was negative. She went on to be seen by neurosurgery Dr. Luiz Maxwell and patient underwent subtotal resection of the dominant lesion on October 14. 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 She went on to see Dr. Fatima Maxwell for 2 to who has been treating her she was also seen by Dr. Tyler Maxwell.  #2 patient was begun on radiation therapy with concurrent Temodar at 75 mg per meter squared. She completed concurrent Temodar and radiation therapy in December 2013.  #3 patient was seen at [facility] as well as [facility]. [facility] has recommended the patient began adjuvant Temodar at 150 milligrams per meter squared she tolerates the first cycle do we could increase the dose to 200 mg per meter squared days one through 5 on a 28 day cycle.  #4 patient began adjuvant Temodar cycle 1 on 03/13/12 through 03/18/12 initially at 150 mg per meter squared. She tolerated this well. Then the recommendation was to increase the dose initially at a higher dose of 200 mg per meter squared. However she could not tolerate this. So the third cycle was reduced to 150 mg per meter squared days one through 5.  CURRENT THERAPY:patient will begin cycle #5 of Temodar days  1 through 5 , at 150mg  per meter squared, 270mg   INTERVAL HISTORY: Kelly Maxwell 67 y.o. female returns today for evaluation prior to her Temodar.  She had an appt at [facility] with Dr. Veryl Maxwell and an MRI that revealed stable disease.  Due to the nausea, vomiting, and dehydration she decreased the recommended Temodar dose.  Otherwise, Kelly Maxwell is doing well today.  Denies fevers, chills, nausea, vomiting, or any other problems.    MEDICAL HISTORY: Past Medical History  Diagnosis Date  . PONV (postoperative nausea and vomiting)   . Depression   . GERD (gastroesophageal reflux disease)   . Headache   . HLD (hyperlipidemia)   . Brain tumor 12/18/2011    Left temporal lobe 2.7 x 3.6 x 2.5 cm ring enhancing; numerous small surrounding satellite lesions 12/13/11  . 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 Hiatal hernia   . History of radiation therapy 01/07/2012-02/18/12    left temporal  58GY    ALLERGIES:  is allergic to codeine and compazine.  MEDICATIONS:  Current Outpatient Prescriptions  Medication Sig Dispense Refill  . aspirin EC 81 MG tablet Take 81 mg by mouth at bedtime.      Marland Kitchen buPROPion (WELLBUTRIN XL) 300 MG 24 hr tablet Take 300 mg by mouth every morning.      . Calcium Carbonate-Vitamin D (CALCIUM + D PO) Take 1 tablet by mouth 2 (two) times daily.      . cholecalciferol (VITAMIN D) 1000  UNITS tablet Take 1,000 Units by mouth daily.      . clonazePAM (KLONOPIN) 0.5 MG tablet Take 0.5 mg by mouth at bedtime as needed.      Marland Kitchen estradiol (ESTRACE) 0.5 MG tablet       . medroxyPROGESTERone (PROVERA) 2.5 MG tablet       . Melatonin 3 MG CAPS Take 1 capsule (3 mg total) by mouth at bedtime as needed.    0  . Multiple Vitamin (MULTIVITAMIN WITH MINERALS) TABS Take 1 tablet by mouth daily.      . ondansetron (ZOFRAN-ODT) 8 MG disintegrating tablet Take 1 tablet (8 mg total) by mouth every 8 (eight) hours as needed for nausea.  30 tablet  12  . pantoprazole  (PROTONIX) 40 MG tablet Take 40 mg by mouth daily.      . promethazine (PHENERGAN) 25 MG suppository Place 1 suppository (25 mg total) rectally every 6 (six) hours as needed for nausea.  20 each  3  . SANCUSO 3.1 MG/24HR       . simvastatin (ZOCOR) 40 MG tablet Take 40 mg by mouth at bedtime.      . temozolomide (TEMODAR) 20 MG capsule Take 1 capsule (20 mg total) by mouth daily. May take on an empty stomach or at bedtime to decrease nausea & vomiting.  5 capsule  0  . temozolomide (TEMODAR) 250 MG capsule Take 1 capsule (250 mg total) by mouth daily. May take on an empty stomach or at bedtime to decrease nausea & vomiting.  5 capsule  0  . tretinoin (RETIN-A) 0.025 % cream Apply 1 application topically daily.      Marland Kitchen ALPRAZolam (XANAX) 0.25 MG tablet Take 1-2 tablets (0.25-0.5 mg total) by mouth 3 (three) times daily as needed for sleep (15 minutes prior to radiation daily).  30 tablet  0  . clotrimazole (MYCELEX) 10 MG troche Take 1 lozenge (10 mg total) by mouth 3 (three) times daily.  21 lozenge  0  . dexamethasone (DECADRON) 4 MG tablet Take 1 mg by mouth daily.       Marland Kitchen HYDROcodone-acetaminophen (NORCO/VICODIN) 5-325 MG per tablet Take 1 tablet by mouth every 4 (four) hours as needed for pain.  51 tablet  0  . modafinil (PROVIGIL) 100 MG tablet Take 1 tablet (100 mg total) by mouth daily.  30 tablet  5  . valACYclovir (VALTREX) 500 MG tablet Take 1 tablet (500 mg total) by mouth daily.  30 tablet  6   No current facility-administered medications for this visit.    SURGICAL HISTORY:  Past Surgical History  Procedure Laterality Date  . Total knee arthroplasty  2000    left  . Bunionectomy  2001    right  . Craniotomy  12/17/2011    Procedure: CRANIOTOMY TUMOR EXCISION;  Surgeon: Kelly Connors, MD;  Location: [place] [facility];  Service: Neurosurgery;  Laterality: Left;  LEFT temporal craniotomy with stealth for tumor resection  . Dilation and curettage of uterus  2007    REVIEW OF SYSTEMS:    General: fatigue (-), night sweats (-), fever (-), pain (-) Lymph: palpable nodes (-) HEENT: vision changes (-), mucositis (-), gum bleeding (-), epistaxis (-) Cardiovascular: chest pain (-), palpitations (-) Pulmonary: shortness of breath (-), dyspnea on exertion (-), cough (-), hemoptysis (-) GI:  Early satiety (-), melena (-), dysphagia (-), nausea/vomiting (-), diarrhea (-) GU: dysuria (-), hematuria (-), incontinence (-) Musculoskeletal: joint swelling (-), joint pain (-), back pain (-)  Neuro: weakness (-), numbness (-), headache (-), confusion (-) Skin: Rash (-), lesions (-), dryness (-) Psych: depression (-), suicidal/homicidal ideation (-), feeling of hopelessness (-)    PHYSICAL EXAMINATION: Blood pressure 137/64, pulse 80, temperature 97.9 F (36.6 C), temperature source Oral, resp. rate 20, height 5\' 4"  (1.626 m), weight 151 lb 4.8 oz (68.629 kg). Body mass index is 25.96 kg/(m^2). General: Patient is a well appearing female in no acute distress HEENT: PERRLA, sclerae anicteric no conjunctival pallor, MMM Neck: supple, no palpable adenopathy Lungs: clear to auscultation bilaterally, no wheezes, rhonchi, or rales Cardiovascular: regular rate rhythm, S1, S2, no murmurs, rubs or gallops Abdomen: Soft, non-tender, non-distended, normoactive bowel sounds, no HSM Extremities: warm and well perfused, no clubbing, cyanosis, or edema Skin: No rashes or lesions Neuro: CN II-XII intact ECOG PERFORMANCE STATUS: 1 - Symptomatic but completely ambulatory   LABORATORY DATA: Lab Results  Component Value Date   WBC 3.8* 07/10/2012   HGB 12.8 07/10/2012   HCT 39.1 07/10/2012   MCV 94.2 07/10/2012   PLT 294 07/10/2012      Chemistry      Component Value Date/Time   NA 141 06/12/2012 1523   NA 138 12/18/2011 0440   K 4.2 06/12/2012 1523   K 4.0 12/18/2011 0440   CL 106 06/12/2012 1523   CL 107 12/18/2011 0440   CO2 27 06/12/2012 1523   CO2 22 12/18/2011 0440   BUN 11.6 06/12/2012 1523    BUN 17 12/18/2011 0440   CREATININE 0.7 06/12/2012 1523   CREATININE 0.50 12/18/2011 0440   CREATININE 0.50 12/12/2011 0000      Component Value Date/Time   CALCIUM 10.1 06/12/2012 1523   CALCIUM 8.7 12/18/2011 0440   ALKPHOS 75 06/12/2012 1523   AST 20 06/12/2012 1523   ALT 24 06/12/2012 1523   BILITOT 0.90 06/12/2012 1523       RADIOGRAPHIC STUDIES:  No results found.  ASSESSMENT: 67 year old female with  #1 WHO grade 4 glioblastoma multiforme. She is status post partial resection on 12/17/2011. She has completed Temodar and concurrent radiation therapy in December 2013.  #2 on 1/10/ 2014 through 03/18/2012 patient received adjuvant Temodar at 150 mg per meter squared. She has tolerated this dose very well. Because of good tolerance we will plan on increasing her dose to 200 mg per meter squared per [facility] recommendations. Her cycle 2 will begin on 04/17/2012 through 04/21/2012. She gets her Temodar from [facility] and we will order this prior to her next cycle. Her total dose of Temodar for cycle 2 will be 325 mg days one through 5.  #3 patient dose of Temodar was reduced 150 mg per meter squared for a total of 270 mg days one through 5. Due to increased toxicity with the higher dose of 200 mg per meter squared as per [facility] recommendations. Patient is now here for cycle 5 days one through 5 of Temodar.  PLAN:   #1 Doing well.  I will prescribe the temodar as recommended.  I have given the scripts to the nurse to fax.    #2  We will see her back in 4 weeks for f/u.  All questions were answered. The patient knows to call the clinic with any problems, questions or concerns. We can certainly see the patient much sooner if necessary.  I spent 25 minutes counseling the patient face to face. The total time spent in the appointment was 30 minutes.  Drue Second, MD Medical/Oncology [facility]  [phone] (beeper) [phone] (Office)  07/10/2012, 12:41 PM

## 2012-07-10 NOTE — Telephone Encounter (Signed)
Patients husband called to provide their pharmacy information for Temodar prescription. Prescription given to Axel Filler in managed care for preauthorization.  [facility] fax [phone], Contact # [phone]  Patient requesting a three month supply.  LVMOM with Ebony re nfromation provided by spouse.

## 2012-07-10 NOTE — Patient Instructions (Signed)
Proceed with temodar 270 mg  Days 1 -5   I will see you back in 1 month on 6/10

## 2012-07-10 NOTE — Progress Notes (Signed)
Ms. Kelly Maxwell here today for FU appt. 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  She denies any headaches, vision changes, fine motor deficit, nor ataxia. She reports that her left eyelid droop is better, but notes by bedtime it worsens.  She reports that her energy level is good and that she has a good appetite.  Seen by Dr. Welton Flakes prior to this visit.

## 2012-07-15 ENCOUNTER — Telehealth: Payer: Self-pay | Admitting: *Deleted

## 2012-07-15 NOTE — Telephone Encounter (Signed)
Called and spoke with patient to reschedule her appt. Due to Dr. Milta Deiters day off.  Confirmed appt. For 08/04/12 at 1030 for lab and md at 1100am.

## 2012-07-17 ENCOUNTER — Other Ambulatory Visit: Payer: Medicare Other

## 2012-07-21 ENCOUNTER — Ambulatory Visit: Payer: Medicare Other | Admitting: Radiation Oncology

## 2012-08-03 ENCOUNTER — Encounter: Payer: Self-pay | Admitting: Radiation Oncology

## 2012-08-03 NOTE — Progress Notes (Signed)
rescheduled

## 2012-08-04 ENCOUNTER — Other Ambulatory Visit (HOSPITAL_BASED_OUTPATIENT_CLINIC_OR_DEPARTMENT_OTHER): Payer: Medicare Other | Admitting: Lab

## 2012-08-04 ENCOUNTER — Other Ambulatory Visit: Payer: Self-pay | Admitting: Radiation Therapy

## 2012-08-04 ENCOUNTER — Ambulatory Visit
Admission: RE | Admit: 2012-08-04 | Discharge: 2012-08-04 | Disposition: A | Discharge status: Home / Self Care | Payer: Medicare Other | Source: Ambulatory Visit | Attending: Radiation Oncology | Admitting: Radiation Oncology

## 2012-08-04 ENCOUNTER — Encounter: Payer: Self-pay | Admitting: Oncology

## 2012-08-04 ENCOUNTER — Telehealth: Payer: Self-pay | Admitting: Oncology

## 2012-08-04 ENCOUNTER — Ambulatory Visit (HOSPITAL_BASED_OUTPATIENT_CLINIC_OR_DEPARTMENT_OTHER): Payer: Medicare Other | Admitting: Oncology

## 2012-08-04 VITALS — BP 143/81 | HR 72 | Temp 98.3°F | Resp 20 | Ht 65.0 in | Wt 149.7 lb

## 2012-08-04 DIAGNOSIS — C719 Malignant neoplasm of brain, unspecified: Secondary | ICD-10-CM

## 2012-08-04 DIAGNOSIS — D496 Neoplasm of unspecified behavior of brain: Secondary | ICD-10-CM

## 2012-08-04 LAB — CBC WITH DIFFERENTIAL/PLATELET
BASO%: 0.3 % (ref 0.0–2.0)
Eosinophils Absolute: 0.1 10*3/uL (ref 0.0–0.5)
MCHC: 33.6 g/dL (ref 31.5–36.0)
MCV: 92.6 fL (ref 79.5–101.0)
MONO#: 0.2 10*3/uL (ref 0.1–0.9)
MONO%: 6.3 % (ref 0.0–14.0)
NEUT#: 2.9 10*3/uL (ref 1.5–6.5)
RBC: 4.14 10*6/uL (ref 3.70–5.45)
RDW: 14.1 % (ref 11.2–14.5)
WBC: 3.7 10*3/uL — ABNORMAL LOW (ref 3.9–10.3)

## 2012-08-04 LAB — COMPREHENSIVE METABOLIC PANEL (CC13)
ALT: 16 U/L (ref 0–55)
Albumin: 3.4 g/dL — ABNORMAL LOW (ref 3.5–5.0)
Alkaline Phosphatase: 70 U/L (ref 40–150)
Glucose: 86 mg/dl (ref 70–99)
Potassium: 4.2 mEq/L (ref 3.5–5.1)
Sodium: 143 mEq/L (ref 136–145)
Total Bilirubin: 0.64 mg/dL (ref 0.20–1.20)
Total Protein: 6 g/dL — ABNORMAL LOW (ref 6.4–8.3)

## 2012-08-04 MED ORDER — TEMOZOLOMIDE 250 MG PO CAPS: 250.0000 mg | ORAL_CAPSULE | Freq: Every day | ORAL | Status: DC

## 2012-08-04 MED ORDER — TEMOZOLOMIDE 20 MG PO CAPS: 20.0000 mg | ORAL_CAPSULE | Freq: Every day | ORAL | Status: DC

## 2012-08-04 NOTE — Progress Notes (Signed)
Faxed temodar prescriptions to [facility] @ [phone], phone # [phone] ext. [phone].

## 2012-08-04 NOTE — Patient Instructions (Addendum)
Proceed with temodar 270 mg daily starting June 10  - 13.  We will see you back on July 3 for follow up and start of next cycle

## 2012-08-12 ENCOUNTER — Ambulatory Visit: Payer: Medicare Other | Admitting: Oncology

## 2012-08-12 ENCOUNTER — Other Ambulatory Visit: Payer: Medicare Other | Admitting: Lab

## 2012-08-25 NOTE — Progress Notes (Signed)
OFFICE PROGRESS NOTE  CC  [name],[name], MD [address] [place] [address] Dr. 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  PRIOR THERAPY:  #1 patient originally presented with 2-3 week loss of memory with trouble finding words. She had one episode of left parietal headache. Because of this she was seen by her internist an MRI of the brain was done on 12/13/2011 this showed a ring enhancing lesion in the left temporal lobe measuring 2.7 x 3.6 x 2.5 cm with 45 surrounding satellite lesions. She had a complete systemic workup including CT of the chest abdomen and pelvis that was negative. She went on to be seen by neurosurgery Dr. Phoebe Perch and patient underwent subtotal resection of the dominant lesion on October 14. 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 She went on to see Dr. Kennedy Bucker for 2 to who has been treating her she was also seen by Dr. Margaretmary Dys.  #2 patient was begun on radiation therapy with concurrent Temodar at 75 mg per meter squared. She completed concurrent Temodar and radiation therapy in December 2013.  #3 patient was seen at [facility] as well as [facility]. [facility] has recommended the patient began adjuvant Temodar at 150 milligrams per meter squared she tolerates the first cycle do we could increase the dose to 200 mg per meter squared days one through 5 on a 28 day cycle.  #4 patient began adjuvant Temodar cycle 1 on 03/13/12 through 03/18/12 initially at 150 mg per meter squared. She tolerated this well. Then the recommendation was to increase the dose initially at a higher dose of 200 mg per meter squared. However she could not tolerate this. So the third cycle was reduced to 150 mg per meter squared days one through 5.  CURRENT THERAPY:patient will begin cycle #6 of Temodar days  1 through 5 , at 150mg  per meter squared, 270mg . Patient will begin this June 10-14.  INTERVAL HISTORY: Kelly Maxwell 67 y.o. female returns today for evaluation prior to her Temodar.  Overall patient is doing well. She is now going to receive cycle 6 of her 100 mg per meter squared Temodar days 1 through 5. So far she is tolerating it well. She is very pleasant surprise that the quality of life but she does have. She has no headaches double vision blurring of vision no fevers chills or night sweats no peripheral paresthesias no cough hemoptysis or hematemesis no difficulty in ambulation. Remainder of the 10th of systems is negative  MEDICAL HISTORY: Past Medical History  Diagnosis Date  . PONV (postoperative nausea and vomiting)   . Depression   . GERD (gastroesophageal reflux disease)   . Headache(784.0)   . HLD (hyperlipidemia)   . Brain tumor 12/18/2011    Left temporal lobe 2.7 x 3.6 x 2.5 cm ring enhancing; numerous small surrounding satellite lesions 12/13/11  . 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 Hiatal hernia   . History of radiation therapy 01/07/2012-02/18/12    left temporal  58GY    ALLERGIES:  is allergic to codeine and compazine.  MEDICATIONS:  Current Outpatient Prescriptions  Medication Sig Dispense Refill  . ALPRAZolam (XANAX) 0.25 MG tablet Take 1-2 tablets (0.25-0.5 mg total) by mouth 3 (three) times daily as needed for sleep (15 minutes prior to radiation daily).  30  tablet  0  . aspirin EC 81 MG tablet Take 81 mg by mouth at bedtime.      Marland Kitchen buPROPion (WELLBUTRIN XL) 300 MG 24 hr tablet Take 300 mg by mouth every morning.      . Calcium Carbonate-Vitamin D (CALCIUM + D PO) Take 1 tablet by mouth 2 (two) times daily.      . cholecalciferol (VITAMIN D) 1000 UNITS tablet Take 1,000 Units by mouth daily.      . clonazePAM (KLONOPIN) 0.5 MG tablet Take 0.5 mg by mouth at bedtime as needed.      Marland Kitchen estradiol (ESTRACE) 0.5 MG tablet       .  medroxyPROGESTERone (PROVERA) 2.5 MG tablet       . Melatonin 3 MG CAPS Take 1 capsule (3 mg total) by mouth at bedtime as needed.    0  . Multiple Vitamin (MULTIVITAMIN WITH MINERALS) TABS Take 1 tablet by mouth daily.      . ondansetron (ZOFRAN-ODT) 8 MG disintegrating tablet Take 1 tablet (8 mg total) by mouth every 8 (eight) hours as needed for nausea.  30 tablet  12  . pantoprazole (PROTONIX) 40 MG tablet Take 40 mg by mouth daily.      . promethazine (PHENERGAN) 25 MG suppository Place 1 suppository (25 mg total) rectally every 6 (six) hours as needed for nausea.  20 each  3  . simvastatin (ZOCOR) 40 MG tablet Take 40 mg by mouth at bedtime.      . temozolomide (TEMODAR) 20 MG capsule Take 1 capsule (20 mg total) by mouth daily. May take on an empty stomach or at bedtime to decrease nausea & vomiting.  5 capsule  0  . temozolomide (TEMODAR) 250 MG capsule Take 1 capsule (250 mg total) by mouth daily. May take on an empty stomach or at bedtime to decrease nausea & vomiting.  5 capsule  0  . tretinoin (RETIN-A) 0.025 % cream Apply 1 application topically daily.      . clotrimazole (MYCELEX) 10 MG troche Take 1 lozenge (10 mg total) by mouth 3 (three) times daily.  21 lozenge  0  . dexamethasone (DECADRON) 4 MG tablet Take 1 mg by mouth daily.       Marland Kitchen HYDROcodone-acetaminophen (NORCO/VICODIN) 5-325 MG per tablet Take 1 tablet by mouth every 4 (four) hours as needed for pain.  51 tablet  0  . modafinil (PROVIGIL) 100 MG tablet Take 1 tablet (100 mg total) by mouth daily.  30 tablet  5  . SANCUSO 3.1 MG/24HR       . valACYclovir (VALTREX) 500 MG tablet Take 1 tablet (500 mg total) by mouth daily.  30 tablet  6   No current facility-administered medications for this visit.    SURGICAL HISTORY:  Past Surgical History  Procedure Laterality Date  . Total knee arthroplasty  2000    left  . Bunionectomy  2001    right  . Craniotomy  12/17/2011    Procedure: CRANIOTOMY TUMOR EXCISION;  Surgeon:  Clydene Fake, MD;  Location: [facility];  Service: Neurosurgery;  Laterality: Left;  LEFT temporal craniotomy with stealth for tumor resection  . Dilation and curettage of uterus  2007    REVIEW OF SYSTEMS:   General: fatigue (-), night sweats (-), fever (-), pain (-) Lymph: palpable nodes (-) HEENT: vision changes (-), mucositis (-), gum bleeding (-), epistaxis (-) Cardiovascular: chest pain (-), palpitations (-) Pulmonary: shortness of breath (-), dyspnea on  exertion (-), cough (-), hemoptysis (-) GI:  Early satiety (-), melena (-), dysphagia (-), nausea/vomiting (-), diarrhea (-) GU: dysuria (-), hematuria (-), incontinence (-) Musculoskeletal: joint swelling (-), joint pain (-), back pain (-) Neuro: weakness (-), numbness (-), headache (-), confusion (-) Skin: Rash (-), lesions (-), dryness (-) Psych: depression (-), suicidal/homicidal ideation (-), feeling of hopelessness (-)    PHYSICAL EXAMINATION: Blood pressure 143/81, pulse 72, temperature 98.3 F (36.8 C), temperature source Oral, resp. rate 20, height 5\' 5"  (1.651 m), weight 149 lb 11.2 oz (67.903 kg). Body mass index is 24.91 kg/(m^2). General: Patient is a well appearing female in no acute distress HEENT: PERRLA, sclerae anicteric no conjunctival pallor, MMM Neck: supple, no palpable adenopathy Lungs: clear to auscultation bilaterally, no wheezes, rhonchi, or rales Cardiovascular: regular rate rhythm, S1, S2, no murmurs, rubs or gallops Abdomen: Soft, non-tender, non-distended, normoactive bowel sounds, no HSM Extremities: warm and well perfused, no clubbing, cyanosis, or edema Skin: No rashes or lesions Neuro: CN II-XII intact ECOG PERFORMANCE STATUS: 1 - Symptomatic but completely ambulatory   LABORATORY DATA: Lab Results  Component Value Date   WBC 3.7* 08/04/2012   HGB 12.9 08/04/2012   HCT 38.4 08/04/2012   MCV 92.6 08/04/2012   PLT 261 08/04/2012      Chemistry      Component Value Date/Time   NA 143  08/04/2012 1008   NA 138 12/18/2011 0440   K 4.2 08/04/2012 1008   K 4.0 12/18/2011 0440   CL 111* 08/04/2012 1008   CL 107 12/18/2011 0440   CO2 24 08/04/2012 1008   CO2 22 12/18/2011 0440   BUN 12.5 08/04/2012 1008   BUN 17 12/18/2011 0440   CREATININE 0.7 08/04/2012 1008   CREATININE 0.50 12/18/2011 0440   CREATININE 0.50 12/12/2011 0000      Component Value Date/Time   CALCIUM 9.4 08/04/2012 1008   CALCIUM 8.7 12/18/2011 0440   ALKPHOS 70 08/04/2012 1008   AST 14 08/04/2012 1008   ALT 16 08/04/2012 1008   BILITOT 0.64 08/04/2012 1008       RADIOGRAPHIC STUDIES:  No results found.  ASSESSMENT: 67 year old female with  #1 WHO grade 4 glioblastoma multiforme. She is status post partial resection on 12/17/2011. She has completed Temodar and concurrent radiation therapy in December 2013.  #2 on 1/10/ 2014 through 03/18/2012 patient received adjuvant Temodar at 150 mg per meter squared. She has tolerated this dose very well. Because of good tolerance we will plan on increasing her dose to 200 mg per meter squared per [facility] recommendations. Her cycle 2 will begin on 04/17/2012 through 04/21/2012. She gets her Temodar from [facility] and we will order this prior to her next cycle. Her total dose of Temodar for cycle 2 will be 325 mg days one through 5.  #3 patient dose of Temodar was reduced 150 mg per meter squared for a total of 270 mg days one through 5. Due to increased toxicity with the higher dose of 200 mg per meter squared as per [facility] recommendations. Patient is now here for cycle 6 days one through 5 of Temodar.she will proceed with the Temodar from June 10 -13.  PLAN:  #1 proceed with scheduled cycle 6 of day 1 through 5 of Temodar.  #2patient will be seen back on 09/04/2012 for follow up and start of cycle 7  #3 patient does have an upcoming appointment at [facility] for review of scans as well as an office visit.  All  questions were answered. The patient knows to call the clinic with any  problems, questions or concerns. We can certainly see the patient much sooner if necessary.  I spent 25 minutes counseling the patient face to face. The total time spent in the appointment was 30 minutes.  Marcy Panning, MD Medical/Oncology [facility] [phone] (beeper) [phone] (Office)

## 2012-08-28 ENCOUNTER — Ambulatory Visit
Admission: RE | Admit: 2012-08-28 | Discharge: 2012-08-28 | Disposition: A | Discharge status: Home / Self Care | Payer: Medicare Other | Source: Ambulatory Visit | Attending: Radiation Oncology | Admitting: Radiation Oncology

## 2012-08-28 ENCOUNTER — Encounter: Payer: Self-pay | Admitting: Radiation Oncology

## 2012-08-28 DIAGNOSIS — D496 Neoplasm of unspecified behavior of brain: Secondary | ICD-10-CM

## 2012-08-28 MED ORDER — GADOBENATE DIMEGLUMINE 529 MG/ML IV SOLN
14.0000 mL | Freq: Once | INTRAVENOUS | Status: AC | PRN
  Administered 2012-08-28: 14 mL via INTRAVENOUS

## 2012-08-29 ENCOUNTER — Telehealth: Payer: Self-pay | Admitting: Radiation Oncology

## 2012-08-29 ENCOUNTER — Encounter: Payer: Self-pay | Admitting: Radiation Oncology

## 2012-08-29 NOTE — Telephone Encounter (Signed)
Opened in error

## 2012-09-03 ENCOUNTER — Ambulatory Visit
Admission: RE | Admit: 2012-09-03 | Discharge: 2012-09-03 | Disposition: A | Discharge status: Home / Self Care | Payer: Medicare Other | Source: Ambulatory Visit | Attending: Radiation Oncology | Admitting: Radiation Oncology

## 2012-09-03 ENCOUNTER — Encounter: Payer: Self-pay | Admitting: Adult Health

## 2012-09-03 ENCOUNTER — Other Ambulatory Visit: Payer: Self-pay | Admitting: Emergency Medicine

## 2012-09-03 ENCOUNTER — Telehealth: Payer: Self-pay | Admitting: Oncology

## 2012-09-03 ENCOUNTER — Encounter: Payer: Self-pay | Admitting: Radiation Oncology

## 2012-09-03 ENCOUNTER — Other Ambulatory Visit (HOSPITAL_BASED_OUTPATIENT_CLINIC_OR_DEPARTMENT_OTHER): Payer: Medicare Other | Admitting: Lab

## 2012-09-03 ENCOUNTER — Ambulatory Visit (HOSPITAL_BASED_OUTPATIENT_CLINIC_OR_DEPARTMENT_OTHER): Payer: Medicare Other | Admitting: Adult Health

## 2012-09-03 VITALS — BP 157/70 | HR 71 | Temp 98.5°F | Resp 20 | Ht 65.0 in | Wt 147.0 lb

## 2012-09-03 VITALS — BP 137/59 | HR 73 | Temp 98.5°F | Resp 16 | Wt 147.6 lb

## 2012-09-03 DIAGNOSIS — D496 Neoplasm of unspecified behavior of brain: Secondary | ICD-10-CM

## 2012-09-03 DIAGNOSIS — C712 Malignant neoplasm of temporal lobe: Secondary | ICD-10-CM

## 2012-09-03 LAB — COMPREHENSIVE METABOLIC PANEL (CC13)
AST: 18 U/L (ref 5–34)
Albumin: 3.6 g/dL (ref 3.5–5.0)
Alkaline Phosphatase: 84 U/L (ref 40–150)
Glucose: 73 mg/dl (ref 70–140)
Potassium: 4.5 mEq/L (ref 3.5–5.1)
Sodium: 142 mEq/L (ref 136–145)
Total Bilirubin: 0.45 mg/dL (ref 0.20–1.20)
Total Protein: 6.5 g/dL (ref 6.4–8.3)

## 2012-09-03 LAB — CBC WITH DIFFERENTIAL/PLATELET
BASO%: 0.8 % (ref 0.0–2.0)
EOS%: 3.1 % (ref 0.0–7.0)
Eosinophils Absolute: 0.1 10*3/uL (ref 0.0–0.5)
LYMPH%: 15 % (ref 14.0–49.7)
MCH: 31.2 pg (ref 25.1–34.0)
MCHC: 34.4 g/dL (ref 31.5–36.0)
MCV: 90.7 fL (ref 79.5–101.0)
MONO%: 8.3 % (ref 0.0–14.0)
NEUT#: 2.3 10*3/uL (ref 1.5–6.5)
Platelets: 270 10*3/uL (ref 145–400)
RBC: 4.26 10*6/uL (ref 3.70–5.45)
RDW: 14.4 % (ref 11.2–14.5)

## 2012-09-03 MED ORDER — TEMOZOLOMIDE 250 MG PO CAPS: 250.0000 mg | ORAL_CAPSULE | Freq: Every day | ORAL | Status: DC

## 2012-09-03 MED ORDER — TEMOZOLOMIDE 20 MG PO CAPS: 20.0000 mg | ORAL_CAPSULE | Freq: Every day | ORAL | Status: DC

## 2012-09-03 NOTE — Patient Instructions (Addendum)
Start Temodar July 10-14.  We will see you back 10/07/12.  Please call us if you have any questions or concerns.

## 2012-09-03 NOTE — Progress Notes (Signed)
OFFICE PROGRESS NOTE  CC  [name],[name], MD [address] [place] [address] Dr. 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  PRIOR THERAPY:  #1 patient originally presented with 2-3 week loss of memory with trouble finding words. She had one episode of left parietal headache. Because of this she was seen by her internist an MRI of the brain was done on 12/13/2011 this showed a ring enhancing lesion in the left temporal lobe measuring 2.7 x 3.6 x 2.5 cm with 45 surrounding satellite lesions. She had a complete systemic workup including CT of the chest abdomen and pelvis that was negative. She went on to be seen by neurosurgery Dr. Phoebe Perch and patient underwent subtotal resection of the dominant lesion on October 14. 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 She went on to see Dr. Kennedy Bucker for 2 to who has been treating her she was also seen by Dr. Margaretmary Dys.  #2 patient was begun on radiation therapy with concurrent Temodar at 75 mg per meter squared. She completed concurrent Temodar and radiation therapy in December 2013.  #3 patient was seen at [facility] as well as [facility]. [facility] has recommended the patient began adjuvant Temodar at 150 milligrams per meter squared she tolerates the first cycle do we could increase the dose to 200 mg per meter squared days one through 5 on a 28 day cycle.  #4 patient began adjuvant Temodar cycle 1 on 03/13/12 through 03/18/12 initially at 150 mg per meter squared. She tolerated this well. Then the recommendation was to increase the dose initially at a higher dose of 200 mg per meter squared. However she could not tolerate this. So the third cycle was reduced to 150 mg per meter squared days one through 5.  CURRENT THERAPY:patient will begin cycle #6 of Temodar days  1 through 5 , at 150mg  per meter squared, 270mg . Patient will begin this June 10-14.  INTERVAL HISTORY: Kelly Maxwell 67 y.o. female returns today for evaluation prior to her Temodar.  Overall patient is doing well. She continues to tolerate her Temodar well.  She is planning on going out of town and will not start the medication until July 10.  She continues to have difficulty with expressive aphasia.  She had an appt at [facility] on Monday August 4, and they did not receive all of the MRI data from [facility].  She denies headaches, weakness, vision changes, numbness, or any further concerns.  She does take Zofran orally and Phenergan suppositories prior to Temodar and does not develop any nausea.  Otherwise, a 10 point ROS is negative.   MEDICAL HISTORY: Past Medical History  Diagnosis Date  . PONV (postoperative nausea and vomiting)   . Depression   . GERD (gastroesophageal reflux disease)   . Headache(784.0)   . HLD (hyperlipidemia)   . Brain tumor 12/18/2011    Left temporal lobe 2.7 x 3.6 x 2.5 cm ring enhancing; numerous small surrounding satellite lesions 12/13/11  . 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 Hiatal hernia   . History of radiation therapy 01/07/2012-02/18/12    left temporal  58GY    ALLERGIES:  is allergic to codeine and compazine.  MEDICATIONS:  Current Outpatient Prescriptions  Medication Sig Dispense Refill  . ALPRAZolam (XANAX) 0.25 MG tablet Take 1-2 tablets (  0.25-0.5 mg total) by mouth 3 (three) times daily as needed for sleep (15 minutes prior to radiation daily).  30 tablet  0  . aspirin EC 81 MG tablet Take 81 mg by mouth at bedtime.      Marland Kitchen buPROPion (WELLBUTRIN XL) 300 MG 24 hr tablet Take 300 mg by mouth every morning.      . Calcium Carbonate-Vitamin D (CALCIUM + D PO) Take 1 tablet by mouth 2 (two) times daily.      . cholecalciferol (VITAMIN D) 1000 UNITS tablet Take 1,000 Units by mouth daily.      . clonazePAM (KLONOPIN)  0.5 MG tablet Take 0.5 mg by mouth at bedtime as needed.      . clotrimazole (MYCELEX) 10 MG troche Take 1 lozenge (10 mg total) by mouth 3 (three) times daily.  21 lozenge  0  . dexamethasone (DECADRON) 4 MG tablet Take 1 mg by mouth daily.       Marland Kitchen estradiol (ESTRACE) 0.5 MG tablet       . HYDROcodone-acetaminophen (NORCO/VICODIN) 5-325 MG per tablet Take 1 tablet by mouth every 4 (four) hours as needed for pain.  51 tablet  0  . medroxyPROGESTERone (PROVERA) 2.5 MG tablet       . modafinil (PROVIGIL) 100 MG tablet Take 1 tablet (100 mg total) by mouth daily.  30 tablet  5  . Multiple Vitamin (MULTIVITAMIN WITH MINERALS) TABS Take 1 tablet by mouth daily.      Marland Kitchen omeprazole (PRILOSEC) 10 MG capsule Take 10 mg by mouth daily.      . ondansetron (ZOFRAN-ODT) 8 MG disintegrating tablet Take 1 tablet (8 mg total) by mouth every 8 (eight) hours as needed for nausea.  30 tablet  12  . pantoprazole (PROTONIX) 40 MG tablet Take 40 mg by mouth daily.      . promethazine (PHENERGAN) 25 MG suppository Place 1 suppository (25 mg total) rectally every 6 (six) hours as needed for nausea.  20 each  3  . SANCUSO 3.1 MG/24HR       . simvastatin (ZOCOR) 40 MG tablet Take 40 mg by mouth at bedtime.      . sulfamethoxazole-trimethoprim (BACTRIM DS) 800-160 MG per tablet       . temozolomide (TEMODAR) 20 MG capsule Take 1 capsule (20 mg total) by mouth daily. May take on an empty stomach or at bedtime to decrease nausea & vomiting.  5 capsule  0  . temozolomide (TEMODAR) 250 MG capsule Take 1 capsule (250 mg total) by mouth daily. May take on an empty stomach or at bedtime to decrease nausea & vomiting.  5 capsule  0  . tretinoin (RETIN-A) 0.025 % cream Apply 1 application topically daily.      . valACYclovir (VALTREX) 500 MG tablet Take 1 tablet (500 mg total) by mouth daily.  30 tablet  6  . Melatonin 3 MG CAPS Take 1 capsule (3 mg total) by mouth at bedtime as needed.    0   No current facility-administered  medications for this visit.    SURGICAL HISTORY:  Past Surgical History  Procedure Laterality Date  . Total knee arthroplasty  2000    left  . Bunionectomy  2001    right  . Craniotomy  12/17/2011    Procedure: CRANIOTOMY TUMOR EXCISION;  Surgeon: Otilio Connors, MD;  Location: [facility];  Service: Neurosurgery;  Laterality: Left;  LEFT temporal craniotomy with stealth for tumor resection  . Dilation  and curettage of uterus  2007    REVIEW OF SYSTEMS:   General: fatigue (-), night sweats (-), fever (-), pain (-) Lymph: palpable nodes (-) HEENT: vision changes (-), mucositis (-), gum bleeding (-), epistaxis (-) Cardiovascular: chest pain (-), palpitations (-) Pulmonary: shortness of breath (-), dyspnea on exertion (-), cough (-), hemoptysis (-) GI:  Early satiety (-), melena (-), dysphagia (-), nausea/vomiting (-), diarrhea (-) GU: dysuria (-), hematuria (-), incontinence (-) Musculoskeletal: joint swelling (-), joint pain (-), back pain (-) Neuro: weakness (-), numbness (-), headache (-), confusion (-) Skin: Rash (-), lesions (-), dryness (-) Psych: depression (-), suicidal/homicidal ideation (-), feeling of hopelessness (-)    PHYSICAL EXAMINATION: Blood pressure 157/70, pulse 71, temperature 98.5 F (36.9 C), temperature source Oral, resp. rate 20, height 5\' 5"  (1.651 m), weight 147 lb (66.679 kg). Body mass index is 24.46 kg/(m^2). General: Patient is a well appearing female in no acute distress HEENT: PERRLA, sclerae anicteric no conjunctival pallor, MMM Neck: supple, no palpable adenopathy Lungs: clear to auscultation bilaterally, no wheezes, rhonchi, or rales Cardiovascular: regular rate rhythm, S1, S2, no murmurs, rubs or gallops Abdomen: Soft, non-tender, non-distended, normoactive bowel sounds, no HSM Extremities: warm and well perfused, no clubbing, cyanosis, or edema Skin: No rashes or lesions Neuro: CN II-XII intact ECOG PERFORMANCE STATUS: 1 - Symptomatic but  completely ambulatory   LABORATORY DATA: Lab Results  Component Value Date   WBC 3.2* 09/03/2012   HGB 13.3 09/03/2012   HCT 38.6 09/03/2012   MCV 90.7 09/03/2012   PLT 270 09/03/2012      Chemistry      Component Value Date/Time   NA 143 08/04/2012 1008   NA 138 12/18/2011 0440   K 4.2 08/04/2012 1008   K 4.0 12/18/2011 0440   CL 111* 08/04/2012 1008   CL 107 12/18/2011 0440   CO2 24 08/04/2012 1008   CO2 22 12/18/2011 0440   BUN 12.5 08/04/2012 1008   BUN 17 12/18/2011 0440   CREATININE 0.7 08/04/2012 1008   CREATININE 0.50 12/18/2011 0440   CREATININE 0.50 12/12/2011 0000      Component Value Date/Time   CALCIUM 9.4 08/04/2012 1008   CALCIUM 8.7 12/18/2011 0440   ALKPHOS 70 08/04/2012 1008   AST 14 08/04/2012 1008   ALT 16 08/04/2012 1008   BILITOT 0.64 08/04/2012 1008       RADIOGRAPHIC STUDIES:  No results found.  ASSESSMENT: 67 year old female with  #1 WHO grade 4 glioblastoma multiforme. She is status post partial resection on 12/17/2011. She has completed Temodar and concurrent radiation therapy in December 2013.  #2 on 1/10/ 2014 through 03/18/2012 patient received adjuvant Temodar at 150 mg per meter squared. She has tolerated this dose very well. Because of good tolerance we will plan on increasing her dose to 200 mg per meter squared per [facility] recommendations. Her cycle 2 will begin on 04/17/2012 through 04/21/2012. She gets her Temodar from [facility] and we will order this prior to her next cycle. Her total dose of Temodar for cycle 2 will be 325 mg days one through 5.  #3 patient dose of Temodar was reduced 150 mg per meter squared for a total of 270 mg days one through 5. Due to increased toxicity with the higher dose of 200 mg per meter squared as per [facility] recommendations. Patient is now here for cycle 6 days one through 5 of Temodar.she will proceed with the Temodar from July 10-14.  PLAN:  #1 proceed  with scheduled cycle 7 of day 1 through 5 of Temodar.  #2patient will be seen  back on 10/07/2012 for follow up and start of cycle 8  #3 Will f/u on MRI results from [facility], and their recommendations once they have reviewed the data.    #4 Patient will return to [place] on 10/06/12 and to [place] on 10/07/12.    All questions were answered. The patient knows to call the clinic with any problems, questions or concerns. We can certainly see the patient much sooner if necessary.  I spent 25 minutes counseling the patient face to face. The total time spent in the appointment was 30 minutes.  Suzan Garibaldi Sheppard Co[place], [place] Phone: [phone]

## 2012-09-03 NOTE — Progress Notes (Signed)
Denies weakness of extremities. Denies dizziness. Reports energy level is good and she remains active. Reports nausea is under control. Denies headaches, diplopia, or hearing changes. Reports that she continues to take Klonopin and melatonin to aid in sleep.

## 2012-09-03 NOTE — Progress Notes (Signed)
Radiation Oncology         [phone] ________________________________  Name: Kelly Maxwell MRN: [id number]  Date: 09/03/2012  DOB: 04/19/1945  Multidisciplinary Neuro Oncology Clinic Follow-Up Visit Note  CC: [name], MD  Levert Feinstein, MD  Diagnosis:   67 year old woman s/p partial resection of a multifocal left temporal grade IV gliosarcoma s/p radiotherapy to 60 Gy - 01/07/2012-02/18/2012   Interval Since Last Radiation:  7  months  Narrative:  The patient returns today for routine follow-up.  The recent films were presented in our multidisciplinary conference with neuroradiology just prior to the clinic on Monday 6/30.  She is asymptomatic.                              ALLERGIES:  is allergic to codeine and compazine.  Meds: Current Outpatient Prescriptions  Medication Sig Dispense Refill  . aspirin EC 81 MG tablet Take 81 mg by mouth at bedtime.      Marland Kitchen buPROPion (WELLBUTRIN XL) 300 MG 24 hr tablet Take 300 mg by mouth every morning.      . Calcium Carbonate-Vitamin D (CALCIUM + D PO) Take 1 tablet by mouth 2 (two) times daily.      . cholecalciferol (VITAMIN D) 1000 UNITS tablet Take 1,000 Units by mouth daily.      . clonazePAM (KLONOPIN) 0.5 MG tablet Take 0.5 mg by mouth at bedtime as needed.      . Melatonin 3 MG CAPS Take 1 capsule (3 mg total) by mouth at bedtime as needed.    0  . Multiple Vitamin (MULTIVITAMIN WITH MINERALS) TABS Take 1 tablet by mouth daily.      Marland Kitchen omeprazole (PRILOSEC) 10 MG capsule Take 10 mg by mouth daily.      Marland Kitchen SANCUSO 3.1 MG/24HR       . simvastatin (ZOCOR) 40 MG tablet Take 40 mg by mouth at bedtime.      . temozolomide (TEMODAR) 20 MG capsule Take 1 capsule (20 mg total) by mouth daily. May take on an empty stomach or at bedtime to decrease nausea & vomiting.  5 capsule  0  . temozolomide (TEMODAR) 250 MG capsule Take 1 capsule (250 mg total) by mouth daily. May take on an empty stomach or at bedtime to decrease nausea &  vomiting.  5 capsule  0  . tretinoin (RETIN-A) 0.025 % cream Apply 1 application topically daily.      Marland Kitchen ALPRAZolam (XANAX) 0.25 MG tablet Take 1-2 tablets (0.25-0.5 mg total) by mouth 3 (three) times daily as needed for sleep (15 minutes prior to radiation daily).  30 tablet  0  . clotrimazole (MYCELEX) 10 MG troche Take 1 lozenge (10 mg total) by mouth 3 (three) times daily.  21 lozenge  0  . dexamethasone (DECADRON) 4 MG tablet Take 1 mg by mouth daily.       Marland Kitchen estradiol (ESTRACE) 0.5 MG tablet       . HYDROcodone-acetaminophen (NORCO/VICODIN) 5-325 MG per tablet Take 1 tablet by mouth every 4 (four) hours as needed for pain.  51 tablet  0  . medroxyPROGESTERone (PROVERA) 2.5 MG tablet       . modafinil (PROVIGIL) 100 MG tablet Take 1 tablet (100 mg total) by mouth daily.  30 tablet  5  . ondansetron (ZOFRAN-ODT) 8 MG disintegrating tablet Take 1 tablet (8 mg total) by mouth every 8 (eight) hours as needed for nausea.  30 tablet  12  . pantoprazole (PROTONIX) 40 MG tablet Take 40 mg by mouth daily.      . promethazine (PHENERGAN) 25 MG suppository Place 1 suppository (25 mg total) rectally every 6 (six) hours as needed for nausea.  20 each  3  . valACYclovir (VALTREX) 500 MG tablet Take 1 tablet (500 mg total) by mouth daily.  30 tablet  6   No current facility-administered medications for this encounter.    Physical Findings: The patient is in no acute distress. Patient is alert and oriented.  weight is 147 lb 9.6 oz (66.951 kg). Her oral temperature is 98.5 F (36.9 C). Her blood pressure is 137/59 and her pulse is 73. Her respiration is 16 and oxygen saturation is 94%. .  No significant changes.  Lab Findings: Lab Results  Component Value Date   WBC 3.7* 08/04/2012   HGB 12.9 08/04/2012   HCT 38.4 08/04/2012   MCV 92.6 08/04/2012   PLT 261 08/04/2012    @LASTCHEM @  Radiographic Findings: Mr Laqueta Jean JY Contrast  08/28/2012   *RADIOLOGY REPORT*  Clinical Data: Neoplasm of the  unspecified nature of brain. Restaging.  Gluteal sarcoma.  Status post stereotactic radiosurgery.  MRI HEAD WITHOUT AND WITH CONTRAST  Technique:  Multiplanar, multiecho pulse sequences of the brain and surrounding structures were obtained according to standard protocol without and with intravenous contrast  Contrast: 14mL MULTIHANCE GADOBENATE DIMEGLUMINE 529 MG/ML IV SOLN  Comparison: Multiple prior MRIs the brain, most recently 07/03/2012.  Findings: Solid nodular focus of enhancement in the anterior left frontal lobe now measures 2.5 mm compared with 3.5 mm on the prior study.  Measuring to the outer rim of enhancement, the more inferior rim enhancing nodule now measures 3.5 mm compared with 5.2 mm on the prior study.  The focus of enhancement within the anterior sylvian fissure is stable.  A subtle new 3 mm focus of enhancement is evident slightly more superiorly, subjacent to the left insular cortex fissure.  No other new foci of enhancement are evident.  Postsurgical changes in the left temporal lobe are stable.  Minimal enhancement about the surgical cavity is unchanged.  Mild white matter changes surrounding the surgical site and left greater than right ventricles are stable.  No acute infarct or hemorrhage is present.  Flow is present in the major intracranial arteries.  The globes and orbits are intact.  The paranasal sinuses and left mastoid air cells are clear.  Minimal fluid is present in the right mastoid air cells.  No obstructing nasopharyngeal lesion is evident.  IMPRESSION:  1.  Continued decrease in size of two lesions in the anterior inferior left frontal lobe compatible with response to therapy. 2.  Stable enhancement along the anterior left sylvian fissure. 3.  New 3 mm nodule enhancement subjacent to the anterior left insular cortex.  This could represent recurrent tumor.  Recommend attention to this area on follow-up examination. 4.  Stable white matter disease.   Original Report  Authenticated By: Marin Roberts, M.D.    Impression:  The patient is clinically doing well.  Her MRI shows a mixed picture at this point.  The sites of disease that we have been tracking are continuing to regress and resolve.  However, there is a new subtle area of enhancement superior and away from a previous sites of tumor involvement, potentially representing recurrent disease versus a benign etiology.  Plan:  Due to the new finding on MRI, the patient will undergo MRI  of the brain in one month in followup thereafter.  _____________________________________  Artist Pais. Kathrynn Running, M.D.

## 2012-09-25 ENCOUNTER — Encounter: Payer: Self-pay | Admitting: Radiation Oncology

## 2012-10-01 ENCOUNTER — Ambulatory Visit
Admission: RE | Admit: 2012-10-01 | Discharge: 2012-10-01 | Disposition: A | Discharge status: Home / Self Care | Payer: Medicare Other | Source: Ambulatory Visit | Attending: Radiation Oncology | Admitting: Radiation Oncology

## 2012-10-01 ENCOUNTER — Other Ambulatory Visit: Payer: Medicare Other

## 2012-10-01 DIAGNOSIS — D496 Neoplasm of unspecified behavior of brain: Secondary | ICD-10-CM

## 2012-10-01 MED ORDER — GADOBENATE DIMEGLUMINE 529 MG/ML IV SOLN
14.0000 mL | Freq: Once | INTRAVENOUS | Status: AC | PRN
  Administered 2012-10-01: 14 mL via INTRAVENOUS

## 2012-10-02 ENCOUNTER — Other Ambulatory Visit: Payer: Medicare Other

## 2012-10-03 ENCOUNTER — Encounter: Payer: Self-pay | Admitting: Radiation Oncology

## 2012-10-03 ENCOUNTER — Telehealth: Payer: Self-pay | Admitting: Radiation Oncology

## 2012-10-03 NOTE — Telephone Encounter (Signed)
Received MyChart inbasket from patient inquiring about MRI results. Phoned patient's home. No answer. Left message explaining that Dr. Kathrynn Running will return to the clinic on Monday.

## 2012-10-03 NOTE — Telephone Encounter (Signed)
Phoned patient's home. No answer. Left message encouraging her to check her MyChart account and call with any future needs.

## 2012-10-03 NOTE — Telephone Encounter (Signed)
I added comment and released result in my chart.

## 2012-10-06 ENCOUNTER — Other Ambulatory Visit: Payer: Self-pay | Admitting: Medical Oncology

## 2012-10-06 DIAGNOSIS — D496 Neoplasm of unspecified behavior of brain: Secondary | ICD-10-CM

## 2012-10-07 ENCOUNTER — Encounter: Payer: Self-pay | Admitting: Adult Health

## 2012-10-07 ENCOUNTER — Ambulatory Visit (HOSPITAL_BASED_OUTPATIENT_CLINIC_OR_DEPARTMENT_OTHER): Payer: Medicare Other | Admitting: Adult Health

## 2012-10-07 ENCOUNTER — Telehealth: Payer: Self-pay | Admitting: Oncology

## 2012-10-07 ENCOUNTER — Other Ambulatory Visit (HOSPITAL_BASED_OUTPATIENT_CLINIC_OR_DEPARTMENT_OTHER): Payer: Medicare Other | Admitting: Lab

## 2012-10-07 VITALS — BP 132/82 | HR 72 | Temp 99.0°F | Resp 20 | Ht 65.0 in | Wt 144.3 lb

## 2012-10-07 DIAGNOSIS — D496 Neoplasm of unspecified behavior of brain: Secondary | ICD-10-CM

## 2012-10-07 DIAGNOSIS — C712 Malignant neoplasm of temporal lobe: Secondary | ICD-10-CM

## 2012-10-07 LAB — CBC WITH DIFFERENTIAL/PLATELET
Basophils Absolute: 0 10*3/uL (ref 0.0–0.1)
Eosinophils Absolute: 0.1 10*3/uL (ref 0.0–0.5)
HGB: 13.8 g/dL (ref 11.6–15.9)
MONO#: 0.3 10*3/uL (ref 0.1–0.9)
NEUT#: 3.3 10*3/uL (ref 1.5–6.5)
RBC: 4.46 10*6/uL (ref 3.70–5.45)
RDW: 14.9 % — ABNORMAL HIGH (ref 11.2–14.5)
WBC: 4.3 10*3/uL (ref 3.9–10.3)

## 2012-10-07 LAB — COMPREHENSIVE METABOLIC PANEL (CC13)
Albumin: 3.6 g/dL (ref 3.5–5.0)
BUN: 14.8 mg/dL (ref 7.0–26.0)
CO2: 26 mEq/L (ref 22–29)
Calcium: 9.8 mg/dL (ref 8.4–10.4)
Chloride: 110 mEq/L — ABNORMAL HIGH (ref 98–109)
Glucose: 82 mg/dl (ref 70–140)
Potassium: 4.3 mEq/L (ref 3.5–5.1)
Sodium: 144 mEq/L (ref 136–145)
Total Protein: 6.4 g/dL (ref 6.4–8.3)

## 2012-10-07 MED ORDER — LEVETIRACETAM 500 MG PO TABS: 500.0000 mg | ORAL_TABLET | Freq: Two times a day (BID) | ORAL | Status: DC

## 2012-10-07 MED ORDER — TEMOZOLOMIDE 20 MG PO CAPS: 20.0000 mg | ORAL_CAPSULE | Freq: Every day | ORAL | Status: DC

## 2012-10-07 MED ORDER — TEMOZOLOMIDE 250 MG PO CAPS: 250.0000 mg | ORAL_CAPSULE | Freq: Every day | ORAL | Status: DC

## 2012-10-07 NOTE — Progress Notes (Signed)
OFFICE PROGRESS NOTE  CC  [name],[name], MD [address] [place] [address] 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  PRIOR THERAPY:  #1 patient originally presented with 2-3 week loss of memory with trouble finding words. She had one episode of left parietal headache. Because of this she was seen by her internist an MRI of the brain was done on 12/13/2011 this showed a ring enhancing lesion in the left temporal lobe measuring 2.7 x 3.6 x 2.5 cm with 45 surrounding satellite lesions. She had a complete systemic workup including CT of the chest abdomen and pelvis that was negative. She went on to be seen by neurosurgery Dr. Luiz Ochoa and patient underwent subtotal resection of the dominant lesion on October 14. 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 She went on to see Dr. Fatima Sanger for 2 to who has been treating her she was also seen by [name].  #2 patient was begun on radiation therapy with concurrent Temodar at 75 mg per meter squared. She completed concurrent Temodar and radiation therapy in December 2013.  #3 patient was seen at [facility] as well as [facility]. [facility] has recommended the patient began adjuvant Temodar at 150 milligrams per meter squared she tolerates the first cycle do we could increase the dose to 200 mg per meter squared days one through 5 on a 28 day cycle.  #4 patient began adjuvant Temodar cycle 1 on 03/13/12 through 03/18/12 initially at 150 mg per meter squared. She tolerated this well. Then the recommendation was to increase the dose initially at a higher dose of 200 mg per meter squared. However she could not tolerate this. So the third cycle was reduced to 150 mg per meter squared days one through 5.  CURRENT THERAPY:patient will begin cycle #6 of Temodar days  1 through 5 , at 150mg  per meter squared, 270mg . Patient will begin this June 10-14.  INTERVAL HISTORY: Kelly Maxwell 67 y.o. female returns today for evaluation prior to her Temodar.  She had her appt at [facility] on August 5 and MRI results showed a slight increase in her left temporal lesion, though it was slow and less than 1 centimeter.  They recommended one more cycle of Temodar and repeat imaging in one month.  Keppra was also recommended due to right foot tingling that is intermittent.  She denies any motor effects of this.  Otherwise, a 10 point ROS is negative.    MEDICAL HISTORY: Past Medical History  Diagnosis Date  . PONV (postoperative nausea and vomiting)   . Depression   . GERD (gastroesophageal reflux disease)   . Headache(784.0)   . HLD (hyperlipidemia)   . Brain tumor 12/18/2011    Left temporal lobe 2.7 x 3.6 x 2.5 cm ring enhancing; numerous small surrounding satellite lesions 12/13/11  . 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 Hiatal hernia   . History of radiation therapy 01/07/2012-02/18/12    left temporal  58GY    ALLERGIES:  is allergic to codeine and compazine.  MEDICATIONS:  Current Outpatient Prescriptions  Medication Sig Dispense Refill  . ALPRAZolam (XANAX) 0.25 MG tablet Take 1-2 tablets (0.25-0.5 mg total) by mouth 3 (three) times daily as needed for sleep (15 minutes prior to radiation daily).  30 tablet  0  . aspirin  EC 81 MG tablet Take 81 mg by mouth at bedtime.      Marland Kitchen buPROPion (WELLBUTRIN XL) 300 MG 24 hr tablet Take 300 mg by mouth every morning.      . Calcium Carbonate-Vitamin D (CALCIUM + D PO) Take 1 tablet by mouth 2 (two) times daily.      . cholecalciferol (VITAMIN D) 1000 UNITS tablet Take 1,000 Units by mouth daily.      . clonazePAM (KLONOPIN) 0.5 MG tablet Take 0.5 mg by mouth at bedtime as needed.      . clotrimazole (MYCELEX) 10 MG troche Take 1 lozenge (10 mg total) by mouth 3 (three) times daily.  21 lozenge  0   . dexamethasone (DECADRON) 4 MG tablet Take 1 mg by mouth daily.       Marland Kitchen estradiol (ESTRACE) 0.5 MG tablet       . HYDROcodone-acetaminophen (NORCO/VICODIN) 5-325 MG per tablet Take 1 tablet by mouth every 4 (four) hours as needed for pain.  51 tablet  0  . medroxyPROGESTERone (PROVERA) 2.5 MG tablet       . Melatonin 3 MG CAPS Take 1 capsule (3 mg total) by mouth at bedtime as needed.    0  . modafinil (PROVIGIL) 100 MG tablet Take 1 tablet (100 mg total) by mouth daily.  30 tablet  5  . Multiple Vitamin (MULTIVITAMIN WITH MINERALS) TABS Take 1 tablet by mouth daily.      Marland Kitchen omeprazole (PRILOSEC) 10 MG capsule Take 10 mg by mouth daily.      . ondansetron (ZOFRAN-ODT) 8 MG disintegrating tablet Take 1 tablet (8 mg total) by mouth every 8 (eight) hours as needed for nausea.  30 tablet  12  . pantoprazole (PROTONIX) 40 MG tablet Take 40 mg by mouth daily.      . promethazine (PHENERGAN) 25 MG suppository Place 1 suppository (25 mg total) rectally every 6 (six) hours as needed for nausea.  20 each  3  . SANCUSO 3.1 MG/24HR       . simvastatin (ZOCOR) 40 MG tablet Take 40 mg by mouth at bedtime.      . sulfamethoxazole-trimethoprim (BACTRIM DS) 800-160 MG per tablet       . temozolomide (TEMODAR) 20 MG capsule Take 1 capsule (20 mg total) by mouth daily. May take on an empty stomach or at bedtime to decrease nausea & vomiting.  5 capsule  0  . temozolomide (TEMODAR) 250 MG capsule Take 1 capsule (250 mg total) by mouth daily. May take on an empty stomach or at bedtime to decrease nausea & vomiting.  5 capsule  0  . tretinoin (RETIN-A) 0.025 % cream Apply 1 application topically daily.      . valACYclovir (VALTREX) 500 MG tablet Take 1 tablet (500 mg total) by mouth daily.  30 tablet  6   No current facility-administered medications for this visit.    SURGICAL HISTORY:  Past Surgical History  Procedure Laterality Date  . Total knee arthroplasty  2000    left  . Bunionectomy  2001    right   . Craniotomy  12/17/2011    Procedure: CRANIOTOMY TUMOR EXCISION;  Surgeon: Clydene Fake, MD;  Location: [facility];  Service: Neurosurgery;  Laterality: Left;  LEFT temporal craniotomy with stealth for tumor resection  . Dilation and curettage of uterus  2007    REVIEW OF SYSTEMS:   General: fatigue (-), night sweats (-), fever (-), pain (-) Lymph: palpable nodes (-)  HEENT: vision changes (-), mucositis (-), gum bleeding (-), epistaxis (-) Cardiovascular: chest pain (-), palpitations (-) Pulmonary: shortness of breath (-), dyspnea on exertion (-), cough (-), hemoptysis (-) GI:  Early satiety (-), melena (-), dysphagia (-), nausea/vomiting (-), diarrhea (-) GU: dysuria (-), hematuria (-), incontinence (-) Musculoskeletal: joint swelling (-), joint pain (-), back pain (-) Neuro: weakness (-), numbness (-), headache (-), confusion (-) Skin: Rash (-), lesions (-), dryness (-) Psych: depression (-), suicidal/homicidal ideation (-), feeling of hopelessness (-)    PHYSICAL EXAMINATION: Blood pressure 132/82, pulse 72, temperature 99 F (37.2 C), temperature source Oral, resp. rate 20, height 5\' 5"  (1.651 m), weight 144 lb 4.8 oz (65.454 kg). Body mass index is 24.01 kg/(m^2). General: Patient is a well appearing female in no acute distress HEENT: PERRLA, sclerae anicteric no conjunctival pallor, MMM Neck: supple, no palpable adenopathy Lungs: clear to auscultation bilaterally, no wheezes, rhonchi, or rales Cardiovascular: regular rate rhythm, S1, S2, no murmurs, rubs or gallops Abdomen: Soft, non-tender, non-distended, normoactive bowel sounds, no HSM Extremities: warm and well perfused, no clubbing, cyanosis, or edema Skin: No rashes or lesions Neuro: CN II-XII intact ECOG PERFORMANCE STATUS: 1 - Symptomatic but completely ambulatory   LABORATORY DATA: Lab Results  Component Value Date   WBC 4.3 10/07/2012   HGB 13.8 10/07/2012   HCT 40.4 10/07/2012   MCV 90.7 10/07/2012   PLT 227  10/07/2012      Chemistry      Component Value Date/Time   NA 144 10/07/2012 1053   NA 138 12/18/2011 0440   K 4.3 10/07/2012 1053   K 4.0 12/18/2011 0440   CL 111* 08/04/2012 1008   CL 107 12/18/2011 0440   CO2 26 10/07/2012 1053   CO2 22 12/18/2011 0440   BUN 14.8 10/07/2012 1053   BUN 17 12/18/2011 0440   CREATININE 0.7 10/07/2012 1053   CREATININE 0.50 12/18/2011 0440   CREATININE 0.50 12/12/2011 0000      Component Value Date/Time   CALCIUM 9.8 10/07/2012 1053   CALCIUM 8.7 12/18/2011 0440   ALKPHOS 100 10/07/2012 1053   AST 21 10/07/2012 1053   ALT 27 10/07/2012 1053   BILITOT 0.62 10/07/2012 1053       RADIOGRAPHIC STUDIES:  No results found.  ASSESSMENT: 67 year old female with  #1 WHO grade 4 glioblastoma multiforme. She is status post partial resection on 12/17/2011. She has completed Temodar and concurrent radiation therapy in December 2013.  #2 on 1/10/ 2014 through 03/18/2012 patient received adjuvant Temodar at 150 mg per meter squared. She has tolerated this dose very well. Because of good tolerance we will plan on increasing her dose to 200 mg per meter squared per [facility] recommendations. Her cycle 2 will begin on 04/17/2012 through 04/21/2012. She gets her Temodar from [facility] and we will order this prior to her next cycle. Her total dose of Temodar for cycle 2 will be 325 mg days one through 5.  #3 patient dose of Temodar was reduced 150 mg per meter squared for a total of 270 mg days one through 5. Due to increased toxicity with the higher dose of 200 mg per meter squared as per [facility] recommendations she was decreased.    PLAN:  #1 proceed with scheduled cycle 8 of day 1 through 5 of Temodar beginning on 10/09/12.    #2patient will be seen back on 11/11/12.    #3 Will prescribe Keppra BID for seizure prophylaxis. I gave her detailed information on the  medication in her AVS.     #4 Patient will return to [facility] on 11/10/12.      All questions were answered. The patient knows to  call the clinic with any problems, questions or concerns. We can certainly see the patient much sooner if necessary.  I spent 25 minutes counseling the patient face to face. The total time spent in the appointment was 30 minutes.  Cherie Ouch Lyn Hollingshead, NP Medical Oncology [facility] Phone: [phone]

## 2012-10-07 NOTE — Patient Instructions (Signed)
Levetiracetam tablets What is this medicine? LEVETIRACETAM (lee ve tye RA se tam) is an antiepileptic drug. It is used with other medicines to treat certain types of seizures. This medicine may be used for other purposes; ask your health care provider or pharmacist if you have questions. What should I tell my health care provider before I take this medicine? They need to know if you have any of these conditions: -kidney disease -suicidal thoughts, plans, or attempt; a previous suicide attempt by you or a family member -an unusual or allergic reaction to levetiracetam, other medicines, foods, dyes, or preservatives -pregnant or trying to get pregnant -breast-feeding How should I use this medicine? Take this medicine by mouth with a glass of water. Follow the directions on the prescription label. Do not cut, crush, or chew this medicine. You may take this medicine with or without food. Take your doses at regular intervals. Do not take your medicine more often than directed. Do not stop taking this medicine or any of your seizure medicines unless instructed by your doctor or health care professional. Stopping your medicine suddenly can increase your seizures or their severity. A special MedGuide will be given to you by the pharmacist with each prescription and refill. Be sure to read this information carefully each time. Contact your pediatrician or health care professional regarding the use of this medication in children. While this drug may be prescribed for children as young as 82 years of age for selected conditions, precautions do apply. Overdosage: If you think you have taken too much of this medicine contact a poison control center or emergency room at once. NOTE: This medicine is only for you. Do not share this medicine with others. What if I miss a dose? If you miss a dose, take it as soon as you can. If it is almost time for your next dose, take only that dose. Do not take double or extra  doses. What may interact with this medicine? -sevelamer This list may not describe all possible interactions. Give your health care provider a list of all the medicines, herbs, non-prescription drugs, or dietary supplements you use. Also tell them if you smoke, drink alcohol, or use illegal drugs. Some items may interact with your medicine. What should I watch for while using this medicine? Visit your doctor or health care professional for a regular check on your progress. Wear a medical identification bracelet or chain to say you have epilepsy, and carry a card that lists all your medications. It is important to take this medicine exactly as instructed by your health care professional. When first starting treatment, your dose may need to be adjusted. It may take weeks or months before your dose is stable. You should contact your doctor or health care professional if your seizures get worse or if you have any new types of seizures. You may get drowsy or dizzy. Do not drive, use machinery, or do anything that needs mental alertness until you know how this medicine affects you. Do not stand or sit up quickly, especially if you are an older patient. This reduces the risk of dizzy or fainting spells. Alcohol may interfere with the effect of this medicine. Avoid alcoholic drinks. The use of this medicine may increase the chance of suicidal thoughts or actions. Pay special attention to how you are responding while on this medicine. Any worsening of mood, or thoughts of suicide or dying should be reported to your health care professional right away. Women who become pregnant  while using this medicine may enroll in the [place] American Antiepileptic Drug Pregnancy Registry by calling [phone]. This registry collects information about the safety of antiepileptic drug use during pregnancy. What side effects may I notice from receiving this medicine? Side effects you should report to your doctor or health care  professional as soon as possible: -allergic reactions like skin rash, itching or hives, swelling of the face, lips, or tongue -breathing problems -dark urine -general ill feeling or flu-like symptoms -problems with balance, talking, walking -unusually weak or tired -worsening of mood, thoughts or actions of suicide or dying -yellowing of the eyes or skin Side effects that usually do not require medical attention (report to your doctor or health care professional if they continue or are bothersome): -diarrhea -dizzy, drowsy -headache -loss of appetite This list may not describe all possible side effects. Call your doctor for medical advice about side effects. You may report side effects to FDA at 1-800-FDA-1088. Where should I keep my medicine? Keep out of reach of children. Store at room temperature between 15 and 30 degrees C (59 and 86 degrees F). Throw away any unused medicine after the expiration date. NOTE: This sheet is a summary. It may not cover all possible information. If you have questions about this medicine, talk to your doctor, pharmacist, or health care provider.  2013, [employer]vier/Gold Standard. (01/03/2011 12:23:20 PM)

## 2012-10-08 ENCOUNTER — Other Ambulatory Visit: Payer: Self-pay

## 2012-10-09 ENCOUNTER — Ambulatory Visit: Payer: Medicare Other | Admitting: Radiation Oncology

## 2012-10-09 ENCOUNTER — Encounter: Payer: Self-pay | Admitting: Radiation Oncology

## 2012-10-09 ENCOUNTER — Ambulatory Visit
Admission: RE | Admit: 2012-10-09 | Discharge: 2012-10-09 | Disposition: A | Discharge status: Home / Self Care | Payer: Medicare Other | Source: Ambulatory Visit | Attending: Radiation Oncology | Admitting: Radiation Oncology

## 2012-10-09 VITALS — BP 144/66 | HR 81 | Temp 98.4°F | Resp 16 | Wt 145.6 lb

## 2012-10-09 DIAGNOSIS — D496 Neoplasm of unspecified behavior of brain: Secondary | ICD-10-CM

## 2012-10-09 NOTE — Progress Notes (Signed)
Radiation Oncology         [phone] ________________________________  Name: Kelly Maxwell MRN: [id number]  Date: 10/09/2012  DOB: [birth year]  Follow-Up Visit Note  CC: [name], MD  Levert Feinstein, MD  Diagnosis:   67 year old woman s/p partial resection of a multifocal left temporal grade IV gliosarcoma s/p radiotherapy to 60 Gy - 01/07/2012-02/18/2012   Interval Since Last Radiation:  8  months  Narrative:  The patient returns today for routine follow-up.  She is without complaint                              ALLERGIES:  is allergic to codeine and compazine.  Meds: Current Outpatient Prescriptions  Medication Sig Dispense Refill  . ALPRAZolam (XANAX) 0.25 MG tablet Take 1-2 tablets (0.25-0.5 mg total) by mouth 3 (three) times daily as needed for sleep (15 minutes prior to radiation daily).  30 tablet  0  . aspirin EC 81 MG tablet Take 81 mg by mouth at bedtime.      Marland Kitchen buPROPion (WELLBUTRIN XL) 300 MG 24 hr tablet Take 300 mg by mouth every morning.      . Calcium Carbonate-Vitamin D (CALCIUM + D PO) Take 1 tablet by mouth 2 (two) times daily.      . cholecalciferol (VITAMIN D) 1000 UNITS tablet Take 1,000 Units by mouth daily.      . clonazePAM (KLONOPIN) 0.5 MG tablet Take 0.5 mg by mouth at bedtime as needed.      . clotrimazole (MYCELEX) 10 MG troche Take 1 lozenge (10 mg total) by mouth 3 (three) times daily.  21 lozenge  0  . estradiol (ESTRACE) 0.5 MG tablet       . levETIRAcetam (KEPPRA) 500 MG tablet Take 1 tablet (500 mg total) by mouth every 12 (twelve) hours.  60 tablet  1  . Melatonin 3 MG CAPS Take 1 capsule (3 mg total) by mouth at bedtime as needed.    0  . modafinil (PROVIGIL) 100 MG tablet Take 1 tablet (100 mg total) by mouth daily.  30 tablet  5  . Multiple Vitamin (MULTIVITAMIN WITH MINERALS) TABS Take 1 tablet by mouth daily.      Marland Kitchen omeprazole (PRILOSEC) 10 MG capsule Take 10 mg by mouth daily.      . pantoprazole (PROTONIX) 40 MG tablet Take 40  mg by mouth daily.      Marland Kitchen SANCUSO 3.1 MG/24HR       . simvastatin (ZOCOR) 40 MG tablet Take 40 mg by mouth at bedtime.      . sulfamethoxazole-trimethoprim (BACTRIM DS) 800-160 MG per tablet       . temozolomide (TEMODAR) 20 MG capsule Take 1 capsule (20 mg total) by mouth daily. May take on an empty stomach or at bedtime to decrease nausea & vomiting.  5 capsule  0  . temozolomide (TEMODAR) 250 MG capsule Take 1 capsule (250 mg total) by mouth daily. May take on an empty stomach or at bedtime to decrease nausea & vomiting.  5 capsule  0  . tretinoin (RETIN-A) 0.025 % cream Apply 1 application topically daily.      . valACYclovir (VALTREX) 500 MG tablet Take 1 tablet (500 mg total) by mouth daily.  30 tablet  6  . ondansetron (ZOFRAN-ODT) 8 MG disintegrating tablet Take 1 tablet (8 mg total) by mouth every 8 (eight) hours as needed for nausea.  30 tablet  12  . promethazine (PHENERGAN) 25 MG suppository Place 1 suppository (25 mg total) rectally every 6 (six) hours as needed for nausea.  20 each  3   No current facility-administered medications for this encounter.    Physical Findings: The patient is in no acute distress. Patient is alert and oriented.  weight is 145 lb 9.6 oz (66.044 kg). Her oral temperature is 98.4 F (36.9 C). Her blood pressure is 144/66 and her pulse is 81. Her respiration is 16 and oxygen saturation is 100%. .  No significant changes.  Lab Findings: Lab Results  Component Value Date   WBC 4.3 10/07/2012   HGB 13.8 10/07/2012   HCT 40.4 10/07/2012   MCV 90.7 10/07/2012   PLT 227 10/07/2012    @LASTCHEM @  Radiographic Findings: Mr Laqueta Jean ZO Contrast  10/01/2012   *RADIOLOGY REPORT*  Clinical Data: Brain tumor. Restaging.  MRI HEAD WITHOUT AND WITH CONTRAST  Technique:  Multiplanar, multiecho pulse sequences of the brain and surrounding structures were obtained according to standard protocol without and with intravenous contrast  Contrast: 14mL MULTIHANCE GADOBENATE  DIMEGLUMINE 529 MG/ML IV SOLN  Comparison: MRI brain 08/28/2012.  Findings: There is continued growth of a lesion in the subcortical anterior left insular cortex, now measuring 4 x 5 x 2 mm.  There is continued decrease and focal enhancement within the anterior left sylvian fissure.  A punctate lesion in the inferior left frontal lobe demonstrates continued decrease in size, now measuring 1.4 mm maximally.  The previously noted area of enhancement adjacent to this is no longer present.  No new lesions or foci of enhancement are evident.  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  No other new foci are present.  Slight asymmetric thickening of the right tentorium is stable.  This likely represents a chronic meningioma.  No acute infarct or hemorrhage is present.  Flow is present in the major intracranial arteries.  Asymmetric white matter changes are evident in the left temporal and frontal lobe compatible with prior treatment in this area. Associated encephalomalacia is within normal limits.  Minimal mucosal thickening is present in the ethmoid air cells. The remaining paranasal cells are clear.  There is some fluid in the right mastoid air cells.  No obstructing nasopharyngeal lesion is evident.  IMPRESSION:  1.  Interval increase in size of a lesion in the subcortical white matter of the anterior left insula, now measuring 5 mm maximally. 2.  Continued decrease in size of a focal lesion in the anterior left frontal lobe and enhancement in the anterior left sylvian fissure. 3.  The adjacent parenchymal lesion in the anterior left frontal lobe is no longer visible. 4.  Stable thickening along the right tentorium likely represents a meningioma.   Original Report Authenticated By: Marin Roberts, M.D.    Impression:  The patient has an isolated area of enhancement that has enlarged over the past month.  It is not clear if this represents recurrence versus radionecrosis.  I reviewed MRI  and previous radiation plan.  The current area of enhancement did receive full dose radiation.  Plan:  Repeat MRI in 1 month.  _____________________________________  Artist Pais. Kathrynn Running, M.D.

## 2012-10-09 NOTE — Progress Notes (Addendum)
Reports mild fatigue but, that she is able to take mid afternoon naps. Reports difficulty sleeping at night but, associates this with afternoon naps. Denies headaches, dizziness, diplopia, floaters, nausea, or vomiting. Continues temodar. Denies taking steroids. Recently prescribed keppra 500 mg bid. Reports intermittent tingling in right foot. Reports occasional raspy voice.

## 2012-10-23 ENCOUNTER — Telehealth: Payer: Self-pay | Admitting: *Deleted

## 2012-10-23 ENCOUNTER — Ambulatory Visit: Payer: Medicare Other | Admitting: Radiation Oncology

## 2012-10-23 NOTE — Telephone Encounter (Signed)
Patient called this morning concerned that "everything is not lining up this month with her visits and Temodar".  Says she is to resume temodar on 11-06-2012 but f/u is 11-11-2012.  MRI has not been scheduled.  F/U at [facility] on 11-10-2012.  Clarified with Augustin Schooling NP and this is correct.  [facility] will order the MRI and patient may need changes to Temodar dose.    Message left for patient with this information and requested a return call if any questions

## 2012-10-23 NOTE — Telephone Encounter (Signed)
CALLED PATIENT TO ASK QUESTION, LVM FOR A RETURN CALL 

## 2012-10-26 ENCOUNTER — Encounter: Payer: Self-pay | Admitting: Adult Health

## 2012-10-27 ENCOUNTER — Telehealth: Payer: Self-pay | Admitting: *Deleted

## 2012-10-27 ENCOUNTER — Other Ambulatory Visit: Payer: Self-pay | Admitting: Radiation Therapy

## 2012-10-27 ENCOUNTER — Telehealth: Payer: Self-pay | Admitting: Oncology

## 2012-10-27 DIAGNOSIS — D496 Neoplasm of unspecified behavior of brain: Secondary | ICD-10-CM

## 2012-10-27 NOTE — Telephone Encounter (Signed)
, °

## 2012-10-27 NOTE — Telephone Encounter (Signed)
RETURNED PATIENT'S PHONE CALL, SPOKE WITH PATIENT'S HUSBAND, [name]

## 2012-10-30 ENCOUNTER — Ambulatory Visit
Admission: RE | Admit: 2012-10-30 | Discharge: 2012-10-30 | Disposition: A | Discharge status: Home / Self Care | Payer: Medicare Other | Source: Ambulatory Visit | Attending: Radiation Oncology | Admitting: Radiation Oncology

## 2012-10-30 DIAGNOSIS — D496 Neoplasm of unspecified behavior of brain: Secondary | ICD-10-CM

## 2012-10-30 MED ORDER — GADOBENATE DIMEGLUMINE 529 MG/ML IV SOLN
13.0000 mL | Freq: Once | INTRAVENOUS | Status: AC | PRN
  Administered 2012-10-30: 13 mL via INTRAVENOUS

## 2012-10-31 ENCOUNTER — Telehealth: Payer: Self-pay | Admitting: Emergency Medicine

## 2012-10-31 ENCOUNTER — Other Ambulatory Visit: Payer: Self-pay | Admitting: Emergency Medicine

## 2012-10-31 DIAGNOSIS — D496 Neoplasm of unspecified behavior of brain: Secondary | ICD-10-CM

## 2012-10-31 MED ORDER — TEMOZOLOMIDE 250 MG PO CAPS: 250.0000 mg | ORAL_CAPSULE | Freq: Every day | ORAL | Status: DC

## 2012-10-31 MED ORDER — TEMOZOLOMIDE 20 MG PO CAPS: 20.0000 mg | ORAL_CAPSULE | Freq: Every day | ORAL | Status: DC

## 2012-10-31 NOTE — Telephone Encounter (Signed)
Temodar prescription faxed to [facility]. Patient aware.

## 2012-11-04 ENCOUNTER — Ambulatory Visit (HOSPITAL_BASED_OUTPATIENT_CLINIC_OR_DEPARTMENT_OTHER): Payer: Medicare Other | Admitting: Oncology

## 2012-11-04 ENCOUNTER — Other Ambulatory Visit (HOSPITAL_BASED_OUTPATIENT_CLINIC_OR_DEPARTMENT_OTHER): Payer: Medicare Other | Admitting: Lab

## 2012-11-04 ENCOUNTER — Telehealth: Payer: Self-pay | Admitting: Oncology

## 2012-11-04 ENCOUNTER — Encounter: Payer: Self-pay | Admitting: Oncology

## 2012-11-04 VITALS — BP 151/80 | HR 86 | Temp 98.6°F | Resp 20 | Ht 65.0 in | Wt 151.4 lb

## 2012-11-04 DIAGNOSIS — D496 Neoplasm of unspecified behavior of brain: Secondary | ICD-10-CM

## 2012-11-04 DIAGNOSIS — C711 Malignant neoplasm of frontal lobe: Secondary | ICD-10-CM

## 2012-11-04 DIAGNOSIS — C712 Malignant neoplasm of temporal lobe: Secondary | ICD-10-CM

## 2012-11-04 LAB — COMPREHENSIVE METABOLIC PANEL (CC13)
Alkaline Phosphatase: 87 U/L (ref 40–150)
Creatinine: 0.6 mg/dL (ref 0.6–1.1)
Glucose: 88 mg/dl (ref 70–140)
Sodium: 146 mEq/L — ABNORMAL HIGH (ref 136–145)
Total Bilirubin: 0.67 mg/dL (ref 0.20–1.20)
Total Protein: 6.3 g/dL — ABNORMAL LOW (ref 6.4–8.3)

## 2012-11-04 LAB — CBC WITH DIFFERENTIAL/PLATELET
BASO%: 0.9 % (ref 0.0–2.0)
Eosinophils Absolute: 0.1 10*3/uL (ref 0.0–0.5)
HCT: 38.2 % (ref 34.8–46.6)
LYMPH%: 13.8 % — ABNORMAL LOW (ref 14.0–49.7)
MCHC: 34.2 g/dL (ref 31.5–36.0)
MCV: 90.6 fL (ref 79.5–101.0)
MONO%: 5 % (ref 0.0–14.0)
NEUT%: 78.1 % — ABNORMAL HIGH (ref 38.4–76.8)
Platelets: 236 10*3/uL (ref 145–400)
RBC: 4.22 10*6/uL (ref 3.70–5.45)

## 2012-11-04 NOTE — Progress Notes (Signed)
OFFICE PROGRESS NOTE  CC  [name],[name], MD [address] [name] [id number] 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  PRIOR THERAPY:  #1 patient originally presented with 2-3 week loss of memory with trouble finding words. She had one episode of left parietal headache. Because of this she was seen by her internist an MRI of the brain was done on 12/13/2011 this showed a ring enhancing lesion in the left temporal lobe measuring 2.7 x 3.6 x 2.5 cm with 45 surrounding satellite lesions. She had a complete systemic workup including CT of the chest abdomen and pelvis that was negative. She went on to be seen by neurosurgery Dr. Luiz Ochoa and patient underwent subtotal resection of the dominant lesion on October 14. 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 She went on to see Dr. Fatima Sanger for 2 to who has been treating her she was also seen by [name].  #2 patient was begun on radiation therapy with concurrent Temodar at 75 mg per meter squared. She completed concurrent Temodar and radiation therapy in December 2013.  #3 patient was seen at [facility] as well as [facility]. [facility] has recommended the patient began adjuvant Temodar at 150 milligrams per meter squared she tolerates the first cycle do we could increase the dose to 200 mg per meter squared days one through 5 on a 28 day cycle.  #4 patient began adjuvant Temodar cycle 1 on 03/13/12 through 03/18/12 initially at 150 mg per meter squared. She tolerated this well. Then the recommendation was to increase the dose initially at a higher dose of 200 mg per meter squared. However she could not tolerate this. So the third cycle was reduced to 150 mg per meter squared days one through 5.  CURRENT THERAPY:patient will begin cycle #7 of Temodar days  1 through 5 , at 150mg  per meter squared, 270mg .   INTERVAL HISTORY: Kelly Maxwell 67 y.o. female returns today for evaluation prior to her Temodar.  She had her appt at [facility] on August 5 and MRI results showed a slight increase in her left temporal lesion, though it was slow and less than 1 centimeter.  They recommended one more cycle of Temodar and repeat imaging in one month.  Keppra was also recommended due to right foot tingling that is intermittent.  She denies any motor effects of this.  Otherwise, a 10 point ROS is negative.    MEDICAL HISTORY: Past Medical History  Diagnosis Date  . PONV (postoperative nausea and vomiting)   . Depression   . GERD (gastroesophageal reflux disease)   . Headache(784.0)   . HLD (hyperlipidemia)   . Brain tumor 12/18/2011    Left temporal lobe 2.7 x 3.6 x 2.5 cm ring enhancing; numerous small surrounding satellite lesions 12/13/11  . 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 Hiatal hernia   . History of radiation therapy 01/07/2012-02/18/12    left temporal  58GY    ALLERGIES:  is allergic to codeine and compazine.  MEDICATIONS:  Current Outpatient Prescriptions  Medication Sig Dispense Refill  . ALPRAZolam (XANAX) 0.25 MG tablet Take 1-2 tablets (0.25-0.5 mg total) by mouth 3 (three) times daily as needed for sleep (15 minutes prior to radiation daily).  30 tablet  0  . aspirin EC 81 MG tablet Take  81 mg by mouth at bedtime.      Marland Kitchen buPROPion (WELLBUTRIN XL) 300 MG 24 hr tablet Take 300 mg by mouth every morning.      . Calcium Carbonate-Vitamin D (CALCIUM + D PO) Take 1 tablet by mouth 2 (two) times daily.      . cholecalciferol (VITAMIN D) 1000 UNITS tablet Take 1,000 Units by mouth daily.      . clonazePAM (KLONOPIN) 0.5 MG tablet Take 0.5 mg by mouth at bedtime as needed.      . clotrimazole (MYCELEX) 10 MG troche Take 1 lozenge (10 mg total) by mouth 3 (three) times daily.  21 lozenge  0  . Melatonin 3 MG CAPS Take 1  capsule (3 mg total) by mouth at bedtime as needed.    0  . Multiple Vitamin (MULTIVITAMIN WITH MINERALS) TABS Take 1 tablet by mouth daily.      Marland Kitchen omeprazole (PRILOSEC) 10 MG capsule Take 10 mg by mouth daily.      . ondansetron (ZOFRAN-ODT) 8 MG disintegrating tablet Take 1 tablet (8 mg total) by mouth every 8 (eight) hours as needed for nausea.  30 tablet  12  . pantoprazole (PROTONIX) 40 MG tablet Take 40 mg by mouth daily.      . simvastatin (ZOCOR) 40 MG tablet Take 40 mg by mouth at bedtime.      . sulfamethoxazole-trimethoprim (BACTRIM DS) 800-160 MG per tablet       . temozolomide (TEMODAR) 20 MG capsule Take 1 capsule (20 mg total) by mouth daily. May take on an empty stomach or at bedtime to decrease nausea & vomiting.  5 capsule  0  . temozolomide (TEMODAR) 250 MG capsule Take 1 capsule (250 mg total) by mouth daily. May take on an empty stomach or at bedtime to decrease nausea & vomiting.  5 capsule  0  . tretinoin (RETIN-A) 0.025 % cream Apply 1 application topically daily.      . valACYclovir (VALTREX) 500 MG tablet Take 1 tablet (500 mg total) by mouth daily.  30 tablet  6  . estradiol (ESTRACE) 0.5 MG tablet       . levETIRAcetam (KEPPRA) 500 MG tablet Take 1 tablet (500 mg total) by mouth every 12 (twelve) hours.  60 tablet  1  . modafinil (PROVIGIL) 100 MG tablet Take 1 tablet (100 mg total) by mouth daily.  30 tablet  5  . promethazine (PHENERGAN) 25 MG suppository Place 1 suppository (25 mg total) rectally every 6 (six) hours as needed for nausea.  20 each  3  . SANCUSO 3.1 MG/24HR        No current facility-administered medications for this visit.    SURGICAL HISTORY:  Past Surgical History  Procedure Laterality Date  . Total knee arthroplasty  2000    left  . Bunionectomy  2001    right  . Craniotomy  12/17/2011    Procedure: CRANIOTOMY TUMOR EXCISION;  Surgeon: Clydene Fake, MD;  Location: [facility];  Service: Neurosurgery;  Laterality: Left;  LEFT temporal  craniotomy with stealth for tumor resection  . Dilation and curettage of uterus  2007    REVIEW OF SYSTEMS:   General: fatigue (-), night sweats (-), fever (-), pain (-) Lymph: palpable nodes (-) HEENT: vision changes (-), mucositis (-), gum bleeding (-), epistaxis (-) Cardiovascular: chest pain (-), palpitations (-) Pulmonary: shortness of breath (-), dyspnea on exertion (-), cough (-), hemoptysis (-) GI:  Early satiety (-), melena (-),  dysphagia (-), nausea/vomiting (-), diarrhea (-) GU: dysuria (-), hematuria (-), incontinence (-) Musculoskeletal: joint swelling (-), joint pain (-), back pain (-) Neuro: weakness (-), numbness (-), headache (-), confusion (-) Skin: Rash (-), lesions (-), dryness (-) Psych: depression (-), suicidal/homicidal ideation (-), feeling of hopelessness (-)    PHYSICAL EXAMINATION: Blood pressure 151/80, pulse 86, temperature 98.6 F (37 C), temperature source Oral, resp. rate 20, height 5\' 5"  (1.651 m), weight 151 lb 6.4 oz (68.675 kg). Body mass index is 25.19 kg/(m^2). General: Patient is a well appearing female in no acute distress HEENT: PERRLA, sclerae anicteric no conjunctival pallor, MMM Neck: supple, no palpable adenopathy Lungs: clear to auscultation bilaterally, no wheezes, rhonchi, or rales Cardiovascular: regular rate rhythm, S1, S2, no murmurs, rubs or gallops Abdomen: Soft, non-tender, non-distended, normoactive bowel sounds, no HSM Extremities: warm and well perfused, no clubbing, cyanosis, or edema Skin: No rashes or lesions Neuro: CN II-XII intact ECOG PERFORMANCE STATUS: 1 - Symptomatic but completely ambulatory   LABORATORY DATA: Lab Results  Component Value Date   WBC 4.9 11/04/2012   HGB 13.1 11/04/2012   HCT 38.2 11/04/2012   MCV 90.6 11/04/2012   PLT 236 11/04/2012      Chemistry      Component Value Date/Time   NA 146* 11/04/2012 1451   NA 138 12/18/2011 0440   K 4.0 11/04/2012 1451   K 4.0 12/18/2011 0440   CL 111* 08/04/2012 1008    CL 107 12/18/2011 0440   CO2 26 11/04/2012 1451   CO2 22 12/18/2011 0440   BUN 14.6 11/04/2012 1451   BUN 17 12/18/2011 0440   CREATININE 0.6 11/04/2012 1451   CREATININE 0.50 12/18/2011 0440   CREATININE 0.50 12/12/2011 0000      Component Value Date/Time   CALCIUM 9.6 11/04/2012 1451   CALCIUM 8.7 12/18/2011 0440   ALKPHOS 87 11/04/2012 1451   AST 24 11/04/2012 1451   ALT 35 11/04/2012 1451   BILITOT 0.67 11/04/2012 1451       RADIOGRAPHIC STUDIES:  No results found.  ASSESSMENT: 67 year old female with  #1 WHO grade 4 glioblastoma multiforme. She is status post partial resection on 12/17/2011. She has completed Temodar and concurrent radiation therapy in December 2013.  #2 on 1/10/ 2014 through 03/18/2012 patient received adjuvant Temodar at 150 mg per meter squared. She has tolerated this dose very well. Because of good tolerance we will plan on increasing her dose to 200 mg per meter squared per [facility] recommendations. Her cycle 2 will begin on 04/17/2012 through 04/21/2012. She gets her Temodar from [facility] and we will order this prior to her next cycle. Her total dose of Temodar for cycle 2 will be 325 mg days one through 5.  #3 patient dose of Temodar was reduced 150 mg per meter squared for a total of 270 mg days one through 5. Due to increased toxicity with the higher dose of 200 mg per meter squared as per [facility] recommendations she was decreased.    PLAN:  #1 proceed with scheduled cycle 9 of day 1 through 5 of Temodar beginning on 11/06/12 - 11/10/12    #2patient will be seen back on 12/02/12   #3 Will prescribe Keppra BID for seizure prophylaxis. I gave her detailed information on the medication in her AVS.     #4 Patient will return to [facility] on 11/10/12.      All questions were answered. The patient knows to call the clinic with any problems, questions  or concerns. We can certainly see the patient much sooner if necessary.  I spent 25 minutes counseling the patient face to face. The  total time spent in the appointment was 30 minutes.  Drue Second, MD Medical/Oncology [facility] [phone] (beeper) [phone] (Office)  11/04/2012, 4:10 PM

## 2012-11-04 NOTE — Patient Instructions (Addendum)
Doing well   Continue with cycle 9 of temodar days 1 -5 on 9/4 - 9/8  I will see you back on 9/30

## 2012-11-04 NOTE — Telephone Encounter (Signed)
, °

## 2012-11-11 ENCOUNTER — Other Ambulatory Visit: Payer: Medicare Other | Admitting: Lab

## 2012-11-11 ENCOUNTER — Ambulatory Visit: Payer: Medicare Other | Admitting: Oncology

## 2012-11-13 ENCOUNTER — Encounter: Payer: Self-pay | Admitting: Radiation Oncology

## 2012-11-13 ENCOUNTER — Ambulatory Visit
Admission: RE | Admit: 2012-11-13 | Discharge: 2012-11-13 | Disposition: A | Discharge status: Home / Self Care | Payer: Medicare Other | Source: Ambulatory Visit | Attending: Radiation Oncology | Admitting: Radiation Oncology

## 2012-11-13 VITALS — BP 131/54 | HR 83 | Temp 98.6°F | Resp 16 | Wt 146.1 lb

## 2012-11-13 DIAGNOSIS — D496 Neoplasm of unspecified behavior of brain: Secondary | ICD-10-CM

## 2012-11-13 NOTE — Progress Notes (Signed)
Seen at [facility] on Monday and obtained a positive report. Also, saw Welton Flakes on Tuesday (see printed note). Denies pain at this time. Denies headaches. Steady gait noted. Denies dizziness. Denies nausea or vomiting but, takes an antiemetic each morning. Taking temodar. Reports on days 3,4, 5 she feels fatigued. Reports intermittent night sweats. Denies confusion. No expressive aphagia noted.

## 2012-11-13 NOTE — Progress Notes (Signed)
Radiation Oncology         [phone]) [phone] ________________________________  Name: Kelly Maxwell MRN: [id number]  Date: 11/13/2012  DOB: [birth year]  Follow-Up Visit Note  CC: [name], MD  [name], MD  Diagnosis:   67 year old woman s/p partial resection of a multifocal left temporal grade IV gliosarcoma s/p radiotherapy to 60 Gy - 01/07/2012-02/18/2012   Interval Since Last Radiation:  9  months  Narrative:  The patient returns today for routine follow-up.  Seen at [facility] on Monday and obtained a positive report. Also, saw Welton Flakes on Tuesday (see note). Denies pain at this time. Denies headaches. Denies dizziness. Denies nausea or vomiting but, takes an antiemetic each morning. Taking temodar. Reports on days 3,4, 5 she feels fatigued. Reports intermittent night sweats. Denies confusion.                               ALLERGIES:  is allergic to codeine and compazine.  Meds: Current Outpatient Prescriptions  Medication Sig Dispense Refill  . aspirin EC 81 MG tablet Take 81 mg by mouth at bedtime.      Marland Kitchen buPROPion (WELLBUTRIN XL) 300 MG 24 hr tablet Take 300 mg by mouth every morning.      . Calcium Carbonate-Vitamin D (CALCIUM + D PO) Take 1 tablet by mouth 2 (two) times daily.      . cholecalciferol (VITAMIN D) 1000 UNITS tablet Take 1,000 Units by mouth daily.      . Cholecalciferol (VITAMIN D-1000 MAX ST) 1000 UNITS tablet Take by mouth. Take 1,000 Units by mouth daily.      . clonazePAM (KLONOPIN) 0.5 MG tablet Take 0.5 mg by mouth at bedtime as needed.      . Melatonin 3 MG CAPS Take 1 capsule (3 mg total) by mouth at bedtime as needed.    0  . Multiple Vitamin (MULTIVITAMIN WITH MINERALS) TABS Take 1 tablet by mouth daily.      . ondansetron (ZOFRAN-ODT) 8 MG disintegrating tablet Take 1 tablet (8 mg total) by mouth every 8 (eight) hours as needed for nausea.  30 tablet  12  . pantoprazole (PROTONIX) 40 MG tablet Take 40 mg by mouth daily.      . promethazine (PHENERGAN) 25  MG suppository Place 1 suppository (25 mg total) rectally every 6 (six) hours as needed for nausea.  20 each  3  . SANCUSO 3.1 MG/24HR       . simvastatin (ZOCOR) 40 MG tablet Take 40 mg by mouth at bedtime.      . sulfamethoxazole-trimethoprim (BACTRIM DS) 800-160 MG per tablet       . temozolomide (TEMODAR) 20 MG capsule Take 1 capsule (20 mg total) by mouth daily. May take on an empty stomach or at bedtime to decrease nausea & vomiting.  5 capsule  0  . temozolomide (TEMODAR) 20 MG capsule Take by mouth. Take by mouth.      . temozolomide (TEMODAR) 250 MG capsule Take 1 capsule (250 mg total) by mouth daily. May take on an empty stomach or at bedtime to decrease nausea & vomiting.  5 capsule  0  . temozolomide (TEMODAR) 250 MG capsule Take by mouth. Take by mouth.      . tretinoin (RETIN-A) 0.025 % cream Apply 1 application topically daily.      Marland Kitchen levETIRAcetam (KEPPRA) 500 MG tablet Take 1 tablet (500 mg total) by mouth every 12 (twelve) hours.  60 tablet  1   No current facility-administered medications for this encounter.    Physical Findings: The patient is in no acute distress. Patient is alert and oriented.  weight is 146 lb 1.6 oz (66.271 kg). Her oral temperature is 98.6 F (37 C). Her blood pressure is 131/54 and her pulse is 83. Her respiration is 16 and oxygen saturation is 100%. . Steady gait noted.  No expressive aphagia noted No significant changes.  Lab Findings: Lab Results  Component Value Date   WBC 4.9 11/04/2012   HGB 13.1 11/04/2012   HCT 38.2 11/04/2012   MCV 90.6 11/04/2012   PLT 236 11/04/2012    Radiographic Findings: Mr Laqueta Jean ZO Contrast  10/30/2012   *RADIOLOGY REPORT*  Clinical Data: Multifocal grade IV gliosarcoma.  23-month S R S follow-up, restaging.  MRI HEAD WITHOUT AND WITH CONTRAST  Technique:  Multiplanar, multiecho pulse sequences of the brain and surrounding structures were obtained according to standard protocol without and with intravenous contrast   Contrast: 13mL MULTIHANCE GADOBENATE DIMEGLUMINE 529 MG/ML IV SOLN  Comparison: Multiple priors, most recent 10/01/2012  Findings: Unchanged small focus of enhancement in the subcortical white matter of the left insula still measuring 4 x 5 x 2 mm. Continued improvement in the fairly unimpressive enhancement in the anterior sylvian fissure on the left. Continued improvement or resolution in the other small foci of enhancement in the inferior frontal lobe on the left seen on prior scans from earlier in 2014. No other focal nodules of enhancement are identified.  No new enhancing lesions are seen.  Stable venous angioma left anterior frontal lobe.  Small area of enhancement right inferior tentorium adjacent to the vermis is stable, likely representing a sessile meningioma.  No acute stroke or hemorrhage.  No hydrocephalus or extra-axial fluid.  No areas of restricted diffusion.  No foci of chronic hemorrhage.  Stable gliosis/encephalomalacia left frontotemporal region representing post treatment changes.  IMPRESSION: Unchanged small focus of enhancement left insula. No evidence for disease progression.   Original Report Authenticated By: Davonna Belling, M.D.    Impression:  The patient has no evidence of active recurrence on MRI.  She does have a stable 5 mm focus of enhancement potentially representing recurrent disease versus radionecrosis.  Plan:  MRI at end of October then follow-up.  _____________________________________  Artist Pais. Kathrynn Running, M.D.

## 2012-11-18 ENCOUNTER — Other Ambulatory Visit: Payer: Self-pay | Admitting: Radiation Therapy

## 2012-11-18 DIAGNOSIS — D496 Neoplasm of unspecified behavior of brain: Secondary | ICD-10-CM

## 2012-11-21 ENCOUNTER — Other Ambulatory Visit: Payer: Self-pay

## 2012-11-21 DIAGNOSIS — Z1231 Encounter for screening mammogram for malignant neoplasm of breast: Secondary | ICD-10-CM

## 2012-12-02 ENCOUNTER — Ambulatory Visit (HOSPITAL_BASED_OUTPATIENT_CLINIC_OR_DEPARTMENT_OTHER): Payer: Medicare Other | Admitting: Adult Health

## 2012-12-02 ENCOUNTER — Other Ambulatory Visit (HOSPITAL_BASED_OUTPATIENT_CLINIC_OR_DEPARTMENT_OTHER): Payer: Medicare Other | Admitting: Lab

## 2012-12-02 VITALS — BP 119/75 | HR 77 | Temp 98.2°F | Resp 18 | Ht 65.0 in | Wt 145.3 lb

## 2012-12-02 DIAGNOSIS — C712 Malignant neoplasm of temporal lobe: Secondary | ICD-10-CM

## 2012-12-02 DIAGNOSIS — D496 Neoplasm of unspecified behavior of brain: Secondary | ICD-10-CM

## 2012-12-02 DIAGNOSIS — C711 Malignant neoplasm of frontal lobe: Secondary | ICD-10-CM

## 2012-12-02 LAB — CBC WITH DIFFERENTIAL/PLATELET
BASO%: 0.7 % (ref 0.0–2.0)
Basophils Absolute: 0 10*3/uL (ref 0.0–0.1)
EOS%: 1.8 % (ref 0.0–7.0)
HGB: 13.6 g/dL (ref 11.6–15.9)
MCH: 31.3 pg (ref 25.1–34.0)
MCHC: 33.9 g/dL (ref 31.5–36.0)
MCV: 92.2 fL (ref 79.5–101.0)
MONO%: 5.2 % (ref 0.0–14.0)
NEUT%: 79.8 % — ABNORMAL HIGH (ref 38.4–76.8)
RDW: 14.2 % (ref 11.2–14.5)

## 2012-12-02 LAB — COMPREHENSIVE METABOLIC PANEL (CC13)
AST: 22 U/L (ref 5–34)
Alkaline Phosphatase: 100 U/L (ref 40–150)
BUN: 15.6 mg/dL (ref 7.0–26.0)
Creatinine: 0.7 mg/dL (ref 0.6–1.1)
Potassium: 4.1 mEq/L (ref 3.5–5.1)

## 2012-12-02 MED ORDER — TEMOZOLOMIDE 20 MG PO CAPS: 20.0000 mg | ORAL_CAPSULE | Freq: Every day | ORAL | Status: DC

## 2012-12-02 MED ORDER — TEMOZOLOMIDE 250 MG PO CAPS: 250.0000 mg | ORAL_CAPSULE | Freq: Every day | ORAL | Status: DC

## 2012-12-02 NOTE — Progress Notes (Signed)
OFFICE PROGRESS NOTE  CC  [name],[name], MD [address] [place] [address] 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  PRIOR THERAPY:  #1 patient originally presented with 2-3 week loss of memory with trouble finding words. She had one episode of left parietal headache. Because of this she was seen by her internist an MRI of the brain was done on 12/13/2011 this showed a ring enhancing lesion in the left temporal lobe measuring 2.7 x 3.6 x 2.5 cm with 45 surrounding satellite lesions. She had a complete systemic workup including CT of the chest abdomen and pelvis that was negative. She went on to be seen by neurosurgery Dr. Luiz Ochoa and patient underwent subtotal resection of the dominant lesion on October 14. 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 She went on to see Dr. Fatima Sanger for 2 to who has been treating her she was also seen by [name].  #2 patient was begun on radiation therapy with concurrent Temodar at 75 mg per meter squared. She completed concurrent Temodar and radiation therapy in December 2013.  #3 patient was seen at [facility] as well as [facility]. [facility] has recommended the patient began adjuvant Temodar at 150 milligrams per meter squared she tolerates the first cycle do we could increase the dose to 200 mg per meter squared days one through 5 on a 28 day cycle.  #4 patient began adjuvant Temodar cycle 1 on 03/13/12 through 03/18/12 initially at 150 mg per meter squared. She tolerated this well. Then the recommendation was to increase the dose initially at a higher dose of 200 mg per meter squared. However she could not tolerate this. So the third cycle was reduced to 150 mg per meter squared days one through 5.  CURRENT THERAPY:patient will begin cycle #10 of Temodar  days 1 through 5 , at 150mg  per meter squared, 270mg .   INTERVAL HISTORY: Kelly Maxwell 67 y.o. female returns today for evaluation prior to her Temodar.  She had her appt at [facility] on September 9, and will follow up with them again prior to cycle 11.  She is doing well and tolerating the temodar without any difficulty.  She had stopped the keppra due to drowsiness.  But otherwise, a 10 point ROS is neg.  MEDICAL HISTORY: Past Medical History  Diagnosis Date  . PONV (postoperative nausea and vomiting)   . Depression   . GERD (gastroesophageal reflux disease)   . Headache(784.0)   . HLD (hyperlipidemia)   . Brain tumor 12/18/2011    Left temporal lobe 2.7 x 3.6 x 2.5 cm ring enhancing; numerous small surrounding satellite lesions 12/13/11  . 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 Hiatal hernia   . History of radiation therapy 01/07/2012-02/18/12    left temporal  58GY    ALLERGIES:  is allergic to codeine and compazine.  MEDICATIONS:  Current Outpatient Prescriptions  Medication Sig Dispense Refill  . aspirin EC 81 MG tablet Take 81 mg by mouth at bedtime.      Marland Kitchen buPROPion (WELLBUTRIN XL) 300 MG 24 hr tablet Take 300 mg by mouth every morning.      . Calcium Carbonate-Vitamin D (CALCIUM + D PO) Take 1 tablet by mouth 2 (two) times daily.      . cholecalciferol (VITAMIN D) 1000  UNITS tablet Take 1,000 Units by mouth daily.      . Cholecalciferol (VITAMIN D-1000 MAX ST) 1000 UNITS tablet Take by mouth. Take 1,000 Units by mouth daily.      . clonazePAM (KLONOPIN) 0.5 MG tablet Take 0.5 mg by mouth at bedtime as needed.      . levETIRAcetam (KEPPRA) 500 MG tablet Take 1 tablet (500 mg total) by mouth every 12 (twelve) hours.  60 tablet  1  . Melatonin 3 MG CAPS Take 1 capsule (3 mg total) by mouth at bedtime as needed.    0  . Multiple Vitamin (MULTIVITAMIN WITH MINERALS) TABS Take 1 tablet by mouth daily.      . ondansetron (ZOFRAN-ODT) 8 MG disintegrating tablet  Take 1 tablet (8 mg total) by mouth every 8 (eight) hours as needed for nausea.  30 tablet  12  . pantoprazole (PROTONIX) 40 MG tablet Take 40 mg by mouth daily.      . promethazine (PHENERGAN) 25 MG suppository Place 1 suppository (25 mg total) rectally every 6 (six) hours as needed for nausea.  20 each  3  . SANCUSO 3.1 MG/24HR       . simvastatin (ZOCOR) 40 MG tablet Take 40 mg by mouth at bedtime.      . sulfamethoxazole-trimethoprim (BACTRIM DS) 800-160 MG per tablet       . temozolomide (TEMODAR) 20 MG capsule Take 1 capsule (20 mg total) by mouth daily. May take on an empty stomach or at bedtime to decrease nausea & vomiting.  5 capsule  0  . temozolomide (TEMODAR) 250 MG capsule Take 1 capsule (250 mg total) by mouth daily. May take on an empty stomach or at bedtime to decrease nausea & vomiting.  5 capsule  0  . tretinoin (RETIN-A) 0.025 % cream Apply 1 application topically daily.       No current facility-administered medications for this visit.    SURGICAL HISTORY:  Past Surgical History  Procedure Laterality Date  . Total knee arthroplasty  2000    left  . Bunionectomy  2001    right  . Craniotomy  12/17/2011    Procedure: CRANIOTOMY TUMOR EXCISION;  Surgeon: Otilio Connors, MD;  Location: [facility];  Service: Neurosurgery;  Laterality: Left;  LEFT temporal craniotomy with stealth for tumor resection  . Dilation and curettage of uterus  2007    REVIEW OF SYSTEMS:   A 10 point review of systems was conducted and is otherwise negative except for what is noted above.    PHYSICAL EXAMINATION: Blood pressure 119/75, pulse 77, temperature 98.2 F (36.8 C), temperature source Oral, resp. rate 18, height 5\' 5"  (1.651 m), weight 145 lb 4.8 oz (65.908 kg). Body mass index is 24.18 kg/(m^2). General: Patient is a well appearing female in no acute distress HEENT: PERRLA, sclerae anicteric no conjunctival pallor, MMM Neck: supple, no palpable adenopathy Lungs: clear to  auscultation bilaterally, no wheezes, rhonchi, or rales Cardiovascular: regular rate rhythm, S1, S2, no murmurs, rubs or gallops Abdomen: Soft, non-tender, non-distended, normoactive bowel sounds, no HSM Extremities: warm and well perfused, no clubbing, cyanosis, or edema Skin: No rashes or lesions Neuro: CN II-XII intact ECOG PERFORMANCE STATUS: 1 - Symptomatic but completely ambulatory   LABORATORY DATA: Lab Results  Component Value Date   WBC 5.5 12/02/2012   HGB 13.6 12/02/2012   HCT 40.1 12/02/2012   MCV 92.2 12/02/2012   PLT 255 12/02/2012      Chemistry  Component Value Date/Time   NA 142 12/02/2012 1450   NA 138 12/18/2011 0440   K 4.1 12/02/2012 1450   K 4.0 12/18/2011 0440   CL 111* 08/04/2012 1008   CL 107 12/18/2011 0440   CO2 26 12/02/2012 1450   CO2 22 12/18/2011 0440   BUN 15.6 12/02/2012 1450   BUN 17 12/18/2011 0440   CREATININE 0.7 12/02/2012 1450   CREATININE 0.50 12/18/2011 0440   CREATININE 0.50 12/12/2011 0000      Component Value Date/Time   CALCIUM 9.4 12/02/2012 1450   CALCIUM 8.7 12/18/2011 0440   ALKPHOS 100 12/02/2012 1450   AST 22 12/02/2012 1450   ALT 25 12/02/2012 1450   BILITOT 0.50 12/02/2012 1450       RADIOGRAPHIC STUDIES:  No results found.  ASSESSMENT: 67 year old female with  #1 WHO grade 4 glioblastoma multiforme. She is status post partial resection on 12/17/2011. She has completed Temodar and concurrent radiation therapy in December 2013.  #2 on 1/10/ 2014 through 03/18/2012 patient received adjuvant Temodar at 150 mg per meter squared. She has tolerated this dose very well. Because of good tolerance the patient was dose increased 200 mg per meter squared per [facility] recommendations.  However she had difficulty taking this cycle and her dose was subsequently reduced to 150 mg per meter squared for a total of 270 mg days one through 5.  Her tumor continues to be stable.    PLAN:  #1  Patient will proceed with scheduled cycle 10 of day 1  through 5 of Temodar beginning on 12/04/12 - 12/08/12.    #2 Patient will return in 4 weeks for her next appointment and evaluation.    #3 I talked to the patient again about Keppra today and that the drowsiness does go away after time.  She will attempt to restart it.       All questions were answered. The patient knows to call the clinic with any problems, questions or concerns. We can certainly see the patient much sooner if necessary.  I spent 25 minutes counseling the patient face to face. The total time spent in the appointment was 30 minutes.  Suzan Garibaldi [place], [place] Phone: [phone]    12/03/2012, 11:00 AM

## 2012-12-03 ENCOUNTER — Encounter: Payer: Self-pay | Admitting: Adult Health

## 2012-12-03 ENCOUNTER — Telehealth: Payer: Self-pay | Admitting: Oncology

## 2012-12-03 NOTE — Telephone Encounter (Signed)
, °

## 2012-12-09 ENCOUNTER — Ambulatory Visit
Admission: RE | Admit: 2012-12-09 | Discharge: 2012-12-09 | Disposition: A | Discharge status: Home / Self Care | Payer: Medicare Other | Source: Ambulatory Visit

## 2012-12-09 DIAGNOSIS — Z1231 Encounter for screening mammogram for malignant neoplasm of breast: Secondary | ICD-10-CM

## 2012-12-30 ENCOUNTER — Ambulatory Visit (HOSPITAL_BASED_OUTPATIENT_CLINIC_OR_DEPARTMENT_OTHER): Payer: Medicare Other | Admitting: Adult Health

## 2012-12-30 ENCOUNTER — Ambulatory Visit
Admission: RE | Admit: 2012-12-30 | Discharge: 2012-12-30 | Disposition: A | Discharge status: Home / Self Care | Payer: Medicare Other | Source: Ambulatory Visit | Attending: Radiation Oncology | Admitting: Radiation Oncology

## 2012-12-30 ENCOUNTER — Encounter: Payer: Self-pay | Admitting: Adult Health

## 2012-12-30 ENCOUNTER — Encounter: Payer: Self-pay | Admitting: Radiation Oncology

## 2012-12-30 ENCOUNTER — Other Ambulatory Visit (HOSPITAL_BASED_OUTPATIENT_CLINIC_OR_DEPARTMENT_OTHER): Payer: Medicare Other | Admitting: Lab

## 2012-12-30 ENCOUNTER — Telehealth: Payer: Self-pay | Admitting: Oncology

## 2012-12-30 VITALS — BP 109/70 | HR 73 | Temp 98.2°F | Resp 18 | Ht 65.0 in | Wt 144.5 lb

## 2012-12-30 DIAGNOSIS — C712 Malignant neoplasm of temporal lobe: Secondary | ICD-10-CM

## 2012-12-30 DIAGNOSIS — D496 Neoplasm of unspecified behavior of brain: Secondary | ICD-10-CM

## 2012-12-30 LAB — CBC WITH DIFFERENTIAL/PLATELET
BASO%: 0.6 % (ref 0.0–2.0)
EOS%: 2.1 % (ref 0.0–7.0)
Eosinophils Absolute: 0.1 10*3/uL (ref 0.0–0.5)
HCT: 40.3 % (ref 34.8–46.6)
HGB: 13.4 g/dL (ref 11.6–15.9)
MCH: 30.7 pg (ref 25.1–34.0)
MCHC: 33.3 g/dL (ref 31.5–36.0)
MONO#: 0.4 10*3/uL (ref 0.1–0.9)
NEUT#: 4.6 10*3/uL (ref 1.5–6.5)
NEUT%: 78.7 % — ABNORMAL HIGH (ref 38.4–76.8)
Platelets: 223 10*3/uL (ref 145–400)
RBC: 4.37 10*6/uL (ref 3.70–5.45)
WBC: 5.8 10*3/uL (ref 3.9–10.3)
lymph#: 0.7 10*3/uL — ABNORMAL LOW (ref 0.9–3.3)

## 2012-12-30 LAB — COMPREHENSIVE METABOLIC PANEL (CC13)
ALT: 22 U/L (ref 0–55)
Albumin: 3.7 g/dL (ref 3.5–5.0)
Anion Gap: 10 mEq/L (ref 3–11)
CO2: 24 mEq/L (ref 22–29)
Calcium: 9.7 mg/dL (ref 8.4–10.4)
Chloride: 107 mEq/L (ref 98–109)
Creatinine: 0.7 mg/dL (ref 0.6–1.1)
Glucose: 89 mg/dl (ref 70–140)
Potassium: 4.5 mEq/L (ref 3.5–5.1)
Total Protein: 6.6 g/dL (ref 6.4–8.3)

## 2012-12-30 MED ORDER — TEMOZOLOMIDE 250 MG PO CAPS: 250.0000 mg | ORAL_CAPSULE | Freq: Every day | ORAL | Status: DC

## 2012-12-30 MED ORDER — TEMOZOLOMIDE 20 MG PO CAPS: 20.0000 mg | ORAL_CAPSULE | Freq: Every day | ORAL | Status: DC

## 2012-12-30 MED ORDER — GADOBENATE DIMEGLUMINE 529 MG/ML IV SOLN
13.0000 mL | Freq: Once | INTRAVENOUS | Status: AC | PRN
  Administered 2012-12-30: 13 mL via INTRAVENOUS

## 2012-12-30 NOTE — Progress Notes (Signed)
OFFICE PROGRESS NOTE  CC  [name],[name], MD [address] [place] [address] Dr. 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  PRIOR THERAPY:  #1 patient originally presented with 2-3 week loss of memory with trouble finding words. She had one episode of left parietal headache. Because of this she was seen by her internist an MRI of the brain was done on 12/13/2011 this showed a ring enhancing lesion in the left temporal lobe measuring 2.7 x 3.6 x 2.5 cm with 45 surrounding satellite lesions. She had a complete systemic workup including CT of the chest abdomen and pelvis that was negative. She went on to be seen by neurosurgery Dr. Phoebe Perch and patient underwent subtotal resection of the dominant lesion on October 14. 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 She went on to see Dr. Kennedy Bucker for 2 to who has been treating her she was also seen by Dr. Margaretmary Dys.  #2 patient was begun on radiation therapy with concurrent Temodar at 75 mg per meter squared. She completed concurrent Temodar and radiation therapy in December 2013.  #3 patient was seen at [facility] as well as [facility]. [facility] has recommended the patient began adjuvant Temodar at 150 milligrams per meter squared she tolerates the first cycle do we could increase the dose to 200 mg per meter squared days one through 5 on a 28 day cycle.  #4 patient began adjuvant Temodar cycle 1 on 03/13/12 through 03/18/12 initially at 150 mg per meter squared. She tolerated this well. Then the recommendation was to increase the dose initially at a higher dose of 200 mg per meter squared. However she could not tolerate this. So the third cycle was reduced to 150 mg per meter squared days one through 5.  CURRENT THERAPY:patient will begin cycle #10 of Temodar  days 1 through 5 , at 150mg  per meter squared, 270mg .   INTERVAL HISTORY: Kelly Maxwell 67 y.o. female returns today for evaluation prior to her Temodar.  She had her appt at [facility] on September 9, and will follow up with them again prior to cycle 11 on Monday, 01/05/13.  She has new tingling in her feet bilaterally, otherwise, she denies any new headaches, nausea, vomiting, weakness, vision changes, or any other concerns.   MEDICAL HISTORY: Past Medical History  Diagnosis Date  . PONV (postoperative nausea and vomiting)   . Depression   . GERD (gastroesophageal reflux disease)   . Headache(784.0)   . HLD (hyperlipidemia)   . Brain tumor 12/18/2011    Left temporal lobe 2.7 x 3.6 x 2.5 cm ring enhancing; numerous small surrounding satellite lesions 12/13/11  . 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 Hiatal hernia   . History of radiation therapy 01/07/2012-02/18/12    left temporal  58GY    ALLERGIES:  is allergic to codeine and compazine.  MEDICATIONS:  Current Outpatient Prescriptions  Medication Sig Dispense Refill  . aspirin EC 81 MG tablet Take 81 mg by mouth at bedtime.      Marland Kitchen buPROPion (WELLBUTRIN XL) 300 MG 24 hr tablet Take 300 mg by mouth every morning.      . Calcium Carbonate-Vitamin D (CALCIUM + D PO) Take 1 tablet by mouth 2 (two) times daily.      . cholecalciferol (VITAMIN D) 1000 UNITS tablet  Take 1,000 Units by mouth daily.      . Cholecalciferol (VITAMIN D-1000 MAX ST) 1000 UNITS tablet Take by mouth. Take 1,000 Units by mouth daily.      . clonazePAM (KLONOPIN) 0.5 MG tablet Take 0.5 mg by mouth at bedtime as needed.      . levETIRAcetam (KEPPRA) 500 MG tablet Take 1 tablet (500 mg total) by mouth every 12 (twelve) hours.  60 tablet  1  . Melatonin 3 MG CAPS Take 1 capsule (3 mg total) by mouth at bedtime as needed.    0  . Multiple Vitamin (MULTIVITAMIN WITH MINERALS) TABS Take 1 tablet by mouth daily.      . ondansetron (ZOFRAN-ODT) 8 MG  disintegrating tablet Take 1 tablet (8 mg total) by mouth every 8 (eight) hours as needed for nausea.  30 tablet  12  . pantoprazole (PROTONIX) 40 MG tablet Take 40 mg by mouth daily.      . promethazine (PHENERGAN) 25 MG suppository Place 1 suppository (25 mg total) rectally every 6 (six) hours as needed for nausea.  20 each  3  . SANCUSO 3.1 MG/24HR       . simvastatin (ZOCOR) 40 MG tablet Take 40 mg by mouth at bedtime.      . sulfamethoxazole-trimethoprim (BACTRIM DS) 800-160 MG per tablet       . temozolomide (TEMODAR) 20 MG capsule Take 1 capsule (20 mg total) by mouth daily. May take on an empty stomach or at bedtime to decrease nausea & vomiting.  5 capsule  0  . temozolomide (TEMODAR) 250 MG capsule Take 1 capsule (250 mg total) by mouth daily. May take on an empty stomach or at bedtime to decrease nausea & vomiting.  5 capsule  0  . tretinoin (RETIN-A) 0.025 % cream Apply 1 application topically daily.       No current facility-administered medications for this visit.    SURGICAL HISTORY:  Past Surgical History  Procedure Laterality Date  . Total knee arthroplasty  2000    left  . Bunionectomy  2001    right  . Craniotomy  12/17/2011    Procedure: CRANIOTOMY TUMOR EXCISION;  Surgeon: Otilio Connors, MD;  Location: [place] [facility];  Service: Neurosurgery;  Laterality: Left;  LEFT temporal craniotomy with stealth for tumor resection  . Dilation and curettage of uterus  2007    REVIEW OF SYSTEMS:   A 10 point review of systems was conducted and is otherwise negative except for what is noted above.    PHYSICAL EXAMINATION: There were no vitals taken for this visit. There is no weight on file to calculate BMI. General: Patient is a well appearing female in no acute distress HEENT: PERRLA, sclerae anicteric no conjunctival pallor, MMM Neck: supple, no palpable adenopathy Lungs: clear to auscultation bilaterally, no wheezes, rhonchi, or rales Cardiovascular: regular rate rhythm, S1,  S2, no murmurs, rubs or gallops Abdomen: Soft, non-tender, non-distended, normoactive bowel sounds, no HSM Extremities: warm and well perfused, no clubbing, cyanosis, or edema Skin: No rashes or lesions Neuro: CN II-XII intact ECOG PERFORMANCE STATUS: 1 - Symptomatic but completely ambulatory   LABORATORY DATA: Lab Results  Component Value Date   WBC 5.5 12/02/2012   HGB 13.6 12/02/2012   HCT 40.1 12/02/2012   MCV 92.2 12/02/2012   PLT 255 12/02/2012      Chemistry      Component Value Date/Time   NA 142 12/02/2012 1450   NA 138 12/18/2011 0440  K 4.1 12/02/2012 1450   K 4.0 12/18/2011 0440   CL 111* 08/04/2012 1008   CL 107 12/18/2011 0440   CO2 26 12/02/2012 1450   CO2 22 12/18/2011 0440   BUN 15.6 12/02/2012 1450   BUN 17 12/18/2011 0440   CREATININE 0.7 12/02/2012 1450   CREATININE 0.50 12/18/2011 0440   CREATININE 0.50 12/12/2011 0000      Component Value Date/Time   CALCIUM 9.4 12/02/2012 1450   CALCIUM 8.7 12/18/2011 0440   ALKPHOS 100 12/02/2012 1450   AST 22 12/02/2012 1450   ALT 25 12/02/2012 1450   BILITOT 0.50 12/02/2012 1450       RADIOGRAPHIC STUDIES:  No results found.  ASSESSMENT: 67 year old female with  #1 WHO grade 4 glioblastoma multiforme. She is status post partial resection on 12/17/2011. She has completed Temodar and concurrent radiation therapy in December 2013.  #2 on 1/10/ 2014 through 03/18/2012 patient received adjuvant Temodar at 150 mg per meter squared. She has tolerated this dose very well. Because of good tolerance the patient was dose increased 200 mg per meter squared per [facility] recommendations.  However she had difficulty taking this cycle and her dose was subsequently reduced to 150 mg per meter squared for a total of 270 mg days one through 5.  Her tumor continues to be stable, we are awaiting MRI results from today.    PLAN:  #1  Patient will proceed with scheduled cycle 11 of day 1 through 5 of Temodar beginning on 01/01/13 - 01/05/13  tentatively.  I will call her with her MRI results tomorrow.    #2 Patient will return in 4 weeks for her next appointment and evaluation.    All questions were answered. The patient knows to call the clinic with any problems, questions or concerns. We can certainly see the patient much sooner if necessary.  I spent 25 minutes counseling the patient face to face. The total time spent in the appointment was 30 minutes.  Minette Headland, [place] [phone] 12/30/2012, 2:01 PM   ATTENDING'S ATTESTATION:  I personally reviewed patient's chart, examined patient myself, formulated the treatment plan as followed.    Clinically patient seems to be doing well. She is due for cycle 11 of Temodar days 1 through 5 starting 01/01/2013 through 01/05/2013. Patient did have MRI of the brain performed today. It has not been read by the radiologist yet. Patient also has a appointment set up with [facility] in the next few days or so.  Depending on what the results of the MRI we are we may or may not have to change her therapy. I await full consultation from [place] regarding their recommendations before making any changes in her present therapy.  Marcy Panning, MD Medical/Oncology [facility] [phone] (beeper) [phone] (Office)  01/06/2013, 4:17 PM

## 2012-12-31 ENCOUNTER — Ambulatory Visit
Admission: RE | Admit: 2012-12-31 | Discharge: 2012-12-31 | Disposition: A | Discharge status: Home / Self Care | Payer: Medicare Other | Source: Ambulatory Visit | Attending: Radiation Oncology | Admitting: Radiation Oncology

## 2012-12-31 ENCOUNTER — Encounter: Payer: Self-pay | Admitting: Radiation Oncology

## 2012-12-31 ENCOUNTER — Other Ambulatory Visit: Payer: Self-pay | Admitting: *Deleted

## 2012-12-31 VITALS — BP 133/66 | HR 73 | Temp 97.0°F | Resp 16 | Wt 147.1 lb

## 2012-12-31 DIAGNOSIS — D496 Neoplasm of unspecified behavior of brain: Secondary | ICD-10-CM

## 2012-12-31 NOTE — Telephone Encounter (Signed)
Pt using [facility] program for medications. Unable to find this programs to send Temodar rx. Called pt lmovm to call back and provide additional information. S/w with Mr. Acero, pt using [facility] for [employer]. Rx faxed.

## 2012-12-31 NOTE — Progress Notes (Signed)
Here to obtain results of recent MRI scan. Reports hot flashes continue. Reports mild fatigue continues but, isn't worse. Maintaining weight. Denies confusion. Reports occasional difficulty with recall. Report chronic left ear ringing continues. Denies dizziness or headaches. Denies nausea or vomiting but, reports taking an antiemetic every morning. Reports taking temodar as directed. Not taking keppra at this time because it caused her to be drowsy.

## 2012-12-31 NOTE — Telephone Encounter (Signed)
Pt called advised she uses

## 2012-12-31 NOTE — Progress Notes (Signed)
Radiation Oncology         [phone] ________________________________  Name: Kelly Maxwell MRN: [id number]  Date: 12/31/2012  DOB: 12-09-1945  Multidisciplinary Neuro Oncology Clinic Follow-Up Visit Note  CC: [name], MD  [name], MD  Diagnosis:   67 year old woman s/p partial resection of a multifocal left temporal grade IV gliosarcoma s/p radiotherapy to 60 Gy - 01/07/2012-02/18/2012   Interval Since Last Radiation:  10  months  Narrative:  The patient returns today for routine follow-up.  The recent films were presented in our multidisciplinary conference with neuroradiology just prior to the clinic.  She has minimal symptoms and remains very active.                              ALLERGIES:  is allergic to codeine and compazine.  Meds: Current Outpatient Prescriptions  Medication Sig Dispense Refill  . aspirin EC 81 MG tablet Take 81 mg by mouth at bedtime.      Marland Kitchen buPROPion (WELLBUTRIN XL) 300 MG 24 hr tablet Take 300 mg by mouth every morning.      . Calcium Carbonate-Vitamin D (CALCIUM + D PO) Take 1 tablet by mouth 2 (two) times daily.      . cholecalciferol (VITAMIN D) 1000 UNITS tablet Take 1,000 Units by mouth daily.      . clonazePAM (KLONOPIN) 0.5 MG tablet Take 0.5 mg by mouth at bedtime as needed.      . levETIRAcetam (KEPPRA) 500 MG tablet Take 1 tablet (500 mg total) by mouth every 12 (twelve) hours.  60 tablet  1  . Melatonin 3 MG CAPS Take 1 capsule (3 mg total) by mouth at bedtime as needed.    0  . Multiple Vitamin (MULTIVITAMIN WITH MINERALS) TABS Take 1 tablet by mouth daily.      . ondansetron (ZOFRAN-ODT) 8 MG disintegrating tablet Take 1 tablet (8 mg total) by mouth every 8 (eight) hours as needed for nausea.  30 tablet  12  . pantoprazole (PROTONIX) 40 MG tablet Take 40 mg by mouth daily.      . promethazine (PHENERGAN) 25 MG suppository Place 1 suppository (25 mg total) rectally every 6 (six) hours as needed for nausea.  20 each  3  .  SANCUSO 3.1 MG/24HR       . simvastatin (ZOCOR) 40 MG tablet Take 40 mg by mouth at bedtime.      . sulfamethoxazole-trimethoprim (BACTRIM DS) 800-160 MG per tablet       . temozolomide (TEMODAR) 20 MG capsule Take 1 capsule (20 mg total) by mouth daily. May take on an empty stomach or at bedtime to decrease nausea & vomiting.  5 capsule  0  . temozolomide (TEMODAR) 250 MG capsule Take 1 capsule (250 mg total) by mouth daily. May take on an empty stomach or at bedtime to decrease nausea & vomiting.  5 capsule  0  . tretinoin (RETIN-A) 0.025 % cream Apply 1 application topically daily. 0.5%       No current facility-administered medications for this encounter.    Physical Findings: The patient is in no acute distress. Patient is alert and oriented.  weight is 147 lb 1.6 oz (66.724 kg). Her oral temperature is 97 F (36.1 C). Her blood pressure is 133/66 and her pulse is 73. Her respiration is 16 and oxygen saturation is 100%. .  No significant changes.  Lab Findings: Lab Results  Component Value  Date   WBC 5.8 12/30/2012   HGB 13.4 12/30/2012   HCT 40.3 12/30/2012   MCV 92.3 12/30/2012   PLT 223 12/30/2012    Radiographic Findings: Mr Laqueta Jean ZO Contrast  12/30/2012   CLINICAL DATA:  Multifocal grade 4 Gliosarcoma. Status post radiation. Restaging.  EXAM: MRI HEAD WITHOUT AND WITH CONTRAST  TECHNIQUE: Multiplanar, multiecho pulse sequences of the brain and surrounding structures were obtained according to standard protocol without and with intravenous contrast  CONTRAST:  13mL MULTIHANCE GADOBENATE DIMEGLUMINE 529 MG/ML IV SOLN  COMPARISON:  Multiple priors, most recent 10/30/2012.  FINDINGS: Interval growth of the enhancing subcortical insular lesion on the left now 4 x 6 mm as compared with 2 x 5 mm previous. Fairly unimpressive enhancement in the subarachnoid spaces on the left and inferior frontal lobe. No other new lesions are seen. No restricted diffusion or blood products. Mild  cerebral and cerebellar atrophy. Diffuse confluent white matter signal abnormality likely post XRT changes is worse on the left. Focal gliosis/encephalomalacia left frontotemporal region also stable.  No acute stroke. No osseous lesions. Flow voids are maintained. No acute hemorrhage or extra-axial fluid. No acute sinus or mastoid disease.  IMPRESSION: Interval growth of the enhancing subcortical insular lesion on the left, now 4 x 6 mm as compared with 2.5 mm previous. Recurrent tumor not excluded. No evidence for post treatment changes.   Electronically Signed   By: Davonna Belling M.D.   On: 12/30/2012 16:17   Mm Digital Screening  12/09/2012   *RADIOLOGY REPORT*  Clinical Data: Screening.  DIGITAL SCREENING BILATERAL MAMMOGRAM WITH CAD  Comparison:  Previous exam(s).  FINDINGS:  ACR Breast Density Category b:  There are scattered areas of fibroglandular density.  There are no findings suspicious for malignancy.  Images were processed with CAD.  IMPRESSION: No mammographic evidence of malignancy.  A result letter of this screening mammogram will be mailed directly to the patient.  RECOMMENDATION: Screening mammogram in one year. (Code:SM-B-01Y)  BI-RADS CATEGORY 1:  Negative.   Original Report Authenticated By: Rolla Plate, M.D.   Impression:  The patient continues to maintain a high performance status.  Her small focus of enhancement in the left insular region appears more well-circumscribed and slightly larger.  This could represent recurrent tumor versus possible radionecrosis.  Salvage therapy at this time might be a reasonable option versus ongoing surveillance versus image guided biopsy and possible AutoLITT treatment.  Plan:  The patient will be seen at [facility] Monday. We will look forward to their input and recommendations.  _____________________________________  Artist Pais Kathrynn Running, M.D.

## 2013-01-01 ENCOUNTER — Ambulatory Visit: Payer: Medicare Other | Admitting: Radiation Oncology

## 2013-01-08 ENCOUNTER — Other Ambulatory Visit: Payer: Self-pay

## 2013-01-14 ENCOUNTER — Other Ambulatory Visit: Payer: Self-pay | Admitting: Radiation Therapy

## 2013-01-14 DIAGNOSIS — D496 Neoplasm of unspecified behavior of brain: Secondary | ICD-10-CM

## 2013-01-21 ENCOUNTER — Telehealth: Payer: Self-pay | Admitting: Oncology

## 2013-01-22 ENCOUNTER — Ambulatory Visit: Payer: Medicare Other | Admitting: Adult Health

## 2013-01-22 ENCOUNTER — Other Ambulatory Visit: Payer: Medicare Other | Admitting: Lab

## 2013-01-27 ENCOUNTER — Ambulatory Visit
Admission: RE | Admit: 2013-01-27 | Discharge: 2013-01-27 | Disposition: A | Discharge status: Home / Self Care | Payer: Medicare Other | Source: Ambulatory Visit | Attending: Radiation Oncology | Admitting: Radiation Oncology

## 2013-01-27 ENCOUNTER — Encounter: Payer: Self-pay | Admitting: Radiation Oncology

## 2013-01-27 DIAGNOSIS — D496 Neoplasm of unspecified behavior of brain: Secondary | ICD-10-CM

## 2013-01-27 MED ORDER — GADOBENATE DIMEGLUMINE 529 MG/ML IV SOLN
14.0000 mL | Freq: Once | INTRAVENOUS | Status: AC | PRN
  Administered 2013-01-27: 14 mL via INTRAVENOUS

## 2013-02-03 ENCOUNTER — Other Ambulatory Visit: Payer: Self-pay | Admitting: Emergency Medicine

## 2013-02-03 DIAGNOSIS — D496 Neoplasm of unspecified behavior of brain: Secondary | ICD-10-CM

## 2013-02-04 ENCOUNTER — Telehealth: Payer: Self-pay | Admitting: Oncology

## 2013-02-04 ENCOUNTER — Ambulatory Visit (HOSPITAL_BASED_OUTPATIENT_CLINIC_OR_DEPARTMENT_OTHER): Payer: Medicare Other | Admitting: Adult Health

## 2013-02-04 ENCOUNTER — Encounter: Payer: Self-pay | Admitting: Adult Health

## 2013-02-04 ENCOUNTER — Other Ambulatory Visit (HOSPITAL_BASED_OUTPATIENT_CLINIC_OR_DEPARTMENT_OTHER): Payer: Medicare Other | Admitting: Lab

## 2013-02-04 VITALS — BP 123/71 | HR 84 | Temp 98.7°F | Resp 18 | Ht 65.0 in | Wt 148.1 lb

## 2013-02-04 DIAGNOSIS — C712 Malignant neoplasm of temporal lobe: Secondary | ICD-10-CM

## 2013-02-04 DIAGNOSIS — D496 Neoplasm of unspecified behavior of brain: Secondary | ICD-10-CM

## 2013-02-04 LAB — CBC WITH DIFFERENTIAL/PLATELET
BASO%: 0.5 % (ref 0.0–2.0)
Basophils Absolute: 0 10*3/uL (ref 0.0–0.1)
EOS%: 3.2 % (ref 0.0–7.0)
Eosinophils Absolute: 0.1 10*3/uL (ref 0.0–0.5)
HGB: 13.4 g/dL (ref 11.6–15.9)
LYMPH%: 11.9 % — ABNORMAL LOW (ref 14.0–49.7)
MCH: 31.2 pg (ref 25.1–34.0)
MCV: 94.2 fL (ref 79.5–101.0)
MONO%: 6.5 % (ref 0.0–14.0)
Platelets: 181 10*3/uL (ref 145–400)
RBC: 4.29 10*6/uL (ref 3.70–5.45)
RDW: 14.2 % (ref 11.2–14.5)
lymph#: 0.4 10*3/uL — ABNORMAL LOW (ref 0.9–3.3)

## 2013-02-04 LAB — COMPREHENSIVE METABOLIC PANEL (CC13)
AST: 28 U/L (ref 5–34)
Albumin: 3.6 g/dL (ref 3.5–5.0)
Alkaline Phosphatase: 95 U/L (ref 40–150)
BUN: 17.7 mg/dL (ref 7.0–26.0)
Glucose: 68 mg/dl — ABNORMAL LOW (ref 70–140)
Potassium: 4.1 mEq/L (ref 3.5–5.1)
Total Bilirubin: 0.77 mg/dL (ref 0.20–1.20)

## 2013-02-04 MED ORDER — TEMOZOLOMIDE 250 MG PO CAPS: 250.0000 mg | ORAL_CAPSULE | Freq: Every day | ORAL | Status: DC

## 2013-02-04 MED ORDER — TEMOZOLOMIDE 20 MG PO CAPS: 20.0000 mg | ORAL_CAPSULE | Freq: Every day | ORAL | Status: DC

## 2013-02-04 NOTE — Progress Notes (Addendum)
OFFICE PROGRESS NOTE  CC  [name],[name], MD [address], [address] [place] [address] 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  PRIOR THERAPY:  #1 patient originally presented with 2-3 week loss of memory with trouble finding words. She had one episode of left parietal headache. Because of this she was seen by her internist an MRI of the brain was done on 12/13/2011 this showed a ring enhancing lesion in the left temporal lobe measuring 2.7 x 3.6 x 2.5 cm with 45 surrounding satellite lesions. She had a complete systemic workup including CT of the chest abdomen and pelvis that was negative. She went on to be seen by neurosurgery Dr. Luiz Ochoa and patient underwent subtotal resection of the dominant lesion on October 14. 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 She went on to see Dr. Fatima Sanger for 2 to who has been treating her she was also seen by [name].  #2 patient was begun on radiation therapy with concurrent Temodar at 75 mg per meter squared. She completed concurrent Temodar and radiation therapy in December 2013.  #3 patient was seen at [facility] as well as [facility]. [facility] has recommended the patient began adjuvant Temodar at 150 milligrams per meter squared she tolerates the first cycle do we could increase the dose to 200 mg per meter squared days one through 5 on a 28 day cycle.  #4 patient began adjuvant Temodar cycle 1 on 03/13/12 through 03/18/12 initially at 150 mg per meter squared. She tolerated this well. Then the recommendation was to increase the dose initially at a higher dose of 200 mg per meter squared. However she could not tolerate this. So the third cycle was reduced to 150 mg per meter squared days one through 5.  The plan is for the patient to receive 18 cycles of Temodar.    CURRENT THERAPY:patient will begin cycle #12 of Temodar days 1 through 5 , at 150mg  per meter squared, 270mg .   INTERVAL HISTORY: VALLERY MCDADE 67 y.o. female returns today for evaluation prior to her Temodar.  She had her appt at [facility] and her MRI didn't demonstrate progression.  They recommended she proceed with cycle 12 and 13 of Temodar.  They will f/u with her along with an MRI in 2 months.  She is doing well.  She continues to experience tingling in her toes, but otherwise, a 10 point ROS is stable.  She was recommended to go on Keppra as seizure prophylaxis, but she doesn't want to take it, and has declined that therapy.    MEDICAL HISTORY: Past Medical History  Diagnosis Date  . PONV (postoperative nausea and vomiting)   . Depression   . GERD (gastroesophageal reflux disease)   . Headache(784.0)   . HLD (hyperlipidemia)   . Brain tumor 12/18/2011    Left temporal lobe 2.7 x 3.6 x 2.5 cm ring enhancing; numerous small surrounding satellite lesions 12/13/11  . 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 Hiatal hernia   . History of radiation therapy 01/07/2012-02/18/12    left temporal  58GY    ALLERGIES:  is allergic to codeine and compazine.  MEDICATIONS:  Current Outpatient Prescriptions  Medication Sig Dispense Refill  . aspirin EC 81 MG tablet Take 81 mg by mouth at bedtime.      Marland Kitchen  buPROPion (WELLBUTRIN XL) 300 MG 24 hr tablet Take 300 mg by mouth every morning.      . Calcium Carbonate-Vitamin D (CALCIUM + D PO) Take 1 tablet by mouth 2 (two) times daily.      . cholecalciferol (VITAMIN D) 1000 UNITS tablet Take 1,000 Units by mouth daily.      . clonazePAM (KLONOPIN) 0.5 MG tablet Take 0.5 mg by mouth at bedtime as needed.      [name] (ATIVAN) 1 MG tablet Take by mouth.      . Melatonin 3 MG CAPS Take 1 capsule (3 mg total) by mouth at bedtime as needed.    0  . Multiple Vitamin (MULTIVITAMIN WITH MINERALS) TABS Take 1 tablet by mouth daily.       . ondansetron (ZOFRAN-ODT) 8 MG disintegrating tablet Take 1 tablet (8 mg total) by mouth every 8 (eight) hours as needed for nausea.  30 tablet  12  . pantoprazole (PROTONIX) 40 MG tablet Take 40 mg by mouth daily.      . promethazine (PHENERGAN) 25 MG suppository Place 1 suppository (25 mg total) rectally every 6 (six) hours as needed for nausea.  20 each  3  . simvastatin (ZOCOR) 40 MG tablet Take 40 mg by mouth at bedtime.      . temozolomide (TEMODAR) 20 MG capsule Take 1 capsule (20 mg total) by mouth daily. May take on an empty stomach or at bedtime to decrease nausea & vomiting.  5 capsule  0  . temozolomide (TEMODAR) 250 MG capsule Take 1 capsule (250 mg total) by mouth daily. May take on an empty stomach or at bedtime to decrease nausea & vomiting.  5 capsule  0  . tretinoin (RETIN-A) 0.025 % cream Apply 1 application topically daily. 0.5%      . tretinoin (RETIN-A) 0.05 % cream Apply topically.      . levETIRAcetam (KEPPRA) 500 MG tablet Take 1 tablet (500 mg total) by mouth every 12 (twelve) hours.  60 tablet  1  . sulfamethoxazole-trimethoprim (BACTRIM DS) 800-160 MG per tablet        No current facility-administered medications for this visit.    SURGICAL HISTORY:  Past Surgical History  Procedure Laterality Date  . Total knee arthroplasty  2000    left  . Bunionectomy  2001    right  . Craniotomy  12/17/2011    Procedure: CRANIOTOMY TUMOR EXCISION;  Surgeon: Otilio Connors, MD;  Location: [place] [facility];  Service: Neurosurgery;  Laterality: Left;  LEFT temporal craniotomy with stealth for tumor resection  . Dilation and curettage of uterus  2007    REVIEW OF SYSTEMS:   A 10 point review of systems was conducted and is otherwise negative except for what is noted above.    PHYSICAL EXAMINATION: Blood pressure 123/71, pulse 84, temperature 98.7 F (37.1 C), temperature source Oral, resp. rate 18, height 5\' 5"  (1.651 m), weight 148 lb 2 oz (67.189 kg). Body mass index is  24.65 kg/(m^2). General: Patient is a well appearing female in no acute distress HEENT: PERRLA, sclerae anicteric no conjunctival pallor, MMM Neck: supple, no palpable adenopathy Lungs: clear to auscultation bilaterally, no wheezes, rhonchi, or rales Cardiovascular: regular rate rhythm, S1, S2, no murmurs, rubs or gallops Abdomen: Soft, non-tender, non-distended, normoactive bowel sounds, no HSM Extremities: warm and well perfused, no clubbing, cyanosis, or edema Skin: No rashes or lesions Neuro: CN II-XII intact, left eye ptosis ECOG PERFORMANCE STATUS: 1 - Symptomatic  but completely ambulatory   LABORATORY DATA: Lab Results  Component Value Date   WBC 3.7* 02/04/2013   HGB 13.4 02/04/2013   HCT 40.4 02/04/2013   MCV 94.2 02/04/2013   PLT 181 02/04/2013      Chemistry      Component Value Date/Time   NA 145 02/04/2013 1028   NA 138 12/18/2011 0440   K 4.1 02/04/2013 1028   K 4.0 12/18/2011 0440   CL 111* 08/04/2012 1008   CL 107 12/18/2011 0440   CO2 27 02/04/2013 1028   CO2 22 12/18/2011 0440   BUN 17.7 02/04/2013 1028   BUN 17 12/18/2011 0440   CREATININE 0.7 02/04/2013 1028   CREATININE 0.50 12/18/2011 0440   CREATININE 0.50 12/12/2011 0000      Component Value Date/Time   CALCIUM 9.4 02/04/2013 1028   CALCIUM 8.7 12/18/2011 0440   ALKPHOS 95 02/04/2013 1028   AST 28 02/04/2013 1028   ALT 32 02/04/2013 1028   BILITOT 0.77 02/04/2013 1028       RADIOGRAPHIC STUDIES:  No results found.  ASSESSMENT: 67 year old female with  #1 WHO grade 4 glioblastoma multiforme. She is status post partial resection on 12/17/2011. She has completed Temodar and concurrent radiation therapy in December 2013.  #2 on 1/10/ 2014 through 03/18/2012 patient received adjuvant Temodar at 150 mg per meter squared. She has tolerated this dose very well. Because of good tolerance the patient was dose increased 200 mg per meter squared per [facility] recommendations.  However she had difficulty taking this  cycle and her dose was subsequently reduced to 150 mg per meter squared for a total of 270 mg days one through 5.  Her tumor continues to be stable.    PLAN:  #1  Patient will proceed with scheduled cycle 12 of day 1 through 5 of Temodar beginning on 02/05/13 tentatively. We faxed her Temodar prescription in.    #2 Patient will return in 4 weeks for her next appointment and evaluation.    All questions were answered. The patient knows to call the clinic with any problems, questions or concerns. We can certainly see the patient much sooner if necessary.  I spent 25 minutes counseling the patient face to face. The total time spent in the appointment was 30 minutes.  Minette Headland, [place] [phone] 02/05/2013, 12:01 PM  ATTENDING'S ATTESTATION:  I personally reviewed patient's chart, examined patient myself, formulated the treatment plan as followed.    Overall patient looks good. She was seen at [facility] MRI of her brain is stable. They have recommended continuing Temodar adjuvantly every month. She will proceed with her next cycle.  Marcy Panning, MD Medical/Oncology [facility] [phone] (beeper) [phone] (Office)  02/09/2013, 12:43 AM

## 2013-02-04 NOTE — Telephone Encounter (Signed)
, °

## 2013-02-12 ENCOUNTER — Encounter: Payer: Self-pay | Admitting: Oncology

## 2013-02-12 NOTE — Progress Notes (Signed)
DR.KHAN'S NURSE, MEREDITH WALTON,RN SPOKE TO PT.'S HUSBAND CONCERNING FORM. IT WAS PLACED UP FRONT FOR PT.

## 2013-03-02 ENCOUNTER — Telehealth: Payer: Self-pay | Admitting: Oncology

## 2013-03-02 ENCOUNTER — Other Ambulatory Visit (HOSPITAL_BASED_OUTPATIENT_CLINIC_OR_DEPARTMENT_OTHER): Payer: Medicare Other

## 2013-03-02 ENCOUNTER — Encounter: Payer: Self-pay | Admitting: Adult Health

## 2013-03-02 ENCOUNTER — Other Ambulatory Visit: Payer: Self-pay | Admitting: Emergency Medicine

## 2013-03-02 ENCOUNTER — Ambulatory Visit (HOSPITAL_BASED_OUTPATIENT_CLINIC_OR_DEPARTMENT_OTHER): Payer: Medicare Other | Admitting: Adult Health

## 2013-03-02 VITALS — BP 121/78 | HR 73 | Temp 98.4°F | Resp 18 | Ht 65.0 in | Wt 147.1 lb

## 2013-03-02 DIAGNOSIS — D496 Neoplasm of unspecified behavior of brain: Secondary | ICD-10-CM

## 2013-03-02 DIAGNOSIS — C719 Malignant neoplasm of brain, unspecified: Secondary | ICD-10-CM

## 2013-03-02 LAB — COMPREHENSIVE METABOLIC PANEL (CC13)
ALT: 25 U/L (ref 0–55)
AST: 16 U/L (ref 5–34)
Albumin: 3.8 g/dL (ref 3.5–5.0)
Creatinine: 0.7 mg/dL (ref 0.6–1.1)
Total Bilirubin: 0.61 mg/dL (ref 0.20–1.20)

## 2013-03-02 LAB — CBC WITH DIFFERENTIAL/PLATELET
BASO%: 0.9 % (ref 0.0–2.0)
EOS%: 2.7 % (ref 0.0–7.0)
HCT: 39.7 % (ref 34.8–46.6)
LYMPH%: 14.7 % (ref 14.0–49.7)
MCH: 31.2 pg (ref 25.1–34.0)
MCHC: 33.6 g/dL (ref 31.5–36.0)
NEUT%: 76.6 % (ref 38.4–76.8)
Platelets: 243 10*3/uL (ref 145–400)
RBC: 4.27 10*6/uL (ref 3.70–5.45)
lymph#: 0.6 10*3/uL — ABNORMAL LOW (ref 0.9–3.3)

## 2013-03-02 NOTE — Progress Notes (Signed)
OFFICE PROGRESS NOTE  CC  [name],[name], MD [address], [address] [place] [address] 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  PRIOR THERAPY:  #1 patient originally presented with 2-3 week loss of memory with trouble finding words. She had one episode of left parietal headache. Because of this she was seen by her internist an MRI of the brain was done on 12/13/2011 this showed a ring enhancing lesion in the left temporal lobe measuring 2.7 x 3.6 x 2.5 cm with 45 surrounding satellite lesions. She had a complete systemic workup including CT of the chest abdomen and pelvis that was negative. She went on to be seen by neurosurgery Dr. Luiz Ochoa and patient underwent subtotal resection of the dominant lesion on October 14. 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 She went on to see Dr. Fatima Sanger for 2 to who has been treating her she was also seen by [name].  #2 patient was begun on radiation therapy with concurrent Temodar at 75 mg per meter squared. She completed concurrent Temodar and radiation therapy in December 2013.  #3 patient was seen at [facility] as well as [facility]. [facility] has recommended the patient began adjuvant Temodar at 150 milligrams per meter squared she tolerates the first cycle do we could increase the dose to 200 mg per meter squared days one through 5 on a 28 day cycle.  #4 patient began adjuvant Temodar cycle 1 on 03/13/12 through 03/18/12 initially at 150 mg per meter squared. She tolerated this well. Then the recommendation was to increase the dose initially at a higher dose of 200 mg per meter squared. However she could not tolerate this. So the third cycle was reduced to 150 mg per meter squared days one through 5.  The plan is for the patient to receive 18 cycles of Temodar.    CURRENT THERAPY:patient will begin cycle #13 of Temodar days 1 through 5 , at 150mg  per meter squared, 270mg .   INTERVAL HISTORY: Kelly Maxwell 67 y.o. female returns today for evaluation prior to her Temodar.  She had her appt at [facility] in the beginning of December, 2014  and her MRI didn't demonstrate progression.  They recommended she proceed with cycle 12 and 13 of Temodar.  They will f/u with her along with an MRI in 1 month.  She continues to do well.  She denies any weakness, numbness, nausea, vomiting, fevers, headaches. Her only problem is her hernia which she is f/u with her PCP about in January.    MEDICAL HISTORY: Past Medical History  Diagnosis Date  . PONV (postoperative nausea and vomiting)   . Depression   . GERD (gastroesophageal reflux disease)   . Headache(784.0)   . HLD (hyperlipidemia)   . Brain tumor 12/18/2011    Left temporal lobe 2.7 x 3.6 x 2.5 cm ring enhancing; numerous small surrounding satellite lesions 12/13/11  . 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 Hiatal hernia   . History of radiation therapy 01/07/2012-02/18/12    left temporal  58GY    ALLERGIES:  is allergic to codeine and compazine.  MEDICATIONS:  Current Outpatient Prescriptions  Medication Sig Dispense Refill  . buPROPion (WELLBUTRIN XL) 300 MG 24 hr tablet Take 300 mg by mouth every morning.      . Calcium Carbonate-Vitamin  D (CALCIUM + D PO) Take 1 tablet by mouth 2 (two) times daily.      . cholecalciferol (VITAMIN D) 1000 UNITS tablet Take 1,000 Units by mouth daily.      . clonazePAM (KLONOPIN) 0.5 MG tablet Take 0.5 mg by mouth at bedtime as needed.      . Melatonin 3 MG CAPS Take 1 capsule (3 mg total) by mouth at bedtime as needed.    0  . Multiple Vitamin (MULTIVITAMIN WITH MINERALS) TABS Take 1 tablet by mouth daily.      . pantoprazole (PROTONIX) 40 MG tablet Take 40 mg by mouth daily.      . simvastatin (ZOCOR) 40 MG tablet Take 40 mg by mouth at  bedtime.      . tretinoin (RETIN-A) 0.05 % cream Apply topically.      Marland Kitchen aspirin EC 81 MG tablet Take 81 mg by mouth at bedtime.      [name] (ATIVAN) 1 MG tablet Take by mouth.      . ondansetron (ZOFRAN-ODT) 8 MG disintegrating tablet Take 1 tablet (8 mg total) by mouth every 8 (eight) hours as needed for nausea.  30 tablet  12  . promethazine (PHENERGAN) 25 MG suppository Place 1 suppository (25 mg total) rectally every 6 (six) hours as needed for nausea.  20 each  3  . sulfamethoxazole-trimethoprim (BACTRIM DS) 800-160 MG per tablet       . temozolomide (TEMODAR) 20 MG capsule Take 1 capsule (20 mg total) by mouth daily. May take on an empty stomach or at bedtime to decrease nausea & vomiting.  5 capsule  0  . temozolomide (TEMODAR) 250 MG capsule Take 1 capsule (250 mg total) by mouth daily. May take on an empty stomach or at bedtime to decrease nausea & vomiting.  5 capsule  0   No current facility-administered medications for this visit.    SURGICAL HISTORY:  Past Surgical History  Procedure Laterality Date  . Total knee arthroplasty  2000    left  . Bunionectomy  2001    right  . Craniotomy  12/17/2011    Procedure: CRANIOTOMY TUMOR EXCISION;  Surgeon: Otilio Connors, MD;  Location: [place] [facility];  Service: Neurosurgery;  Laterality: Left;  LEFT temporal craniotomy with stealth for tumor resection  . Dilation and curettage of uterus  2007    REVIEW OF SYSTEMS:   A 10 point review of systems was conducted and is otherwise negative except for what is noted above.    PHYSICAL EXAMINATION: Blood pressure 121/78, pulse 73, temperature 98.4 F (36.9 C), temperature source Oral, resp. rate 18, height 5\' 5"  (1.651 m), weight 147 lb 1.6 oz (66.724 kg). Body mass index is 24.48 kg/(m^2). General: Patient is a well appearing female in no acute distress HEENT: PERRLA, sclerae anicteric no conjunctival pallor, MMM Neck: supple, no palpable adenopathy Lungs: clear to auscultation  bilaterally, no wheezes, rhonchi, or rales Cardiovascular: regular rate rhythm, S1, S2, no murmurs, rubs or gallops Abdomen: Soft, non-tender, non-distended, normoactive bowel sounds, no HSM Extremities: warm and well perfused, no clubbing, cyanosis, or edema Skin: No rashes or lesions Neuro: CN II-XII intact, left eye ptosis ECOG PERFORMANCE STATUS: 1 - Symptomatic but completely ambulatory   LABORATORY DATA: Lab Results  Component Value Date   WBC 4.3 03/02/2013   HGB 13.3 03/02/2013   HCT 39.7 03/02/2013   MCV 92.8 03/02/2013   PLT 243 03/02/2013      Chemistry  Component Value Date/Time   NA 144 03/02/2013 1207   NA 138 12/18/2011 0440   K 4.9 03/02/2013 1207   K 4.0 12/18/2011 0440   CL 111* 08/04/2012 1008   CL 107 12/18/2011 0440   CO2 30* 03/02/2013 1207   CO2 22 12/18/2011 0440   BUN 13.6 03/02/2013 1207   BUN 17 12/18/2011 0440   CREATININE 0.7 03/02/2013 1207   CREATININE 0.50 12/18/2011 0440   CREATININE 0.50 12/12/2011 0000      Component Value Date/Time   CALCIUM 9.5 03/02/2013 1207   CALCIUM 8.7 12/18/2011 0440   ALKPHOS 89 03/02/2013 1207   AST 16 03/02/2013 1207   ALT 25 03/02/2013 1207   BILITOT 0.61 03/02/2013 1207       RADIOGRAPHIC STUDIES:  No results found.  ASSESSMENT: 67 year old female with  #1 WHO grade 4 glioblastoma multiforme. She is status post partial resection on 12/17/2011. She has completed Temodar and concurrent radiation therapy in December 2013.  #2 on 1/10/ 2014 through 03/18/2012 patient received adjuvant Temodar at 150 mg per meter squared. She has tolerated this dose very well. Because of good tolerance the patient was dose increased 200 mg per meter squared per [facility] recommendations.  However she had difficulty taking this cycle and her dose was subsequently reduced to 150 mg per meter squared for a total of 270 mg days one through 5.  Her tumor continues to be stable.    PLAN:  #1  Patient is doing well today, her  labs are stable, I reviewed them with her in detail.  Patient will proceed with scheduled cycle 13 of day 1 through 5 of Temodar beginning on 03/05/13 tentatively.   #2  She will return around 03/31/13 for labs and evaluation.    All questions were answered. The patient knows to call the clinic with any problems, questions or concerns. We can certainly see the patient much sooner if necessary.  I spent 15 minutes counseling the patient face to face. The total time spent in the appointment was 30 minutes.  Minette Headland, [place] [phone] 03/03/2013, 2:58 PM

## 2013-03-02 NOTE — Telephone Encounter (Signed)
, °

## 2013-03-03 ENCOUNTER — Other Ambulatory Visit: Payer: Self-pay | Admitting: Radiation Therapy

## 2013-03-03 ENCOUNTER — Other Ambulatory Visit: Payer: Self-pay | Admitting: Emergency Medicine

## 2013-03-03 ENCOUNTER — Telehealth: Payer: Self-pay | Admitting: *Deleted

## 2013-03-03 DIAGNOSIS — D496 Neoplasm of unspecified behavior of brain: Secondary | ICD-10-CM

## 2013-03-03 MED ORDER — TEMOZOLOMIDE 250 MG PO CAPS: 250.0000 mg | ORAL_CAPSULE | Freq: Every day | ORAL | Status: DC

## 2013-03-03 MED ORDER — TEMOZOLOMIDE 20 MG PO CAPS: 20.0000 mg | ORAL_CAPSULE | Freq: Every day | ORAL | Status: DC

## 2013-03-03 NOTE — Telephone Encounter (Signed)
Called pt to inform her of results of protein. Pt was not home , left message with pt's daughter. Daughter said she would give pt the message. Told her if patient had any questions to give me a call at [phone].

## 2013-03-06 ENCOUNTER — Encounter: Payer: Self-pay | Admitting: Oncology

## 2013-03-10 NOTE — Progress Notes (Signed)
CALLED [employer] [phone] AND SPOKE TO KIM RN.  THEY WILL CALL us BACK. SANDY CALLED AND SHE SAID IT HAS TO GO TO [facility] [place] [facility]. EBONY KNOWS.

## 2013-03-18 ENCOUNTER — Other Ambulatory Visit: Payer: Medicare Other

## 2013-03-18 ENCOUNTER — Ambulatory Visit: Payer: Medicare Other | Admitting: Adult Health

## 2013-03-27 ENCOUNTER — Encounter: Payer: Self-pay | Admitting: Radiation Oncology

## 2013-03-27 ENCOUNTER — Ambulatory Visit
Admission: RE | Admit: 2013-03-27 | Discharge: 2013-03-27 | Disposition: A | Discharge status: Home / Self Care | Payer: Medicare Other | Source: Ambulatory Visit | Attending: Radiation Oncology | Admitting: Radiation Oncology

## 2013-03-27 DIAGNOSIS — D496 Neoplasm of unspecified behavior of brain: Secondary | ICD-10-CM

## 2013-03-27 MED ORDER — GADOBENATE DIMEGLUMINE 529 MG/ML IV SOLN
14.0000 mL | Freq: Once | INTRAVENOUS | Status: AC | PRN
  Administered 2013-03-27: 14 mL via INTRAVENOUS

## 2013-03-31 ENCOUNTER — Other Ambulatory Visit (HOSPITAL_BASED_OUTPATIENT_CLINIC_OR_DEPARTMENT_OTHER): Payer: Medicare Other

## 2013-03-31 ENCOUNTER — Ambulatory Visit (HOSPITAL_BASED_OUTPATIENT_CLINIC_OR_DEPARTMENT_OTHER): Payer: Medicare Other | Admitting: Adult Health

## 2013-03-31 ENCOUNTER — Telehealth: Payer: Self-pay | Admitting: Radiation Oncology

## 2013-03-31 ENCOUNTER — Other Ambulatory Visit: Payer: Self-pay | Admitting: Emergency Medicine

## 2013-03-31 ENCOUNTER — Encounter: Payer: Self-pay | Admitting: Adult Health

## 2013-03-31 VITALS — BP 128/73 | HR 82 | Temp 98.7°F | Resp 18 | Ht 65.0 in | Wt 147.8 lb

## 2013-03-31 DIAGNOSIS — C719 Malignant neoplasm of brain, unspecified: Secondary | ICD-10-CM

## 2013-03-31 DIAGNOSIS — D496 Neoplasm of unspecified behavior of brain: Secondary | ICD-10-CM

## 2013-03-31 LAB — CBC WITH DIFFERENTIAL/PLATELET
BASO%: 0.7 % (ref 0.0–2.0)
BASOS ABS: 0 10*3/uL (ref 0.0–0.1)
EOS ABS: 0.1 10*3/uL (ref 0.0–0.5)
EOS%: 2 % (ref 0.0–7.0)
HCT: 40.9 % (ref 34.8–46.6)
HGB: 13.8 g/dL (ref 11.6–15.9)
LYMPH#: 0.6 10*3/uL — AB (ref 0.9–3.3)
LYMPH%: 14.4 % (ref 14.0–49.7)
MCH: 31.2 pg (ref 25.1–34.0)
MCHC: 33.7 g/dL (ref 31.5–36.0)
MCV: 92.6 fL (ref 79.5–101.0)
MONO#: 0.2 10*3/uL (ref 0.1–0.9)
MONO%: 4 % (ref 0.0–14.0)
NEUT%: 78.9 % — ABNORMAL HIGH (ref 38.4–76.8)
NEUTROS ABS: 3.2 10*3/uL (ref 1.5–6.5)
Platelets: 243 10*3/uL (ref 145–400)
RBC: 4.42 10*6/uL (ref 3.70–5.45)
RDW: 14.3 % (ref 11.2–14.5)
WBC: 4.1 10*3/uL (ref 3.9–10.3)

## 2013-03-31 LAB — COMPREHENSIVE METABOLIC PANEL (CC13)
ALBUMIN: 3.8 g/dL (ref 3.5–5.0)
ALT: 43 U/L (ref 0–55)
AST: 26 U/L (ref 5–34)
Alkaline Phosphatase: 103 U/L (ref 40–150)
Anion Gap: 9 mEq/L (ref 3–11)
BUN: 13.5 mg/dL (ref 7.0–26.0)
CHLORIDE: 112 meq/L — AB (ref 98–109)
CO2: 22 mEq/L (ref 22–29)
Calcium: 9.5 mg/dL (ref 8.4–10.4)
Creatinine: 0.7 mg/dL (ref 0.6–1.1)
Glucose: 104 mg/dl (ref 70–140)
POTASSIUM: 4.1 meq/L (ref 3.5–5.1)
SODIUM: 143 meq/L (ref 136–145)
TOTAL PROTEIN: 6.3 g/dL — AB (ref 6.4–8.3)
Total Bilirubin: 0.66 mg/dL (ref 0.20–1.20)

## 2013-03-31 NOTE — Telephone Encounter (Signed)
appts made and printed...td 

## 2013-03-31 NOTE — Telephone Encounter (Signed)
Phoned residence. Spoke with patient's husband. He confirmed the patient saw on MyChart that her exam remains stable, the area of focus is unchanged and there are no new findings. Encouraged to call with future needs. He verbalized understanding.

## 2013-03-31 NOTE — Progress Notes (Signed)
OFFICE PROGRESS NOTE  CC  [name],[name], MD [address], [address]  [place] [address] 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  PRIOR THERAPY:  #1 patient originally presented with 2-3 week loss of memory with trouble finding words. She had one episode of left parietal headache. Because of this she was seen by her internist an MRI of the brain was done on 12/13/2011 this showed a ring enhancing lesion in the left temporal lobe measuring 2.7 x 3.6 x 2.5 cm with 45 surrounding satellite lesions. She had a complete systemic workup including CT of the chest abdomen and pelvis that was negative. She went on to be seen by neurosurgery Dr. Luiz Ochoa and patient underwent subtotal resection of the dominant lesion on October 14. 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 She went on to see Dr. Fatima Sanger for 2 to who has been treating her she was also seen by [name].  #2 patient was begun on radiation therapy with concurrent Temodar at 75 mg per meter squared. She completed concurrent Temodar and radiation therapy in December 2013.  #3 patient was seen at [facility] as well as [facility]. [facility] has recommended the patient began adjuvant Temodar at 150 milligrams per meter squared she tolerates the first cycle do we could increase the dose to 200 mg per meter squared days one through 5 on a 28 day cycle.  #4 patient began adjuvant Temodar cycle 1 on 03/13/12 through 03/18/12 initially at 150 mg per meter squared. She tolerated this well. Then the recommendation was to increase the dose initially at a higher dose of 200 mg per meter squared. However she could not tolerate this. So the third cycle was reduced to 150 mg per meter squared days one through 5.  The plan is for the patient to receive 18 cycles of Temodar.    CURRENT THERAPY:patient will begin cycle #14 of Temodar days 1 through 5, at 150mg  per meter squared, 270mg .   INTERVAL HISTORY: [name] 67 y.o. female returns today for evaluation prior to her Temodar.  She had a repeat brain MRI on 03/27/13 which showed stable disease, and no progression.  She had f/u with Dr. Veryl Speak at the brain tumor center at [facility] yesterday, who was pleased with the results and would like her to continue Temodar with f/u with them in 2 months with brain MRI.  The patient continues to do well.  She does experience fatigue following chemotherapy.  But she denies fevers, chills, nausea, vomiting, constipation, diarrhea, headaches, numbness, vision changes, weakness, or any further concerns.     MEDICAL HISTORY: Past Medical History  Diagnosis Date  . PONV (postoperative nausea and vomiting)   . Depression   . GERD (gastroesophageal reflux disease)   . Headache(784.0)   . HLD (hyperlipidemia)   . Brain tumor 12/18/2011    Left temporal lobe 2.7 x 3.6 x 2.5 cm ring enhancing; numerous small surrounding satellite lesions 12/13/11  . 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 Hiatal hernia   . History of radiation therapy 01/07/2012-02/18/12    left temporal  58GY    ALLERGIES:  is allergic to codeine and compazine.  MEDICATIONS:  Current Outpatient Prescriptions  Medication Sig Dispense Refill  . aspirin EC 81 MG tablet Take 81 mg by mouth at bedtime.      Marland Kitchen  buPROPion (WELLBUTRIN XL) 300 MG 24 hr tablet Take 300 mg by mouth every morning.      . Calcium Carbonate-Vitamin D (CALCIUM + D PO) Take 1 tablet by mouth 2 (two) times daily.      . cholecalciferol (VITAMIN D) 1000 UNITS tablet Take 1,000 Units by mouth daily.      . clonazePAM (KLONOPIN) 0.5 MG tablet Take 0.5 mg by mouth at bedtime as needed.      [name] (ATIVAN) 1 MG tablet Take by mouth.      . Melatonin 3 MG CAPS Take 1 capsule (3 mg total) by mouth at bedtime as needed.     0  . Melatonin-Tryptophan 3-100 MG CAPS Take by mouth.      . Multiple Vitamin (MULTIVITAMIN WITH MINERALS) TABS Take 1 tablet by mouth daily.      . pantoprazole (PROTONIX) 40 MG tablet Take 40 mg by mouth daily.      . promethazine (PHENERGAN) 25 MG suppository Place 1 suppository (25 mg total) rectally every 6 (six) hours as needed for nausea.  20 each  3  . simvastatin (ZOCOR) 40 MG tablet Take 40 mg by mouth at bedtime.      . temozolomide (TEMODAR) 20 MG capsule Take 1 capsule (20 mg total) by mouth daily. May take on an empty stomach or at bedtime to decrease nausea & vomiting.  5 capsule  0  . temozolomide (TEMODAR) 250 MG capsule Take 1 capsule (250 mg total) by mouth daily. May take on an empty stomach or at bedtime to decrease nausea & vomiting.  5 capsule  0  . tretinoin (RETIN-A) 0.05 % cream Apply topically.      . ondansetron (ZOFRAN-ODT) 8 MG disintegrating tablet Take 1 tablet (8 mg total) by mouth every 8 (eight) hours as needed for nausea.  30 tablet  12   No current facility-administered medications for this visit.    SURGICAL HISTORY:  Past Surgical History  Procedure Laterality Date  . Total knee arthroplasty  2000    left  . Bunionectomy  2001    right  . Craniotomy  12/17/2011    Procedure: CRANIOTOMY TUMOR EXCISION;  Surgeon: Otilio Connors, MD;  Location: [place] [facility];  Service: Neurosurgery;  Laterality: Left;  LEFT temporal craniotomy with stealth for tumor resection  . Dilation and curettage of uterus  2007    REVIEW OF SYSTEMS:   A 10 point review of systems was conducted and is otherwise negative except for what is noted above.    PHYSICAL EXAMINATION: Blood pressure 128/73, pulse 82, temperature 98.7 F (37.1 C), temperature source Oral, resp. rate 18, height 5\' 5"  (1.651 m), weight 147 lb 12.8 oz (67.042 kg). Body mass index is 24.6 kg/(m^2). General: Patient is a well appearing female in no acute distress HEENT: PERRLA, sclerae anicteric no  conjunctival pallor, MMM Neck: supple, no palpable adenopathy Lungs: clear to auscultation bilaterally, no wheezes, rhonchi, or rales Cardiovascular: regular rate rhythm, S1, S2, no murmurs, rubs or gallops Abdomen: Soft, non-tender, non-distended, normoactive bowel sounds, no HSM Extremities: warm and well perfused, no clubbing, cyanosis, or edema Skin: No rashes or lesions Neuro: CN II-XII intact, left eye ptosis ECOG PERFORMANCE STATUS: 1 - Symptomatic but completely ambulatory   LABORATORY DATA: Lab Results  Component Value Date   WBC 4.1 03/31/2013   HGB 13.8 03/31/2013   HCT 40.9 03/31/2013   MCV 92.6 03/31/2013   PLT 243 03/31/2013  Chemistry      Component Value Date/Time   NA 144 03/02/2013 1207   NA 138 12/18/2011 0440   K 4.9 03/02/2013 1207   K 4.0 12/18/2011 0440   CL 111* 08/04/2012 1008   CL 107 12/18/2011 0440   CO2 30* 03/02/2013 1207   CO2 22 12/18/2011 0440   BUN 13.6 03/02/2013 1207   BUN 17 12/18/2011 0440   CREATININE 0.7 03/02/2013 1207   CREATININE 0.50 12/18/2011 0440   CREATININE 0.50 12/12/2011 0000      Component Value Date/Time   CALCIUM 9.5 03/02/2013 1207   CALCIUM 8.7 12/18/2011 0440   ALKPHOS 89 03/02/2013 1207   AST 16 03/02/2013 1207   ALT 25 03/02/2013 1207   BILITOT 0.61 03/02/2013 1207       RADIOGRAPHIC STUDIES:  No results found.  ASSESSMENT: 68 year old female with  #1 WHO grade 4 glioblastoma multiforme. She is status post partial resection on 12/17/2011. She has completed Temodar and concurrent radiation therapy in December 2013.  #2 on 1/10/ 2014 through 03/18/2012 patient received adjuvant Temodar at 150 mg per meter squared. She has tolerated this dose very well. Because of good tolerance the patient was dose increased 200 mg per meter squared per [facility] recommendations.  However she had difficulty taking this cycle and her dose was subsequently reduced to 150 mg per meter squared for a total of 270 mg days one through 5.   Her tumor continues to be stable.    PLAN:  #1  Patient is doing well.  Her MRI shows no progression.  She will proceed with cycle 14 of Temodar, 270mg  days 1-5 beginning on April 02, 2013.  She will then return for labs, and likely begin cycle 15 of temodar on 04/30/13.  Due to financial costs of appointments she would like a lab only appointment, and will only see [place] if necessary in February.   #2  She will return to see [place] in approximately two months for labs, and follow up prior to cycle 16 of Temodar.    All questions were answered. The patient knows to call the clinic with any problems, questions or concerns. We can certainly see the patient much sooner if necessary.  I spent 25 minutes counseling the patient face to face. The total time spent in the appointment was 30 minutes.  Minette Headland, [place] [phone] 03/31/2013, 11:14 AM

## 2013-03-31 NOTE — Telephone Encounter (Signed)
Message copied by Heywood Footman on Tue Mar 31, 2013  8:46 AM ------      Message from: [place], [place]      Created: Fri Mar 27, 2013  5:45 PM       The exam remains stable.  The focus of enhancement in the frontal lobe is unchanged, and there are no other new findings. ------

## 2013-04-01 ENCOUNTER — Ambulatory Visit
Admission: RE | Admit: 2013-04-01 | Discharge: 2013-04-01 | Disposition: A | Discharge status: Home / Self Care | Payer: Medicare Other | Source: Ambulatory Visit | Attending: Radiation Oncology | Admitting: Radiation Oncology

## 2013-04-01 ENCOUNTER — Encounter: Payer: Self-pay | Admitting: Radiation Oncology

## 2013-04-01 VITALS — BP 131/80 | HR 81 | Temp 98.0°F | Resp 16 | Wt 149.4 lb

## 2013-04-01 DIAGNOSIS — D496 Neoplasm of unspecified behavior of brain: Secondary | ICD-10-CM

## 2013-04-01 NOTE — Progress Notes (Signed)
Patient seen at [facility] on 1/26 unable to find a dictated note. However, patient reports they agreed the scan appeared the sam. Reports she is on the 14 month of 18 month plan of Temodar. Denies nausea or vomiting so long as she take zofran and phenergan suppository before taking Temodar. Scheduled to follow up at [facility] on March 23 and with Humphrey Rolls on March 24 therefore, she is hoping her MRI will be scheduled for March 19 or 20th. Denies fine motor changes, vision changes, or hearing changes. Denies headache or dizziness. Gait steady. Patient without complaints.

## 2013-04-01 NOTE — Progress Notes (Signed)
Radiation Oncology         [phone] ________________________________  Name: Kelly Maxwell MRN: [id number]  Date: 04/01/2013  DOB: 1945/07/02  Follow-Up Visit Note  CC: Wenda Low, MD  Wenda Low, MD  Diagnosis:   68 year old woman s/p partial resection of a multifocal left temporal grade IV gliosarcoma s/p radiotherapy to 60 Gy - 01/07/2012-02/18/2012   Interval Since Last Radiation:  13  months  Narrative:  The patient returns today for routine follow-up.  The recent films were presented in our multidisciplinary conference with neuroradiology just prior to the clinic. She has minimal symptoms and remains very active.  The only thing she notes is some fatigue on the last day of each Temodar cycle.                              ALLERGIES:  is allergic to codeine and compazine.  Meds: Current Outpatient Prescriptions  Medication Sig Dispense Refill  . aspirin EC 81 MG tablet Take 81 mg by mouth at bedtime.      Marland Kitchen buPROPion (WELLBUTRIN XL) 300 MG 24 hr tablet Take 300 mg by mouth every morning.      . Calcium Carbonate-Vitamin D (CALCIUM + D PO) Take 1 tablet by mouth 2 (two) times daily.      . cholecalciferol (VITAMIN D) 1000 UNITS tablet Take 1,000 Units by mouth daily.      . clonazePAM (KLONOPIN) 0.5 MG tablet Take 0.5 mg by mouth at bedtime as needed.      [name] (ATIVAN) 1 MG tablet Take by mouth.      . Melatonin 3 MG CAPS Take 1 capsule (3 mg total) by mouth at bedtime as needed.    0  . Melatonin-Tryptophan 3-100 MG CAPS Take by mouth.      . Multiple Vitamin (MULTIVITAMIN WITH MINERALS) TABS Take 1 tablet by mouth daily.      . ondansetron (ZOFRAN-ODT) 8 MG disintegrating tablet Take 1 tablet (8 mg total) by mouth every 8 (eight) hours as needed for nausea.  30 tablet  12  . pantoprazole (PROTONIX) 40 MG tablet Take 40 mg by mouth daily.      . promethazine (PHENERGAN) 25 MG suppository Place 1 suppository (25 mg total) rectally every 6 (six) hours as needed  for nausea.  20 each  3  . simvastatin (ZOCOR) 40 MG tablet Take 40 mg by mouth at bedtime.      . temozolomide (TEMODAR) 20 MG capsule Take 1 capsule (20 mg total) by mouth daily. May take on an empty stomach or at bedtime to decrease nausea & vomiting.  5 capsule  0  . temozolomide (TEMODAR) 250 MG capsule Take 1 capsule (250 mg total) by mouth daily. May take on an empty stomach or at bedtime to decrease nausea & vomiting.  5 capsule  0  . tretinoin (RETIN-A) 0.05 % cream Apply topically.       No current facility-administered medications for this encounter.    Physical Findings: The patient is in no acute distress. Patient is alert and oriented.  weight is 149 lb 6.4 oz (67.767 kg). Her oral temperature is 98 F (36.7 C). Her blood pressure is 131/80 and her pulse is 81. Her respiration is 16 and oxygen saturation is 100%. .  No significant changes.  Lab Findings: Lab Results  Component Value Date   WBC 4.1 03/31/2013   HGB 13.8 03/31/2013  HCT 40.9 03/31/2013   MCV 92.6 03/31/2013   PLT 243 03/31/2013   Radiographic Findings: Mr Jeri Cos ZO Contrast  03/27/2013   CLINICAL DATA:  Postop left temporal all tumor resection. Follow-up.  EXAM: MRI HEAD WITHOUT AND WITH CONTRAST  TECHNIQUE: Multiplanar, multiecho pulse sequences of the brain and surrounding structures were obtained without and with intravenous contrast.  CONTRAST:  61mL MULTIHANCE GADOBENATE DIMEGLUMINE 529 MG/ML IV SOLN  COMPARISON:  01/27/2013 and previous  FINDINGS: Postoperative changes in the left temporal lobe are stable. Regional gliosis is the same. No evidence of mass effect or enhancement.  Previously seen enhancing nodule in the left frontal white matter is unchanged measured at 3.3 x 5.7 mm. No new foci of enhancement.  No evidence of acute ischemic infarction. White matter changes are are the same. Mild dural enhancement is the same. No hydrocephalus. No extra-axial fluid collection. No pituitary mass. No inflammatory  sinus disease.  IMPRESSION: Stable examination. Postoperative and postradiation changes in the left temporal lobe. No evidence of residual or recurrent tumor in that region.  No change in a focus of enhancement in the left frontal white matter measuring 3.3 x 5.7 mm.   Electronically Signed   By: Nelson Chimes M.D.   On: 03/27/2013 12:52   Impression:  The patient has no evidence of active disease on MRI.  Plan:  Repeat MRI on 3/29 and see me on 3/25.  _____________________________________  Sheral Apley. Tammi Klippel, M.D.

## 2013-04-20 ENCOUNTER — Other Ambulatory Visit: Payer: Self-pay | Admitting: Radiation Therapy

## 2013-04-20 DIAGNOSIS — D496 Neoplasm of unspecified behavior of brain: Secondary | ICD-10-CM

## 2013-04-23 ENCOUNTER — Ambulatory Visit (INDEPENDENT_AMBULATORY_CARE_PROVIDER_SITE_OTHER): Payer: Self-pay | Admitting: General Surgery

## 2013-04-28 ENCOUNTER — Other Ambulatory Visit: Payer: Medicare Other

## 2013-04-28 ENCOUNTER — Telehealth: Payer: Self-pay | Admitting: *Deleted

## 2013-04-28 NOTE — Telephone Encounter (Signed)
Pt called stating that she needed to cancel her appt for labs today due to the weather. She rs for 04/30/13@ 11am. Pt is aware...td

## 2013-04-29 ENCOUNTER — Ambulatory Visit (HOSPITAL_BASED_OUTPATIENT_CLINIC_OR_DEPARTMENT_OTHER): Payer: Medicare Other

## 2013-04-29 DIAGNOSIS — D496 Neoplasm of unspecified behavior of brain: Secondary | ICD-10-CM

## 2013-04-29 DIAGNOSIS — C712 Malignant neoplasm of temporal lobe: Secondary | ICD-10-CM

## 2013-04-29 LAB — COMPREHENSIVE METABOLIC PANEL (CC13)
ALBUMIN: 3.7 g/dL (ref 3.5–5.0)
ALK PHOS: 96 U/L (ref 40–150)
ALT: 29 U/L (ref 0–55)
AST: 19 U/L (ref 5–34)
Anion Gap: 8 mEq/L (ref 3–11)
BILIRUBIN TOTAL: 0.55 mg/dL (ref 0.20–1.20)
BUN: 14.6 mg/dL (ref 7.0–26.0)
CO2: 28 mEq/L (ref 22–29)
Calcium: 9.5 mg/dL (ref 8.4–10.4)
Chloride: 108 mEq/L (ref 98–109)
Creatinine: 0.6 mg/dL (ref 0.6–1.1)
Glucose: 90 mg/dl (ref 70–140)
POTASSIUM: 4.4 meq/L (ref 3.5–5.1)
Sodium: 143 mEq/L (ref 136–145)
Total Protein: 6.3 g/dL — ABNORMAL LOW (ref 6.4–8.3)

## 2013-04-29 LAB — CBC WITH DIFFERENTIAL/PLATELET
BASO%: 0.8 % (ref 0.0–2.0)
Basophils Absolute: 0 10*3/uL (ref 0.0–0.1)
EOS%: 2.3 % (ref 0.0–7.0)
Eosinophils Absolute: 0.1 10*3/uL (ref 0.0–0.5)
HCT: 41 % (ref 34.8–46.6)
HGB: 13.4 g/dL (ref 11.6–15.9)
LYMPH%: 17.4 % (ref 14.0–49.7)
MCH: 30.6 pg (ref 25.1–34.0)
MCHC: 32.8 g/dL (ref 31.5–36.0)
MCV: 93.3 fL (ref 79.5–101.0)
MONO#: 0.3 10*3/uL (ref 0.1–0.9)
MONO%: 6.7 % (ref 0.0–14.0)
NEUT%: 72.8 % (ref 38.4–76.8)
NEUTROS ABS: 3.2 10*3/uL (ref 1.5–6.5)
Platelets: 243 10*3/uL (ref 145–400)
RBC: 4.39 10*6/uL (ref 3.70–5.45)
RDW: 14.3 % (ref 11.2–14.5)
WBC: 4.4 10*3/uL (ref 3.9–10.3)
lymph#: 0.8 10*3/uL — ABNORMAL LOW (ref 0.9–3.3)

## 2013-04-30 ENCOUNTER — Other Ambulatory Visit: Payer: Medicare Other

## 2013-05-13 ENCOUNTER — Encounter (INDEPENDENT_AMBULATORY_CARE_PROVIDER_SITE_OTHER): Payer: Self-pay | Admitting: General Surgery

## 2013-05-13 ENCOUNTER — Ambulatory Visit (INDEPENDENT_AMBULATORY_CARE_PROVIDER_SITE_OTHER): Payer: Medicare Other | Admitting: General Surgery

## 2013-05-13 VITALS — BP 130/84 | HR 80 | Temp 98.3°F | Resp 16 | Ht 64.0 in | Wt 152.8 lb

## 2013-05-13 DIAGNOSIS — K439 Ventral hernia without obstruction or gangrene: Secondary | ICD-10-CM

## 2013-05-13 HISTORY — DX: Ventral hernia without obstruction or gangrene: K43.9

## 2013-05-13 NOTE — Patient Instructions (Signed)
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 It can be repaired as we discussed.  Please call us and let us know if you would or would not like to have it repaired at this time.

## 2013-05-13 NOTE — Progress Notes (Signed)
Patient ID: Kelly Maxwell, female   DOB: 1945/08/09, 68 y.o.   MRN: [id number]  Chief Complaint  Patient presents with  . New Evaluation    eval ventral hernia    HPI Kelly Maxwell is a 68 y.o. female.   HPI  She is referred by Dr. Lysle Rubens for further evaluation and treatment of a left lateral abdominal wall hernia.  She has known about this for a number of years. Recently she thinks has been bulging out more although it's been better over the past 2 weeks. She walks a lot and it had began becoming uncomfortable during her walks. No obstructive symptoms. This was noted on a CT scan from October 2013. It contained fatty tissue only at that time.  She is currently under treatment for glioblastoma multiforme. She is taking Temodar. She has monthly MRIs.  Past Medical History  Diagnosis Date  . PONV (postoperative nausea and vomiting)   . Depression   . GERD (gastroesophageal reflux disease)   . Headache(784.0)   . HLD (hyperlipidemia)   . Brain tumor 12/18/2011    Left temporal lobe 2.7 x 3.6 x 2.5 cm ring enhancing; numerous small surrounding satellite lesions 12/13/11  . 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 Hiatal hernia   . History of radiation therapy 01/07/2012-02/18/12    left temporal  58GY  . Cancer     Past Surgical History  Procedure Laterality Date  . Total knee arthroplasty  2000    left  . Bunionectomy  2001    right  . Craniotomy  12/17/2011    Procedure: CRANIOTOMY TUMOR EXCISION;  Surgeon: Otilio Connors, MD;  Location: [place] [facility];  Service: Neurosurgery;  Laterality: Left;  LEFT temporal craniotomy with stealth for tumor resection  . Dilation and curettage of uterus  2007    Family History  Problem Relation Age of Onset  . Heart disease Mother     Social History History  Substance Use Topics  . Smoking status: Never Smoker   . Smokeless tobacco: Never Used  . Alcohol Use: Not on file    Allergies  Allergen Reactions  .  Codeine Nausea And Vomiting and Nausea Only  . Compazine [Prochlorperazine Edisylate] Other (See Comments) and Rash    Became hyper Became hyper    Current Outpatient Prescriptions  Medication Sig Dispense Refill  . buPROPion (WELLBUTRIN XL) 300 MG 24 hr tablet Take 300 mg by mouth every morning.      . Calcium Carbonate-Vitamin D (CALCIUM + D PO) Take 1 tablet by mouth 2 (two) times daily.      . cholecalciferol (VITAMIN D) 1000 UNITS tablet Take 1,000 Units by mouth daily.      . clonazePAM (KLONOPIN) 0.5 MG tablet Take 0.5 mg by mouth at bedtime as needed.      [name] (ATIVAN) 1 MG tablet Take by mouth.      . Melatonin 3 MG CAPS Take 1 capsule (3 mg total) by mouth at bedtime as needed.    0  . Melatonin-Tryptophan 3-100 MG CAPS Take by mouth.      . Multiple Vitamin (MULTIVITAMIN WITH MINERALS) TABS Take 1 tablet by mouth daily.      . ondansetron (ZOFRAN-ODT) 8 MG disintegrating tablet Take 1 tablet (8 mg total) by mouth every 8 (eight) hours as needed for nausea.  30 tablet  12  . pantoprazole (PROTONIX) 40 MG tablet Take 40 mg by  mouth daily.      . promethazine (PHENERGAN) 25 MG suppository Place 1 suppository (25 mg total) rectally every 6 (six) hours as needed for nausea.  20 each  3  . simvastatin (ZOCOR) 40 MG tablet Take 40 mg by mouth at bedtime.      . temozolomide (TEMODAR) 20 MG capsule Take 1 capsule (20 mg total) by mouth daily. May take on an empty stomach or at bedtime to decrease nausea & vomiting.  5 capsule  0  . temozolomide (TEMODAR) 250 MG capsule Take 1 capsule (250 mg total) by mouth daily. May take on an empty stomach or at bedtime to decrease nausea & vomiting.  5 capsule  0  . tretinoin (RETIN-A) 0.05 % cream Apply topically.       No current facility-administered medications for this visit.    Review of Systems Review of Systems  Constitutional: Negative.   Respiratory: Negative.   Cardiovascular: Negative.   Gastrointestinal: Positive for  abdominal pain (Occasional at site of hernia).  Genitourinary: Negative.     Blood pressure 130/84, pulse 80, temperature 98.3 F (36.8 C), resp. rate 16, height 5\' 4"  (1.626 m), weight 152 lb 12.8 oz (69.31 kg).  Physical Exam Physical Exam  Constitutional: She appears well-developed and well-nourished. No distress.  HENT:  Head: Normocephalic and atraumatic.  Eyes: EOM are normal. No scleral icterus.  Cardiovascular: Normal rate and regular rhythm.   Pulmonary/Chest: Effort normal and breath sounds normal.  Abdominal: Soft. She exhibits no distension. There is no tenderness.  Left lateral abdominal wall bulge that is reducible in the supine position.  Neurological: She is alert.  Skin: Skin is warm and dry.  Psychiatric: She has a normal mood and affect. Her behavior is normal.    Data Reviewed Note from Dr. Lysle Rubens.  CT scan from 12/2011.  Assessment    1. Left spigelian hernia-mildly symptomatic. No obstructive symptoms.  2. Glioblastoma multiforme.  Currently on an oral agent.     Plan    We discussed repair of the hernia with mesh. I recommended a laparoscopic approach. She's not sure that she wants to proceed with this at this time.  I have discussed the procedure, risks, and aftercare. Risks include but are not limited to bleeding, infection, wound healing problems, anesthesia, recurrence, accidental injury to intra-abdominal organs-such as intestine, liver, spleen, bladder, etc. We also discussed the rare complication of mesh rejection. All questions were answered.  She will call back if she wants to schedule the surgery        [name] 05/13/2013, 10:52 AM

## 2013-05-22 ENCOUNTER — Telehealth: Payer: Self-pay | Admitting: Radiation Oncology

## 2013-05-22 ENCOUNTER — Encounter: Payer: Self-pay | Admitting: Radiation Oncology

## 2013-05-22 ENCOUNTER — Ambulatory Visit
Admission: RE | Admit: 2013-05-22 | Discharge: 2013-05-22 | Disposition: A | Discharge status: Home / Self Care | Payer: Medicare Other | Source: Ambulatory Visit | Attending: Radiation Oncology | Admitting: Radiation Oncology

## 2013-05-22 DIAGNOSIS — D496 Neoplasm of unspecified behavior of brain: Secondary | ICD-10-CM

## 2013-05-22 MED ORDER — GADOBENATE DIMEGLUMINE 529 MG/ML IV SOLN
14.0000 mL | Freq: Once | INTRAVENOUS | Status: AC | PRN
  Administered 2013-05-22: 14 mL via INTRAVENOUS

## 2013-05-22 NOTE — Telephone Encounter (Signed)
Attempted to reach patient via telephone to reassure her that her email message request had been received. No answer. Left message. Will attempt to reach patient again on Monday with MRI results.

## 2013-05-26 ENCOUNTER — Telehealth: Payer: Self-pay | Admitting: Radiation Oncology

## 2013-05-26 ENCOUNTER — Other Ambulatory Visit (HOSPITAL_BASED_OUTPATIENT_CLINIC_OR_DEPARTMENT_OTHER): Payer: Medicare Other

## 2013-05-26 ENCOUNTER — Encounter: Payer: Self-pay | Admitting: Oncology

## 2013-05-26 ENCOUNTER — Telehealth: Payer: Self-pay | Admitting: Oncology

## 2013-05-26 ENCOUNTER — Ambulatory Visit (HOSPITAL_BASED_OUTPATIENT_CLINIC_OR_DEPARTMENT_OTHER): Payer: Medicare Other | Admitting: Oncology

## 2013-05-26 VITALS — BP 131/82 | HR 80 | Temp 98.3°F | Resp 18 | Ht 64.0 in | Wt 153.1 lb

## 2013-05-26 DIAGNOSIS — C712 Malignant neoplasm of temporal lobe: Secondary | ICD-10-CM

## 2013-05-26 DIAGNOSIS — D496 Neoplasm of unspecified behavior of brain: Secondary | ICD-10-CM

## 2013-05-26 LAB — CBC WITH DIFFERENTIAL/PLATELET
BASO%: 0.7 % (ref 0.0–2.0)
Basophils Absolute: 0 10*3/uL (ref 0.0–0.1)
EOS ABS: 0.1 10*3/uL (ref 0.0–0.5)
EOS%: 2.9 % (ref 0.0–7.0)
HEMATOCRIT: 41.6 % (ref 34.8–46.6)
HGB: 13.8 g/dL (ref 11.6–15.9)
LYMPH%: 11.6 % — AB (ref 14.0–49.7)
MCH: 30.7 pg (ref 25.1–34.0)
MCHC: 33.1 g/dL (ref 31.5–36.0)
MCV: 92.8 fL (ref 79.5–101.0)
MONO#: 0.2 10*3/uL (ref 0.1–0.9)
MONO%: 5.2 % (ref 0.0–14.0)
NEUT#: 3.1 10*3/uL (ref 1.5–6.5)
NEUT%: 79.6 % — ABNORMAL HIGH (ref 38.4–76.8)
Platelets: 235 10*3/uL (ref 145–400)
RBC: 4.48 10*6/uL (ref 3.70–5.45)
RDW: 13.9 % (ref 11.2–14.5)
WBC: 3.9 10*3/uL (ref 3.9–10.3)
lymph#: 0.5 10*3/uL — ABNORMAL LOW (ref 0.9–3.3)

## 2013-05-26 LAB — COMPREHENSIVE METABOLIC PANEL (CC13)
ALT: 34 U/L (ref 0–55)
ANION GAP: 10 meq/L (ref 3–11)
AST: 24 U/L (ref 5–34)
Albumin: 3.9 g/dL (ref 3.5–5.0)
Alkaline Phosphatase: 87 U/L (ref 40–150)
BILIRUBIN TOTAL: 0.59 mg/dL (ref 0.20–1.20)
BUN: 17 mg/dL (ref 7.0–26.0)
CO2: 24 meq/L (ref 22–29)
Calcium: 9.7 mg/dL (ref 8.4–10.4)
Chloride: 112 mEq/L — ABNORMAL HIGH (ref 98–109)
Creatinine: 0.7 mg/dL (ref 0.6–1.1)
GLUCOSE: 75 mg/dL (ref 70–140)
Potassium: 4.1 mEq/L (ref 3.5–5.1)
SODIUM: 146 meq/L — AB (ref 136–145)
TOTAL PROTEIN: 6.5 g/dL (ref 6.4–8.3)

## 2013-05-26 MED ORDER — BUPROPION HCL ER (XL) 300 MG PO TB24: 300.0000 mg | ORAL_TABLET | ORAL | Status: DC

## 2013-05-26 MED ORDER — CALCIUM CARBONATE-VITAMIN D 250-125 MG-UNIT PO TABS: 1.0000 | ORAL_TABLET | Freq: Two times a day (BID) | ORAL | Status: DC

## 2013-05-26 NOTE — Addendum Note (Signed)
Addended by: Amelia Jo I on: 05/26/2013 12:04 PM   Modules accepted: Orders, Medications

## 2013-05-26 NOTE — Patient Instructions (Signed)
Proceed with cycle 16 of Temodar 3/26 - 3/30  We will see you back on 06/24/13 for follow up prior to starting cycle 17

## 2013-05-26 NOTE — Telephone Encounter (Signed)
, °

## 2013-05-26 NOTE — Telephone Encounter (Signed)
Patient sent email requesting MRI results. Unable to reach patient via phone after receiving email. Phoned patient again today. No answer. Left message.

## 2013-05-26 NOTE — Progress Notes (Signed)
[place] OFFICE PROGRESS NOTE  Patient Care Team: Wenda Low, MD as PCP - General (Internal Medicine) Deatra Robinson, MD (Hematology and Oncology) 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   SUMMARY OF ONCOLOGIC HISTORY: #1 patient originally presented with 2-3 week loss of memory with trouble finding words. She had one episode of left parietal headache. Because of this she was seen by her internist an MRI of the brain was done on 12/13/2011 this showed a ring enhancing lesion in the left temporal lobe measuring 2.7 x 3.6 x 2.5 cm with 45 surrounding satellite lesions. She had a complete systemic workup including CT of the chest abdomen and pelvis that was negative. She went on to be seen by neurosurgery Dr. Luiz Ochoa and patient underwent subtotal resection of the dominant lesion on October 14. 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 She went on to see Dr. Fatima Sanger for 2 to who has been treating her she was also seen by [name].  #2 patient was begun on radiation therapy with concurrent Temodar at 75 mg per meter squared. She completed concurrent Temodar and radiation therapy in December 2013.  #3 patient was seen at [facility] as well as [facility]. [facility] has recommended the patient began adjuvant Temodar at 150 milligrams per meter squared she tolerates the first cycle do we could increase the dose to 200 mg per meter squared days one through 5 on a 28 day cycle.  #4 patient began adjuvant Temodar cycle 1 on 03/13/12 through 03/18/12 initially at 150 mg per meter squared. She tolerated this well. Then the recommendation was to increase the dose initially at a higher dose of 200 mg per meter squared. However she could not tolerate this. So the third cycle was reduced to 150 mg per meter  squared days one through 5.    CURRENT THERAPY:patient will begin cycle #16 of Temodar days 1 through 5 , at 150mg  per meter squared, 270mg .    INTERVAL HISTORY: [name] 68 y.o. female returns for followup visit today. She was recently seen at [facility] (05/25/2013) by her oncologist there. They're very pleased with the patient is doing. They have recommended continuation of Temodar. Today she will proceed with cycle 16 on 05/28/2013 through 06/01/2013. Her doses 150 mg per meter squared. She has all of the prescriptions. Patient is also set up to be seen by [name] on 05/27/2013. Her MRIs are being done every 2 months now appear  I have reviewed the past medical history, past surgical history, social history and family history with the patient and they are unchanged from previous note.  ALLERGIES:  is allergic to codeine and compazine.  MEDICATIONS:  No current outpatient prescriptions on file.   No current facility-administered medications for this visit.    REVIEW OF SYSTEMS:   Constitutional: Denies fevers, chills or abnormal weight loss Eyes: Denies blurriness of vision Ears, nose, mouth, throat, and face: Denies mucositis or sore throat Respiratory: Denies cough, dyspnea or wheezes Cardiovascular: Denies palpitation, chest discomfort or lower extremity swelling Gastrointestinal:  Denies nausea, heartburn or change in bowel habits Skin: Denies abnormal skin rashes Lymphatics: Denies new lymphadenopathy or easy bruising Neurological:Denies numbness, tingling or new weaknesses Behavioral/Psych: Mood is stable, no new changes  All other systems were reviewed with the patient and are negative.  PHYSICAL EXAMINATION: ECOG PERFORMANCE STATUS: 1 - Symptomatic but completely ambulatory  Filed Vitals:   05/26/13 0931  BP: 131/82  Pulse: 80  Temp: 98.3 F (36.8 C)  Resp: 18   Filed Weights   05/26/13 0931  Weight: 153 lb 1.6 oz (69.446 kg)     GENERAL:alert, no distress and comfortable SKIN: skin color, texture, turgor are normal, no rashes or significant lesions EYES: normal, Conjunctiva are pink and non-injected, sclera clear OROPHARYNX:no exudate, no erythema and lips, buccal mucosa, and tongue normal  NECK: supple, thyroid normal size, non-tender, without nodularity LYMPH:  no palpable lymphadenopathy in the cervical, axillary or inguinal LUNGS: clear to auscultation and percussion with normal breathing effort HEART: regular rate & rhythm and no murmurs and no lower extremity edema ABDOMEN:abdomen soft, non-tender and normal bowel sounds Musculoskeletal:no cyanosis of digits and no clubbing  NEURO: alert & oriented x 3 with fluent speech, no focal motor/sensory deficits  LABORATORY DATA:  I have reviewed the data as listed    Component Value Date/Time   NA 143 04/29/2013 1442   NA 138 12/18/2011 0440   K 4.4 04/29/2013 1442   K 4.0 12/18/2011 0440   CL 111* 08/04/2012 1008   CL 107 12/18/2011 0440   CO2 28 04/29/2013 1442   CO2 22 12/18/2011 0440   GLUCOSE 90 04/29/2013 1442   GLUCOSE 86 08/04/2012 1008   GLUCOSE 131* 12/18/2011 0440   BUN 14.6 04/29/2013 1442   BUN 17 12/18/2011 0440   CREATININE 0.6 04/29/2013 1442   CREATININE 0.50 12/18/2011 0440   CREATININE 0.50 12/12/2011 0000   CALCIUM 9.5 04/29/2013 1442   CALCIUM 8.7 12/18/2011 0440   PROT 6.3* 04/29/2013 1442   ALBUMIN 3.7 04/29/2013 1442   AST 19 04/29/2013 1442   ALT 29 04/29/2013 1442   ALKPHOS 96 04/29/2013 1442   BILITOT 0.55 04/29/2013 1442   GFRNONAA >90 12/18/2011 0440   GFRAA >90 12/18/2011 0440    No results found for this basename: SPEP, UPEP,  kappa and lambda light chains    Lab Results  Component Value Date   WBC 3.9 05/26/2013   NEUTROABS 3.1 05/26/2013   HGB 13.8 05/26/2013   HCT 41.6 05/26/2013   MCV 92.8 05/26/2013   PLT 235 05/26/2013      Chemistry      Component Value Date/Time   NA 143 04/29/2013 1442   NA 138 12/18/2011 0440    K 4.4 04/29/2013 1442   K 4.0 12/18/2011 0440   CL 111* 08/04/2012 1008   CL 107 12/18/2011 0440   CO2 28 04/29/2013 1442   CO2 22 12/18/2011 0440   BUN 14.6 04/29/2013 1442   BUN 17 12/18/2011 0440   CREATININE 0.6 04/29/2013 1442   CREATININE 0.50 12/18/2011 0440   CREATININE 0.50 12/12/2011 0000      Component Value Date/Time   CALCIUM 9.5 04/29/2013 1442   CALCIUM 8.7 12/18/2011 0440   ALKPHOS 96 04/29/2013 1442   AST 19 04/29/2013 1442   ALT 29 04/29/2013 1442   BILITOT 0.55 04/29/2013 1442       RADIOGRAPHIC STUDIES: I have personally reviewed the radiological images as listed and agreed with the findings in the report. No results found.    ASSESSMENT & PLAN: 68 year old female with  #1 WHO grade 4 glioblastoma multiform a set status post resection 12/17/2011. She then completed Temodar and concurrent radiation therapy in December 2013. She has been on adjuvant Temodar 150 mg per meter squared starting  03/14/2013. Thus far she has completed 15 cycles. She will proceed with cycle #16. It is my understanding from reading notes from her [place] oncologist the patient is gong to at least do 18 cycles. Her # 17 cycle will be due on 06/25/2013 through 06/29/2013. #18 will be due 07/23/2013 through 07/27/2013. She is gong to be followed up at [facility] in about 7 weeks time. Patient has all of her prescriptions. They know to call in for refills.  #2 patient will have MRIs of the brain performed every 2 months per protocol.  #3 I will plan on seeing her back prior to cycle 17.   No orders of the defined types were placed in this encounter.   All questions were answered. The patient knows to call the clinic with any problems, questions or concerns. No barriers to learning was detected. I spent 20 minutes counseling the patient face to face. The total time spent in the appointment was 30 minutes and more than 50% was on counseling and review of test results and coordination of care.     Marcy Panning, MD 05/26/2013 10:00 AM

## 2013-05-27 ENCOUNTER — Ambulatory Visit
Admission: RE | Admit: 2013-05-27 | Discharge: 2013-05-27 | Disposition: A | Discharge status: Home / Self Care | Payer: Medicare Other | Source: Ambulatory Visit | Attending: Radiation Oncology | Admitting: Radiation Oncology

## 2013-05-27 ENCOUNTER — Encounter: Payer: Self-pay | Admitting: Radiation Oncology

## 2013-05-27 VITALS — BP 140/81 | HR 82 | Resp 16 | Wt 152.6 lb

## 2013-05-27 DIAGNOSIS — D496 Neoplasm of unspecified behavior of brain: Secondary | ICD-10-CM

## 2013-05-27 NOTE — Progress Notes (Signed)
Denies fine motor changes, vision changes, or hearing changes. Denies headache or dizziness. Gait steady. Patient without complaints. Reports that she continues Temodar as directed. Denies nausea or vomiting. Drooping left eye noted. Weight stable.

## 2013-05-27 NOTE — Progress Notes (Signed)
Radiation Oncology         [phone] ________________________________  Name: Kelly Maxwell  MRN: [id number]  Date: 05/27/2013  DOB: 09-26-1945   Follow-Up Visit Note  CC: Wenda Low, MD  Wenda Low, MD  Diagnosis:   68 year old woman s/p partial resection of a multifocal left temporal grade IV gliosarcoma s/p radiotherapy to 60 Gy - 01/07/2012-02/18/2012   Interval Since Last Radiation:  15  months  Narrative:  The patient returns today for routine follow-up.  The recent films were presented in our multidisciplinary conference with neuroradiology just prior to the clinic.  Her enhancing lesion is stable on maintenance.                              ALLERGIES:  is allergic to codeine and compazine.  Meds: Current Outpatient Prescriptions  Medication Sig Dispense Refill  . buPROPion (WELLBUTRIN XL) 300 MG 24 hr tablet Take 1 tablet (300 mg total) by mouth every morning.      . calcium-vitamin D (CALCIUM + D) 250-125 MG-UNIT per tablet Take 1 tablet by mouth 2 (two) times daily.      . cholecalciferol (VITAMIN D) 1000 UNITS tablet Take 1,000 Units by mouth daily.      . clonazePAM (KLONOPIN) 0.5 MG tablet Take 0.5 mg by mouth at bedtime as needed for anxiety.      [name] (ATIVAN) 1 MG tablet Take 1 mg by mouth every 6 (six) hours as needed for anxiety.      . Melatonin 1 MG CAPS Take 3 capsules by mouth at bedtime as needed.       . Multiple Vitamin (MULTIVITAMIN) tablet Take 1 tablet by mouth daily.      . ondansetron (ZOFRAN-ODT) 8 MG disintegrating tablet Take 8 mg by mouth every 8 (eight) hours as needed for nausea.      . pantoprazole (PROTONIX) 40 MG tablet Take 40 mg by mouth daily.      . promethazine (PHENERGAN) 25 MG suppository Place 25 mg rectally every 6 (six) hours as needed for nausea.      . simvastatin (ZOCOR) 40 MG tablet Take 40 mg by mouth daily at 6 PM.      . temozolomide (TEMODAR) 20 MG capsule Take 20 mg by mouth daily. May take on an empty stomach  or at bedtime to decrease nausea & vomiting.      . temozolomide (TEMODAR) 250 MG capsule Take 250 mg by mouth daily. May take on an empty stomach or at bedtime to decrease nausea & vomiting.      . tretinoin (RETIN-A) 0.05 % cream Apply topically at bedtime.       No current facility-administered medications for this encounter.    Physical Findings: The patient is in no acute distress. Patient is alert and oriented.  vitals were not taken for this visit..  No significant changes.  Lab Findings: Lab Results  Component Value Date   WBC 3.9 05/26/2013   HGB 13.8 05/26/2013   HCT 41.6 05/26/2013   MCV 92.8 05/26/2013   PLT 235 05/26/2013    @LASTCHEM @  Radiographic Findings: Mr Jeri Cos MW Contrast  05/22/2013   CLINICAL DATA:  S RS staging. Left temporal mass resection. Radiation completed.  EXAM: MRI HEAD WITHOUT AND WITH CONTRAST  TECHNIQUE: Multiplanar, multiecho pulse sequences of the brain and surrounding structures were obtained without and with intravenous contrast.  CONTRAST:  5mL MULTIHANCE GADOBENATE  DIMEGLUMINE 529 MG/ML IV SOLN  COMPARISON:  03/27/2013 and multiple previous  FINDINGS: There is no change since previous examination. Changes of previous left temporal resection are the same. Regional gliosis is unchanged. No evidence of developing mass effect or enhancement.  The previously seen enhancing focus within the left frontal white matter is unchanged, measured at 5.2 x 3.0 mm today. No new areas of enhancement. Small venous angioma in that region is unchanged.  No evidence of acute infarction. Chronic white matter signal throughout both hemispheres is the same. Mild dural enhancement is the same. No hydrocephalus, extra-axial fluid collection, pituitary mass or inflammatory sinus disease. No skull or skullbase lesion.  IMPRESSION: Stable examination. 3 x 5 mm enhancing focus in the left frontal white matter is stable. No new abnormality.   Electronically Signed   By: Nelson Chimes  M.D.   On: 05/22/2013 14:05    Impression:  The patient has no evidence of active disease.  Plan:  MRI in 2 months, then follow-up.   _____________________________________  Sheral Apley. Tammi Klippel, M.D.

## 2013-05-29 ENCOUNTER — Encounter: Payer: Self-pay | Admitting: *Deleted

## 2013-05-29 ENCOUNTER — Other Ambulatory Visit: Payer: Self-pay | Admitting: Hematology & Oncology

## 2013-05-29 ENCOUNTER — Encounter: Payer: Self-pay | Admitting: Oncology

## 2013-05-29 NOTE — Progress Notes (Signed)
Faxed ondansetron pa form to [employer] °

## 2013-05-29 NOTE — Progress Notes (Signed)
RECEIVED A FAX FROM [employer] CONCERNING A PRIOR AUTHORIZATION FOR ONDANSETRON. THIS REQUEST WAS GIVEN TO MANAGED CARE.

## 2013-06-01 ENCOUNTER — Telehealth: Payer: Self-pay | Admitting: *Deleted

## 2013-06-01 NOTE — Telephone Encounter (Signed)
Message from pt's husband requesting prior authorization to be completed for Phenergan suppositories. Request forwarded to managed care to follow up.

## 2013-06-02 ENCOUNTER — Encounter: Payer: Self-pay | Admitting: Oncology

## 2013-06-02 NOTE — Progress Notes (Signed)
Faxed phenergan suppository pa form to [employer]

## 2013-06-05 ENCOUNTER — Telehealth: Payer: Self-pay | Admitting: *Deleted

## 2013-06-05 NOTE — Telephone Encounter (Signed)
CALLED [facility] CONCERNING THE PRIOR AUTHORIZATION FOR THE PHENERGAN SUPPOSITORY. THE PHARMACIST TRIED TO PROCESS THE PHENERGAN SUPPOSITORY. IT WAS APPROVED WITH AN $80 CO PAY. THE PHARMACIST WILL NOTIFY PT.

## 2013-06-05 NOTE — Telephone Encounter (Signed)
        ','<  More Detail >>       Non-Urgent Medical Question    Kelly Maxwell        Sent: Tue June 02, 2013 5:17 PM    To: Nobie Putnam Nurse Cc                   Message     Dr. Humphrey Rolls: Can you please send a refill of my [facility] suppository to [facility] on [address]. Thank you, Lorenz Coaster d/o/b 09-04-1945                   Select Font Size     Small Medium Large Extra Extra Large                Kelly Maxwell Description: 67 year old female  06/02/2013 Patient Email Provider: Deatra Robinson, MD  MRN: [id number] Department: [facility]                 Call Documentation     No notes of this type exist for this encounter.             Encounter MyChart Messages     Read Composed From To Subject    Y 06/02/2013 5:17 PM Charolett Bumpers, MD Non-Urgent Medical Question              Created by     Generic Mychart on 06/02/2013 05:17 PM

## 2013-06-16 ENCOUNTER — Other Ambulatory Visit: Payer: Self-pay

## 2013-06-16 ENCOUNTER — Other Ambulatory Visit: Payer: Self-pay | Admitting: Radiation Therapy

## 2013-06-16 DIAGNOSIS — D496 Neoplasm of unspecified behavior of brain: Secondary | ICD-10-CM

## 2013-06-16 MED ORDER — TEMOZOLOMIDE 250 MG PO CAPS: 250.0000 mg | ORAL_CAPSULE | Freq: Every day | ORAL | Status: DC

## 2013-06-16 MED ORDER — TEMOZOLOMIDE 20 MG PO CAPS: 20.0000 mg | ORAL_CAPSULE | Freq: Every day | ORAL | Status: DC

## 2013-06-16 NOTE — Telephone Encounter (Signed)
Cycle 17 Temodar prescription requested by patient - needs to be faxed to [facility].  Sent 20 mg [facility]ar [facility] in error - prescription cancelled with pharmacy [facility] [place]).  20 mg Temodar reprinted.  MD signature and faxed to [facility] 2 prescriptions - 20 mg Temodar Qty 5, R0 and 250 mg Temodar Qty 5, R0.  Sent to scan.

## 2013-06-17 NOTE — Telephone Encounter (Signed)
Fax failed 4/14.  Called [facility] [phone] for correct fax no [phone].  Faxed successfully and sent to scan.  Notified pt scrip had been sent - pt voiced understanding.  Sent scrip only for cycle 17.  Cycle 18 scrip will need to be sent in the near future.

## 2013-06-19 ENCOUNTER — Telehealth: Payer: Self-pay

## 2013-06-19 DIAGNOSIS — D496 Neoplasm of unspecified behavior of brain: Secondary | ICD-10-CM

## 2013-06-19 MED ORDER — TEMOZOLOMIDE 20 MG PO CAPS: 20.0000 mg | ORAL_CAPSULE | Freq: Every day | ORAL | Status: DC

## 2013-06-19 MED ORDER — TEMOZOLOMIDE 250 MG PO CAPS: 250.0000 mg | ORAL_CAPSULE | Freq: Every day | ORAL | Status: DC

## 2013-06-19 NOTE — Telephone Encounter (Signed)
Refill for cycle 18 5/21-5/25.  To be faxed on Monday 06/22/13.

## 2013-06-22 ENCOUNTER — Other Ambulatory Visit: Payer: Self-pay | Admitting: *Deleted

## 2013-06-22 DIAGNOSIS — D496 Neoplasm of unspecified behavior of brain: Secondary | ICD-10-CM

## 2013-06-22 NOTE — Telephone Encounter (Signed)
This RN faxed a prescription for Temodar to [facility], fax # [phone]. Patient uses Haematologist, [place]. [place], [place], [place].

## 2013-06-24 ENCOUNTER — Other Ambulatory Visit (HOSPITAL_BASED_OUTPATIENT_CLINIC_OR_DEPARTMENT_OTHER): Payer: Medicare Other

## 2013-06-24 ENCOUNTER — Ambulatory Visit (HOSPITAL_BASED_OUTPATIENT_CLINIC_OR_DEPARTMENT_OTHER): Payer: Medicare Other | Admitting: Adult Health

## 2013-06-24 ENCOUNTER — Encounter: Payer: Self-pay | Admitting: Adult Health

## 2013-06-24 VITALS — BP 122/79 | HR 78 | Temp 98.5°F | Resp 18 | Ht 64.0 in | Wt 155.8 lb

## 2013-06-24 DIAGNOSIS — D496 Neoplasm of unspecified behavior of brain: Secondary | ICD-10-CM

## 2013-06-24 DIAGNOSIS — C712 Malignant neoplasm of temporal lobe: Secondary | ICD-10-CM

## 2013-06-24 LAB — COMPREHENSIVE METABOLIC PANEL (CC13)
ALT: 30 U/L (ref 0–55)
ANION GAP: 6 meq/L (ref 3–11)
AST: 22 U/L (ref 5–34)
Albumin: 3.8 g/dL (ref 3.5–5.0)
Alkaline Phosphatase: 101 U/L (ref 40–150)
BILIRUBIN TOTAL: 0.64 mg/dL (ref 0.20–1.20)
BUN: 17.6 mg/dL (ref 7.0–26.0)
CALCIUM: 9.7 mg/dL (ref 8.4–10.4)
CHLORIDE: 110 meq/L — AB (ref 98–109)
CO2: 27 mEq/L (ref 22–29)
CREATININE: 0.7 mg/dL (ref 0.6–1.1)
GLUCOSE: 85 mg/dL (ref 70–140)
Potassium: 4 mEq/L (ref 3.5–5.1)
Sodium: 143 mEq/L (ref 136–145)
Total Protein: 6.4 g/dL (ref 6.4–8.3)

## 2013-06-24 LAB — CBC WITH DIFFERENTIAL/PLATELET
BASO%: 0.9 % (ref 0.0–2.0)
BASOS ABS: 0 10*3/uL (ref 0.0–0.1)
EOS ABS: 0.2 10*3/uL (ref 0.0–0.5)
EOS%: 3.4 % (ref 0.0–7.0)
HEMATOCRIT: 40.3 % (ref 34.8–46.6)
HEMOGLOBIN: 13.4 g/dL (ref 11.6–15.9)
LYMPH#: 0.6 10*3/uL — AB (ref 0.9–3.3)
LYMPH%: 11.3 % — AB (ref 14.0–49.7)
MCH: 30.7 pg (ref 25.1–34.0)
MCHC: 33.2 g/dL (ref 31.5–36.0)
MCV: 92.4 fL (ref 79.5–101.0)
MONO#: 0.3 10*3/uL (ref 0.1–0.9)
MONO%: 6.2 % (ref 0.0–14.0)
NEUT#: 4 10*3/uL (ref 1.5–6.5)
NEUT%: 78.2 % — AB (ref 38.4–76.8)
PLATELETS: 231 10*3/uL (ref 145–400)
RBC: 4.36 10*6/uL (ref 3.70–5.45)
RDW: 14.2 % (ref 11.2–14.5)
WBC: 5.1 10*3/uL (ref 3.9–10.3)

## 2013-06-24 NOTE — Progress Notes (Signed)
Hematology and Oncology Follow Up Visit  Kelly Maxwell [id number] [birth year] 68 y.o. 06/25/2013 3:55 PM     Principle Diagnosis:Kelly Maxwell 68 y.o. female with grade 4 glioblastoma multiforme.   Prior Therapy:  #1 patient originally presented with 2-3 week loss of memory with trouble finding words. She had one episode of left parietal headache. Because of this she was seen by her internist an MRI of the brain was done on 12/13/2011 this showed a ring enhancing lesion in the left temporal lobe measuring 2.7 x 3.6 x 2.5 cm with 45 surrounding satellite lesions. She had a complete systemic workup including CT of the chest abdomen and pelvis that was negative. She went on to be seen by neurosurgery Dr. Luiz Ochoa and patient underwent subtotal resection of the dominant lesion on October 14. 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 She went on to see Dr. Fatima Sanger for 2 to who has been treating her she was also seen by [name].   #2 patient was begun on radiation therapy with concurrent Temodar at 75 mg per meter squared. She completed concurrent Temodar and radiation therapy in December 2013.   #3 patient was seen at [facility] as well as [facility]. [facility] has recommended the patient began adjuvant Temodar at 150 milligrams per meter squared she tolerates the first cycle do we could increase the dose to 200 mg per meter squared days one through 5 on a 28 day cycle.   #4 patient began adjuvant Temodar cycle 1 on 03/13/12 through 03/18/12 initially at 150 mg per meter squared. She tolerated this well. Then the recommendation was to increase the dose initially at a higher dose of 200 mg per meter squared. However she could not tolerate this. So the third cycle was reduced to 150 mg per meter squared days one through 5.  Current therapy:  Temodar days 1 through 5, at 150mg /meter squared, 270mg .  Cycle 17 starting  06/25/13  Interim History: Kelly Maxwell 68 y.o. female with glioblastoma multiforme.  She is here for evaluation prior to starting cycle 17 of Temodar.  She will begin this tomorrow, 06/25/13.  She is tolerating this well.  She denies fevers, chills, nausea, vomiting, diarrhea, numbness, weakness, headaches, or any further concerns.  She does tell me that she is worried that when she stops taking the temodar next month that her glioblastoma may progress.  She has an MRI of her brain on 5/15, f/u with [place] on 5/20, and f/u with Dr. Tammi Klippel on 5/21 in the morning, and f/u with Dr. Veryl Speak on 5/21 in the afternoon.    Medications:  Current Outpatient Prescriptions  Medication Sig Dispense Refill  . buPROPion (WELLBUTRIN XL) 300 MG 24 hr tablet Take 1 tablet (300 mg total) by mouth every morning.      . calcium-vitamin D (CALCIUM + D) 250-125 MG-UNIT per tablet Take 1 tablet by mouth 2 (two) times daily.      . cholecalciferol (VITAMIN D) 1000 UNITS tablet Take 1,000 Units by mouth daily.      . clonazePAM (KLONOPIN) 0.5 MG tablet Take 0.5 mg by mouth at bedtime as needed for anxiety.      [name] (ATIVAN) 1 MG tablet Take 1 mg by mouth every 6 (six) hours as needed for anxiety.      . Melatonin 1 MG CAPS Take 3 capsules by mouth at bedtime as needed.       Marland Kitchen  Multiple Vitamin (MULTIVITAMIN) tablet Take 1 tablet by mouth daily.      . ondansetron (ZOFRAN-ODT) 8 MG disintegrating tablet Take 8 mg by mouth every 8 (eight) hours as needed for nausea.      . pantoprazole (PROTONIX) 40 MG tablet Take 40 mg by mouth daily.      . promethazine (PHENERGAN) 25 MG suppository Place 25 mg rectally every 6 (six) hours as needed for nausea.      . simvastatin (ZOCOR) 40 MG tablet Take 40 mg by mouth daily at 6 PM.      . temozolomide (TEMODAR) 20 MG capsule Take 1 capsule (20 mg total) by mouth daily. May take on an empty stomach or at bedtime to decrease nausea & vomiting. To be taken 5/21 through 5/25 for cycle  18  5 capsule  0  . temozolomide (TEMODAR) 250 MG capsule Take 1 capsule (250 mg total) by mouth daily. May take on an empty stomach or at bedtime to decrease nausea & vomiting. To be taken 5/21 through 5/25 for cycle 18.  5 capsule  0  . tretinoin (RETIN-A) 0.05 % cream Apply topically at bedtime.       No current facility-administered medications for this visit.     Allergies:  Allergies  Allergen Reactions  . Codeine Nausea And Vomiting and Nausea Only  . Compazine [Prochlorperazine Edisylate] Other (See Comments) and Rash    Became hyper Became hyper    Medical History: Past Medical History  Diagnosis Date  . PONV (postoperative nausea and vomiting)   . Depression   . GERD (gastroesophageal reflux disease)   . Headache(784.0)   . HLD (hyperlipidemia)   . Brain tumor 12/18/2011    Left temporal lobe 2.7 x 3.6 x 2.5 cm ring enhancing; numerous small surrounding satellite lesions 12/13/11  . 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 Hiatal hernia   . History of radiation therapy 01/07/2012-02/18/12    left temporal  58GY  . Cancer     Surgical History:  Past Surgical History  Procedure Laterality Date  . Total knee arthroplasty  2000    left  . Bunionectomy  2001    right  . Craniotomy  12/17/2011    Procedure: CRANIOTOMY TUMOR EXCISION;  Surgeon: Otilio Connors, MD;  Location: [facility];  Service: Neurosurgery;  Laterality: Left;  LEFT temporal craniotomy with stealth for tumor resection  . Dilation and curettage of uterus  2007     Review of Systems: A 10 point review of systems was conducted and is otherwise negative except for what is noted above.     Physical Exam: Blood pressure 122/79, pulse 78, temperature 98.5 F (36.9 C), temperature source Oral, resp. rate 18, height 5\' 4"  (1.626 m), weight 155 lb 12.8 oz (70.67 kg). GENERAL: Patient is a well appearing female in no acute distress HEENT:  Sclerae anicteric.  Oropharynx clear  and moist. No ulcerations or evidence of oropharyngeal candidiasis. Neck is supple.  NODES:  No cervical, supraclavicular, or axillary lymphadenopathy palpated.  LUNGS:  Clear to auscultation bilaterally.  No wheezes or rhonchi. HEART:  Regular rate and rhythm. No murmur appreciated. ABDOMEN:  Soft, nontender.  Positive, normoactive bowel sounds. No organomegaly palpated. MSK:  No focal spinal tenderness to palpation. Full range of motion bilaterally in the upper extremities. EXTREMITIES:  No peripheral edema.   SKIN:  Clear with no obvious rashes or skin changes. No nail dyscrasia. NEURO:  Nonfocal. Well oriented.  Appropriate affect. ECOG PERFORMANCE STATUS: 0 - Asymptomatic   Lab Results: Lab Results  Component Value Date   WBC 5.1 06/24/2013   HGB 13.4 06/24/2013   HCT 40.3 06/24/2013   MCV 92.4 06/24/2013   PLT 231 06/24/2013     Chemistry      Component Value Date/Time   NA 143 06/24/2013 0944   NA 138 12/18/2011 0440   K 4.0 06/24/2013 0944   K 4.0 12/18/2011 0440   CL 111* 08/04/2012 1008   CL 107 12/18/2011 0440   CO2 27 06/24/2013 0944   CO2 22 12/18/2011 0440   BUN 17.6 06/24/2013 0944   BUN 17 12/18/2011 0440   CREATININE 0.7 06/24/2013 0944   CREATININE 0.50 12/18/2011 0440   CREATININE 0.50 12/12/2011 0000      Component Value Date/Time   CALCIUM 9.7 06/24/2013 0944   CALCIUM 8.7 12/18/2011 0440   ALKPHOS 101 06/24/2013 0944   AST 22 06/24/2013 0944   ALT 30 06/24/2013 0944   BILITOT 0.64 06/24/2013 0944     Assessment and Plan: Kelly Maxwell 68 y.o. female with grade 4 glioblastoma multiforme.  The patient is currently receiving adjuvant Temodar dosed at 150 mg/meter squared given on days 1-5 ona 28 day cycle.  She will start cycle 17 on 06/25/13.  She is tolerating therapy well and with no difficulty.  She will undergo an MRI of the brain on 07/17/13.  She will f/u with [place] on 5/20 for labs and evaluation, and will f/u with Dr. Tammi Klippel along with Dr. Veryl Speak on 5/21.     The patient will return in 4 weeks to discuss MRI results, and starting her final cycle of Temodar.  Please call us if you have any questions or concerns.    I spent 15 minutes counseling the patient face to face.  The total time spent in the appointment was 30 minutes.  Minette Headland, [place] [phone] 06/25/2013 3:55 PM

## 2013-06-25 ENCOUNTER — Encounter: Payer: Self-pay | Admitting: Adult Health

## 2013-07-06 ENCOUNTER — Telehealth: Payer: Self-pay | Admitting: Adult Health

## 2013-07-06 ENCOUNTER — Other Ambulatory Visit: Payer: Self-pay | Admitting: Obstetrics and Gynecology

## 2013-07-06 ENCOUNTER — Other Ambulatory Visit (HOSPITAL_COMMUNITY)
Admission: RE | Admit: 2013-07-06 | Discharge: 2013-07-06 | Disposition: A | Discharge status: Home / Self Care | Payer: Medicare Other | Source: Ambulatory Visit | Attending: Obstetrics and Gynecology | Admitting: Obstetrics and Gynecology

## 2013-07-06 DIAGNOSIS — Z124 Encounter for screening for malignant neoplasm of cervix: Secondary | ICD-10-CM | POA: Insufficient documentation

## 2013-07-06 DIAGNOSIS — Z1151 Encounter for screening for human papillomavirus (HPV): Secondary | ICD-10-CM | POA: Insufficient documentation

## 2013-07-06 NOTE — Telephone Encounter (Signed)
, °

## 2013-07-15 ENCOUNTER — Ambulatory Visit
Admission: RE | Admit: 2013-07-15 | Discharge: 2013-07-15 | Disposition: A | Discharge status: Home / Self Care | Payer: Medicare Other | Source: Ambulatory Visit | Attending: Radiation Oncology | Admitting: Radiation Oncology

## 2013-07-15 DIAGNOSIS — D496 Neoplasm of unspecified behavior of brain: Secondary | ICD-10-CM

## 2013-07-15 MED ORDER — GADOBENATE DIMEGLUMINE 529 MG/ML IV SOLN
14.0000 mL | Freq: Once | INTRAVENOUS | Status: AC | PRN
  Administered 2013-07-15: 14 mL via INTRAVENOUS

## 2013-07-16 ENCOUNTER — Encounter: Payer: Self-pay | Admitting: Adult Health

## 2013-07-16 ENCOUNTER — Other Ambulatory Visit (HOSPITAL_BASED_OUTPATIENT_CLINIC_OR_DEPARTMENT_OTHER): Payer: Medicare Other

## 2013-07-16 ENCOUNTER — Ambulatory Visit (HOSPITAL_BASED_OUTPATIENT_CLINIC_OR_DEPARTMENT_OTHER): Payer: Medicare Other | Admitting: Adult Health

## 2013-07-16 VITALS — BP 122/79 | HR 73 | Temp 98.1°F | Resp 19 | Ht 64.0 in | Wt 159.2 lb

## 2013-07-16 DIAGNOSIS — D496 Neoplasm of unspecified behavior of brain: Secondary | ICD-10-CM

## 2013-07-16 DIAGNOSIS — C712 Malignant neoplasm of temporal lobe: Secondary | ICD-10-CM

## 2013-07-16 LAB — CBC WITH DIFFERENTIAL/PLATELET
BASO%: 0.3 % (ref 0.0–2.0)
BASOS ABS: 0 10*3/uL (ref 0.0–0.1)
EOS%: 3.2 % (ref 0.0–7.0)
Eosinophils Absolute: 0.1 10*3/uL (ref 0.0–0.5)
HEMATOCRIT: 40.2 % (ref 34.8–46.6)
HEMOGLOBIN: 13.1 g/dL (ref 11.6–15.9)
LYMPH#: 0.5 10*3/uL — AB (ref 0.9–3.3)
LYMPH%: 12.8 % — ABNORMAL LOW (ref 14.0–49.7)
MCH: 30.7 pg (ref 25.1–34.0)
MCHC: 32.6 g/dL (ref 31.5–36.0)
MCV: 94.1 fL (ref 79.5–101.0)
MONO#: 0.2 10*3/uL (ref 0.1–0.9)
MONO%: 6.1 % (ref 0.0–14.0)
NEUT#: 2.9 10*3/uL (ref 1.5–6.5)
NEUT%: 77.6 % — ABNORMAL HIGH (ref 38.4–76.8)
Platelets: 249 10*3/uL (ref 145–400)
RBC: 4.27 10*6/uL (ref 3.70–5.45)
RDW: 14.3 % (ref 11.2–14.5)
WBC: 3.8 10*3/uL — AB (ref 3.9–10.3)

## 2013-07-16 LAB — COMPREHENSIVE METABOLIC PANEL (CC13)
ALT: 24 U/L (ref 0–55)
AST: 17 U/L (ref 5–34)
Albumin: 3.5 g/dL (ref 3.5–5.0)
Alkaline Phosphatase: 86 U/L (ref 40–150)
Anion Gap: 7 mEq/L (ref 3–11)
BUN: 17.1 mg/dL (ref 7.0–26.0)
CALCIUM: 9.7 mg/dL (ref 8.4–10.4)
CHLORIDE: 111 meq/L — AB (ref 98–109)
CO2: 26 meq/L (ref 22–29)
Creatinine: 0.7 mg/dL (ref 0.6–1.1)
Glucose: 100 mg/dl (ref 70–140)
Potassium: 4.5 mEq/L (ref 3.5–5.1)
Sodium: 143 mEq/L (ref 136–145)
Total Bilirubin: 0.46 mg/dL (ref 0.20–1.20)
Total Protein: 6.1 g/dL — ABNORMAL LOW (ref 6.4–8.3)

## 2013-07-16 NOTE — Progress Notes (Signed)
Hematology and Oncology Follow Up Visit  Kelly Maxwell [id number] Jun 05, 1945 68 y.o. 07/18/2013 8:06 AM     Principal Diagnosis:Kelly Maxwell 68 y.o. female with grade 4 glioblastoma multiforme.   Prior Therapy:  #1 patient originally presented with 2-3 week loss of memory with trouble finding words. She had one episode of left parietal headache. Because of this she was seen by her internist an MRI of the brain was done on 12/13/2011 this showed a ring enhancing lesion in the left temporal lobe measuring 2.7 x 3.6 x 2.5 cm with 45 surrounding satellite lesions. She had a complete systemic workup including CT of the chest abdomen and pelvis that was negative. She went on to be seen by neurosurgery Dr. Luiz Ochoa and patient underwent subtotal resection of the dominant lesion on October 14. 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 She went on to see Dr. Fatima Sanger for 2 to who has been treating her she was also seen by [name].   #2 patient was begun on radiation therapy with concurrent Temodar at 75 mg per meter squared. She completed concurrent Temodar and radiation therapy in December 2013.   #3 patient was seen at [facility] as well as [facility]. [facility] has recommended the patient began adjuvant Temodar at 150 milligrams per meter squared  days one through 5 on a 28 day cycle.   #4 patient began adjuvant Temodar cycle 1 on 03/13/12 through 03/18/12 initially at 150 mg per meter squared. She tolerated this well. Then the recommendation was to increase the dose initially at a higher dose of 200 mg per meter squared. However she could not tolerate this. So the third cycle was reduced to 150 mg per meter squared days one through 5.  A total of 18 cycles were recommended.    Current therapy:  Temodar days 1 through 5, at 150mg /meter squared, 270mg .  Cycle 18 starting 07/23/13.    Interim History: Kelly Maxwell 68 y.o.  female with glioblastoma multiforme.  She is here for evaluation prior to starting cycle 18 of Temodar.  She will begin this on 07/23/13.  According to the notes, this prescription has been written by Dr. Humphrey Rolls and was to be faxed a couple of weeks ago.  She is feeling well today.  She denies any new vision changes, balance changes, headaches, nausea, vomiting, weakness, numbness, seizures, or any further concerns.  She is going out of town soon with her daughter to celebrate her daughter's graduation from [employer].    Medications:  Current Outpatient Prescriptions  Medication Sig Dispense Refill  . buPROPion (WELLBUTRIN XL) 300 MG 24 hr tablet Take 1 tablet (300 mg total) by mouth every morning.      . calcium-vitamin D (CALCIUM + D) 250-125 MG-UNIT per tablet Take 1 tablet by mouth 2 (two) times daily.      . cholecalciferol (VITAMIN D) 1000 UNITS tablet Take 1,000 Units by mouth daily.      . clonazePAM (KLONOPIN) 0.5 MG tablet Take 0.5 mg by mouth at bedtime as needed for anxiety.      [name] (ATIVAN) 1 MG tablet Take 1 mg by mouth every 6 (six) hours as needed for anxiety.      . Melatonin 1 MG CAPS Take 3 capsules by mouth at bedtime as needed.       . Multiple Vitamin (MULTIVITAMIN) tablet Take 1 tablet by mouth daily.      Marland Kitchen  pantoprazole (PROTONIX) 40 MG tablet Take 40 mg by mouth daily.      . simvastatin (ZOCOR) 40 MG tablet Take 40 mg by mouth daily at 6 PM.      . tretinoin (RETIN-A) 0.05 % cream Apply topically at bedtime.      . ondansetron (ZOFRAN-ODT) 8 MG disintegrating tablet Take 8 mg by mouth every 8 (eight) hours as needed for nausea.      . promethazine (PHENERGAN) 25 MG suppository Place 25 mg rectally every 6 (six) hours as needed for nausea.      Marland Kitchen temozolomide (TEMODAR) 20 MG capsule Take 1 capsule (20 mg total) by mouth daily. May take on an empty stomach or at bedtime to decrease nausea & vomiting. To be taken 5/21 through 5/25 for cycle 18  5 capsule  0  .  temozolomide (TEMODAR) 250 MG capsule Take 1 capsule (250 mg total) by mouth daily. May take on an empty stomach or at bedtime to decrease nausea & vomiting. To be taken 5/21 through 5/25 for cycle 18.  5 capsule  0   No current facility-administered medications for this visit.     Allergies:  Allergies  Allergen Reactions  . Codeine Nausea And Vomiting and Nausea Only  . Compazine [Prochlorperazine Edisylate] Other (See Comments) and Rash    Became hyper Became hyper    Medical History: Past Medical History  Diagnosis Date  . PONV (postoperative nausea and vomiting)   . Depression   . GERD (gastroesophageal reflux disease)   . Headache(784.0)   . HLD (hyperlipidemia)   . Brain tumor 12/18/2011    Left temporal lobe 2.7 x 3.6 x 2.5 cm ring enhancing; numerous small surrounding satellite lesions 12/13/11  . 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 Hiatal hernia   . History of radiation therapy 01/07/2012-02/18/12    left temporal  58GY  . Cancer     Surgical History:  Past Surgical History  Procedure Laterality Date  . Total knee arthroplasty  2000    left  . Bunionectomy  2001    right  . Craniotomy  12/17/2011    Procedure: CRANIOTOMY TUMOR EXCISION;  Surgeon: Otilio Connors, MD;  Location: [place] [facility];  Service: Neurosurgery;  Laterality: Left;  LEFT temporal craniotomy with stealth for tumor resection  . Dilation and curettage of uterus  2007     Review of Systems: A 10 point review of systems was conducted and is otherwise negative except for what is noted above.     Physical Exam: Blood pressure 122/79, pulse 73, temperature 98.1 F (36.7 C), temperature source Oral, resp. rate 19, height 5\' 4"  (1.626 m), weight 159 lb 3.2 oz (72.213 kg). GENERAL: Patient is a well appearing female in no acute distress HEENT:  Sclerae anicteric.  Oropharynx clear and moist. No ulcerations or evidence of oropharyngeal candidiasis. Neck is supple.   NODES:  No cervical, supraclavicular, or axillary lymphadenopathy palpated.  LUNGS:  Clear to auscultation bilaterally.  No wheezes or rhonchi. HEART:  Regular rate and rhythm. No murmur appreciated. ABDOMEN:  Soft, nontender.  Positive, normoactive bowel sounds. No organomegaly palpated. MSK:  No focal spinal tenderness to palpation. Full range of motion bilaterally in the upper extremities. EXTREMITIES:  No peripheral edema.   SKIN:  Clear with no obvious rashes or skin changes. No nail dyscrasia. NEURO:  Nonfocal. Well oriented.  Appropriate affect. ECOG PERFORMANCE STATUS: 0 - Asymptomatic   Lab  Results: Lab Results  Component Value Date   WBC 3.8* 07/16/2013   HGB 13.1 07/16/2013   HCT 40.2 07/16/2013   MCV 94.1 07/16/2013   PLT 249 07/16/2013     Chemistry      Component Value Date/Time   NA 143 07/16/2013 1051   NA 138 12/18/2011 0440   K 4.5 07/16/2013 1051   K 4.0 12/18/2011 0440   CL 111* 08/04/2012 1008   CL 107 12/18/2011 0440   CO2 26 07/16/2013 1051   CO2 22 12/18/2011 0440   BUN 17.1 07/16/2013 1051   BUN 17 12/18/2011 0440   CREATININE 0.7 07/16/2013 1051   CREATININE 0.50 12/18/2011 0440   CREATININE 0.50 12/12/2011 0000      Component Value Date/Time   CALCIUM 9.7 07/16/2013 1051   CALCIUM 8.7 12/18/2011 0440   ALKPHOS 86 07/16/2013 1051   AST 17 07/16/2013 1051   ALT 24 07/16/2013 1051   BILITOT 0.46 07/16/2013 1051     Assessment and Plan: Kelly Maxwell 68 y.o. female with grade 4 glioblastoma multiforme.  The patient is currently receiving adjuvant Temodar dosed at 150 mg/meter squared given on days 1-5 ona 28 day cycle.  She will start cycle 18 on 07/23/13.  Her labs are stable today.  I reviewed them with her in detail.  She underwent a MRI of the brain on 07/15/13 and it demonstrated stability and had no evidence of progression.  I reviewed this with her in detail.  She will follow up with Dr. Veryl Speak and Dr. Tammi Klippel next Thursday, 07/23/13.    The patient will  call me to request a follow up appointment after she sees Dr. Veryl Speak at [facility] and Dr. Tammi Klippel on 07/23/13.   She knows to call us in the interim for any questions or concerns.  We can certainly see her sooner if needed.   I spent 25 minutes counseling the patient face to face.  The total time spent in the appointment was 30 minutes.  Minette Headland, [place] [phone] 07/18/2013 8:06 AM

## 2013-07-17 ENCOUNTER — Other Ambulatory Visit: Payer: Medicare Other

## 2013-07-22 ENCOUNTER — Other Ambulatory Visit: Payer: Medicare Other

## 2013-07-22 ENCOUNTER — Ambulatory Visit: Payer: Medicare Other | Admitting: Adult Health

## 2013-07-23 ENCOUNTER — Encounter: Payer: Self-pay | Admitting: Radiation Oncology

## 2013-07-23 ENCOUNTER — Ambulatory Visit
Admission: RE | Admit: 2013-07-23 | Discharge: 2013-07-23 | Disposition: A | Discharge status: Home / Self Care | Payer: Medicare Other | Source: Ambulatory Visit | Attending: Radiation Oncology | Admitting: Radiation Oncology

## 2013-07-23 VITALS — BP 118/72 | HR 75 | Resp 16 | Wt 161.6 lb

## 2013-07-23 DIAGNOSIS — D496 Neoplasm of unspecified behavior of brain: Secondary | ICD-10-CM

## 2013-07-23 NOTE — Progress Notes (Signed)
Radiation Oncology         [phone] ________________________________  Name: Kelly Maxwell MRN: [id number]  Date: 07/23/2013  DOB: [birth year]  Follow-Up Visit Note  CC: Wenda Low, MD  Wenda Low, MD  Diagnosis:   69 year old woman s/p partial resection of a multifocal left temporal grade IV gliosarcoma s/p radiotherapy to 60 Gy - 01/07/2012-02/18/2012   Interval Since Last Radiation:  18  months  Narrative:  The patient returns today for routine follow-up.  Continuing to follow with Dr. Veryl Speak at [facility].  Patient returned last night from visiting the [facility]. She reports fatigue related to all the traveling. She is scheduled to follow up with Dr. Clayton Bibles at [facility] this afternoon. Weight stable. She denies headache, dizziness, nausea, vomiting, floaters or ringing in the ears. Denies diplopia. Patient without complaints. Vitals stable                              ALLERGIES:  is allergic to codeine and compazine.  Meds: Current Outpatient Prescriptions  Medication Sig Dispense Refill  . buPROPion (WELLBUTRIN XL) 300 MG 24 hr tablet Take 1 tablet (300 mg total) by mouth every morning.      . calcium-vitamin D (CALCIUM + D) 250-125 MG-UNIT per tablet Take 1 tablet by mouth 2 (two) times daily.      . cholecalciferol (VITAMIN D) 1000 UNITS tablet Take 1,000 Units by mouth daily.      . clonazePAM (KLONOPIN) 0.5 MG tablet Take 0.5 mg by mouth at bedtime as needed for anxiety.      [name] (ATIVAN) 1 MG tablet Take 1 mg by mouth every 6 (six) hours as needed for anxiety.      . Melatonin 1 MG CAPS Take 3 capsules by mouth at bedtime as needed.       . Multiple Vitamin (MULTIVITAMIN) tablet Take 1 tablet by mouth daily.      . ondansetron (ZOFRAN-ODT) 8 MG disintegrating tablet Take 8 mg by mouth every 8 (eight) hours as needed for nausea.      . pantoprazole (PROTONIX) 40 MG tablet Take 40 mg by mouth daily.      . simvastatin (ZOCOR) 40 MG tablet Take 40 mg by mouth daily at 6  PM.      . temozolomide (TEMODAR) 20 MG capsule Take 1 capsule (20 mg total) by mouth daily. May take on an empty stomach or at bedtime to decrease nausea & vomiting. To be taken 5/21 through 5/25 for cycle 18  5 capsule  0  . temozolomide (TEMODAR) 250 MG capsule Take 1 capsule (250 mg total) by mouth daily. May take on an empty stomach or at bedtime to decrease nausea & vomiting. To be taken 5/21 through 5/25 for cycle 18.  5 capsule  0  . tretinoin (RETIN-A) 0.05 % cream Apply topically at bedtime.      . promethazine (PHENERGAN) 25 MG suppository Place 25 mg rectally every 6 (six) hours as needed for nausea.       No current facility-administered medications for this encounter.    Physical Findings: The patient is in no acute distress. Patient is alert and oriented.  weight is 161 lb 9.6 oz (73.301 kg). Her blood pressure is 118/72 and her pulse is 75. Her respiration is 16. Marland Kitchen Neuro intact. No significant changes.  Lab Findings: Lab Results  Component Value Date   WBC 3.8* 07/16/2013   HGB 13.1  07/16/2013   HCT 40.2 07/16/2013   MCV 94.1 07/16/2013   PLT 249 07/16/2013    @LASTCHEM @  Radiographic Findings: Mr Jeri Cos BM Contrast  07/15/2013   CLINICAL DATA:  Restaging GBM/gliosarcoma. Subtotal resection on 12/17/2011. Treated with radiation and Temodar. No current symptoms.  EXAM: MRI HEAD WITHOUT AND WITH CONTRAST  TECHNIQUE: Multiplanar, multiecho pulse sequences of the brain and surrounding structures were obtained without and with intravenous contrast.  CONTRAST:  75mL MULTIHANCE GADOBENATE DIMEGLUMINE 529 MG/ML IV SOLN  COMPARISON:  Multiple priors, most recent 03/27/2013.  FINDINGS: There is surgical encephalomalacia in the left anterior temporal lobe status post subtotal resection of a grade 4 GBM. Moderately extensive T2 and FLAIR hyperintensities, left greater than right are seen throughout the supratentorial white matter, greatest at the site of surgery. No significant worsening  of abnormal T2 signal.  No areas of restricted diffusion have developed in or around the surgical site. There is no significant mass effect. Post infusion, a single focus of enhancement 3 x 5 mm in size in the subcortical white matter of the uncinate fasciculus remains stable. No new lesions are evident.  IMPRESSION: Stable exam. No progression of disease and no new areas of enhancement.   Electronically Signed   By: Rolla Flatten M.D.   On: 07/15/2013 13:35    Impression:  The patient has no evidence of progressive disease or recurrence at this time.  Plan:  MRI in 2 months then follow-up.  _____________________________________  Sheral Apley. Tammi Klippel, M.D.

## 2013-07-23 NOTE — Progress Notes (Signed)
Patient returned last night from visiting the [facility]. She reports fatigue related to all the traveling. She is scheduled to follow up with Dr. Clayton Bibles at [facility] this afternoon. Weight stable. She denies headache, dizziness, nausea, vomiting, floaters or ringing in the ears. Denies diplopia. Patient without complaints. Vitals stable.

## 2013-07-24 ENCOUNTER — Telehealth: Payer: Self-pay | Admitting: *Deleted

## 2013-07-24 NOTE — Telephone Encounter (Signed)
Called to see if pt had received her Temodar. Pt received medication on 07/22/13. First day of taking Temodar was Thursday 07/23/13. Message to be forwarded to Charlestine Massed, NP.

## 2013-08-07 ENCOUNTER — Telehealth: Payer: Self-pay

## 2013-08-07 NOTE — Telephone Encounter (Signed)
Received progress notes by fax dtd 08/06/13 Norman Herrlich APRN.  Copy to [place].  Original to scan.

## 2013-08-28 ENCOUNTER — Other Ambulatory Visit: Payer: Self-pay | Admitting: Radiation Therapy

## 2013-08-28 ENCOUNTER — Encounter: Payer: Self-pay | Admitting: Radiation Therapy

## 2013-08-28 DIAGNOSIS — D496 Neoplasm of unspecified behavior of brain: Secondary | ICD-10-CM

## 2013-08-28 NOTE — Progress Notes (Signed)
Kelly Maxwell called to let me know that she has an appointment at [facility] on Monday 7/13. She asked that I arrange for an MRI and follow-up with Dr. Tammi Klippel around that. She also mentioned wanting to be seen by Mendel Ryder in Medical Oncology for her labs and regular follow-up with Med Onc. here in [place]. [name] needs a recent BUN and Creat for her MRI scheduled on 7/10. Rather than have labs drawn for just that and then her be stuck again for the CBC and CMET, I have asked that we get the CBC and CMETdrawn before her MRI. I spoke with Erline Levine and she said that would not be a problem. There is already a standing order for these labs in Epic, so there should not be a problem when she comes.  Labs - 7/9 @ 10:00 MRI - 7/10 @ 2:00 Follow-up at [facility]- 7/13 Follow-up with Dr. Tammi Klippel 7/15   ** She does what to be seen by Mendel Ryder also,sometime the week of 7/13.  Thanks,    Mont Dutton R.T. (R) (T) Radiation Special Procedures [place] [phone] Office [phone] Pager [phone] Fax [email or web address]

## 2013-08-29 ENCOUNTER — Other Ambulatory Visit: Payer: Self-pay | Admitting: Adult Health

## 2013-08-31 ENCOUNTER — Telehealth: Payer: Self-pay | Admitting: Oncology

## 2013-08-31 NOTE — Telephone Encounter (Signed)
s.w. pt husband and advised on July appt...ok and aware

## 2013-09-10 ENCOUNTER — Ambulatory Visit: Payer: Medicare Other | Attending: Internal Medicine

## 2013-09-11 ENCOUNTER — Ambulatory Visit: Admission: RE | Admit: 2013-09-11 | Payer: Medicare Other | Source: Ambulatory Visit

## 2013-09-11 ENCOUNTER — Ambulatory Visit
Admission: RE | Admit: 2013-09-11 | Discharge: 2013-09-11 | Disposition: A | Discharge status: Home / Self Care | Payer: Medicare Other | Source: Ambulatory Visit | Attending: Radiation Oncology | Admitting: Radiation Oncology

## 2013-09-11 ENCOUNTER — Ambulatory Visit (HOSPITAL_BASED_OUTPATIENT_CLINIC_OR_DEPARTMENT_OTHER): Payer: Medicare Other

## 2013-09-11 DIAGNOSIS — D496 Neoplasm of unspecified behavior of brain: Secondary | ICD-10-CM

## 2013-09-11 DIAGNOSIS — C712 Malignant neoplasm of temporal lobe: Secondary | ICD-10-CM

## 2013-09-11 LAB — CBC WITH DIFFERENTIAL/PLATELET
BASO%: 0.6 % (ref 0.0–2.0)
Basophils Absolute: 0 10*3/uL (ref 0.0–0.1)
EOS ABS: 0.1 10*3/uL (ref 0.0–0.5)
EOS%: 2.1 % (ref 0.0–7.0)
HCT: 41.9 % (ref 34.8–46.6)
HGB: 14.1 g/dL (ref 11.6–15.9)
LYMPH#: 0.8 10*3/uL — AB (ref 0.9–3.3)
LYMPH%: 14.2 % (ref 14.0–49.7)
MCH: 31 pg (ref 25.1–34.0)
MCHC: 33.6 g/dL (ref 31.5–36.0)
MCV: 92.3 fL (ref 79.5–101.0)
MONO#: 0.3 10*3/uL (ref 0.1–0.9)
MONO%: 5.1 % (ref 0.0–14.0)
NEUT%: 78 % — ABNORMAL HIGH (ref 38.4–76.8)
NEUTROS ABS: 4.5 10*3/uL (ref 1.5–6.5)
PLATELETS: 248 10*3/uL (ref 145–400)
RBC: 4.54 10*6/uL (ref 3.70–5.45)
RDW: 14.4 % (ref 11.2–14.5)
WBC: 5.8 10*3/uL (ref 3.9–10.3)

## 2013-09-11 LAB — COMPREHENSIVE METABOLIC PANEL (CC13)
ALT: 27 U/L (ref 0–55)
AST: 19 U/L (ref 5–34)
Albumin: 3.9 g/dL (ref 3.5–5.0)
Alkaline Phosphatase: 106 U/L (ref 40–150)
Anion Gap: 8 mEq/L (ref 3–11)
BILIRUBIN TOTAL: 0.72 mg/dL (ref 0.20–1.20)
BUN: 18.6 mg/dL (ref 7.0–26.0)
CALCIUM: 9.5 mg/dL (ref 8.4–10.4)
CHLORIDE: 109 meq/L (ref 98–109)
CO2: 23 meq/L (ref 22–29)
Creatinine: 0.7 mg/dL (ref 0.6–1.1)
GLUCOSE: 87 mg/dL (ref 70–140)
POTASSIUM: 4.7 meq/L (ref 3.5–5.1)
Sodium: 140 mEq/L (ref 136–145)
Total Protein: 6.6 g/dL (ref 6.4–8.3)

## 2013-09-11 MED ORDER — GADOBENATE DIMEGLUMINE 529 MG/ML IV SOLN
15.0000 mL | Freq: Once | INTRAVENOUS | Status: AC | PRN
  Administered 2013-09-11: 15 mL via INTRAVENOUS

## 2013-09-12 ENCOUNTER — Encounter: Payer: Self-pay | Admitting: Adult Health

## 2013-09-12 ENCOUNTER — Encounter: Payer: Self-pay | Admitting: Radiation Oncology

## 2013-09-14 ENCOUNTER — Telehealth: Payer: Self-pay | Admitting: Radiation Oncology

## 2013-09-14 NOTE — Telephone Encounter (Signed)
That requested results are now available on MyChart for review. Encouraged to call with questions and contact number was provided.

## 2013-09-15 ENCOUNTER — Encounter: Payer: Self-pay | Admitting: Radiation Oncology

## 2013-09-15 NOTE — Progress Notes (Signed)
Radiation Oncology         [phone] ________________________________  Name: Kelly COULON MRN: [id number]  Date: 09/16/2013  DOB: Feb 27, 1946  Follow-Up Visit Note  CC: Wenda Low, MD  Wenda Low, MD  Diagnosis:   68 year old woman s/p partial resection of a multifocal left temporal grade IV gliosarcoma s/p radiotherapy to 60 Gy - 01/07/2012-02/18/2012   Interval Since Last Radiation:  20  months  Narrative:  The patient returns today for routine follow-up.  Continuing to follow with Dr. Veryl Speak at [facility]. Patient understands from [facility] follow up that her brain MRI from 09/11/2013 is stable. Patient here today with daughter to have review the MRI with her. Slow steady gait noted. Patient reports fatigue. Ptosis of left eye noted. Denies diplopia or ringing in the ears. Denies nausea or vomiting. Last dose of Temodar taken in May. Reports several weeks ago she fell hard down her wooden steps at home, hurt her back and developed shingles a few days after. Reports she seldomly experiences headaches. Weight and vitals stable. Denies dizziness                               ALLERGIES:  is allergic to codeine and compazine.  Meds: Current Outpatient Prescriptions  Medication Sig Dispense Refill  . buPROPion (WELLBUTRIN XL) 300 MG 24 hr tablet Take 1 tablet (300 mg total) by mouth every morning.      . calcium-vitamin D (CALCIUM + D) 250-125 MG-UNIT per tablet Take 1 tablet by mouth 2 (two) times daily.      . cholecalciferol (VITAMIN D) 1000 UNITS tablet Take 1,000 Units by mouth daily.      . Melatonin 1 MG CAPS Take 3 capsules by mouth at bedtime as needed.       . Multiple Vitamin (MULTIVITAMIN) tablet Take 1 tablet by mouth daily.      . pantoprazole (PROTONIX) 40 MG tablet Take 40 mg by mouth daily.      . simvastatin (ZOCOR) 40 MG tablet Take 40 mg by mouth daily at 6 PM.      . traMADol (ULTRAM) 50 MG tablet every 6 (six) hours as needed.      . tretinoin (RETIN-A) 0.05 % cream  Apply topically at bedtime.      Marland Kitchen trifluridine (VIROPTIC) 1 % ophthalmic solution       . valACYclovir (VALTREX) 1000 MG tablet       . clonazePAM (KLONOPIN) 0.5 MG tablet Take 0.5 mg by mouth at bedtime as needed for anxiety.      [name] (ATIVAN) 1 MG tablet Take 1 mg by mouth every 6 (six) hours as needed for anxiety.      . ondansetron (ZOFRAN-ODT) 8 MG disintegrating tablet Take 8 mg by mouth every 8 (eight) hours as needed for nausea.      . promethazine (PHENERGAN) 25 MG suppository Place 25 mg rectally every 6 (six) hours as needed for nausea.      Marland Kitchen temozolomide (TEMODAR) 20 MG capsule Take 1 capsule (20 mg total) by mouth daily. May take on an empty stomach or at bedtime to decrease nausea & vomiting. To be taken 5/21 through 5/25 for cycle 18  5 capsule  0  . temozolomide (TEMODAR) 250 MG capsule Take 1 capsule (250 mg total) by mouth daily. May take on an empty stomach or at bedtime to decrease nausea & vomiting. To be taken 5/21 through 5/25  for cycle 18.  5 capsule  0   No current facility-administered medications for this encounter.    Physical Findings: The patient is in no acute distress. Patient is alert and oriented.  weight is 163 lb 12.8 oz (74.299 kg). Her blood pressure is 136/66 and her pulse is 74. Her respiration is 16. .  No significant changes.  Lab Findings: Lab Results  Component Value Date   WBC 5.8 09/11/2013   HGB 14.1 09/11/2013   HCT 41.9 09/11/2013   MCV 92.3 09/11/2013   PLT 248 09/11/2013    @LASTCHEM @  Radiographic Findings: Mr Jeri Cos [place] Contrast  09/11/2013   CLINICAL DATA:  Restaging GBM/gliosarcoma. Subtotal resection 2013. Status post radiation and Temodar. No current symptoms.  EXAM: MRI HEAD WITHOUT AND WITH CONTRAST  TECHNIQUE: Multiplanar, multiecho pulse sequences of the brain and surrounding structures were obtained without and with intravenous contrast.  CONTRAST:  73mL MULTIHANCE GADOBENATE DIMEGLUMINE 529 MG/ML IV SOLN  COMPARISON:   Most recent 07/15/2013.  FINDINGS: Surgical encephalomalacia left anterior temporal lobe is noted status post subtotal resection of an intra-axial glial tumor. Moderately extensive T2 and FLAIR hyperintensities, left greater than right, are seen throughout the supratentorial white matter, most prominent surrounding the site of surgery. No significant worsening of T2 signal nor is there significant restricted diffusion. No significant mass effect or midline shift.  Post infusion, single focus of enhancement is re- demonstrated, 3 x 5 mm cross-section in the subcortical white matter of the left uncinate fasciculus, image 33 series 10, stable. No new lesions are evident. Expected postoperative dural enhancement is not worrisome.  IMPRESSION: Stable exam. No interval change from study two months earlier. No evidence for progression of tumor.   Electronically Signed   By: Rolla Flatten M.D.   On: 09/11/2013 16:29    Impression:  The patient has no evidence of recurrent disease at this time.  Plan:  MRI in 2 months, then follow-up.  _____________________________________  Sheral Apley. Tammi Klippel, M.D.

## 2013-09-16 ENCOUNTER — Encounter: Payer: Self-pay | Admitting: Radiation Oncology

## 2013-09-16 ENCOUNTER — Ambulatory Visit: Payer: Medicare Other | Admitting: Adult Health

## 2013-09-16 ENCOUNTER — Ambulatory Visit
Admission: RE | Admit: 2013-09-16 | Discharge: 2013-09-16 | Disposition: A | Discharge status: Home / Self Care | Payer: Medicare Other | Source: Ambulatory Visit | Attending: Radiation Oncology | Admitting: Radiation Oncology

## 2013-09-16 VITALS — BP 136/66 | HR 74 | Resp 16 | Wt 163.8 lb

## 2013-09-16 DIAGNOSIS — D496 Neoplasm of unspecified behavior of brain: Secondary | ICD-10-CM

## 2013-09-16 NOTE — Progress Notes (Signed)
Patient understands from [facility] follow up that her brain MRI from 09/11/2013 is stable. Patient here today with daughter to have Dr. Tammi Klippel review the same MRI with her. Slow steady gait noted. Patient reports fatigue. Ptosis of left eye noted. Denies diplopia or ringing in the ears. Denies nausea or vomiting. Last dose of Temodar taken in May. Reports several weeks ago she fell hard down her wooden steps at home, hurt her back and developed shingles a few days after. Reports she seldomly experiences headaches. Weight and vitals stable. Denies dizziness.

## 2013-09-18 ENCOUNTER — Ambulatory Visit: Payer: Medicare Other | Admitting: Adult Health

## 2013-09-21 ENCOUNTER — Other Ambulatory Visit: Payer: Self-pay | Admitting: Radiation Therapy

## 2013-09-21 DIAGNOSIS — D496 Neoplasm of unspecified behavior of brain: Secondary | ICD-10-CM

## 2013-11-04 ENCOUNTER — Other Ambulatory Visit: Payer: Self-pay | Admitting: *Deleted

## 2013-11-04 DIAGNOSIS — C7931 Secondary malignant neoplasm of brain: Secondary | ICD-10-CM

## 2013-11-04 DIAGNOSIS — C7949 Secondary malignant neoplasm of other parts of nervous system: Principal | ICD-10-CM

## 2013-11-10 ENCOUNTER — Ambulatory Visit
Admission: RE | Admit: 2013-11-10 | Discharge: 2013-11-10 | Disposition: A | Discharge status: Home / Self Care | Payer: Medicare Other | Source: Ambulatory Visit | Attending: Radiation Oncology | Admitting: Radiation Oncology

## 2013-11-10 DIAGNOSIS — C7931 Secondary malignant neoplasm of brain: Secondary | ICD-10-CM | POA: Insufficient documentation

## 2013-11-10 DIAGNOSIS — C7949 Secondary malignant neoplasm of other parts of nervous system: Secondary | ICD-10-CM | POA: Diagnosis present

## 2013-11-10 LAB — BUN AND CREATININE (CC13)
BUN: 16.5 mg/dL (ref 7.0–26.0)
Creatinine: 0.7 mg/dL (ref 0.6–1.1)

## 2013-11-11 ENCOUNTER — Encounter: Payer: Self-pay | Admitting: Radiation Oncology

## 2013-11-11 ENCOUNTER — Other Ambulatory Visit: Payer: Medicare Other

## 2013-11-11 ENCOUNTER — Encounter: Payer: Self-pay | Admitting: Adult Health

## 2013-11-11 ENCOUNTER — Ambulatory Visit
Admission: RE | Admit: 2013-11-11 | Discharge: 2013-11-11 | Disposition: A | Discharge status: Home / Self Care | Payer: Medicare Other | Source: Ambulatory Visit | Attending: Radiation Oncology | Admitting: Radiation Oncology

## 2013-11-11 DIAGNOSIS — D496 Neoplasm of unspecified behavior of brain: Secondary | ICD-10-CM

## 2013-11-11 MED ORDER — GADOBENATE DIMEGLUMINE 529 MG/ML IV SOLN
15.0000 mL | Freq: Once | INTRAVENOUS | Status: AC | PRN
Start: 2013-11-11 — End: 2013-11-11
  Administered 2013-11-11: 15 mL via INTRAVENOUS

## 2013-11-12 ENCOUNTER — Encounter: Payer: Self-pay | Admitting: Radiation Oncology

## 2013-11-12 ENCOUNTER — Telehealth: Payer: Self-pay | Admitting: Adult Health

## 2013-11-12 ENCOUNTER — Telehealth: Payer: Self-pay | Admitting: Radiation Oncology

## 2013-11-12 ENCOUNTER — Other Ambulatory Visit: Payer: Self-pay | Admitting: Adult Health

## 2013-11-12 NOTE — Telephone Encounter (Signed)
Auto release due 11-16-2013.  BUN stable at 16.5, creat = 0.7 as has been result each time for past 5 months.

## 2013-11-12 NOTE — Progress Notes (Signed)
Patient reports that she has "been off the Temodar since the end of May."

## 2013-11-12 NOTE — Telephone Encounter (Signed)
Spoke with both patient and her husband. Confirmed there is no change and no evidence of reoccurrence on MRI scan from yesterday. Patient verbalized understanding, expressed relief, and appreciation for the prompt notification. Confirmed 11/19/2013 0900 follow with Dr. Tammi Klippel.

## 2013-11-12 NOTE — Telephone Encounter (Signed)
s.w. pt husband and avised on Sept appt....Marland Kitchenok and aware

## 2013-11-18 ENCOUNTER — Ambulatory Visit: Payer: Medicare Other | Admitting: Adult Health

## 2013-11-18 ENCOUNTER — Telehealth: Payer: Self-pay | Admitting: Radiation Oncology

## 2013-11-18 NOTE — Telephone Encounter (Signed)
Patient returning call from message left by Dr. Tammi Klippel. Patient already aware from prior conversations that recent MRI shows no evidence of reoccurrence. Explained that Dr. Tammi Klippel reviewed [facility]'s notes. Patient denies having any questions or concerns. Patient agrees there is no need for a follow up appointment with Dr. Tammi Klippel on Thursday. Patient reports her next follow up at [facility] is on November 16. She request her two month out MRI be scheduled for November 12 or 13th in the morning. Understands Marcine Matar with contact her with MRI and follow up appointment. Understands to contact this RN with any future needs.

## 2013-11-19 ENCOUNTER — Ambulatory Visit: Admission: RE | Admit: 2013-11-19 | Payer: Medicare Other | Source: Ambulatory Visit | Admitting: Radiation Oncology

## 2013-11-20 ENCOUNTER — Other Ambulatory Visit: Payer: Self-pay | Admitting: Radiation Therapy

## 2013-11-20 DIAGNOSIS — D496 Neoplasm of unspecified behavior of brain: Secondary | ICD-10-CM

## 2013-11-24 ENCOUNTER — Telehealth: Payer: Self-pay | Admitting: *Deleted

## 2013-11-24 NOTE — Telephone Encounter (Signed)
Received progress notes from [facility]/Dr. Veryl Speak, Sent to scan.

## 2014-01-07 ENCOUNTER — Other Ambulatory Visit: Payer: Self-pay | Admitting: Radiation Therapy

## 2014-01-07 DIAGNOSIS — D496 Neoplasm of unspecified behavior of brain: Secondary | ICD-10-CM

## 2014-01-08 ENCOUNTER — Ambulatory Visit
Admission: RE | Admit: 2014-01-08 | Discharge: 2014-01-08 | Disposition: A | Discharge status: Home / Self Care | Payer: Medicare Other | Source: Ambulatory Visit | Attending: Radiation Oncology | Admitting: Radiation Oncology

## 2014-01-08 DIAGNOSIS — D496 Neoplasm of unspecified behavior of brain: Secondary | ICD-10-CM | POA: Diagnosis present

## 2014-01-08 LAB — BUN AND CREATININE (CC13)
BUN: 17.1 mg/dL (ref 7.0–26.0)
Creatinine: 0.7 mg/dL (ref 0.6–1.1)

## 2014-01-14 ENCOUNTER — Encounter: Payer: Self-pay | Admitting: Radiation Oncology

## 2014-01-14 ENCOUNTER — Ambulatory Visit
Admission: RE | Admit: 2014-01-14 | Discharge: 2014-01-14 | Disposition: A | Discharge status: Home / Self Care | Payer: Medicare Other | Source: Ambulatory Visit | Attending: Radiation Oncology | Admitting: Radiation Oncology

## 2014-01-14 DIAGNOSIS — D496 Neoplasm of unspecified behavior of brain: Secondary | ICD-10-CM

## 2014-01-14 MED ORDER — GADOBENATE DIMEGLUMINE 529 MG/ML IV SOLN
15.0000 mL | Freq: Once | INTRAVENOUS | Status: AC | PRN
  Administered 2014-01-14: 15 mL via INTRAVENOUS

## 2014-01-15 ENCOUNTER — Telehealth: Payer: Self-pay | Admitting: Radiation Oncology

## 2014-01-15 NOTE — Telephone Encounter (Signed)
Called to inform patient of MRI results. No answer. Left message requesting return call.

## 2014-01-17 ENCOUNTER — Encounter: Payer: Self-pay | Admitting: Radiation Therapy

## 2014-01-17 NOTE — Progress Notes (Signed)
Radiation Oncology         [phone]   Name: DAVIANNA Maxwell   Date: 01/21/2014   MRN: [id number]  DOB: 1945/05/13    Multidisciplinary Brain and Spine Oncology Clinic Follow-Up Visit Note  CC: Wenda Low, MD  Wenda Low, MD    ICD-9-CM ICD-10-CM   1. Brain tumor 239.6 D49.6     Diagnosis:   Multifocal Lt Temporal Grace IV gliosarcoma  Interval Since Last Radiation:  2 years   History:  Presented with memory and word finding problems in early October 2013. She had a single episode of a left parietal headache but other than that, no chronic or progressive headache. She denied any change in vision. No diplopia. No slurred speech. No focal weakness. There is a family history of strokes and she thought she might be having a minor stroke.  An MRI of the brain was done on 12/13/11 showing a ring-enhancing lesion in the left temporal lobe 2.7 x 3.6 x 2.5 cm with 4-5 surrounding satellite lesions. A CT scan of the chest, abdomen, and pelvis was done and was unrevealing for a primary source of tumor other than the brain. She was referred to Dr. 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 Her aphasic symptoms improved. A post-op MRI showed partial debulking of the tumor.       Following recovery, she started radiation therapy with concurrent Temodar, completing on 02/18/12.       She was later seen at [place] as well as [facility] to seek clinical trials she may participate in. [facility] recommended she begin adjuvant Temodar, she continues to go to [facility] for management of her Temodar and continues to be followed there as well.   Radiation IMRT Course:   01/07/2012-02/18/2012 Site/dose:  1. The enhancing foci of centrally necrotic gross disease (gross target volume-GTV) plus the surrounding edema (clinical target volume-CTV) was treated to  44 gray in 22 fractions of 2 gray  2. The enhancing foci of centrally necrotic gross disease (gross target volume-GTV) alone was boosted to 60 gray with 8 additional fractions of 2 gray.  Recent scan: 01/14/14  Narrative:  The patient returns today for routine follow-up.  The recent films were presented in our multidisciplinary conference with neuroradiology just prior to the clinic. Reports she was seen at [facility] on Monday for her every two month follow up and received a good report. Reports intermittent dizziness felt since last follow up has resolved. Denies taking Temodar since May. Denies headache, nausea, vomiting, diplopia or ringing in the ears. Denies pain. Heart rate slightly elevated. Weight stable                               ALLERGIES:  is allergic to codeine and compazine.  Meds: Current Outpatient Prescriptions  Medication Sig Dispense Refill  . buPROPion (WELLBUTRIN XL) 300 MG 24 hr tablet Take 1 tablet (300 mg total) by mouth every morning.    . calcium-vitamin D (CALCIUM + D) 250-125 MG-UNIT per tablet Take 1 tablet by mouth 2 (two) times daily.    . cholecalciferol (VITAMIN D) 1000 UNITS tablet Take 1,000 Units by mouth daily.    . clonazePAM (KLONOPIN) 0.5 MG tablet Take 0.5 mg by mouth at bedtime as needed for anxiety.    [name] (ATIVAN) 1 MG  tablet Take 1 mg by mouth every 6 (six) hours as needed for anxiety.    . Melatonin 1 MG CAPS Take 3 capsules by mouth at bedtime as needed.     . Multiple Vitamin (MULTIVITAMIN) tablet Take 1 tablet by mouth daily.    . pantoprazole (PROTONIX) 40 MG tablet Take 40 mg by mouth daily.    . simvastatin (ZOCOR) 40 MG tablet Take 40 mg by mouth daily at 6 PM.    . traMADol (ULTRAM) 50 MG tablet every 6 (six) hours as needed.    . tretinoin (RETIN-A) 0.05 % cream Apply topically at bedtime.    Marland Kitchen trifluridine (VIROPTIC) 1 % ophthalmic solution     . ondansetron (ZOFRAN-ODT) 8 MG disintegrating tablet Take 8 mg by mouth every 8 (eight)  hours as needed for nausea.    . promethazine (PHENERGAN) 25 MG suppository Place 25 mg rectally every 6 (six) hours as needed for nausea.     No current facility-administered medications for this encounter.    Physical Findings: The patient is in no acute distress. Patient is alert and oriented.  weight is 170 lb (77.111 kg). Her blood pressure is 127/72 and her pulse is 105. Her respiration is 16. .  No significant changes.  Lab Findings: Lab Results  Component Value Date   WBC 5.8 09/11/2013   HGB 14.1 09/11/2013   HCT 41.9 09/11/2013   MCV 92.3 09/11/2013   PLT 248 09/11/2013    @LASTCHEM @  Radiographic Findings: Mr Kizzie Fantasia Contrast  01/14/2014   CLINICAL DATA:  Glioblastoma treated with surgery and radiation. Twenty-six month restaging.  EXAM: MRI HEAD WITHOUT AND WITH CONTRAST  TECHNIQUE: Multiplanar, multiecho pulse sequences of the brain and surrounding structures were obtained without and with intravenous contrast.  CONTRAST:  26mL MULTIHANCE GADOBENATE DIMEGLUMINE 529 MG/ML IV SOLN  COMPARISON:  11/11/2013 and multiple previous  FINDINGS: Diffusion imaging does not show any acute or subacute infarction or other cause of restricted diffusion. The brainstem and cerebellum remain normal. Right cerebral hemisphere shows abnormal white matter signal related to previous radiation but no other pathologic finding.  There is more extensive white matter radiation change in the left hemisphere. There has been brain resection in the left temporal lobe with some residual hemosiderin. 3 x 4 mm focus of enhancement in the subcortical white matter of the left frontal lobe is unchanged. Left frontal venous angioma appears the same.  No obstructive hydrocephalus. No extra-axial fluid collection. No inflammatory sinus disease. Venous sinuses are patent.  IMPRESSION: No change since the previous study. Stable 3 x 4 mm focus of enhancement in the left frontal white matter. Stable postoperative  changes of the left temporal lobe. Stable white matter signal related to previous radiation.   Electronically Signed   By: Nelson Chimes M.D.   On: 01/14/2014 12:04    Impression:  The patient is recovering from the effects of radiation.  She has no evidence of recurrence at this time.  She does have some increase in T2 flair in the left temporal area, which could be post-radiation related, but, warrants ongoing careful attention.  Plan:  MRI in 2 months, then follow-up.  _____________________________________  Sheral Apley. Tammi Klippel, M.D.

## 2014-01-21 ENCOUNTER — Encounter: Payer: Self-pay | Admitting: Radiation Oncology

## 2014-01-21 ENCOUNTER — Ambulatory Visit
Admission: RE | Admit: 2014-01-21 | Discharge: 2014-01-21 | Disposition: A | Discharge status: Home / Self Care | Payer: Medicare Other | Source: Ambulatory Visit | Attending: Radiation Oncology | Admitting: Radiation Oncology

## 2014-01-21 VITALS — BP 127/72 | HR 105 | Resp 16 | Wt 170.0 lb

## 2014-01-21 DIAGNOSIS — D496 Neoplasm of unspecified behavior of brain: Secondary | ICD-10-CM

## 2014-01-21 NOTE — Progress Notes (Signed)
Reports she was seen at [name] on Monday for her every two month follow up and received a good report. Reports intermittent dizziness felt since last follow up has resolved. Denies taking Temodar since May. Denies headache, nausea, vomiting, diplopia or ringing in the ears. Denies pain. Heart rate slightly elevated. Weight stable.

## 2014-03-11 ENCOUNTER — Other Ambulatory Visit: Payer: Self-pay | Admitting: Radiation Therapy

## 2014-03-11 DIAGNOSIS — C719 Malignant neoplasm of brain, unspecified: Secondary | ICD-10-CM

## 2014-03-22 ENCOUNTER — Other Ambulatory Visit: Payer: Self-pay | Admitting: Radiation Therapy

## 2014-03-22 DIAGNOSIS — D496 Neoplasm of unspecified behavior of brain: Secondary | ICD-10-CM

## 2014-03-23 ENCOUNTER — Ambulatory Visit
Admission: RE | Admit: 2014-03-23 | Discharge: 2014-03-23 | Disposition: A | Discharge status: Home / Self Care | Payer: Medicare Other | Source: Ambulatory Visit | Attending: Radiation Oncology | Admitting: Radiation Oncology

## 2014-03-23 DIAGNOSIS — D496 Neoplasm of unspecified behavior of brain: Secondary | ICD-10-CM

## 2014-03-23 LAB — BUN AND CREATININE (CC13)
BUN: 12.7 mg/dL (ref 7.0–26.0)
CREATININE: 0.7 mg/dL (ref 0.6–1.1)
EGFR: 88 mL/min/{1.73_m2} — ABNORMAL LOW (ref >90)

## 2014-03-25 ENCOUNTER — Encounter: Payer: Self-pay | Admitting: Radiation Oncology

## 2014-03-25 ENCOUNTER — Ambulatory Visit
Admission: RE | Admit: 2014-03-25 | Discharge: 2014-03-25 | Disposition: A | Discharge status: Home / Self Care | Payer: Medicare Other | Source: Ambulatory Visit | Attending: Radiation Oncology | Admitting: Radiation Oncology

## 2014-03-25 DIAGNOSIS — C719 Malignant neoplasm of brain, unspecified: Secondary | ICD-10-CM

## 2014-03-25 MED ORDER — GADOBENATE DIMEGLUMINE 529 MG/ML IV SOLN
16.0000 mL | Freq: Once | INTRAVENOUS | Status: AC | PRN
Start: 2014-03-25 — End: 2014-03-25
  Administered 2014-03-25: 16 mL via INTRAVENOUS

## 2014-03-29 NOTE — Progress Notes (Signed)
  Radiation Oncology         [phone]   Name: Kelly Maxwell   Date: 03/31/2014   MRN: [id number]  DOB: Jul 30, 1945    Multidisciplinary Brain and Spine Oncology Clinic Follow-Up Visit Note  CC: Wenda Low, MD  Annia Belt, MD    ICD-9-CM ICD-10-CM   1. Brain tumor 239.6 D49.6     Diagnosis:   69 year old woman s/p partial resection of a multifocal left temporal grade IV gliosarcoma s/p radiotherapy to 60 Gy - 01/07/2012-02/18/2012   Interval Since Last Radiation:  25  months  Narrative:  The patient was seen at [facility] and cancelled this visit since her MRI looked good.  ________  Sheral Apley Tammi Klippel, M.D.

## 2014-03-30 ENCOUNTER — Telehealth: Payer: Self-pay | Admitting: Radiation Oncology

## 2014-03-30 NOTE — Telephone Encounter (Signed)
Phoned patient's home. Spoke with her husband. Patient was sleeping. He confirms his wife obtained the results of her MRI via MyChart plus [facility] confirmed the impression during her appointment with them yesterday. Husband reports his wife's only complaint is fatigue but, understands this is to be expected. Husband expressed appreciation for the call.

## 2014-03-31 ENCOUNTER — Ambulatory Visit
Admission: RE | Admit: 2014-03-31 | Discharge: 2014-03-31 | Disposition: A | Discharge status: Home / Self Care | Payer: Medicare Other | Source: Ambulatory Visit | Attending: Radiation Oncology | Admitting: Radiation Oncology

## 2014-03-31 DIAGNOSIS — D496 Neoplasm of unspecified behavior of brain: Secondary | ICD-10-CM

## 2014-05-07 ENCOUNTER — Other Ambulatory Visit: Payer: Self-pay | Admitting: Internal Medicine

## 2014-05-07 DIAGNOSIS — C719 Malignant neoplasm of brain, unspecified: Secondary | ICD-10-CM

## 2014-05-11 ENCOUNTER — Other Ambulatory Visit: Payer: Self-pay | Admitting: Radiation Therapy

## 2014-05-11 DIAGNOSIS — D496 Neoplasm of unspecified behavior of brain: Secondary | ICD-10-CM

## 2014-05-26 ENCOUNTER — Other Ambulatory Visit: Payer: Self-pay | Admitting: Radiation Therapy

## 2014-05-26 DIAGNOSIS — D496 Neoplasm of unspecified behavior of brain: Secondary | ICD-10-CM

## 2014-05-27 ENCOUNTER — Ambulatory Visit
Admission: RE | Admit: 2014-05-27 | Discharge: 2014-05-27 | Disposition: A | Discharge status: Home / Self Care | Payer: Medicare Other | Source: Ambulatory Visit | Attending: Radiation Oncology | Admitting: Radiation Oncology

## 2014-05-27 DIAGNOSIS — D496 Neoplasm of unspecified behavior of brain: Secondary | ICD-10-CM | POA: Diagnosis not present

## 2014-05-27 LAB — BUN AND CREATININE (CC13)
BUN: 19.6 mg/dL (ref 7.0–26.0)
Creatinine: 0.7 mg/dL (ref 0.6–1.1)
EGFR: 85 mL/min/{1.73_m2} — AB (ref >90)

## 2014-05-31 ENCOUNTER — Other Ambulatory Visit: Payer: Medicare Other

## 2014-06-02 ENCOUNTER — Other Ambulatory Visit: Payer: Medicare Other

## 2014-06-02 ENCOUNTER — Encounter: Payer: Self-pay | Admitting: Radiation Oncology

## 2014-06-02 ENCOUNTER — Ambulatory Visit
Admission: RE | Admit: 2014-06-02 | Discharge: 2014-06-02 | Disposition: A | Discharge status: Home / Self Care | Payer: Medicare Other | Source: Ambulatory Visit | Attending: Radiation Oncology | Admitting: Radiation Oncology

## 2014-06-02 DIAGNOSIS — D496 Neoplasm of unspecified behavior of brain: Secondary | ICD-10-CM

## 2014-06-02 MED ORDER — GADOBENATE DIMEGLUMINE 529 MG/ML IV SOLN
16.0000 mL | Freq: Once | INTRAVENOUS | Status: AC | PRN
  Administered 2014-06-02: 16 mL via INTRAVENOUS

## 2014-06-07 ENCOUNTER — Ambulatory Visit: Payer: Medicare Other | Admitting: Radiation Oncology

## 2014-06-08 ENCOUNTER — Other Ambulatory Visit: Payer: Self-pay | Admitting: Radiation Therapy

## 2014-06-08 DIAGNOSIS — D496 Neoplasm of unspecified behavior of brain: Secondary | ICD-10-CM

## 2014-06-09 ENCOUNTER — Other Ambulatory Visit: Payer: Self-pay | Admitting: Radiation Therapy

## 2014-06-09 DIAGNOSIS — D496 Neoplasm of unspecified behavior of brain: Secondary | ICD-10-CM

## 2014-06-22 ENCOUNTER — Ambulatory Visit
Admission: RE | Admit: 2014-06-22 | Discharge: 2014-06-22 | Disposition: A | Discharge status: Home / Self Care | Payer: Medicare Other | Source: Ambulatory Visit | Attending: Internal Medicine | Admitting: Internal Medicine

## 2014-06-22 ENCOUNTER — Other Ambulatory Visit: Payer: Self-pay | Admitting: Internal Medicine

## 2014-06-22 ENCOUNTER — Other Ambulatory Visit: Payer: Self-pay

## 2014-06-22 DIAGNOSIS — R5382 Chronic fatigue, unspecified: Secondary | ICD-10-CM

## 2014-06-22 DIAGNOSIS — Z1231 Encounter for screening mammogram for malignant neoplasm of breast: Secondary | ICD-10-CM

## 2014-06-25 ENCOUNTER — Ambulatory Visit
Admission: RE | Admit: 2014-06-25 | Discharge: 2014-06-25 | Disposition: A | Discharge status: Home / Self Care | Payer: Medicare Other | Source: Ambulatory Visit

## 2014-06-25 DIAGNOSIS — Z1231 Encounter for screening mammogram for malignant neoplasm of breast: Secondary | ICD-10-CM

## 2014-07-29 ENCOUNTER — Ambulatory Visit
Admission: RE | Admit: 2014-07-29 | Discharge: 2014-07-29 | Disposition: A | Discharge status: Home / Self Care | Payer: Medicare Other | Source: Ambulatory Visit | Attending: Radiation Oncology | Admitting: Radiation Oncology

## 2014-07-29 DIAGNOSIS — D496 Neoplasm of unspecified behavior of brain: Secondary | ICD-10-CM | POA: Insufficient documentation

## 2014-07-29 LAB — BUN AND CREATININE (CC13)
BUN: 19.2 mg/dL (ref 7.0–26.0)
CREATININE: 0.7 mg/dL (ref 0.6–1.1)
EGFR: 87 mL/min/{1.73_m2} — AB (ref >90)

## 2014-08-03 ENCOUNTER — Encounter: Payer: Self-pay | Admitting: Radiation Oncology

## 2014-08-03 ENCOUNTER — Ambulatory Visit
Admission: RE | Admit: 2014-08-03 | Discharge: 2014-08-03 | Disposition: A | Discharge status: Home / Self Care | Payer: Medicare Other | Source: Ambulatory Visit | Attending: Radiation Oncology | Admitting: Radiation Oncology

## 2014-08-03 DIAGNOSIS — D496 Neoplasm of unspecified behavior of brain: Secondary | ICD-10-CM

## 2014-08-03 MED ORDER — GADOBENATE DIMEGLUMINE 529 MG/ML IV SOLN
16.0000 mL | Freq: Once | INTRAVENOUS | Status: AC | PRN
  Administered 2014-08-03: 16 mL via INTRAVENOUS

## 2014-08-04 ENCOUNTER — Telehealth: Payer: Self-pay | Admitting: Radiation Oncology

## 2014-08-04 NOTE — Telephone Encounter (Addendum)
Per Dr. Johny Shears order phoned patient with normal results of MRI. Patient verbalized understanding and appreciation for the call. Patient without complaints. Patient requested to cancel 6/6 follow up with Dr. Johny Shears approval. Informed patient this RN will call her back with his response about cancelling her follow up on 6/6.

## 2014-08-06 ENCOUNTER — Other Ambulatory Visit: Payer: Medicare Other

## 2014-08-09 ENCOUNTER — Ambulatory Visit
Admission: RE | Admit: 2014-08-09 | Discharge: 2014-08-09 | Disposition: A | Discharge status: Home / Self Care | Payer: Medicare Other | Source: Ambulatory Visit | Attending: Radiation Oncology | Admitting: Radiation Oncology

## 2014-08-09 ENCOUNTER — Ambulatory Visit: Payer: Medicare Other | Admitting: Radiation Oncology

## 2014-08-09 ENCOUNTER — Ambulatory Visit: Admission: RE | Admit: 2014-08-09 | Payer: Medicare Other | Source: Ambulatory Visit | Admitting: Radiation Oncology

## 2014-10-07 ENCOUNTER — Other Ambulatory Visit: Payer: Self-pay | Admitting: Radiation Therapy

## 2014-10-07 ENCOUNTER — Encounter: Payer: Self-pay | Admitting: Radiation Therapy

## 2014-10-07 DIAGNOSIS — D496 Neoplasm of unspecified behavior of brain: Secondary | ICD-10-CM

## 2014-10-07 NOTE — Progress Notes (Signed)
1.  Do you need a wheel chair?    No  2. On oxygen?   No  3. Have you ever had any surgery in the body part being scanned?  Yes 12/17/11 Craniotomy with Tumor excision by Dr. Luiz Ochoa   4. Have you ever had any surgery on your brain or heart?                     Brain, see answer to #2  5. Have you ever had surgery on your eyes or ears?               No  6. Do you have a pacemaker or defibrillator?   No  7. Do you have a Neurostimulator?    No  8. Claustrophobic?  No  9. Any risk for metal in eyes?  No  10. Injury by bullet, buckshot, or shrapnel?  No  11. Stent?      No                                                                                                                     12. Hx of Cancer?    Primary Brain Tumor, Grade IV Gliosarcoma                                                                                                          13. Kidney or Liver disease?  No  14. Hx of Lupus, Rheumatoid Arthritis or Scleroderma?  No  15. IV Antibiotics or long term use of NSAIDS?  No  16. HX of Hypertension?  No  17. Diabetes?  No  18. Allergy to contrast?  No  19. Recent labs. To be drawn at the [facility] 10/29/14  [place]

## 2014-10-29 ENCOUNTER — Ambulatory Visit
Admission: RE | Admit: 2014-10-29 | Discharge: 2014-10-29 | Disposition: A | Discharge status: Home / Self Care | Payer: Medicare Other | Source: Ambulatory Visit | Attending: Radiation Oncology | Admitting: Radiation Oncology

## 2014-10-29 DIAGNOSIS — D496 Neoplasm of unspecified behavior of brain: Secondary | ICD-10-CM | POA: Diagnosis not present

## 2014-10-29 LAB — BUN AND CREATININE (CC13)
BUN: 13.3 mg/dL (ref 7.0–26.0)
CREATININE: 0.8 mg/dL (ref 0.6–1.1)
EGFR: 79 mL/min/{1.73_m2} — ABNORMAL LOW (ref >90)

## 2014-11-01 ENCOUNTER — Ambulatory Visit
Admission: RE | Admit: 2014-11-01 | Discharge: 2014-11-01 | Disposition: A | Discharge status: Home / Self Care | Payer: Medicare Other | Source: Ambulatory Visit | Attending: Radiation Oncology | Admitting: Radiation Oncology

## 2014-11-01 ENCOUNTER — Encounter: Payer: Self-pay | Admitting: Radiation Oncology

## 2014-11-01 DIAGNOSIS — D496 Neoplasm of unspecified behavior of brain: Secondary | ICD-10-CM

## 2014-11-01 MED ORDER — GADOBENATE DIMEGLUMINE 529 MG/ML IV SOLN
16.0000 mL | Freq: Once | INTRAVENOUS | Status: AC | PRN
  Administered 2014-11-01: 16 mL via INTRAVENOUS

## 2014-11-03 ENCOUNTER — Encounter: Payer: Self-pay | Admitting: Radiation Oncology

## 2014-11-04 ENCOUNTER — Telehealth: Payer: Self-pay | Admitting: Radiation Oncology

## 2014-11-04 NOTE — Telephone Encounter (Signed)
Phoned patient to inform her of good results from recent scan. No answer. Left message for patient to check MyChart account.

## 2015-02-01 ENCOUNTER — Other Ambulatory Visit: Payer: Self-pay | Admitting: Radiation Therapy

## 2015-02-01 ENCOUNTER — Encounter: Payer: Self-pay | Admitting: Radiation Therapy

## 2015-02-01 DIAGNOSIS — C719 Malignant neoplasm of brain, unspecified: Secondary | ICD-10-CM

## 2015-02-01 NOTE — Progress Notes (Signed)
1.  Do you need a wheel chair?  no    2. On oxygen?   no  3. Have you ever had any surgery in the body part being scanned?  Yes tumor excision 12/17/11 by Dr. Hazle Coca here at [facility]   4. Have you ever had any surgery on your brain or heart?                       Brain- see #3   5. Have you ever had surgery on your eyes or ears?                               no  6. Do you have a pacemaker or defibrillator?   no  7. Do you have a Neurostimulator?      no        8. Claustrophobic?  no  9. Any risk for metal in eyes?  no  10. Injury by bullet, buckshot, or shrapnel?  no  11. Stent?   no                                                                                                                      12. Hx of Cancer?   Yes Grade IV Gliosarcoma of the brain                                                                                                             13. Kidney or Liver disease?  no  14. Hx of Lupus, Rheumatoid Arthritis or Scleroderma?  no  15. IV Antibiotics or long term use of NSAIDS?  no  16. HX of Hypertension?  no  17. Diabetes?  no  18. Allergy to contrast?  no  19. Recent labs. - no will need them prior to the scan.

## 2015-02-04 ENCOUNTER — Encounter: Payer: Self-pay | Admitting: Radiation Oncology

## 2015-02-04 ENCOUNTER — Ambulatory Visit
Admission: RE | Admit: 2015-02-04 | Discharge: 2015-02-04 | Disposition: A | Discharge status: Home / Self Care | Payer: Medicare Other | Source: Ambulatory Visit | Attending: Radiation Oncology | Admitting: Radiation Oncology

## 2015-02-04 DIAGNOSIS — C719 Malignant neoplasm of brain, unspecified: Secondary | ICD-10-CM

## 2015-02-04 MED ORDER — GADOBENATE DIMEGLUMINE 529 MG/ML IV SOLN
17.0000 mL | Freq: Once | INTRAVENOUS | Status: AC | PRN
  Administered 2015-02-04: 17 mL via INTRAVENOUS

## 2015-02-07 ENCOUNTER — Telehealth: Payer: Self-pay | Admitting: Radiation Oncology

## 2015-02-07 NOTE — Telephone Encounter (Signed)
Phoned patient's home to inform her that her MRI is normal per Dr. Johny Shears order. Patient sleeping. Spoke with her husband and he plans to relay the results to her. Shanon Brow, the husband, expressed appreciation for the call.

## 2015-02-14 ENCOUNTER — Ambulatory Visit: Admission: RE | Admit: 2015-02-14 | Payer: Medicare Other | Source: Ambulatory Visit | Admitting: Radiation Oncology

## 2015-02-14 ENCOUNTER — Ambulatory Visit
Admission: RE | Admit: 2015-02-14 | Discharge: 2015-02-14 | Disposition: A | Discharge status: Home / Self Care | Payer: Medicare Other | Source: Ambulatory Visit | Attending: Radiation Oncology | Admitting: Radiation Oncology

## 2015-04-06 ENCOUNTER — Other Ambulatory Visit: Payer: Self-pay | Admitting: Radiation Therapy

## 2015-04-06 DIAGNOSIS — D496 Neoplasm of unspecified behavior of brain: Secondary | ICD-10-CM

## 2015-04-08 ENCOUNTER — Encounter: Payer: Self-pay | Admitting: *Deleted

## 2015-04-08 NOTE — Progress Notes (Signed)
[place] Work  Clinical Social Work was referred by patient for assessment of psychosocial needs due to counseling request.  Clinical Social Worker contacted patient at home to offer support and assess for needs. CSW assessed needs and explained available resources. Pt reports her 72 yo granddaughter is in need of counseling. Pt states it is not in relation to her own illness, grand daughter is depressed. CSW provided pt with local community resources to help find a counselor and explained that counseling resources at [facility] are for patients and families related to cancer diagnosis. She stated understanding and will look into these other resource options. Pt denied other needs currently.     Loren Racer, [place] Worker [place]  [place] Phone: [phone] Fax: [phone]

## 2015-05-05 ENCOUNTER — Ambulatory Visit
Admission: RE | Admit: 2015-05-05 | Discharge: 2015-05-05 | Disposition: A | Discharge status: Home / Self Care | Payer: Medicare Other | Source: Ambulatory Visit | Attending: Radiation Oncology | Admitting: Radiation Oncology

## 2015-05-05 DIAGNOSIS — D496 Neoplasm of unspecified behavior of brain: Secondary | ICD-10-CM

## 2015-05-05 MED ORDER — GADOBENATE DIMEGLUMINE 529 MG/ML IV SOLN
16.0000 mL | Freq: Once | INTRAVENOUS | Status: AC | PRN
  Administered 2015-05-05: 16 mL via INTRAVENOUS

## 2015-05-09 ENCOUNTER — Ambulatory Visit: Payer: Medicare Other | Admitting: Radiation Oncology

## 2015-05-13 ENCOUNTER — Ambulatory Visit: Payer: Medicare Other | Admitting: Radiation Oncology

## 2015-08-18 ENCOUNTER — Other Ambulatory Visit: Payer: Self-pay | Admitting: Radiation Therapy

## 2015-08-18 DIAGNOSIS — D496 Neoplasm of unspecified behavior of brain: Secondary | ICD-10-CM

## 2015-09-02 ENCOUNTER — Emergency Department (HOSPITAL_COMMUNITY): Payer: Medicare Other

## 2015-09-02 ENCOUNTER — Encounter (HOSPITAL_COMMUNITY): Payer: Self-pay | Admitting: Emergency Medicine

## 2015-09-02 ENCOUNTER — Inpatient Hospital Stay (HOSPITAL_COMMUNITY)
Admission: EM | Admit: 2015-09-02 | Discharge: 2015-09-06 | DRG: 353 | Disposition: A | Discharge status: Home Health | Payer: Medicare Other | Attending: Surgery | Admitting: Surgery

## 2015-09-02 DIAGNOSIS — K460 Unspecified abdominal hernia with obstruction, without gangrene: Secondary | ICD-10-CM

## 2015-09-02 DIAGNOSIS — F419 Anxiety disorder, unspecified: Secondary | ICD-10-CM | POA: Diagnosis present

## 2015-09-02 DIAGNOSIS — J95821 Acute postprocedural respiratory failure: Secondary | ICD-10-CM | POA: Diagnosis not present

## 2015-09-02 DIAGNOSIS — K449 Diaphragmatic hernia without obstruction or gangrene: Secondary | ICD-10-CM | POA: Diagnosis present

## 2015-09-02 DIAGNOSIS — Z8249 Family history of ischemic heart disease and other diseases of the circulatory system: Secondary | ICD-10-CM

## 2015-09-02 DIAGNOSIS — E785 Hyperlipidemia, unspecified: Secondary | ICD-10-CM | POA: Diagnosis present

## 2015-09-02 DIAGNOSIS — Z923 Personal history of irradiation: Secondary | ICD-10-CM

## 2015-09-02 DIAGNOSIS — K436 Other and unspecified ventral hernia with obstruction, without gangrene: Secondary | ICD-10-CM | POA: Diagnosis not present

## 2015-09-02 DIAGNOSIS — Z79899 Other long term (current) drug therapy: Secondary | ICD-10-CM

## 2015-09-02 DIAGNOSIS — E8809 Other disorders of plasma-protein metabolism, not elsewhere classified: Secondary | ICD-10-CM | POA: Diagnosis present

## 2015-09-02 DIAGNOSIS — F32A Depression, unspecified: Secondary | ICD-10-CM | POA: Diagnosis present

## 2015-09-02 DIAGNOSIS — R112 Nausea with vomiting, unspecified: Secondary | ICD-10-CM | POA: Diagnosis present

## 2015-09-02 DIAGNOSIS — Z01818 Encounter for other preprocedural examination: Secondary | ICD-10-CM

## 2015-09-02 DIAGNOSIS — Z96652 Presence of left artificial knee joint: Secondary | ICD-10-CM | POA: Diagnosis present

## 2015-09-02 DIAGNOSIS — Z9889 Other specified postprocedural states: Secondary | ICD-10-CM

## 2015-09-02 DIAGNOSIS — Z85841 Personal history of malignant neoplasm of brain: Secondary | ICD-10-CM

## 2015-09-02 DIAGNOSIS — F329 Major depressive disorder, single episode, unspecified: Secondary | ICD-10-CM | POA: Diagnosis present

## 2015-09-02 DIAGNOSIS — K5900 Constipation, unspecified: Secondary | ICD-10-CM | POA: Diagnosis present

## 2015-09-02 DIAGNOSIS — T481X5A Adverse effect of skeletal muscle relaxants [neuromuscular blocking agents], initial encounter: Secondary | ICD-10-CM | POA: Diagnosis not present

## 2015-09-02 DIAGNOSIS — K439 Ventral hernia without obstruction or gangrene: Secondary | ICD-10-CM | POA: Diagnosis present

## 2015-09-02 DIAGNOSIS — K219 Gastro-esophageal reflux disease without esophagitis: Secondary | ICD-10-CM | POA: Diagnosis present

## 2015-09-02 HISTORY — DX: Ventral hernia without obstruction or gangrene: K43.9

## 2015-09-02 HISTORY — DX: Other disorders of plasma-protein metabolism, not elsewhere classified: E88.09

## 2015-09-02 LAB — URINALYSIS, ROUTINE W REFLEX MICROSCOPIC
BILIRUBIN URINE: NEGATIVE
Glucose, UA: NEGATIVE mg/dL
KETONES UR: NEGATIVE mg/dL
Leukocytes, UA: NEGATIVE
Nitrite: NEGATIVE
PROTEIN: NEGATIVE mg/dL
Specific Gravity, Urine: 1.018 (ref 1.005–1.030)
pH: 6 (ref 5.0–8.0)

## 2015-09-02 LAB — COMPREHENSIVE METABOLIC PANEL
ALBUMIN: 4.1 g/dL (ref 3.5–5.0)
ALT: 24 U/L (ref 14–54)
ANION GAP: 5 (ref 5–15)
AST: 18 U/L (ref 15–41)
Alkaline Phosphatase: 89 U/L (ref 38–126)
BUN: 15 mg/dL (ref 6–20)
CO2: 22 mmol/L (ref 22–32)
CREATININE: 0.57 mg/dL (ref 0.44–1.00)
Calcium: 9.1 mg/dL (ref 8.9–10.3)
Chloride: 111 mmol/L (ref 101–111)
GLUCOSE: 94 mg/dL (ref 65–99)
Potassium: 3.9 mmol/L (ref 3.5–5.1)
Sodium: 138 mmol/L (ref 135–145)
TOTAL PROTEIN: 6.7 g/dL (ref 6.5–8.1)
Total Bilirubin: 1.1 mg/dL (ref 0.3–1.2)

## 2015-09-02 LAB — URINE MICROSCOPIC-ADD ON

## 2015-09-02 LAB — CBC WITH DIFFERENTIAL/PLATELET
Basophils Absolute: 0 10*3/uL (ref 0.0–0.1)
Basophils Relative: 0 %
EOS ABS: 0.1 10*3/uL (ref 0.0–0.7)
EOS PCT: 2 %
HCT: 43.4 % (ref 36.0–46.0)
Hemoglobin: 14.4 g/dL (ref 12.0–15.0)
LYMPHS ABS: 1.3 10*3/uL (ref 0.7–4.0)
LYMPHS PCT: 14 %
MCH: 29.9 pg (ref 26.0–34.0)
MCHC: 33.2 g/dL (ref 30.0–36.0)
MCV: 90 fL (ref 78.0–100.0)
MONO ABS: 0.5 10*3/uL (ref 0.1–1.0)
Monocytes Relative: 5 %
Neutro Abs: 7.1 10*3/uL (ref 1.7–7.7)
Neutrophils Relative %: 79 %
PLATELETS: 256 10*3/uL (ref 150–400)
RBC: 4.82 MIL/uL (ref 3.87–5.11)
RDW: 14.6 % (ref 11.5–15.5)
WBC: 9 10*3/uL (ref 4.0–10.5)

## 2015-09-02 LAB — LIPASE, BLOOD: Lipase: 31 U/L (ref 11–51)

## 2015-09-02 MED ORDER — ACETAMINOPHEN 325 MG PO TABS
650.0000 mg | ORAL_TABLET | Freq: Once | ORAL | Status: AC
  Administered 2015-09-02: 650 mg via ORAL
  Filled 2015-09-02: qty 2

## 2015-09-02 MED ORDER — SODIUM CHLORIDE 0.9 % IV BOLUS (SEPSIS)
500.0000 mL | Freq: Once | INTRAVENOUS | Status: AC
  Administered 2015-09-02: 500 mL via INTRAVENOUS

## 2015-09-02 MED ORDER — IOPAMIDOL (ISOVUE-300) INJECTION 61%
100.0000 mL | Freq: Once | INTRAVENOUS | Status: AC | PRN
  Administered 2015-09-02: 100 mL via INTRAVENOUS

## 2015-09-02 NOTE — ED Provider Notes (Signed)
CSN: [id number]     Arrival date & time 09/02/15  1905 History   First MD Initiated Contact with Patient 09/02/15 2033     Chief Complaint  Patient presents with  . Constipation    last BM 08/29/2015     Patient is a 70 y.o. female presenting with constipation. The history is provided by the patient. No language interpreter was used.  Constipation  Kelly Maxwell is a 70 y.o. female who presents to the Emergency Department complaining of constipation.  She reports 1 week of constipation with left lower quadrant abdominal pain. She has a hernia that is in the left lower quadrant that has not bothered her for years but she's noticed intermittent swelling and pain in this area. She has nausea and decreased appetite. She denies any fevers. No dysuria. No prior history of similar symptoms. She has tried multiple over-the-counter agents including [employer] stool softener and gentle laxatives as well as 2 fleets enemas without improvement in her symptoms. He has a history of glioblastoma versus gluteus or, that is being followed by [facility]. The tumor is thought to be stable on her last evaluation. History of abdominal surgeries and no recent medication changes.  Past Medical History  Diagnosis Date  . PONV (postoperative nausea and vomiting)   . Depression   . GERD (gastroesophageal reflux disease)   . Headache(784.0)   . HLD (hyperlipidemia)   . Brain tumor ([place]) 12/18/2011    Left temporal lobe 2.7 x 3.6 x 2.5 cm ring enhancing; numerous small surrounding satellite lesions 12/13/11  . Glial neoplasm of brain ([place]) 12/17/11    left temporal, grade IV  . Hiatal hernia   . History of radiation therapy 01/07/2012-02/18/12    left temporal  58GY  . Cancer [facility])    Past Surgical History  Procedure Laterality Date  . Total knee arthroplasty  2000    left  . Bunionectomy  2001    right  . Craniotomy  12/17/2011    Procedure: CRANIOTOMY TUMOR EXCISION;  Surgeon: Otilio Connors, MD;  Location: [facility];  Service: Neurosurgery;  Laterality: Left;  LEFT temporal craniotomy with stealth for tumor resection  . Dilation and curettage of uterus  2007  . Cataract extraction Left    Family History  Problem Relation Age of Onset  . Heart disease Mother    Social History  Substance Use Topics  . Smoking status: Never Smoker   . Smokeless tobacco: Never Used  . Alcohol Use: None   OB History    No data available     Review of Systems  Gastrointestinal: Positive for constipation.  All other systems reviewed and are negative.     Allergies  Codeine and Compazine  Home Medications   Prior to Admission medications   Medication Sig Start Date End Date Taking? Authorizing Provider  buPROPion (WELLBUTRIN XL) 300 MG 24 hr tablet Take 1 tablet (300 mg total) by mouth every morning. 12/14/11  Yes Consuela Mimes, MD  calcium-vitamin D (CALCIUM + D) 250-125 MG-UNIT per tablet Take 1 tablet by mouth 2 (two) times daily. Patient taking differently: Take 1 tablet by mouth daily.  05/26/13  Yes Consuela Mimes, MD  clonazePAM (KLONOPIN) 0.5 MG tablet Take 0.5 mg by mouth at bedtime as needed for anxiety.   Yes Historical Provider, MD  LORazepam (ATIVAN) 1 MG tablet Take 1 mg by mouth every 6 (six) hours as needed for anxiety.   Yes Historical Provider, MD  Melatonin  1 MG CAPS Take 3 capsules by mouth at bedtime as needed (sleep).    Yes Historical Provider, MD  Multiple Vitamin (MULTIVITAMIN) tablet Take 1 tablet by mouth daily.   Yes Historical Provider, MD  pantoprazole (PROTONIX) 40 MG tablet Take 40 mg by mouth daily as needed (heartburn/ acid reflux).    Yes Historical Provider, MD  simvastatin (ZOCOR) 40 MG tablet Take 20 mg by mouth daily at 6 PM.    Yes Historical Provider, MD  tretinoin (RETIN-A) 0.05 % cream Apply 1 application topically at bedtime as needed. spots 06/24/15  Yes Historical Provider, MD  valACYclovir (VALTREX) 500 MG tablet Take 500 mg by mouth 2 (two) times daily. 08/17/15   Yes Historical Provider, MD  Bromfenac Sodium (PROLENSA) 0.07 % SOLN Place 1 drop into the right eye 3 (three) times daily. Reported on 09/02/2015    Historical Provider, MD  Difluprednate (DUREZOL OP) Place 1 drop into the right eye 2 (two) times daily. Reported on 09/02/2015    Historical Provider, MD   BP 162/68 mmHg  Pulse 61  Temp(Src) 98.5 F (36.9 C) (Oral)  Resp 18  Ht 5\' 5"  (1.651 m)  Wt 180 lb (81.647 kg)  BMI 29.95 kg/m2  SpO2 97% Physical Exam  Constitutional: She is oriented to person, place, and time. She appears well-developed and well-nourished.  HENT:  Head: Normocephalic and atraumatic.  Cardiovascular: Normal rate and regular rhythm.   No murmur heard. Pulmonary/Chest: Effort normal and breath sounds normal. No respiratory distress.  Abdominal: Soft. There is no rebound and no guarding.  Mild left lower quadrant abdominal tenderness. There is a firm mass in the left lower quadrant consistent with hernia that is partially reducible on examination.  Genitourinary:  External hemorrhoids, empty rectal vault  Musculoskeletal: She exhibits no edema or tenderness.  Neurological: She is alert and oriented to person, place, and time.  Skin: Skin is warm and dry.  Psychiatric: She has a normal mood and affect. Her behavior is normal.  Nursing note and vitals reviewed.   ED Course  Procedures (including critical care time) Labs Review Labs Reviewed  URINALYSIS, ROUTINE W REFLEX MICROSCOPIC (NOT AT [facility]) - Abnormal; Notable for the following:    Hgb urine dipstick TRACE (*)    All other components within normal limits  URINE MICROSCOPIC-ADD ON - Abnormal; Notable for the following:    Squamous Epithelial / LPF 0-5 (*)    Bacteria, UA RARE (*)    Crystals CA OXALATE CRYSTALS (*)    All other components within normal limits  COMPREHENSIVE METABOLIC PANEL  CBC WITH DIFFERENTIAL/PLATELET  LIPASE, BLOOD    Imaging Review No results found. I have personally reviewed  and evaluated these images and lab results as part of my medical decision-making.   EKG Interpretation None      MDM   Final diagnoses:  Incarcerated hernia  Patient here for evaluation of left lower quadrant abdominal pain, hernia on examination and constipation. Her hernia is partially reducible in the emergency department. CT scan pending for further evaluation.  CT scan with evidence of incarceration of hernia.  General Surgery consulted for further mgmt.  Pt updated of findings and need for admission for further treatment.    [name], MD 09/03/15 [phone]

## 2015-09-02 NOTE — ED Notes (Addendum)
Patient c/o constipation, last BM on Monday "light", last normal BM per patient was last weekend. Patient has used enema x2 at home, stool softeners, laxatives. Patient also reports shes been taking Aleve. Patient has an left abdominal hernia, states that is also causing her some discomfort that is unusual. Patient reports she is able to pass gas. Patient states when her pain was worse she was also very nauseated, but did not vomit.

## 2015-09-02 NOTE — ED Notes (Signed)
Below order not completed by EW. 

## 2015-09-03 ENCOUNTER — Emergency Department (HOSPITAL_COMMUNITY): Payer: Medicare Other | Admitting: Anesthesiology

## 2015-09-03 ENCOUNTER — Emergency Department (HOSPITAL_COMMUNITY): Payer: Medicare Other

## 2015-09-03 ENCOUNTER — Encounter (HOSPITAL_COMMUNITY): Admission: EM | Disposition: A | Discharge status: Home Health | Payer: Self-pay | Source: Home / Self Care

## 2015-09-03 ENCOUNTER — Encounter (HOSPITAL_COMMUNITY): Payer: Self-pay | Admitting: Surgery

## 2015-09-03 DIAGNOSIS — E8809 Other disorders of plasma-protein metabolism, not elsewhere classified: Secondary | ICD-10-CM

## 2015-09-03 DIAGNOSIS — F32A Depression, unspecified: Secondary | ICD-10-CM | POA: Diagnosis present

## 2015-09-03 DIAGNOSIS — Z85841 Personal history of malignant neoplasm of brain: Secondary | ICD-10-CM | POA: Diagnosis not present

## 2015-09-03 DIAGNOSIS — K460 Unspecified abdominal hernia with obstruction, without gangrene: Secondary | ICD-10-CM

## 2015-09-03 DIAGNOSIS — F419 Anxiety disorder, unspecified: Secondary | ICD-10-CM | POA: Diagnosis present

## 2015-09-03 DIAGNOSIS — F329 Major depressive disorder, single episode, unspecified: Secondary | ICD-10-CM | POA: Diagnosis present

## 2015-09-03 DIAGNOSIS — K5900 Constipation, unspecified: Secondary | ICD-10-CM | POA: Diagnosis present

## 2015-09-03 DIAGNOSIS — K449 Diaphragmatic hernia without obstruction or gangrene: Secondary | ICD-10-CM | POA: Diagnosis present

## 2015-09-03 DIAGNOSIS — J95821 Acute postprocedural respiratory failure: Secondary | ICD-10-CM

## 2015-09-03 DIAGNOSIS — Z8619 Personal history of other infectious and parasitic diseases: Secondary | ICD-10-CM | POA: Insufficient documentation

## 2015-09-03 DIAGNOSIS — K436 Other and unspecified ventral hernia with obstruction, without gangrene: Secondary | ICD-10-CM | POA: Diagnosis present

## 2015-09-03 DIAGNOSIS — T481X5A Adverse effect of skeletal muscle relaxants [neuromuscular blocking agents], initial encounter: Secondary | ICD-10-CM | POA: Diagnosis not present

## 2015-09-03 DIAGNOSIS — E785 Hyperlipidemia, unspecified: Secondary | ICD-10-CM | POA: Diagnosis present

## 2015-09-03 DIAGNOSIS — Z923 Personal history of irradiation: Secondary | ICD-10-CM | POA: Diagnosis not present

## 2015-09-03 DIAGNOSIS — Z79899 Other long term (current) drug therapy: Secondary | ICD-10-CM | POA: Diagnosis not present

## 2015-09-03 DIAGNOSIS — Z96652 Presence of left artificial knee joint: Secondary | ICD-10-CM | POA: Diagnosis present

## 2015-09-03 DIAGNOSIS — Z8249 Family history of ischemic heart disease and other diseases of the circulatory system: Secondary | ICD-10-CM | POA: Diagnosis not present

## 2015-09-03 DIAGNOSIS — K219 Gastro-esophageal reflux disease without esophagitis: Secondary | ICD-10-CM | POA: Diagnosis present

## 2015-09-03 HISTORY — PX: VENTRAL HERNIA REPAIR: SHX424

## 2015-09-03 HISTORY — DX: Other disorders of plasma-protein metabolism, not elsewhere classified: E88.09

## 2015-09-03 LAB — BLOOD GAS, ARTERIAL
Acid-base deficit: 2.2 mmol/L — ABNORMAL HIGH (ref 0.0–2.0)
Bicarbonate: 21.7 mEq/L (ref 20.0–24.0)
DRAWN BY: 232811
FIO2: 0.3
MECHVT: 460 mL
O2 SAT: 94.2 %
PEEP: 5 cmH2O
PH ART: 7.398 (ref 7.350–7.450)
Patient temperature: 97.5
RATE: 18 resp/min
TCO2: 19.1 mmol/L (ref 0–100)
pCO2 arterial: 35.7 mmHg (ref 35.0–45.0)
pO2, Arterial: 69.2 mmHg — ABNORMAL LOW (ref 80.0–100.0)

## 2015-09-03 LAB — MRSA PCR SCREENING: MRSA by PCR: NEGATIVE

## 2015-09-03 LAB — TRIGLYCERIDES: TRIGLYCERIDES: 163 mg/dL — AB (ref <150)

## 2015-09-03 LAB — GLUCOSE, CAPILLARY: Glucose-Capillary: 123 mg/dL — ABNORMAL HIGH (ref 65–99)

## 2015-09-03 SURGERY — REPAIR, HERNIA, VENTRAL, LAPAROSCOPIC
Anesthesia: General | Site: Abdomen

## 2015-09-03 MED ORDER — LIDOCAINE HCL (CARDIAC) 20 MG/ML IV SOLN
INTRAVENOUS | Status: AC
  Filled 2015-09-03: qty 5

## 2015-09-03 MED ORDER — METOPROLOL TARTRATE 5 MG/5ML IV SOLN: 2.5000 mg | INTRAVENOUS | Status: DC | PRN

## 2015-09-03 MED ORDER — CHLORHEXIDINE GLUCONATE 0.12% ORAL RINSE (MEDLINE KIT)
15.0000 mL | Freq: Two times a day (BID) | OROMUCOSAL | Status: DC
  Administered 2015-09-03: 15 mL via OROMUCOSAL

## 2015-09-03 MED ORDER — DIPHENHYDRAMINE HCL 50 MG/ML IJ SOLN: 12.5000 mg | Freq: Four times a day (QID) | INTRAMUSCULAR | Status: DC | PRN

## 2015-09-03 MED ORDER — MELATONIN 1 MG PO CAPS: 3.0000 | ORAL_CAPSULE | Freq: Every evening | ORAL | Status: DC | PRN

## 2015-09-03 MED ORDER — SIMVASTATIN 20 MG PO TABS
20.0000 mg | ORAL_TABLET | Freq: Every day | ORAL | Status: DC
  Administered 2015-09-03 – 2015-09-05 (×3): 20 mg via ORAL
  Filled 2015-09-03: qty 2
  Filled 2015-09-03 (×2): qty 1

## 2015-09-03 MED ORDER — ACETAMINOPHEN 500 MG PO TABS
1000.0000 mg | ORAL_TABLET | ORAL | Status: AC
  Filled 2015-09-03: qty 2

## 2015-09-03 MED ORDER — MENTHOL 3 MG MT LOZG
1.0000 | LOZENGE | OROMUCOSAL | Status: DC | PRN
  Filled 2015-09-03: qty 9

## 2015-09-03 MED ORDER — SCOPOLAMINE 1 MG/3DAYS TD PT72
MEDICATED_PATCH | TRANSDERMAL | Status: AC
  Filled 2015-09-03: qty 1

## 2015-09-03 MED ORDER — DIPHENHYDRAMINE HCL 12.5 MG/5ML PO ELIX: 12.5000 mg | ORAL_SOLUTION | Freq: Four times a day (QID) | ORAL | Status: DC | PRN

## 2015-09-03 MED ORDER — BISACODYL 10 MG RE SUPP
10.0000 mg | Freq: Every day | RECTAL | Status: DC
  Administered 2015-09-04 – 2015-09-06 (×2): 10 mg via RECTAL
  Filled 2015-09-03 (×3): qty 1

## 2015-09-03 MED ORDER — GLYCOPYRROLATE 0.2 MG/ML IJ SOLN
INTRAMUSCULAR | Status: DC | PRN
  Administered 2015-09-03: 0.2 mg via INTRAVENOUS

## 2015-09-03 MED ORDER — METHOCARBAMOL 1000 MG/10ML IJ SOLN
1000.0000 mg | Freq: Four times a day (QID) | INTRAMUSCULAR | Status: DC | PRN
  Filled 2015-09-03: qty 10

## 2015-09-03 MED ORDER — ADULT MULTIVITAMIN W/MINERALS CH
1.0000 | ORAL_TABLET | Freq: Every day | ORAL | Status: DC
  Administered 2015-09-04 – 2015-09-06 (×3): 1 via ORAL
  Filled 2015-09-03 (×4): qty 1

## 2015-09-03 MED ORDER — LACTATED RINGERS IV SOLN
INTRAVENOUS | Status: DC | PRN
  Administered 2015-09-03 (×2): via INTRAVENOUS

## 2015-09-03 MED ORDER — PROPOFOL 1000 MG/100ML IV EMUL
0.0000 ug/kg/min | INTRAVENOUS | Status: DC
  Administered 2015-09-03 (×2): 50 ug/kg/min via INTRAVENOUS
  Filled 2015-09-03 (×2): qty 100

## 2015-09-03 MED ORDER — LORAZEPAM 0.5 MG PO TABS: 0.5000 mg | ORAL_TABLET | Freq: Four times a day (QID) | ORAL | Status: DC | PRN

## 2015-09-03 MED ORDER — PROPOFOL 10 MG/ML IV BOLUS
INTRAVENOUS | Status: AC
  Filled 2015-09-03: qty 40

## 2015-09-03 MED ORDER — SODIUM CHLORIDE 0.9 % IV SOLN
8.0000 mg | Freq: Four times a day (QID) | INTRAVENOUS | Status: DC | PRN
  Filled 2015-09-03: qty 4

## 2015-09-03 MED ORDER — CEFAZOLIN SODIUM-DEXTROSE 2-4 GM/100ML-% IV SOLN
INTRAVENOUS | Status: AC
  Filled 2015-09-03: qty 100

## 2015-09-03 MED ORDER — MAGIC MOUTHWASH
15.0000 mL | Freq: Four times a day (QID) | ORAL | Status: DC | PRN
  Filled 2015-09-03: qty 15

## 2015-09-03 MED ORDER — LACTATED RINGERS IV SOLN: INTRAVENOUS | Status: DC

## 2015-09-03 MED ORDER — ACETAMINOPHEN 650 MG RE SUPP: 650.0000 mg | Freq: Four times a day (QID) | RECTAL | Status: DC | PRN

## 2015-09-03 MED ORDER — METHOCARBAMOL 500 MG PO TABS
500.0000 mg | ORAL_TABLET | Freq: Four times a day (QID) | ORAL | Status: DC | PRN
  Administered 2015-09-05 – 2015-09-06 (×2): 500 mg via ORAL
  Filled 2015-09-03 (×2): qty 1

## 2015-09-03 MED ORDER — BUPIVACAINE-EPINEPHRINE 0.25% -1:200000 IJ SOLN
INTRAMUSCULAR | Status: AC
  Filled 2015-09-03: qty 1

## 2015-09-03 MED ORDER — CEFAZOLIN SODIUM-DEXTROSE 2-4 GM/100ML-% IV SOLN
2.0000 g | Freq: Three times a day (TID) | INTRAVENOUS | Status: AC
  Administered 2015-09-03 (×2): 2 g via INTRAVENOUS
  Filled 2015-09-03 (×2): qty 100

## 2015-09-03 MED ORDER — BUPIVACAINE-EPINEPHRINE 0.25% -1:200000 IJ SOLN
INTRAMUSCULAR | Status: DC | PRN
  Administered 2015-09-03: 80 mL

## 2015-09-03 MED ORDER — ANTISEPTIC ORAL RINSE SOLUTION (CORINZ): 7.0000 mL | Freq: Four times a day (QID) | OROMUCOSAL | Status: DC

## 2015-09-03 MED ORDER — HYDROMORPHONE HCL 1 MG/ML IJ SOLN: 0.2500 mg | INTRAMUSCULAR | Status: DC | PRN

## 2015-09-03 MED ORDER — FENTANYL CITRATE (PF) 100 MCG/2ML IJ SOLN: 50.0000 ug | INTRAMUSCULAR | Status: DC | PRN

## 2015-09-03 MED ORDER — LIP MEDEX EX OINT
1.0000 "application " | TOPICAL_OINTMENT | Freq: Two times a day (BID) | CUTANEOUS | Status: DC
  Administered 2015-09-03 – 2015-09-06 (×7): 1 via TOPICAL
  Filled 2015-09-03 (×3): qty 7

## 2015-09-03 MED ORDER — ONDANSETRON HCL 4 MG/2ML IJ SOLN: 4.0000 mg | Freq: Once | INTRAMUSCULAR | Status: DC

## 2015-09-03 MED ORDER — FENTANYL CITRATE (PF) 100 MCG/2ML IJ SOLN
INTRAMUSCULAR | Status: AC
Start: 2015-09-03 — End: 2015-09-03
  Administered 2015-09-03: 50 ug
  Filled 2015-09-03: qty 2

## 2015-09-03 MED ORDER — SUCCINYLCHOLINE CHLORIDE 20 MG/ML IJ SOLN
INTRAMUSCULAR | Status: DC | PRN
  Administered 2015-09-03: 100 mg via INTRAVENOUS

## 2015-09-03 MED ORDER — HYDROMORPHONE HCL 1 MG/ML IJ SOLN: 0.5000 mg | INTRAMUSCULAR | Status: DC | PRN

## 2015-09-03 MED ORDER — LACTATED RINGERS IV SOLN
INTRAVENOUS | Status: DC
  Administered 2015-09-03: 50 mL via INTRAVENOUS

## 2015-09-03 MED ORDER — DEXAMETHASONE SODIUM PHOSPHATE 10 MG/ML IJ SOLN
INTRAMUSCULAR | Status: AC
  Filled 2015-09-03: qty 1

## 2015-09-03 MED ORDER — ONDANSETRON HCL 4 MG/2ML IJ SOLN
INTRAMUSCULAR | Status: AC
  Filled 2015-09-03: qty 4

## 2015-09-03 MED ORDER — OXYCODONE HCL 5 MG PO TABS
5.0000 mg | ORAL_TABLET | ORAL | Status: DC | PRN
  Administered 2015-09-04 (×3): 5 mg via ORAL
  Administered 2015-09-05 (×2): 10 mg via ORAL
  Administered 2015-09-05 (×2): 5 mg via ORAL
  Administered 2015-09-05 – 2015-09-06 (×4): 10 mg via ORAL
  Filled 2015-09-03: qty 2
  Filled 2015-09-03 (×2): qty 1
  Filled 2015-09-03: qty 2
  Filled 2015-09-03 (×2): qty 1
  Filled 2015-09-03 (×2): qty 2
  Filled 2015-09-03: qty 1
  Filled 2015-09-03 (×2): qty 2

## 2015-09-03 MED ORDER — PANTOPRAZOLE SODIUM 40 MG PO TBEC: 40.0000 mg | DELAYED_RELEASE_TABLET | Freq: Every day | ORAL | Status: DC | PRN

## 2015-09-03 MED ORDER — ONDANSETRON HCL 4 MG/2ML IJ SOLN
4.0000 mg | Freq: Four times a day (QID) | INTRAMUSCULAR | Status: DC | PRN
  Administered 2015-09-04 – 2015-09-06 (×2): 4 mg via INTRAVENOUS

## 2015-09-03 MED ORDER — OXYCODONE HCL 5 MG PO TABS: 5.0000 mg | ORAL_TABLET | Freq: Once | ORAL | Status: DC | PRN

## 2015-09-03 MED ORDER — METRONIDAZOLE IN NACL 5-0.79 MG/ML-% IV SOLN
INTRAVENOUS | Status: AC
  Filled 2015-09-03: qty 100

## 2015-09-03 MED ORDER — CHLORHEXIDINE GLUCONATE CLOTH 2 % EX PADS: 6.0000 | MEDICATED_PAD | Freq: Once | CUTANEOUS | Status: DC

## 2015-09-03 MED ORDER — PROPOFOL 10 MG/ML IV BOLUS
INTRAVENOUS | Status: DC | PRN
  Administered 2015-09-03: 200 mg via INTRAVENOUS

## 2015-09-03 MED ORDER — ONDANSETRON 4 MG PO TBDP
4.0000 mg | ORAL_TABLET | Freq: Four times a day (QID) | ORAL | Status: DC | PRN
  Filled 2015-09-03: qty 1

## 2015-09-03 MED ORDER — PROPOFOL 10 MG/ML IV BOLUS
INTRAVENOUS | Status: AC
  Filled 2015-09-03: qty 20

## 2015-09-03 MED ORDER — CEFAZOLIN SODIUM-DEXTROSE 2-4 GM/100ML-% IV SOLN
2.0000 g | INTRAVENOUS | Status: AC
  Filled 2015-09-03: qty 100

## 2015-09-03 MED ORDER — MORPHINE SULFATE (PF) 2 MG/ML IV SOLN: 1.0000 mg | INTRAVENOUS | Status: DC | PRN

## 2015-09-03 MED ORDER — MEPERIDINE HCL 50 MG/ML IJ SOLN: 6.2500 mg | INTRAMUSCULAR | Status: DC | PRN

## 2015-09-03 MED ORDER — METOPROLOL TARTRATE 5 MG/5ML IV SOLN: 5.0000 mg | Freq: Four times a day (QID) | INTRAVENOUS | Status: DC | PRN

## 2015-09-03 MED ORDER — METOCLOPRAMIDE HCL 5 MG/ML IJ SOLN: 5.0000 mg | Freq: Four times a day (QID) | INTRAMUSCULAR | Status: DC | PRN

## 2015-09-03 MED ORDER — GABAPENTIN 300 MG PO CAPS
300.0000 mg | ORAL_CAPSULE | ORAL | Status: AC
  Filled 2015-09-03: qty 1

## 2015-09-03 MED ORDER — FENTANYL CITRATE (PF) 100 MCG/2ML IJ SOLN
50.0000 ug | Freq: Once | INTRAMUSCULAR | Status: AC
  Administered 2015-09-03: 50 ug via INTRAVENOUS

## 2015-09-03 MED ORDER — METRONIDAZOLE IN NACL 5-0.79 MG/ML-% IV SOLN
500.0000 mg | INTRAVENOUS | Status: AC
  Administered 2015-09-03: 500 mg via INTRAVENOUS

## 2015-09-03 MED ORDER — TRETINOIN 0.05 % EX CREA
1.0000 "application " | TOPICAL_CREAM | Freq: Every evening | CUTANEOUS | Status: DC | PRN
  Filled 2015-09-03: qty 20

## 2015-09-03 MED ORDER — ENOXAPARIN SODIUM 40 MG/0.4ML ~~LOC~~ SOLN
40.0000 mg | SUBCUTANEOUS | Status: DC
Start: 2015-09-04 — End: 2015-09-06
  Administered 2015-09-04 – 2015-09-06 (×3): 40 mg via SUBCUTANEOUS
  Filled 2015-09-03 (×3): qty 0.4

## 2015-09-03 MED ORDER — ONDANSETRON HCL 4 MG/2ML IJ SOLN
4.0000 mg | Freq: Four times a day (QID) | INTRAMUSCULAR | Status: DC | PRN
Start: 2015-09-03 — End: 2015-09-06
  Filled 2015-09-03 (×2): qty 2

## 2015-09-03 MED ORDER — ONDANSETRON HCL 4 MG/2ML IJ SOLN
INTRAMUSCULAR | Status: AC
  Administered 2015-09-03: 4 mg
  Filled 2015-09-03: qty 2

## 2015-09-03 MED ORDER — PROPOFOL 500 MG/50ML IV EMUL
INTRAVENOUS | Status: DC | PRN
  Administered 2015-09-03: 50 ug/kg/min via INTRAVENOUS

## 2015-09-03 MED ORDER — METHOCARBAMOL 750 MG PO TABS: 750.0000 mg | ORAL_TABLET | Freq: Four times a day (QID) | ORAL | Status: DC | PRN

## 2015-09-03 MED ORDER — FENTANYL CITRATE (PF) 250 MCG/5ML IJ SOLN
INTRAMUSCULAR | Status: AC
  Filled 2015-09-03: qty 5

## 2015-09-03 MED ORDER — ANTISEPTIC ORAL RINSE SOLUTION (CORINZ)
7.0000 mL | Freq: Four times a day (QID) | OROMUCOSAL | Status: DC
  Administered 2015-09-03 (×2): 7 mL via OROMUCOSAL

## 2015-09-03 MED ORDER — DIPHENHYDRAMINE HCL 50 MG/ML IJ SOLN
12.5000 mg | Freq: Four times a day (QID) | INTRAMUSCULAR | Status: DC | PRN
Start: 2015-09-03 — End: 2015-09-03

## 2015-09-03 MED ORDER — VALACYCLOVIR HCL 500 MG PO TABS
500.0000 mg | ORAL_TABLET | Freq: Two times a day (BID) | ORAL | Status: DC
  Administered 2015-09-03 – 2015-09-06 (×6): 500 mg via ORAL
  Filled 2015-09-03 (×7): qty 1

## 2015-09-03 MED ORDER — FAMOTIDINE IN NACL 20-0.9 MG/50ML-% IV SOLN
20.0000 mg | Freq: Two times a day (BID) | INTRAVENOUS | Status: DC
  Administered 2015-09-03 (×2): 20 mg via INTRAVENOUS
  Filled 2015-09-03 (×4): qty 50

## 2015-09-03 MED ORDER — SCOPOLAMINE 1 MG/3DAYS TD PT72
MEDICATED_PATCH | TRANSDERMAL | Status: DC | PRN
  Administered 2015-09-03: 1 via TRANSDERMAL

## 2015-09-03 MED ORDER — LIDOCAINE HCL (CARDIAC) 20 MG/ML IV SOLN
INTRAVENOUS | Status: DC | PRN
  Administered 2015-09-03: 50 mg via INTRAVENOUS

## 2015-09-03 MED ORDER — OXYCODONE HCL 5 MG/5ML PO SOLN
5.0000 mg | Freq: Once | ORAL | Status: DC | PRN
  Filled 2015-09-03: qty 5

## 2015-09-03 MED ORDER — ACETAMINOPHEN 500 MG PO TABS
1000.0000 mg | ORAL_TABLET | Freq: Four times a day (QID) | ORAL | Status: DC
  Administered 2015-09-03 – 2015-09-04 (×4): 1000 mg via ORAL
  Filled 2015-09-03 (×4): qty 2

## 2015-09-03 MED ORDER — LACTATED RINGERS IV BOLUS (SEPSIS): 1000.0000 mL | Freq: Three times a day (TID) | INTRAVENOUS | Status: AC | PRN

## 2015-09-03 MED ORDER — OXYCODONE HCL 5 MG PO TABS: 5.0000 mg | ORAL_TABLET | ORAL | Status: DC | PRN

## 2015-09-03 MED ORDER — PHENOL 1.4 % MT LIQD
2.0000 | OROMUCOSAL | Status: DC | PRN
  Filled 2015-09-03: qty 177

## 2015-09-03 MED ORDER — DEXAMETHASONE SODIUM PHOSPHATE 10 MG/ML IJ SOLN
INTRAMUSCULAR | Status: DC | PRN
  Administered 2015-09-03: 10 mg via INTRAVENOUS

## 2015-09-03 MED ORDER — FENTANYL CITRATE (PF) 100 MCG/2ML IJ SOLN
INTRAMUSCULAR | Status: DC | PRN
  Administered 2015-09-03: 100 ug via INTRAVENOUS
  Administered 2015-09-03 (×3): 50 ug via INTRAVENOUS

## 2015-09-03 MED ORDER — BUPIVACAINE LIPOSOME 1.3 % IJ SUSP
20.0000 mL | INTRAMUSCULAR | Status: AC
  Filled 2015-09-03: qty 20

## 2015-09-03 MED ORDER — BUPIVACAINE LIPOSOME 1.3 % IJ SUSP
INTRAMUSCULAR | Status: DC | PRN
  Administered 2015-09-03: 20 mL

## 2015-09-03 MED ORDER — BUPROPION HCL ER (XL) 300 MG PO TB24
300.0000 mg | ORAL_TABLET | ORAL | Status: DC
  Administered 2015-09-04 – 2015-09-06 (×3): 300 mg via ORAL
  Filled 2015-09-03 (×5): qty 1

## 2015-09-03 MED ORDER — GLYCOPYRROLATE 0.2 MG/ML IJ SOLN
INTRAMUSCULAR | Status: AC
  Filled 2015-09-03: qty 1

## 2015-09-03 MED ORDER — SIMETHICONE 80 MG PO CHEW: 40.0000 mg | CHEWABLE_TABLET | Freq: Four times a day (QID) | ORAL | Status: DC | PRN

## 2015-09-03 MED ORDER — ROCURONIUM BROMIDE 100 MG/10ML IV SOLN
INTRAVENOUS | Status: AC
  Filled 2015-09-03: qty 1

## 2015-09-03 SURGICAL SUPPLY — 41 items
APPLIER CLIP 5 13 M/L LIGAMAX5 (MISCELLANEOUS)
BENZOIN TINCTURE PRP APPL 2/3 (GAUZE/BANDAGES/DRESSINGS) ×2 IMPLANT
BINDER ABDOMINAL 12 ML 46-62 (SOFTGOODS) ×2 IMPLANT
CABLE HIGH FREQUENCY MONO STRZ (ELECTRODE) ×2 IMPLANT
CHLORAPREP W/TINT 26ML (MISCELLANEOUS) ×2 IMPLANT
CLIP APPLIE 5 13 M/L LIGAMAX5 (MISCELLANEOUS) IMPLANT
COVER SURGICAL LIGHT HANDLE (MISCELLANEOUS) ×2 IMPLANT
DECANTER SPIKE VIAL GLASS SM (MISCELLANEOUS) ×2 IMPLANT
DEVICE SECURE STRAP 25 ABSORB (INSTRUMENTS) ×2 IMPLANT
DEVICE TROCAR PUNCTURE CLOSURE (ENDOMECHANICALS) ×2 IMPLANT
DRAPE LAPAROSCOPIC ABDOMINAL (DRAPES) IMPLANT
DRAPE WARM FLUID 44X44 (DRAPE) ×2 IMPLANT
DRSG TEGADERM 2-3/8X2-3/4 SM (GAUZE/BANDAGES/DRESSINGS) ×6 IMPLANT
DRSG TEGADERM 4X4.75 (GAUZE/BANDAGES/DRESSINGS) ×2 IMPLANT
ELECT REM PT RETURN 9FT ADLT (ELECTROSURGICAL) ×2
ELECTRODE REM PT RTRN 9FT ADLT (ELECTROSURGICAL) ×1 IMPLANT
GAUZE SPONGE 2X2 8PLY STRL LF (GAUZE/BANDAGES/DRESSINGS) ×1 IMPLANT
GLOVE ECLIPSE 8.0 STRL XLNG CF (GLOVE) ×2 IMPLANT
GLOVE INDICATOR 8.0 STRL GRN (GLOVE) ×2 IMPLANT
GOWN STRL REUS W/TWL XL LVL3 (GOWN DISPOSABLE) ×4 IMPLANT
KIT BASIN OR (CUSTOM PROCEDURE TRAY) ×2 IMPLANT
MARKER SKIN DUAL TIP RULER LAB (MISCELLANEOUS) ×2 IMPLANT
MESH VENTRALIGHT ST 6X8 (Mesh Specialty) ×1 IMPLANT
MESH VENTRLGHT ELLIPSE 8X6XMFL (Mesh Specialty) ×1 IMPLANT
NEEDLE SPNL 22GX3.5 QUINCKE BK (NEEDLE) IMPLANT
PAD POSITIONING PINK XL (MISCELLANEOUS) ×2 IMPLANT
SCISSORS LAP 5X35 DISP (ENDOMECHANICALS) ×2 IMPLANT
SET IRRIG TUBING LAPAROSCOPIC (IRRIGATION / IRRIGATOR) IMPLANT
SHEARS HARMONIC ACE PLUS 36CM (ENDOMECHANICALS) IMPLANT
SLEEVE XCEL OPT CAN 5 100 (ENDOMECHANICALS) ×4 IMPLANT
SPONGE GAUZE 2X2 STER 10/PKG (GAUZE/BANDAGES/DRESSINGS) ×1
STRIP CLOSURE SKIN 1/2X4 (GAUZE/BANDAGES/DRESSINGS) ×2 IMPLANT
SUT MNCRL AB 4-0 PS2 18 (SUTURE) ×2 IMPLANT
SUT PDS AB 1 CT1 27 (SUTURE) ×8 IMPLANT
SUT PROLENE 1 CT 1 30 (SUTURE) ×12 IMPLANT
SUT VIC AB 3-0 SH 18 (SUTURE) ×2 IMPLANT
TOWEL OR 17X26 10 PK STRL BLUE (TOWEL DISPOSABLE) ×2 IMPLANT
TRAY LAPAROSCOPIC (CUSTOM PROCEDURE TRAY) ×2 IMPLANT
TROCAR BLADELESS OPT 5 100 (ENDOMECHANICALS) ×2 IMPLANT
TROCAR XCEL NON-BLD 11X100MML (ENDOMECHANICALS) IMPLANT
TUBING INSUF HEATED (TUBING) ×2 IMPLANT

## 2015-09-03 NOTE — Progress Notes (Signed)
Name: Kelly Maxwell MRN: [id number] DOB: Sep 24, 1945    ADMISSION DATE:  09/02/2015 CONSULTATION DATE:  09/03/2015  REFERRING MD :  Johney Maine, [facility]  CHIEF COMPLAINT:  Assist with postop mechanical ventilation  HISTORY OF PRESENT ILLNESS:  70 year old presented with abdominal pain and constipation, CT abdomen showed strangulated hernia and distal descending colon with a large hiatal hernia. She was noted to have this hernia back in 2013 but had held off surgery She was taken emergently for laparotomy with reduction and repair of hernia with mesh, but seemed to have prolonged recovery from neuromuscular blockade and hence was transferred to the ICU intubated.  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  has past surgical history that includes Total knee arthroplasty (2000); Bunionectomy (2001); Craniotomy (12/17/2011); Dilation and curettage of uterus (2007); and Cataract extraction (Left). Prior to Admission medications   Medication Sig Start Date End Date Taking? Authorizing Provider  buPROPion (WELLBUTRIN XL) 300 MG 24 hr tablet Take 1 tablet (300 mg total) by mouth every morning. 12/14/11  Yes Consuela Mimes, MD  calcium-vitamin D (CALCIUM + D) 250-125 MG-UNIT per tablet Take 1 tablet by mouth 2 (two) times daily. Patient taking differently: Take 1 tablet by mouth daily.  05/26/13  Yes Consuela Mimes, MD  clonazePAM (KLONOPIN) 0.5 MG tablet Take 0.5 mg by  mouth at bedtime as needed for anxiety.   Yes Historical Provider, MD  LORazepam (ATIVAN) 1 MG tablet Take 1 mg by mouth every 6 (six) hours as needed for anxiety.   Yes Historical Provider, MD  Melatonin 1 MG CAPS Take 3 capsules by mouth at bedtime as needed (sleep).    Yes Historical Provider, MD  Multiple Vitamin (MULTIVITAMIN) tablet Take 1 tablet by mouth daily.   Yes Historical Provider, MD  pantoprazole (PROTONIX) 40 MG tablet Take 40 mg by mouth daily as needed (heartburn/ acid reflux).    Yes Historical Provider, MD  simvastatin (ZOCOR) 40 MG tablet Take 20 mg by mouth daily at 6 PM.    Yes Historical Provider, MD  tretinoin (RETIN-A) 0.05 % cream Apply 1 application topically at bedtime as needed. spots 06/24/15  Yes Historical Provider, MD  valACYclovir (VALTREX) 500 MG tablet Take 500 mg by mouth 2 (two) times daily. 08/17/15  Yes Historical Provider, MD  Bromfenac Sodium (PROLENSA) 0.07 % SOLN Place 1 drop into the right eye 3 (three) times daily. Reported on 09/02/2015    Historical Provider, MD  Difluprednate (DUREZOL OP) Place 1 drop into the right eye 2 (two) times daily. Reported on 09/02/2015    Historical Provider, MD  methocarbamol (ROBAXIN) 750 MG tablet Take 1 tablet (750 mg total) by mouth 4 (four) times daily as needed (use for muscle cramps/pain). 09/03/15   [name], MD  oxyCODONE (OXY IR/ROXICODONE) 5 MG immediate release tablet Take 1-2 tablets (5-10 mg total) by mouth every 4 (four) hours as needed for moderate pain, severe pain or breakthrough pain. 09/03/15   [name], MD   Allergies  Allergen Reactions  .  Codeine Nausea And Vomiting and Nausea Only  . 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    . Compazine [Prochlorperazine Edisylate] Other (See Comments) and Rash    Became hyper Became hyper    FAMILY HISTORY:  family history includes Heart disease in her mother. SOCIAL HISTORY:   reports that she has never smoked. She has never used smokeless tobacco. She reports that she does not use illicit drugs.  REVIEW OF SYSTEMS:    Unable to obtain since just extubated and has hoarse voice Denies pain or dyspnea  SUBJECTIVE:   VITAL SIGNS: Temp:  [97.5 F (36.4 C)-98.5 F (36.9 C)] 97.7 F (36.5 C) (07/01 0800) Pulse Rate:  [59-81] 68 (07/01 1100) Resp:  [15-20] 15 (07/01 1100) BP: (114-162)/(42-88) 145/77 mmHg (07/01 1100) SpO2:  [94 %-100 %] 96 % (07/01 1100) FiO2 (%):  [30 %] 30 % (07/01 0900) Weight:  [180 lb (81.647 kg)] 180 lb (81.647 kg) (06/30 1917)  PHYSICAL EXAMINATION: Gen. Pleasant, well-nourished, in no distress, normal affect ENT - no lesions, no post nasal drip Neck: No JVD, no thyromegaly, no carotid bruits Lungs: no use of accessory muscles, no dullness to percussion, clear without rales or rhonchi  Cardiovascular: Rhythm regular, heart sounds  normal, no murmurs, no peripheral edema Abdomen: soft and non-tender, distended,no hepatosplenomegaly, BS normal. Musculoskeletal: No deformities, no cyanosis or clubbing Neuro:  alert, non focal Skin:  Warm, no lesions/ rash    Recent Labs Lab 09/02/15 2201  NA 138  K 3.9  CL 111  CO2 22  BUN 15  CREATININE 0.57  GLUCOSE 94    Recent Labs Lab 09/02/15 2201  HGB 14.4  HCT 43.4  WBC 9.0  PLT 256   Ct Abdomen Pelvis W Contrast  09/03/2015  CLINICAL DATA:  Left abdominal pain.  Nausea. EXAM: CT ABDOMEN AND PELVIS WITH CONTRAST TECHNIQUE: Multidetector CT imaging of the abdomen and pelvis was performed using the standard protocol following bolus administration of intravenous contrast. CONTRAST:  135mL ISOVUE-300 IOPAMIDOL (ISOVUE-300) INJECTION 61% COMPARISON:  12/14/2011 FINDINGS: Lower chest: Incompletely imaged mild patchy airspace opacity in the bases, right greater than left, possibly atelectatic. Hepatobiliary: There are normal appearances of the liver, gallbladder and bile ducts.  Pancreas: Normal Spleen: Normal Adrenals/Urinary Tract: The adrenals and kidneys are normal in appearance. There is no urinary calculus evident. There is no hydronephrosis or ureteral dilatation. Collecting systems and ureters appear unremarkable. Stomach/Bowel: There is a large hiatal hernia. Remainder of the stomach is unremarkable. Small bowel is normal. There is a ventral hernia at the lateral margin of the left rectus abdominus muscle at the level of the iliac crest. This contains a loop of distal descending colon. The neck of the hernia appears to measure about 2.5 cm, but the fat within the hernia is inflamed, and the herniated portion of colon exhibits moderate mural edema. This suggests a component of strangulation within the hernia. No frank obstructive changes. No pneumatosis or extraluminal air. Colon is otherwise remarkable only for moderate diverticulosis. Vascular/Lymphatic: The abdominal aorta is normal in caliber. There is mild atherosclerotic calcification. There is no adenopathy in the abdomen or pelvis. Reproductive: Uterus and adnexal regions are unremarkable. Other: No ascites. Musculoskeletal: No significant skeletal lesion. There is a grade 1 spondylolisthesis at L4-5, and moderate lower lumbar facet degeneration. IMPRESSION: 1. 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 There is mild mural edema and inflammatory fat stranding within  the hernia suggesting a component of strangulation. No frank obstruction or perforation. 2. Large hiatal hernia. 3. Diverticulosis. 4. Mild atelectatic appearing lung base opacities, right greater than left. Electronically Signed   By: Andreas Newport M.D.   On: 09/03/2015 00:28   Portable Chest Xray  09/03/2015  CLINICAL DATA:  Respiratory failure EXAM: PORTABLE CHEST 1 VIEW COMPARISON:  06/22/2014 FINDINGS: Endotracheal tube is 4.2 cm above the carina. Unchanged right hemidiaphragm elevation. Slight atelectatic appearing  linear lung base opacities. No confluent consolidation. No large effusion. No pneumothorax. Normal pulmonary vasculature IMPRESSION: Satisfactory ET tube position.  No consolidation or large effusion. Electronically Signed   By: Andreas Newport M.D.   On: 09/03/2015 05:53    ASSESSMENT / PLAN:  Postop respiratory failure-due to prolonged recovery from neuromuscular blockade -She self extubated and appears to be in no respiratory distress and is able to maintain airway -can use morphine as needed for pain -Can use Ativan as needed for anxiety  Postop care per surgery  [facility] M available as needed  Kara Mead MD. FCCP.  Pulmonary & Critical care Pager [phone] If no response call [phone]     09/03/2015, 11:26 AM

## 2015-09-03 NOTE — Progress Notes (Signed)
[facility] Progress Note Patient Name: Kelly Maxwell DOB: 12/04/1945 MRN: [id number]   Date of Service  09/03/2015  HPI/Events of Note  Camera check > pt looks comfortable. Asleep, not in distress.  113/48, 60, 20, 95% on 2L  eICU Interventions  Cont to observe.         [place] 09/03/2015, 9:28 PM

## 2015-09-03 NOTE — Transfer of Care (Signed)
Immediate Anesthesia Transfer of Care Note  Patient: Kelly Maxwell  Procedure(s) Performed: Procedure(s): LAPAROSCOPIC repair of incarcerated spigelian hernia with mesh, reduction and repair  (N/A)  Patient Location: ICU  Anesthesia Type:General  Level of Consciousness: unresponsive and Patient remains intubated per anesthesia plan  Airway & Oxygen Therapy: Patient remains intubated per anesthesia plan and Patient placed on Ventilator (see vital sign flow sheet for setting)  Post-op Assessment: Report given to RN and Post -op Vital signs reviewed and stable  Post vital signs: Reviewed and stable  Last Vitals:  Filed Vitals:   09/02/15 1917 09/02/15 2238  BP: 146/88 162/68  Pulse: 76 61  Temp: 36.9 C   Resp: 20 18    Last Pain:  Filed Vitals:   09/02/15 2239  PainSc: 1          Complications: No apparent anesthesia complications Pt remained intubated due to possible Pseudocholinesterase Deficiency

## 2015-09-03 NOTE — Anesthesia Preprocedure Evaluation (Signed)
Anesthesia Evaluation  Patient identified by MRN, date of birth, ID band Patient awake    Reviewed: Allergy & Precautions, NPO status , Patient's Chart, lab work & pertinent test results  History of Anesthesia Complications (+) PONV  Airway Mallampati: I  TM Distance: >3 FB Neck ROM: Full    Dental  (+) Teeth Intact, Dental Advisory Given   Pulmonary    breath sounds clear to auscultation       Cardiovascular  Rhythm:Regular Rate:Normal     Neuro/Psych    GI/Hepatic hiatal hernia, GERD  Medicated and Controlled,  Endo/Other    Renal/GU      Musculoskeletal   Abdominal   Peds  Hematology   Anesthesia Other Findings   Reproductive/Obstetrics                             Anesthesia Physical Anesthesia Plan  ASA: III  Anesthesia Plan: General   Post-op Pain Management:    Induction: Intravenous, Rapid sequence and Cricoid pressure planned  Airway Management Planned: Oral ETT  Additional Equipment:   Intra-op Plan:   Post-operative Plan: Extubation in OR  Informed Consent: I have reviewed the patients History and Physical, chart, labs and discussed the procedure including the risks, benefits and alternatives for the proposed anesthesia with the patient or authorized representative who has indicated his/her understanding and acceptance.   Dental advisory given  Plan Discussed with: CRNA, Anesthesiologist and Surgeon  Anesthesia Plan Comments:         Anesthesia Quick Evaluation

## 2015-09-03 NOTE — Discharge Instructions (Signed)
[facility] REPAIR: POST OP INSTRUCTIONS ° °###################################################################### ° °EAT °Gradually transition to a high fiber diet with a fiber supplement over the next few weeks after discharge.  Start with a pureed / full liquid diet (see below) ° °WALK °Walk an hour a day.  Control your pain to do that.   ° °CONTROL PAIN °Control pain so that you can walk, sleep, tolerate sneezing/coughing, go up/down stairs. ° °HAVE A BOWEL MOVEMENT DAILY °Keep your bowels regular to avoid problems.  OK to try a laxative to override constipation.  OK to use an antidairrheal to slow down diarrhea.  Call if not better after 2 tries ° °CALL IF YOU HAVE PROBLEMS/CONCERNS °Call if you are still struggling despite following these instructions. °Call if you have concerns not answered by these instructions ° °###################################################################### ° ° ° °1. DIET: Follow a light bland diet the first 24 hours after arrival home, such as soup, liquids, crackers, etc.  Be sure to include lots of fluids daily.  Avoid fast food or heavy meals as your are more likely to get nauseated.  Eat a low fat the next few days after surgery. °2. Take your usually prescribed home medications unless otherwise directed. °3. PAIN CONTROL: °a. Pain is best controlled by a usual combination of three different methods TOGETHER: °i. Ice/Heat °ii. Over the counter pain medication °iii. Prescription pain medication °b. Most patients will experience some swelling and bruising around the hernia(s) such as the bellybutton, groins, or old incisions.  Ice packs or heating pads (30-60 minutes up to 6 times a day) will help. Use ice for the first few days to help decrease swelling and bruising, then switch to heat to help relax tight/sore spots and speed recovery.  Some people prefer to use ice alone, heat alone, alternating between ice & heat.  Experiment to what works for you.  Swelling and bruising can take  several weeks to resolve.   °c. It is helpful to take an over-the-counter pain medication regularly for the first few weeks.  Choose one of the following that works best for you: °i. Naproxen (Aleve, etc)  Two 220mg tabs twice a day °ii. Ibuprofen (Advil, etc) Three 200mg tabs four times a day (every meal & bedtime) °iii. Acetaminophen (Tylenol, etc) 325-650mg four times a day (every meal & bedtime) °d. A  prescription for pain medication should be given to you upon discharge.  Take your pain medication as prescribed.  °i. If you are having problems/concerns with the prescription medicine (does not control pain, nausea, vomiting, rash, itching, etc), please call us [phone] to see if we need to switch you to a different pain medicine that will work better for you and/or control your side effect better. °ii. If you need a refill on your pain medication, please contact your pharmacy.  They will contact our office to request authorization. Prescriptions will not be filled after 5 pm or on week-ends. °4. Avoid getting constipated.  Between the surgery and the pain medications, it is common to experience some constipation.  Increasing fluid intake and taking a fiber supplement (such as Metamucil, Citrucel, FiberCon, MiraLax, etc) 1-2 times a day regularly will usually help prevent this problem from occurring.  A mild laxative (prune juice, Milk of Magnesia, MiraLax, etc) should be taken according to package directions if there are no bowel movements after 48 hours.   °5. Wash / shower every day.  You may shower over the dressings as they are waterproof.   °6. Remove   your waterproof bandages 5 days after surgery.  You may leave the incision open to air.  You may replace a dressing/Band-Aid to cover the incision for comfort if you wish.  Continue to shower over incision(s) after the dressing is off. ° ° ° °7. ACTIVITIES as tolerated:   °a. You may resume regular (light) daily activities beginning the next day--such  as daily self-care, walking, climbing stairs--gradually increasing activities as tolerated.  If you can walk 30 minutes without difficulty, it is safe to try more intense activity such as jogging, treadmill, bicycling, low-impact aerobics, swimming, etc. °b. Save the most intensive and strenuous activity for last such as sit-ups, heavy lifting, contact sports, etc  Refrain from any heavy lifting or straining until you are off narcotics for pain control.   °c. DO NOT PUSH THROUGH PAIN.  Let pain be your guide: If it hurts to do something, don't do it.  Pain is your body warning you to avoid that activity for another week until the pain goes down. °d. You may drive when you are no longer taking prescription pain medication, you can comfortably wear a seatbelt, and you can safely maneuver your car and apply brakes. °e. You may have sexual intercourse when it is comfortable.  °8. FOLLOW UP in our office °a. Please call [facility] at [phone] to set up an appointment to see your surgeon in the office for a follow-up appointment approximately 2-3 weeks after your surgery. °b. Make sure that you call for this appointment the day you arrive home to insure a convenient appointment time. °9.  IF YOU HAVE DISABILITY OR FAMILY LEAVE FORMS, BRING THEM TO THE OFFICE FOR PROCESSING.  DO NOT GIVE THEM TO YOUR DOCTOR. ° °WHEN TO CALL US [phone]: °1. Poor pain control °2. Reactions / problems with new medications (rash/itching, nausea, etc)  °3. Fever over 101.5 F (38.5 C) °4. Inability to urinate °5. Nausea and/or vomiting °6. Worsening swelling or bruising °7. Continued bleeding from incision. °8. Increased pain, redness, or drainage from the incision ° ° The clinic staff is available to answer your questions during regular business hours (8:30am-5pm).  Please don’t hesitate to call and ask to speak to one of our nurses for clinical concerns.  ° If you have a medical emergency, go to the nearest emergency room or call  911. ° A surgeon from [facility] is always on call at the hospitals in [place] ° °[facility] °[address], [address], [place], [place]  [address] ? ° [address] [address], [place],    [address] °MAIN: [phone] ? TOLL FREE: [phone] ? FAX: [phone] °[email or web address] ° ° °

## 2015-09-03 NOTE — Progress Notes (Signed)
PHARMACIST - PHYSICIAN ORDER COMMUNICATION  CONCERNING: P&T Medication Policy on Herbal Medications  DESCRIPTION:  This patient's order for:  Melatonin  has been noted.  This product(s) is classified as an "herbal" or natural product. Due to a lack of definitive safety studies or FDA approval, nonstandard manufacturing practices, plus the potential risk of unknown drug-drug interactions while on inpatient medications, the Pharmacy and Therapeutics Committee does not permit the use of "herbal" or natural products of this type within [place].   ACTION TAKEN: The pharmacy department is unable to verify this order at this time and your patient has been informed of this safety policy. Please reevaluate patient's clinical condition at discharge and address if the herbal or natural product(s) should be resumed at that time.  Lateia Fraser, PharmD  

## 2015-09-03 NOTE — Consult Note (Addendum)
[name]  [address] [name], [name] [address] Phone: [phone] FAX: [place]  1945-07-10 [id number]  CARE TEAM:  PCP: Wenda Low, MD  Outpatient Care Team: Patient Care Team: Wenda Low, MD as PCP - General (Internal Medicine) [name], MD as Consulting Physician (Radiation Oncology) [name], MD as Medical Oncologist (Medical Oncology)  Inpatient Treatment Team: Treatment Team: Attending Provider: [name], MD; Registered Nurse: Moss Mc, RN; Technician: Marlene Bast, NT; Consulting Physician: Nolon Nations, MD  This patient is a 70 y.o.female who presents today for surgical evaluation at the request of Dr Jac Canavan.   Reason for evaluation: Incarcerated Spigelian hernia  Pleasant woman.  History of brain tumor status post radiosurgery with no evidence of recurrence, follwed at [facility].  Was noted to have an abdominal wall Spigelian hernia on a CT scan in 2013.  Had some discomfort with it.  Surgical consultation made.  Surgery recommended in 2015 by my partner, Dr. Zella Richer.  Patient held off.  Patient's not had much problems there for the past six months.  However for at least five days she has not had a bowel movement.  Decreased flatus.  Some fullness.  Some abdominal discomfort as well.  Came to the emergency room tonight, Friday night.  In left lower quadrant of abdomen.  CT scan confirmed confirms larger spaghetti and hernia now with a knuckle of colon within it.  Some inflammation but no definite perforation or gangrene.  Because of the incarcerated hernia does not seem to be reducible, emergent surgical consultation requested.  Patient is here with her husband at the bedside.  She has been able to tolerate liquids.  Some nausea but no vomiting.  Feels sore in the left lower side.  Tried oral laxatives & a few enemas no bowel movements.  A little bit of flatus.  She has never had  abdominal surgeries.  She can walk 20 minutes without difficulty.  She does not smoke.  No history of stroke or heart attack.   Past Medical History  Diagnosis Date  . PONV (postoperative nausea and vomiting)   . Depression   . GERD (gastroesophageal reflux disease)   . Headache(784.0)   . HLD (hyperlipidemia)   . 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 Hiatal hernia   . History of radiation therapy 01/07/2012-02/18/12    left temporal  58GY  . Cancer [facility])     Past Surgical History  Procedure Laterality Date  . Total knee arthroplasty  2000    left  . Bunionectomy  2001    right  . Craniotomy  12/17/2011    Procedure: CRANIOTOMY TUMOR EXCISION;  Surgeon: Otilio Connors, MD;  Location: [facility];  Service: Neurosurgery;  Laterality: Left;  LEFT temporal craniotomy with stealth for tumor resection  . Dilation and curettage of uterus  2007  . Cataract extraction Left     Social History   Social History  . Marital Status: Married    Spouse Name: Kelly Maxwell  . Number of Children: 3  . Years of Education: Kelly Maxwell   Occupational History  . Not on file.   Social History Main Topics  . Smoking status: Never Smoker   . Smokeless tobacco: Never Used  .  Alcohol Use: Not on file  . Drug Use: No  . Sexual Activity: Yes   Other Topics Concern  . Not on file   Social History Narrative   2nd marriage of 6 years, previously widowed    Family History  Problem Relation Age of Onset  . Heart disease Mother     Current Facility-Administered Medications  Medication Dose Route Frequency Provider Last Rate Last Dose  . ondansetron (ZOFRAN) injection 4 mg  4 mg Intravenous Once [name], MD   4 mg at 09/03/15 0109   Current Outpatient Prescriptions  Medication Sig Dispense Refill  . buPROPion (WELLBUTRIN XL)  300 MG 24 hr tablet Take 1 tablet (300 mg total) by mouth every morning.    . calcium-vitamin D (CALCIUM + D) 250-125 MG-UNIT per tablet Take 1 tablet by mouth 2 (two) times daily. (Patient taking differently: Take 1 tablet by mouth daily. )    . clonazePAM (KLONOPIN) 0.5 MG tablet Take 0.5 mg by mouth at bedtime as needed for anxiety.    [name] (ATIVAN) 1 MG tablet Take 1 mg by mouth every 6 (six) hours as needed for anxiety.    . Melatonin 1 MG CAPS Take 3 capsules by mouth at bedtime as needed (sleep).     . Multiple Vitamin (MULTIVITAMIN) tablet Take 1 tablet by mouth daily.    . pantoprazole (PROTONIX) 40 MG tablet Take 40 mg by mouth daily as needed (heartburn/ acid reflux).     . simvastatin (ZOCOR) 40 MG tablet Take 20 mg by mouth daily at 6 PM.     . tretinoin (RETIN-A) 0.05 % cream Apply 1 application topically at bedtime as needed. spots  0  . valACYclovir (VALTREX) 500 MG tablet Take 500 mg by mouth 2 (two) times daily.  1  . Bromfenac Sodium (PROLENSA) 0.07 % SOLN Place 1 drop into the right eye 3 (three) times daily. Reported on 09/02/2015    . Difluprednate (DUREZOL OP) Place 1 drop into the right eye 2 (two) times daily. Reported on 09/02/2015       Allergies  Allergen Reactions  . Codeine Nausea And Vomiting and Nausea Only  . Compazine [Prochlorperazine Edisylate] Other (See Comments) and Rash    Became hyper Became hyper    ROS: Constitutional:  No fevers, chills, sweats.  Weight stable Eyes:  No vision changes, No discharge HENT:  No sore throats, nasal drainage Lymph: No neck swelling, No bruising easily Pulmonary:  No cough, productive sputum CV: No orthopnea, PND  Patient walks 20 minutes for about 1 miles without difficulty.  No exertional chest/neck/shoulder/arm pain. GI:  No personal nor family history of GI/colon cancer, inflammatory bowel disease, irritable bowel syndrome, allergy such as Celiac Sprue, dietary/dairy problems, colitis, ulcers nor gastritis.   No recent sick contacts/gastroenteritis.  No travel outside the country.  No changes in diet. Renal: No UTIs, No hematuria Genital:  No drainage, bleeding, masses Musculoskeletal: No severe joint pain.  Good ROM major joints Skin:  No sores or lesions.  No rashes Heme/Lymph:  No easy bleeding.  No swollen lymph nodes Neuro: No focal weakness/numbness.  No seizures.  H/o PONV  Psych: No suicidal ideation.  No hallucinations  BP 162/68 mmHg  Pulse 61  Temp(Src) 98.5 F (36.9 C) (Oral)  Resp 18  Ht _0  (1.651 m)  Wt 81.647 kg (180 lb)  BMI 29.95 kg/m2  SpO2 97%  Physical Exam: General: Pt awake/alert/oriented x4 in no major acute distress Eyes:  PERRL, normal EOM. Sclera nonicteric.  Chronic left eye droop Neuro: CN II-XII intact w/o focal sensory/motor deficits. Lymph: No head/neck/groin lymphadenopathy Psych:  No delerium/psychosis/paranoia.  Mildly anxious but consolable. HENT: Normocephalic, Mucus membranes moist.  No thrush Neck: Supple, No tracheal deviation Chest: No pain.  Good respiratory excursion. CV:  Pulses intact.  Regular rhythm Abdomen: Obese.  Soft, Nondistended.  8 x 8 cm left lower quadrant paramedian mass.  Able to be partially reduced down to 5 x 5 cm but not completely reducible.  Sensitive.  No cellulitis or gangrene.  Rest of the abdomen is nontender.  No umbilical hernia Genital: Normal external female genitalia.  No inguinal hernias.  No lymphadenopathy. Ext:  SCDs BLE.  No significant edema.  No cyanosis Skin: No petechiae / purpurea.  No major sores Musculoskeletal: No severe joint pain.  Good ROM major joints   Results:   Labs: Results for orders placed or performed during the hospital encounter of 09/02/15 (from the past 48 hour(s))  Urinalysis, Routine w reflex microscopic     Status: Abnormal   Collection Time: 09/02/15  9:09 PM  Result Value Ref Range   Color, Urine YELLOW YELLOW   APPearance CLEAR CLEAR   Specific Gravity, Urine 1.018 1.005  - 1.030   pH 6.0 5.0 - 8.0   Glucose, UA NEGATIVE NEGATIVE mg/dL   Hgb urine dipstick TRACE (A) NEGATIVE   Bilirubin Urine NEGATIVE NEGATIVE   Ketones, ur NEGATIVE NEGATIVE mg/dL   Protein, ur NEGATIVE NEGATIVE mg/dL   Nitrite NEGATIVE NEGATIVE   Leukocytes, UA NEGATIVE NEGATIVE  Urine microscopic-add on     Status: Abnormal   Collection Time: 09/02/15  9:09 PM  Result Value Ref Range   Squamous Epithelial / LPF 0-5 (A) NONE SEEN   WBC, UA 0-5 0 - 5 WBC/hpf   RBC / HPF 0-5 0 - 5 RBC/hpf   Bacteria, UA RARE (A) NONE SEEN   Crystals CA OXALATE CRYSTALS (A) NEGATIVE   Urine-Other MUCOUS PRESENT   Comprehensive metabolic panel     Status: None   Collection Time: 09/02/15 10:01 PM  Result Value Ref Range   Sodium 138 135 - 145 mmol/L   Potassium 3.9 3.5 - 5.1 mmol/L   Chloride 111 101 - 111 mmol/L   CO2 22 22 - 32 mmol/L   Glucose, Bld 94 65 - 99 mg/dL   BUN 15 6 - 20 mg/dL   Creatinine, Ser 0.57 0.44 - 1.00 mg/dL   Calcium 9.1 8.9 - 10.3 mg/dL   Total Protein 6.7 6.5 - 8.1 g/dL   Albumin 4.1 3.5 - 5.0 g/dL   AST 18 15 - 41 U/L   ALT 24 14 - 54 U/L   Alkaline Phosphatase 89 38 - 126 U/L   Total Bilirubin 1.1 0.3 - 1.2 mg/dL   GFR calc non Af Amer >60 >60 mL/min   GFR calc Af Amer >60 >60 mL/min    Comment: (NOTE) The eGFR has been calculated using the CKD EPI equation. This calculation has not been validated in all clinical situations. eGFR's persistently <60 mL/min signify possible Chronic Kidney Disease.    Anion gap 5 5 - 15  CBC with Differential     Status: None   Collection Time: 09/02/15 10:01 PM  Result Value Ref Range   WBC 9.0 4.0 - 10.5 K/uL   RBC 4.82 3.87 - 5.11 MIL/uL   Hemoglobin 14.4 12.0 - 15.0 g/dL   HCT 43.4 36.0 - 46.0 %  MCV 90.0 78.0 - 100.0 fL   MCH 29.9 26.0 - 34.0 pg   MCHC 33.2 30.0 - 36.0 g/dL   RDW 14.6 11.5 - 15.5 %   Platelets 256 150 - 400 K/uL   Neutrophils Relative % 79 %   Neutro Abs 7.1 1.7 - 7.7 K/uL   Lymphocytes Relative 14 %    Lymphs Abs 1.3 0.7 - 4.0 K/uL   Monocytes Relative 5 %   Monocytes Absolute 0.5 0.1 - 1.0 K/uL   Eosinophils Relative 2 %   Eosinophils Absolute 0.1 0.0 - 0.7 K/uL   Basophils Relative 0 %   Basophils Absolute 0.0 0.0 - 0.1 K/uL  Lipase, blood     Status: None   Collection Time: 09/02/15 10:01 PM  Result Value Ref Range   Lipase 31 11 - 51 U/L    Imaging / Studies: Ct Abdomen Pelvis W Contrast  09/03/2015  CLINICAL DATA:  Left abdominal pain.  Nausea. EXAM: CT ABDOMEN AND PELVIS WITH CONTRAST TECHNIQUE: Multidetector CT imaging of the abdomen and pelvis was performed using the standard protocol following bolus administration of intravenous contrast. CONTRAST:  157m ISOVUE-300 IOPAMIDOL (ISOVUE-300) INJECTION 61% COMPARISON:  12/14/2011 FINDINGS: Lower chest: Incompletely imaged mild patchy airspace opacity in the bases, right greater than left, possibly atelectatic. Hepatobiliary: There are normal appearances of the liver, gallbladder and bile ducts. Pancreas: Normal Spleen: Normal Adrenals/Urinary Tract: The adrenals and kidneys are normal in appearance. There is no urinary calculus evident. There is no hydronephrosis or ureteral dilatation. Collecting systems and ureters appear unremarkable. Stomach/Bowel: There is a large hiatal hernia. Remainder of the stomach is unremarkable. Small bowel is normal. There is a ventral hernia at the lateral margin of the left rectus abdominus muscle at the level of the iliac crest. This contains a loop of distal descending colon. The neck of the hernia appears to measure about 2.5 cm, but the fat within the hernia is inflamed, and the herniated portion of colon exhibits moderate mural edema. This suggests a component of strangulation within the hernia. No frank obstructive changes. No pneumatosis or extraluminal air. Colon is otherwise remarkable only for moderate diverticulosis. Vascular/Lymphatic: The abdominal aorta is normal in caliber. There is mild  atherosclerotic calcification. There is no adenopathy in the abdomen or pelvis. Reproductive: Uterus and adnexal regions are unremarkable. Other: No ascites. Musculoskeletal: No significant skeletal lesion. There is a grade 1 spondylolisthesis at L4-5, and moderate lower lumbar facet degeneration. IMPRESSION: 1. 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 There is mild mural edema and inflammatory fat stranding within the hernia suggesting a component of strangulation. No frank obstruction or perforation. 2. Large hiatal hernia. 3. Diverticulosis. 4. Mild atelectatic appearing lung base opacities, right greater than left. Electronically Signed   By: DAndreas NewportM.D.   On: 09/03/2015 00:28    Medications / Allergies: per chart  Antibiotics: Anti-infectives    None      Assessment  Kelly Maxwell 70y.o. 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  Start with diagnostic laparoscopy.  Possible mesh repair was not infected.  Otherwise primary repair.  May require bowel resection.  Hopefully not too  likely.  Reasoning and technique discussed with patient and her husband.  They agree to proceed.  The anatomy & physiology of the digestive tract was discussed.  The pathophysiology of perforation was discussed.  Differential diagnosis such as perforated ulcer or colon, etc was discussed.   Natural history risks without surgery such as death was discussed.  I recommended abdominal exploration to diagnose & treat the source of the problem.  Laparoscopic & open techniques were discussed.   Risks such as bleeding, infection, abscess, leak, reoperation, bowel resection,  possible ostomy, injury to other organs, need for repair of tissues / organs, hernia, heart attack, death, and other risks were discussed.   The risks of no intervention will lead to serious problems including death.   I expressed a good likelihood that surgery will address the problem.    Goals of post-operative recovery were discussed as well.  We will work to minimize complications although risks in an emergent setting are high.   Questions were answered.  The patient expressed understanding & wishes to proceed with surgery.       -Patient concern with prior issues of postoperative nausea and vomiting.  We will try and make anesthesia where.  Do PONV regimen.  Most likely will give steroids given history of brain tumor anyway. -VTE prophylaxis- SCDs, etc -mobilize as tolerated to help recovery    Adin Hector, M.D., F.A.C.S. Gastrointestinal and Minimally Invasive Surgery [facility] [address] [place], [place] [address] [phone] Main / Paging   09/03/2015  Note: Portions of this report may have been transcribed using voice recognition software. Every effort was made to ensure accuracy; however, inadvertent computerized transcription errors may be present.   Any transcriptional errors that result from this process are unintentional.

## 2015-09-03 NOTE — Progress Notes (Signed)
eLink Physician-Brief Progress Note Patient Name: Kelly Maxwell DOB: [birth year] MRN: [id number]   Date of Service  09/03/2015  HPI/Events of Note  31 F s/p repair of incarcerated hernia.  On vent due to prolonged effect of paralytic.  HD stable with HR in the 60s - 80s and BP of 152/86 (108).  RR on vent set at 18.  Sats of 98%.  eICU Interventions  Plan: Vent and sedation orders F/U PCXR and ABG To be seen AM rounds by PCCM     Intervention Category Evaluation Type: New Patient Evaluation  Anddy Wingert 09/03/2015, 5:39 AM

## 2015-09-03 NOTE — Anesthesia Postprocedure Evaluation (Signed)
Anesthesia Post Note  Patient: [name]  Procedure(s) Performed: Procedure(s) (LRB): LAPAROSCOPIC repair of incarcerated spigelian hernia with mesh, reduction and repair  (N/A)  Patient location during evaluation: ICU Anesthesia Type: General Level of consciousness: sedated and patient remains intubated per anesthesia plan Pain management: pain level controlled Vital Signs Assessment: post-procedure vital signs reviewed and stable Respiratory status: patient remains intubated per anesthesia plan and patient on ventilator - see flowsheet for VS Cardiovascular status: blood pressure returned to baseline Anesthetic complications: yes Anesthetic complication details: anesthesia complicationsComments: Pt has not recovered from administration of Succinylcholine used as the muscle relaxant to facilitate intubation.  After 3 hours she had developed a very weak twitch but nothing sustained enough to warrant an attempt of spontaneous breathing.  Looking at her surgical history, she may never have had this drug and could have a pseudocholinesterase issue.  I explained the problem to her husband and he seemed to understand.  Will arrange follow up.  Dr. Johney Maine aware.    Last Vitals:  Filed Vitals:   09/03/15 0524 09/03/15 0600  BP: 158/76 149/68  Pulse: 81 59  Temp:    Resp: 18 18    Last Pain:  Filed Vitals:   09/03/15 0612  PainSc: 1                  Jonnie Kubly A

## 2015-09-03 NOTE — Anesthesia Procedure Notes (Addendum)
Procedure Name: Intubation Date/Time: 09/03/2015 3:00 AM Performed by: Anne Fu Pre-anesthesia Checklist: Patient identified, Emergency Drugs available, Suction available, Patient being monitored and Timeout performed Patient Re-evaluated:Patient Re-evaluated prior to inductionOxygen Delivery Method: Circle system utilized Preoxygenation: Pre-oxygenation with 100% oxygen Intubation Type: IV induction Ventilation: Mask ventilation without difficulty Laryngoscope Size: Mac and 4 Grade View: Grade I Tube type: Oral Tube size: 7.0 mm Number of attempts: 1 Airway Equipment and Method: Stylet Placement Confirmation: ETT inserted through vocal cords under direct vision,  positive ETCO2,  CO2 detector and breath sounds checked- equal and bilateral Secured at: 21 cm Tube secured with: Tape Dental Injury: Teeth and Oropharynx as per pre-operative assessment

## 2015-09-03 NOTE — Progress Notes (Signed)
Pt moved to ICU for post op ventilation and sedation due to prolonged recovery from neuromuscular blockade associated with Succinylcholine.  Dr. Jimmy Footman spoke with me and agreed with ICU admission and will manage her care there until full recovery and extubation can occur.  The surgery and anesthetic course was uneventful except for this problem.    I spoke with the husband of the patient and he understood the need for ICU care. I briefly explained what I think the course might be to full recovery of neuromuscular function and extubation.  He seemed to understand.  There are no anesthetic records in our system that show her ever receiving Succinylcholine and the types of other surgical procedures she has had may not have called for its use.    Dr. Oren Bracket will follow her if she is not extubated prior to my leaving as he is on call after 7 AM today.

## 2015-09-03 NOTE — Op Note (Signed)
09/03/2015  5:17 AM  PATIENT:  Kelly Maxwell  70 y.o. female  Patient Care Team: Wenda Low, MD as PCP - General (Internal Medicine) [name], MD as Consulting Physician (Radiation Oncology) [name], MD as Medical Oncologist (Medical Oncology)  PRE-OPERATIVE DIAGNOSIS:  Incarcerated Ventral Hernia  POST-OPERATIVE DIAGNOSIS:  Spigelian ventral hernia incarcerated with colon  PROCEDURE:  Procedure(s): LAPAROSCOPIC reduction & repair of incarcerated spigelian hernia with mesh  SURGEON:  Surgeon(s): [name], MD  ASSISTANT: RN   ANESTHESIA:     General Local anesthesia field block: (0.25% bupivacaine & liposomal  Bupivacaine [Experel])   EBL:  Total I/O In: -  Out: 110 [Urine:100; Blood:10]  Delay start of Pharmacological VTE agent (>24hrs) due to surgical blood loss or risk of bleeding:  no  DRAINS: none   SPECIMEN:  Hernia SAC  DISPOSITION OF SPECIMEN:  PATHOLOGY  COUNTS:  YES  PLAN OF CARE: Admit to inpatient   PATIENT DISPOSITION:  ICU - intubated and hemodynamically stable.  INDICATION: Pleasant patient has developed a ventral wall abdominal hernia.  LLQ Spigelian hernia incarcerated with colon.  Not reducible.  Recommendation was made for surgical repair:   The anatomy & physiology of the abdominal wall was discussed. The pathophysiology of hernias was discussed. Natural history risks without surgery including progeressive enlargement, pain, incarceration & strangulation was discussed. Contributors to complications such as smoking, obesity, diabetes, prior surgery, etc were discussed.  I feel the risks of no intervention will lead to serious problems that outweigh the operative risks; therefore, I recommended surgery to reduce and repair the hernia. I explained laparoscopic techniques with possible need for an open approach. I noted the probable use of mesh to patch and/or buttress the hernia repair  Risks such as bleeding, infection, abscess,  need for further treatment, heart attack, death, and other risks were discussed. I noted a good likelihood this will help address the problem. Goals of post-operative recovery were discussed as well. Possibility that this will not correct all symptoms was explained. I stressed the importance of low-impact activity, aggressive pain control, avoiding constipation, & not pushing through pain to minimize risk of post-operative chronic pain or injury. Possibility of reherniation especially with smoking, obesity, diabetes, immunosuppression, and other health conditions was discussed. We will work to minimize complications.  An educational handout further explaining the pathology & treatment options was given as well. Questions were answered. The patient expresses understanding & wishes to proceed with surgery.   OR FINDINGS:  6 x 3 cm left lower quadrant began early in hernia incarcerated with proximal sigmoid colon.  No evidence of strangulation.  Primary repair with underlay mesh.  Primary repair done with #1 PDS interrupted suture.   Type of repair - Laparoscopic underlay repair   Name of mesh - Bard Ventralight dual sided (polypropylene / Seprafilm)  Size of mesh - Height 15 cm, Width 20 cm  Orientation:  Transverse   Mesh overlap - 6-7 cm  Placement of mesh - Intraperitoneal underlay repair   DESCRIPTION:   Informed consent was confirmed. The patient underwent general anaesthesia without difficulty. The patient was positioned appropriately. VTE prevention in place. The patient's abdomen was clipped, prepped, & draped in a sterile fashion. Surgical timeout confirmed our plan.  The patient was positioned in reverse Trendelenburg. Abdominal entry was gained using optical entry technique in the right upper abdomen. Entry was clean. I induced carbon dioxide insufflation. Camera inspection revealed no injury. Extra ports were carefully placed under direct laparoscopic visualization.  I could see  the hernia in the left lower quadrant abdomen in the paramedian region.  Proximal sigmoid colon incarcerated within it.  Spigelian location.  I did laparoscopic lysis of adhesions to free off her chronic hernia sac and help reduce the colon out of the hernia sac and all the rest of its contents.  Colon was viable.  Somewhat twisted upon itself.  This was carefully straightened out with colon and sharp dissection.  I made sure hemostasis was good.    I mapped out the region using a needle passer.   To ensure that I would have at least 5 cm radial coverage outside of the hernia defect, I chose a 20x15 cm dual sided mesh.  I placed #1 Prolene stitches around its edge about every 5 cm = 10 total, sparing the left lateral edge.  I freed hernia sac and left lower quadrant peritoneum to help expose the inguinal region and left lateral flank region.  I opened up the skin over this beginning hernia.  I entered through the extremely thinned out anterior rectus fascia to find the hernia cavity.  Patient had very thickened and enlarged hernia sac.  I transected the hernia sac off to good result..  I rolled the mesh & placed into the peritoneal cavity through the hernia fascial defect.  I primarily repaired the beginning and hernia using interrupted #1 PDS suture to good result.  I reintroduced carbon dioxide insufflation.  I unrolled  the mesh and positioned it appropriately.  I secured the mesh to cover up the hernia defect using a laparoscopic suture passer to pass the tails of the Prolene through the abdominal wall & tagged them with clamps.  I started out in corners to make sure I had the mesh centered under the hernia defect appropriately, and then proceeded to work in quadrants.  I placed a rewarmed Prolene suture through the primary closure and out the fashion and out the mesh to help centrally tacked the mesh.  I also passed #1 Prolenes over the iliac crest and spine 2 for a good medial tacking of the mesh so that  the mesh could overlap more towards the flank.  I tied the fascial stitches down.   The mesh provided at least 6-7 cm circumferential coverage around the entire region of hernia defects.   I tacked the edges & central part of the mesh to the peritoneum/posterior rectus fascia with SecureStrap absorbable tacks.   I used that also helped tacked the peritoneum back up to help cover the lateral aspect of the mesh in the preperitoneal space.  A good field block of local anesthesia was used at fascial stitch sites & fascial closure areas.  Hemostasis was excellent.   I did reinspection. Hemostasis was good. Mesh laid well. Capnoperitoneum was evacuated. Ports were removed. The skin was closed with Monocryl at the port sites and Steri-Strips on the fascial stitch puncture sites.  The hernia wound was closed with 3-0 Vicryl deep dural suture, running 4-0 Monocryl suture, Steri-Strips.  Dressings applied.  The patient has not come off paralysis from its only succinylcholine dose.  Strongly suspicious for pseudocholinesterase deficiency.  Plan is to leave her intubated until she comes off paralysis naturally.  Allergies to succinylcholine and presumed pseudo-cholinesterase deficiency noted.  Anesthesia initially discussed with the patient's husband.  I discussed operative findings, updated the patient's status, discussed probable steps to recovery, and gave postoperative recommendations to the patient's spouse.  Recommendations were made.  Questions were answered.  He expressed understanding & appreciation.   Adin Hector, M.D., F.A.C.S. Gastrointestinal and Minimally Invasive Surgery [facility] [address], [place], [place] [address] [phone] Main / Paging

## 2015-09-04 ENCOUNTER — Encounter (HOSPITAL_COMMUNITY): Payer: Self-pay | Admitting: Surgery

## 2015-09-04 LAB — BASIC METABOLIC PANEL
Anion gap: 4 — ABNORMAL LOW (ref 5–15)
BUN: 10 mg/dL (ref 6–20)
CHLORIDE: 112 mmol/L — AB (ref 101–111)
CO2: 25 mmol/L (ref 22–32)
CREATININE: 0.64 mg/dL (ref 0.44–1.00)
Calcium: 8.6 mg/dL — ABNORMAL LOW (ref 8.9–10.3)
Glucose, Bld: 109 mg/dL — ABNORMAL HIGH (ref 65–99)
POTASSIUM: 3.6 mmol/L (ref 3.5–5.1)
SODIUM: 141 mmol/L (ref 135–145)

## 2015-09-04 LAB — CBC
HCT: 37.1 % (ref 36.0–46.0)
Hemoglobin: 12.2 g/dL (ref 12.0–15.0)
MCH: 29.8 pg (ref 26.0–34.0)
MCHC: 32.9 g/dL (ref 30.0–36.0)
MCV: 90.7 fL (ref 78.0–100.0)
PLATELETS: 235 10*3/uL (ref 150–400)
RBC: 4.09 MIL/uL (ref 3.87–5.11)
RDW: 14.8 % (ref 11.5–15.5)
WBC: 11.7 10*3/uL — AB (ref 4.0–10.5)

## 2015-09-04 MED ORDER — POLYETHYLENE GLYCOL 3350 17 G PO PACK: 17.0000 g | PACK | Freq: Two times a day (BID) | ORAL | Status: DC | PRN

## 2015-09-04 MED ORDER — SODIUM CHLORIDE 0.9% FLUSH
3.0000 mL | Freq: Two times a day (BID) | INTRAVENOUS | Status: DC
  Administered 2015-09-04 – 2015-09-05 (×4): 3 mL via INTRAVENOUS

## 2015-09-04 MED ORDER — SODIUM CHLORIDE 0.9 % IV SOLN: 250.0000 mL | INTRAVENOUS | Status: DC | PRN

## 2015-09-04 MED ORDER — ACETAMINOPHEN 500 MG PO TABS
1000.0000 mg | ORAL_TABLET | Freq: Three times a day (TID) | ORAL | Status: DC
  Administered 2015-09-04 – 2015-09-06 (×6): 1000 mg via ORAL
  Filled 2015-09-04 (×7): qty 2

## 2015-09-04 MED ORDER — LACTATED RINGERS IV BOLUS (SEPSIS): 1000.0000 mL | Freq: Three times a day (TID) | INTRAVENOUS | Status: AC | PRN

## 2015-09-04 MED ORDER — ENSURE ENLIVE PO LIQD
237.0000 mL | Freq: Two times a day (BID) | ORAL | Status: DC
  Administered 2015-09-04 – 2015-09-05 (×2): 237 mL via ORAL

## 2015-09-04 MED ORDER — CLONAZEPAM 0.5 MG PO TABS
0.5000 mg | ORAL_TABLET | Freq: Every evening | ORAL | Status: DC | PRN
  Filled 2015-09-04: qty 1

## 2015-09-04 MED ORDER — CETYLPYRIDINIUM CHLORIDE 0.05 % MT LIQD
7.0000 mL | Freq: Two times a day (BID) | OROMUCOSAL | Status: DC
Start: 2015-09-04 — End: 2015-09-06
  Administered 2015-09-04 – 2015-09-06 (×3): 7 mL via OROMUCOSAL

## 2015-09-04 MED ORDER — SODIUM CHLORIDE 0.9% FLUSH: 3.0000 mL | INTRAVENOUS | Status: DC | PRN

## 2015-09-04 MED ORDER — BISACODYL 10 MG RE SUPP: 10.0000 mg | Freq: Two times a day (BID) | RECTAL | Status: DC | PRN

## 2015-09-04 MED ORDER — PANTOPRAZOLE SODIUM 40 MG PO TBEC: 40.0000 mg | DELAYED_RELEASE_TABLET | Freq: Every day | ORAL | Status: DC | PRN

## 2015-09-04 NOTE — Progress Notes (Addendum)
[place]  [address] [place], [place] [address] Phone: [phone] FAX: [place] [phone] [birth year]  CARE TEAM:  PCP: Wenda Low, MD  Outpatient Care Team: Patient Care Team: Wenda Low, MD as PCP - General (Internal Medicine) [name], MD as Consulting Physician (Radiation Oncology) [name], MD as Medical Oncologist (Medical Oncology)  Inpatient Treatment Team: Treatment Team: Attending Provider: [name], MD; Consulting Physician: Nolon Nations, MD; Consulting Physician: Md Pccm, MD; Registered Nurse: Alfonse Spruce, RN; Registered Nurse: Casimer Bilis, RN  Problem List:   Principal Problem:   Incarcerated Spigelian ventral hernia s/p lap repair with mesh 09/03/2015 Active Problems:   PONV (postoperative nausea and vomiting)   Spigelian hernia-left   Anxiety and depression   Pseudocholinesterase deficiency   1 Day Post-Op  09/03/2015  POST-OPERATIVE DIAGNOSIS: Spigelian ventral hernia incarcerated with colon  PROCEDURE: LAPAROSCOPIC reduction & repair of incarcerated spigelian hernia with mesh  SURGEON: Surgeon(s): [name], MD   Assessment  Recovering  Plan:  -transfer floor -adv diet -wean IVF -improve pain control -try to transition to PO pain control -gentle bowel regimen if tolerating PO & no ileus -GERD Tx - PPI -VTE prophylaxis- SCDs, etc -mobilize as tolerated to help recovery  D/C patient from hospital when patient meets criteria (anticipate in 1-2 day(s)):  Tolerating oral intake well Ambulating well Adequate pain control without IV medications Urinating  Having flatus Disposition planning in place   I updated the patient's status to the patient and nurse.  Recommendations were made.  Questions were answered.  They expressed understanding & appreciation.   Adin Hector, M.D., F.A.C.S. Gastrointestinal and Minimally Invasive  Surgery [facility] [address] [place], [place] [address] [phone] Main / Paging   09/04/2015  Subjective:  Self extubated Up in chair "I don't hurt if I sit still" Wanting to avoid narcotics but agrees tylenol alone not enough  Objective:  Vital signs:  Filed Vitals:   09/03/15 2100 09/04/15 0000 09/04/15 0146 09/04/15 0400  BP: 93/35  133/45   Pulse: 61  57   Temp:  98.9 F (37.2 C)  98.3 F (36.8 C)  TempSrc:  Oral  Oral  Resp: 16  20   Height:      Weight:      SpO2: 94%  95%        Intake/Output   Yesterday:  07/01 0701 - 07/02 0700 In: 2061.7 [P.O.:940; I.V.:921.7; IV Piggyback:200] Out: 2290 [Urine:2290] This shift:     Bowel function:  Flatus: YES  BM:  No  Drain: (No drain)   Physical Exam:  General: Pt awake/alert/oriented x4 in No acute distress Eyes: PERRL, normal EOM.  Sclera clear.  No icterus.  Chronic left eye droop unchanged Neuro: CN II-XII intact w/o focal sensory/motor deficits. Lymph: No head/neck/groin lymphadenopathy Psych:  No delerium/psychosis/paranoia HENT: Normocephalic, Mucus membranes moist.  No thrush Neck: Supple, No tracheal deviation Chest: No chest wall pain w good excursion CV:  Pulses intact.  Regular rhythm MS: Normal AROM mjr joints.  No obvious deformity Abdomen: Soft.  Nondistended.  Mildly tender at incisions only.  No evidence of peritonitis.  No incarcerated hernias. Ext:  SCDs BLE.  No mjr edema.  No cyanosis Skin: No petechiae / purpura  Results:   Labs: Results for orders placed or performed during the hospital encounter of 09/02/15 (from the past 48 hour(s))  Urinalysis, Routine w reflex microscopic  Status: Abnormal   Collection Time: 09/02/15  9:09 PM  Result Value Ref Range   Color, Urine YELLOW YELLOW   APPearance CLEAR CLEAR   Specific Gravity, Urine 1.018 1.005 - 1.030   pH 6.0 5.0 - 8.0   Glucose, UA NEGATIVE NEGATIVE mg/dL   Hgb urine  dipstick TRACE (A) NEGATIVE   Bilirubin Urine NEGATIVE NEGATIVE   Ketones, ur NEGATIVE NEGATIVE mg/dL   Protein, ur NEGATIVE NEGATIVE mg/dL   Nitrite NEGATIVE NEGATIVE   Leukocytes, UA NEGATIVE NEGATIVE  Urine microscopic-add on     Status: Abnormal   Collection Time: 09/02/15  9:09 PM  Result Value Ref Range   Squamous Epithelial / LPF 0-5 (A) NONE SEEN   WBC, UA 0-5 0 - 5 WBC/hpf   RBC / HPF 0-5 0 - 5 RBC/hpf   Bacteria, UA RARE (A) NONE SEEN   Crystals CA OXALATE CRYSTALS (A) NEGATIVE   Urine-Other MUCOUS PRESENT   Comprehensive metabolic panel     Status: None   Collection Time: 09/02/15 10:01 PM  Result Value Ref Range   Sodium 138 135 - 145 mmol/L   Potassium 3.9 3.5 - 5.1 mmol/L   Chloride 111 101 - 111 mmol/L   CO2 22 22 - 32 mmol/L   Glucose, Bld 94 65 - 99 mg/dL   BUN 15 6 - 20 mg/dL   Creatinine, Ser 0.57 0.44 - 1.00 mg/dL   Calcium 9.1 8.9 - 10.3 mg/dL   Total Protein 6.7 6.5 - 8.1 g/dL   Albumin 4.1 3.5 - 5.0 g/dL   AST 18 15 - 41 U/L   ALT 24 14 - 54 U/L   Alkaline Phosphatase 89 38 - 126 U/L   Total Bilirubin 1.1 0.3 - 1.2 mg/dL   GFR calc non Af Amer >60 >60 mL/min   GFR calc Af Amer >60 >60 mL/min    Comment: (NOTE) The eGFR has been calculated using the CKD EPI equation. This calculation has not been validated in all clinical situations. eGFR's persistently <60 mL/min signify possible Chronic Kidney Disease.    Anion gap 5 5 - 15  CBC with Differential     Status: None   Collection Time: 09/02/15 10:01 PM  Result Value Ref Range   WBC 9.0 4.0 - 10.5 K/uL   RBC 4.82 3.87 - 5.11 MIL/uL   Hemoglobin 14.4 12.0 - 15.0 g/dL   HCT 43.4 36.0 - 46.0 %   MCV 90.0 78.0 - 100.0 fL   MCH 29.9 26.0 - 34.0 pg   MCHC 33.2 30.0 - 36.0 g/dL   RDW 14.6 11.5 - 15.5 %   Platelets 256 150 - 400 K/uL   Neutrophils Relative % 79 %   Neutro Abs 7.1 1.7 - 7.7 K/uL   Lymphocytes Relative 14 %   Lymphs Abs 1.3 0.7 - 4.0 K/uL   Monocytes Relative 5 %   Monocytes  Absolute 0.5 0.1 - 1.0 K/uL   Eosinophils Relative 2 %   Eosinophils Absolute 0.1 0.0 - 0.7 K/uL   Basophils Relative 0 %   Basophils Absolute 0.0 0.0 - 0.1 K/uL  Lipase, blood     Status: None   Collection Time: 09/02/15 10:01 PM  Result Value Ref Range   Lipase 31 11 - 51 U/L  MRSA PCR Screening     Status: None   Collection Time: 09/03/15  5:32 AM  Result Value Ref Range   MRSA by PCR NEGATIVE NEGATIVE    Comment:  The GeneXpert MRSA Assay (FDA approved for NASAL specimens only), is one component of a comprehensive MRSA colonization surveillance program. It is not intended to diagnose MRSA infection nor to guide or monitor treatment for MRSA infections.   Draw ABG 1 hour after initiation of ventilator     Status: Abnormal   Collection Time: 09/03/15  6:05 AM  Result Value Ref Range   FIO2 0.30    Delivery systems VENTILATOR    Mode PRESSURE REGULATED VOLUME CONTROL    VT 460 mL   LHR 18 resp/min   Peep/cpap 5.0 cm H20   pH, Arterial 7.398 7.350 - 7.450   pCO2 arterial 35.7 35.0 - 45.0 mmHg   pO2, Arterial 69.2 (L) 80.0 - 100.0 mmHg   Bicarbonate 21.7 20.0 - 24.0 mEq/L   TCO2 19.1 0 - 100 mmol/L   Acid-base deficit 2.2 (H) 0.0 - 2.0 mmol/L   O2 Saturation 94.2 %   Patient temperature 97.5    Collection site RIGHT RADIAL    Drawn by [id number]    Sample type ARTERIAL    Allens test (pass/fail) PASS PASS  Triglycerides     Status: Abnormal   Collection Time: 09/03/15  6:49 AM  Result Value Ref Range   Triglycerides 163 (H) <150 mg/dL    Comment: Performed at [facility]  Glucose, capillary     Status: Abnormal   Collection Time: 09/03/15  8:53 AM  Result Value Ref Range   Glucose-Capillary 123 (H) 65 - 99 mg/dL   Comment 1 Notify RN    Comment 2 Document in Chart   Basic metabolic panel     Status: Abnormal   Collection Time: 09/04/15  3:36 AM  Result Value Ref Range   Sodium 141 135 - 145 mmol/L   Potassium 3.6 3.5 - 5.1 mmol/L   Chloride 112  (H) 101 - 111 mmol/L   CO2 25 22 - 32 mmol/L   Glucose, Bld 109 (H) 65 - 99 mg/dL   BUN 10 6 - 20 mg/dL   Creatinine, Ser 0.64 0.44 - 1.00 mg/dL   Calcium 8.6 (L) 8.9 - 10.3 mg/dL   GFR calc non Af Amer >60 >60 mL/min   GFR calc Af Amer >60 >60 mL/min    Comment: (NOTE) The eGFR has been calculated using the CKD EPI equation. This calculation has not been validated in all clinical situations. eGFR's persistently <60 mL/min signify possible Chronic Kidney Disease.    Anion gap 4 (L) 5 - 15  CBC     Status: Abnormal   Collection Time: 09/04/15  3:36 AM  Result Value Ref Range   WBC 11.7 (H) 4.0 - 10.5 K/uL   RBC 4.09 3.87 - 5.11 MIL/uL   Hemoglobin 12.2 12.0 - 15.0 g/dL   HCT 37.1 36.0 - 46.0 %   MCV 90.7 78.0 - 100.0 fL   MCH 29.8 26.0 - 34.0 pg   MCHC 32.9 30.0 - 36.0 g/dL   RDW 14.8 11.5 - 15.5 %   Platelets 235 150 - 400 K/uL    Imaging / Studies: Ct Abdomen Pelvis W Contrast  09/03/2015  CLINICAL DATA:  Left abdominal pain.  Nausea. EXAM: CT ABDOMEN AND PELVIS WITH CONTRAST TECHNIQUE: Multidetector CT imaging of the abdomen and pelvis was performed using the standard protocol following bolus administration of intravenous contrast. CONTRAST:  129m ISOVUE-300 IOPAMIDOL (ISOVUE-300) INJECTION 61% COMPARISON:  12/14/2011 FINDINGS: Lower chest: Incompletely imaged mild patchy airspace opacity in the bases, right greater  than left, possibly atelectatic. Hepatobiliary: There are normal appearances of the liver, gallbladder and bile ducts. Pancreas: Normal Spleen: Normal Adrenals/Urinary Tract: The adrenals and kidneys are normal in appearance. There is no urinary calculus evident. There is no hydronephrosis or ureteral dilatation. Collecting systems and ureters appear unremarkable. Stomach/Bowel: There is a large hiatal hernia. Remainder of the stomach is unremarkable. Small bowel is normal. There is a ventral hernia at the lateral margin of the left rectus abdominus muscle at the level of  the iliac crest. This contains a loop of distal descending colon. The neck of the hernia appears to measure about 2.5 cm, but the fat within the hernia is inflamed, and the herniated portion of colon exhibits moderate mural edema. This suggests a component of strangulation within the hernia. No frank obstructive changes. No pneumatosis or extraluminal air. Colon is otherwise remarkable only for moderate diverticulosis. Vascular/Lymphatic: The abdominal aorta is normal in caliber. There is mild atherosclerotic calcification. There is no adenopathy in the abdomen or pelvis. Reproductive: Uterus and adnexal regions are unremarkable. Other: No ascites. Musculoskeletal: No significant skeletal lesion. There is a grade 1 spondylolisthesis at L4-5, and moderate lower lumbar facet degeneration. IMPRESSION: 1. 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 There is mild mural edema and inflammatory fat stranding within the hernia suggesting a component of strangulation. No frank obstruction or perforation. 2. Large hiatal hernia. 3. Diverticulosis. 4. Mild atelectatic appearing lung base opacities, right greater than left. Electronically Signed   By: Andreas Newport M.D.   On: 09/03/2015 00:28   Portable Chest Xray  09/03/2015  CLINICAL DATA:  Respiratory failure EXAM: PORTABLE CHEST 1 VIEW COMPARISON:  06/22/2014 FINDINGS: Endotracheal tube is 4.2 cm above the carina. Unchanged right hemidiaphragm elevation. Slight atelectatic appearing linear lung base opacities. No confluent consolidation. No large effusion. No pneumothorax. Normal pulmonary vasculature IMPRESSION: Satisfactory ET tube position.  No consolidation or large effusion. Electronically Signed   By: Andreas Newport M.D.   On: 09/03/2015 05:53    Medications / Allergies: per chart  Antibiotics: Anti-infectives    Start     Dose/Rate Route Frequency Ordered Stop   09/03/15 1000  valACYclovir (VALTREX) tablet 500 mg      500 mg Oral 2 times daily 09/03/15 0605     09/03/15 0630  ceFAZolin (ANCEF) IVPB 2g/100 mL premix     2 g 200 mL/hr over 30 Minutes Intravenous Every 8 hours 09/03/15 0605 09/03/15 1541   09/03/15 0200  ceFAZolin (ANCEF) IVPB 2g/100 mL premix     2 g 200 mL/hr over 30 Minutes Intravenous On call to O.R. 09/03/15 0140 09/03/15 0559   09/03/15 0200  metroNIDAZOLE (FLAGYL) IVPB 500 mg     500 mg 100 mL/hr over 60 Minutes Intravenous On call to O.R. 09/03/15 0140 09/03/15 0325        Note: Portions of this report may have been transcribed using voice recognition software. Every effort was made to ensure accuracy; however, inadvertent computerized transcription errors may be present.   Any transcriptional errors that result from this process are unintentional.     Adin Hector, M.D., F.A.C.S. Gastrointestinal and Minimally Invasive Surgery [facility] [address] [place], [place] [address] [phone] Main / Paging   09/04/2015

## 2015-09-04 NOTE — Progress Notes (Signed)
Post op Day 2 visit with pt regarding her likely pseudocholinesterase deficiency.  She required several hours of ventilation post op from one dose of Succinylcholine (100 mg) intraoperatively.  It appears from her surgical history that she may never have received the drug prior to this.  She has no memory of the post op ventilation necessary. She has a sore throat today which is not unexpected.  I explained the condition that likely caused her to need post op ventilation and left her with printed information concerning it.  It may be helpful for children she may have to be tested as well and wearing of a Medic Alert bracelet is sometimes helpful.  I left contact information for she and her husband if they desire further testing.

## 2015-09-04 NOTE — Progress Notes (Signed)
Pt transported by RN off of ICU unit at 1000. Vitals taken before transport. Pt now residing at room 1604.

## 2015-09-04 NOTE — Progress Notes (Signed)
Name: Kelly Maxwell MRN: [id number] DOB: [birth year]    ADMISSION DATE:  09/02/2015 CONSULTATION DATE:  09/04/2015  REFERRING MD :  Johney Maine, [facility]  CHIEF COMPLAINT:  Assist with postop mechanical ventilation  HISTORY OF PRESENT ILLNESS:  70 year old presented with abdominal pain and constipation, CT abdomen showed strangulated hernia and distal descending colon with a large hiatal hernia. She was noted to have this hernia back in 2013 but had held off surgery She was taken emergently for laparotomy with reduction and repair of hernia with mesh, but seemed to have prolonged recovery from neuromuscular blockade and hence was transferred to the ICU intubated.  She was maintained on propofol drip and seemed to awaken, propofol was stopped and she self extubated soon after  SIGNIFICANT EVENTS  7/1 >> laparotomy  STUDIES:  7/1 CT abd >> spigelian hernia with suggestion of strangulation  SUBJECTIVE:  Sitting up in chair having breakfast No dyspnea  C/o abd pain  VITAL SIGNS: Temp:  [97.9 F (36.6 C)-98.9 F (37.2 C)] 98.6 F (37 C) (07/02 0800) Pulse Rate:  [57-81] 73 (07/02 0955) Resp:  [14-22] 18 (07/02 0955) BP: (93-164)/(35-133) 161/58 mmHg (07/02 0955) SpO2:  [93 %-97 %] 97 % (07/02 0955)  PHYSICAL EXAMINATION: Gen. Pleasant, well-nourished, in no distress, normal affect ENT - no lesions, no post nasal drip Neck: No JVD, no thyromegaly, no carotid bruits Lungs: no use of accessory muscles, no dullness to percussion, clear without rales or rhonchi  Cardiovascular: Rhythm regular, heart sounds  normal, no murmurs, no peripheral edema Abdomen: soft and non-tender, distended,no hepatosplenomegaly, BS normal. Musculoskeletal: No deformities, no cyanosis or clubbing Neuro:  alert, non focal     Recent Labs Lab 09/02/15 2201 09/04/15 0336  NA 138 141  K 3.9 3.6  CL 111 112*  CO2 22 25  BUN 15 10  CREATININE 0.57 0.64  GLUCOSE 94 109*    Recent Labs Lab 09/02/15 2201  09/04/15 0336  HGB 14.4 12.2  HCT 43.4 37.1  WBC 9.0 11.7*  PLT 256 235   Ct Abdomen Pelvis W Contrast  09/03/2015  CLINICAL DATA:  Left abdominal pain.  Nausea. EXAM: CT ABDOMEN AND PELVIS WITH CONTRAST TECHNIQUE: Multidetector CT imaging of the abdomen and pelvis was performed using the standard protocol following bolus administration of intravenous contrast. CONTRAST:  124mL ISOVUE-300 IOPAMIDOL (ISOVUE-300) INJECTION 61% COMPARISON:  12/14/2011 FINDINGS: Lower chest: Incompletely imaged mild patchy airspace opacity in the bases, right greater than left, possibly atelectatic. Hepatobiliary: There are normal appearances of the liver, gallbladder and bile ducts. Pancreas: Normal Spleen: Normal Adrenals/Urinary Tract: The adrenals and kidneys are normal in appearance. There is no urinary calculus evident. There is no hydronephrosis or ureteral dilatation. Collecting systems and ureters appear unremarkable. Stomach/Bowel: There is a large hiatal hernia. Remainder of the stomach is unremarkable. Small bowel is normal. There is a ventral hernia at the lateral margin of the left rectus abdominus muscle at the level of the iliac crest. This contains a loop of distal descending colon. The neck of the hernia appears to measure about 2.5 cm, but the fat within the hernia is inflamed, and the herniated portion of colon exhibits moderate mural edema. This suggests a component of strangulation within the hernia. No frank obstructive changes. No pneumatosis or extraluminal air. Colon is otherwise remarkable only for moderate diverticulosis. Vascular/Lymphatic: The abdominal aorta is normal in caliber. There is mild atherosclerotic calcification. There is no adenopathy in the abdomen or pelvis. Reproductive: Uterus and adnexal regions  are unremarkable. Other: No ascites. Musculoskeletal: No significant skeletal lesion. There is a grade 1 spondylolisthesis at L4-5, and moderate lower lumbar facet degeneration. IMPRESSION:  1. 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 There is mild mural edema and inflammatory fat stranding within the hernia suggesting a component of strangulation. No frank obstruction or perforation. 2. Large hiatal hernia. 3. Diverticulosis. 4. Mild atelectatic appearing lung base opacities, right greater than left. Electronically Signed   By: Andreas Newport M.D.   On: 09/03/2015 00:28   Portable Chest Xray  09/03/2015  CLINICAL DATA:  Respiratory failure EXAM: PORTABLE CHEST 1 VIEW COMPARISON:  06/22/2014 FINDINGS: Endotracheal tube is 4.2 cm above the carina. Unchanged right hemidiaphragm elevation. Slight atelectatic appearing linear lung base opacities. No confluent consolidation. No large effusion. No pneumothorax. Normal pulmonary vasculature IMPRESSION: Satisfactory ET tube position.  No consolidation or large effusion. Electronically Signed   By: Andreas Newport M.D.   On: 09/03/2015 05:53    ASSESSMENT / PLAN:  Postop respiratory failure-due to prolonged recovery from neuromuscular blockade -stable post extubation -can use morphine as needed for pain -Can use Ativan as needed for anxiety -follow anesthesia recommendation re: possible pseudocholinesterase deficiency  Postop care per surgery  [facility] M available as needed  Kara Mead MD. FCCP. [facility] Pager [phone] If no response call [phone]     09/04/2015, 10:36 AM

## 2015-09-05 NOTE — Progress Notes (Signed)
2 Days Post-Op  Subjective: She looks great, sites look fine.  She ate regular breakfast and has had a BM.  She is very concerned with pain.    Objective: Vital signs in last 24 hours: Temp:  [98.1 F (36.7 C)-98.6 F (37 C)] 98.1 F (36.7 C) (07/03 0601) Pulse Rate:  [61-96] 78 (07/03 0601) Resp:  [16-19] 16 (07/03 0601) BP: (116-161)/(58-65) 126/64 mmHg (07/03 0601) SpO2:  [92 %-97 %] 92 % (07/03 0601) Last BM Date: 09/04/15 320 PO Urine 950 Stool x 1  Intake/Output from previous day: 07/02 0701 - 07/03 0700 In: 320 [P.O.:320] Out: 950 [Urine:950] Intake/Output this shift: Total I/O In: -  Out: 200 [Urine:200]  General appearance: alert, cooperative and no distress Resp: clear to auscultation bilaterally GI: soft, sore, sites all look fine.    Lab Results:   Recent Labs  09/02/15 2201 09/04/15 0336  WBC 9.0 11.7*  HGB 14.4 12.2  HCT 43.4 37.1  PLT 256 235    BMET  Recent Labs  09/02/15 2201 09/04/15 0336  NA 138 141  K 3.9 3.6  CL 111 112*  CO2 22 25  GLUCOSE 94 109*  BUN 15 10  CREATININE 0.57 0.64  CALCIUM 9.1 8.6*   PT/INR No results for input(s): LABPROT, INR in the last 72 hours.   Recent Labs Lab 09/02/15 2201  AST 18  ALT 24  ALKPHOS 89  BILITOT 1.1  PROT 6.7  ALBUMIN 4.1     Lipase     Component Value Date/Time   LIPASE 31 09/02/2015 2201     Studies/Results: No results found. Prior to Admission medications   Medication Sig Start Date End Date Taking? Authorizing Provider  buPROPion (WELLBUTRIN XL) 300 MG 24 hr tablet Take 1 tablet (300 mg total) by mouth every morning. 12/14/11  Yes Consuela Mimes, MD  calcium-vitamin D (CALCIUM + D) 250-125 MG-UNIT per tablet Take 1 tablet by mouth 2 (two) times daily. Patient taking differently: Take 1 tablet by mouth daily.  05/26/13  Yes Consuela Mimes, MD  clonazePAM (KLONOPIN) 0.5 MG tablet Take 0.5 mg by mouth at bedtime as needed for anxiety.   Yes Historical Provider, MD  LORazepam  (ATIVAN) 1 MG tablet Take 1 mg by mouth every 6 (six) hours as needed for anxiety.   Yes Historical Provider, MD  Melatonin 1 MG CAPS Take 3 capsules by mouth at bedtime as needed (sleep).    Yes Historical Provider, MD  Multiple Vitamin (MULTIVITAMIN) tablet Take 1 tablet by mouth daily.   Yes Historical Provider, MD  pantoprazole (PROTONIX) 40 MG tablet Take 40 mg by mouth daily as needed (heartburn/ acid reflux).    Yes Historical Provider, MD  simvastatin (ZOCOR) 40 MG tablet Take 20 mg by mouth daily at 6 PM.    Yes Historical Provider, MD  tretinoin (RETIN-A) 0.05 % cream Apply 1 application topically at bedtime as needed. spots 06/24/15  Yes Historical Provider, MD  valACYclovir (VALTREX) 500 MG tablet Take 500 mg by mouth 2 (two) times daily. 08/17/15  Yes Historical Provider, MD  Bromfenac Sodium (PROLENSA) 0.07 % SOLN Place 1 drop into the right eye 3 (three) times daily. Reported on 09/02/2015    Historical Provider, MD  Difluprednate (DUREZOL OP) Place 1 drop into the right eye 2 (two) times daily. Reported on 09/02/2015    Historical Provider, MD  methocarbamol (ROBAXIN) 750 MG tablet Take 1 tablet (750 mg total) by mouth 4 (four) times daily as needed (  use for muscle cramps/pain). 09/03/15   [name], MD  oxyCODONE (OXY IR/ROXICODONE) 5 MG immediate release tablet Take 1-2 tablets (5-10 mg total) by mouth every 4 (four) hours as needed for moderate pain, severe pain or breakthrough pain. 09/03/15   [name], MD     Medications: . acetaminophen  1,000 mg Oral TID  . antiseptic oral rinse  7 mL Mouth Rinse BID  . bisacodyl  10 mg Rectal Daily  . buPROPion  300 mg Oral [id number]  . Chlorhexidine Gluconate Cloth  6 each Topical Once  . enoxaparin (LOVENOX) injection  40 mg Subcutaneous Q24H  . feeding supplement (ENSURE ENLIVE)  237 mL Oral BID BM  . lip balm  1 application Topical BID  . multivitamin with minerals  1 tablet Oral Daily  . ondansetron (ZOFRAN) IV  4 mg Intravenous Once   . simvastatin  20 mg Oral q1800  . sodium chloride flush  3 mL Intravenous Q12H  . valACYclovir  500 mg Oral BID     No IV's  Hx of Glioblastoma left temporal brain tumor, with craniotomy/tumor excision/ radiation treatment, 12/2011 Headaches GERD Depression  Assessment/Plan  Spigelian ventral hernia incarcerated with colon LAPAROSCOPIC reduction & repair of incarcerated spigelian hernia with mesh, 09/03/15, Dr. [name]  POD 2 FEN:  Cardiac diet  ID: Ancef/Flagyl pre op - Valacyclovir daily DVT:  Lovenox/SCD  Plan:  She has not been up walking yet and is concerned with pain control.  Will let her have 10 mg oxycodone and see how she does.  Ask PT to walk with her and be sure her she is stable and safe walking.  She is back on her home Meds.  She will be ready to go home when she feels comfortable.     LOS: 2 days    Jaideep Pollack 09/05/2015 [phone]

## 2015-09-05 NOTE — Evaluation (Signed)
Physical Therapy Evaluation Patient Details Name: CYLINDA NEILSEN MRN: [id number] DOB: 1945-11-22 Today's Date: 09/05/2015   History of Present Illness  70 yo female s/p ventral hernia repair.   Clinical Impression  On eval, pt was Min guard assist for mobility-walked ~75 feet with RW. Slow gait speed. Increased time to complete all tasks. Recommend RW use for ambulation.     Follow Up Recommendations Supervision/Assistance - 24 hour; Supervision/assist for OOB/mobility    Equipment Recommendations  None recommended by PT (pt states she already has a walker. )    Recommendations for Other Services       Precautions / Restrictions Precautions Precaution Comments: abd surgery Restrictions Weight Bearing Restrictions: No      Mobility  Bed Mobility               General bed mobility comments: oob in chair  Transfers Overall transfer level: Needs assistance Equipment used: Rolling walker (2 wheeled) Transfers: Sit to/from Stand Sit to Stand: Min guard         General transfer comment: close guard for safety. Increased time.   Ambulation/Gait Ambulation/Gait assistance: Min guard Ambulation Distance (Feet): 75 Feet Assistive device: Rolling walker (2 wheeled) Gait Pattern/deviations: Step-through pattern;Decreased stride length     General Gait Details: slow gait speed. Pt c/o nausea.   Stairs            Wheelchair Mobility    Modified Rankin (Stroke Patients Only)       Balance                                             Pertinent Vitals/Pain Pain Assessment: 0-10 Pain Score: 5  Pain Location: abdomen with activity Pain Intervention(s): Limited activity within patient's tolerance    Home Living Family/patient expects to be discharged to:: Private residence Living Arrangements: Spouse/significant other   Type of Home: House Home Access: Stairs to enter Entrance Stairs-Rails: Right Entrance Stairs-Number of Steps:  4-5 Home Layout: Two level;Bed/bath upstairs;1/2 bath on main level Home Equipment: Walker - 2 wheels      Prior Function Level of Independence: Independent               Hand Dominance        Extremity/Trunk Assessment   Upper Extremity Assessment: Overall WFL for tasks assessed           Lower Extremity Assessment: Generalized weakness      Cervical / Trunk Assessment: Normal  Communication   Communication: No difficulties  Cognition Arousal/Alertness: Awake/alert Behavior During Therapy: WFL for tasks assessed/performed Overall Cognitive Status: Within Functional Limits for tasks assessed (slow to respond to questions at times)                      General Comments      Exercises        Assessment/Plan    PT Assessment Patient needs continued PT services  PT Diagnosis Difficulty walking;Generalized weakness;Acute pain   PT Problem List Decreased activity tolerance;Decreased balance;Decreased mobility;Pain;Decreased knowledge of use of DME  PT Treatment Interventions Gait training;Functional mobility training;Therapeutic activities;Patient/family education;Balance training;Therapeutic exercise;DME instruction   PT Goals (Current goals can be found in the Care Plan section) Acute Rehab PT Goals Patient Stated Goal: less pain.  PT Goal Formulation: With patient Time For Goal Achievement: 09/19/15 Potential to Achieve Goals: Good  Frequency Min 3X/week   Barriers to discharge        Co-evaluation               End of Session   Activity Tolerance: Patient tolerated treatment well Patient left: in chair;with call bell/phone within reach           Time: 1512-1536 PT Time Calculation (min) (ACUTE ONLY): 24 min   Charges:   PT Evaluation $PT Eval Low Complexity: 1 Procedure     PT G Codes:        Weston Anna, MPT Pager: [phone]

## 2015-09-05 NOTE — Discharge Summary (Signed)
Physician Discharge Summary  Patient ID: Kelly Maxwell MRN: [id number] DOB/AGE: [birth year] 70 y.o.  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 Cyndia Bent Course:  History of brain tumor status post radiosurgery with no evidence of recurrence, follwed at [facility] Was noted to have an abdominal wall Spigelian hernia on a CT scan in 2013. Had some discomfort with it. Surgical consultation made. Surgery recommended in 2015 by my partner, Dr. Zella Richer. Patient held off. Patient's not had much problems there for the past six months. However for at least five days she has not had a bowel movement. Decreased flatus. Some fullness. Some abdominal discomfort as well. Came to the emergency room tonight, Friday night. In left lower quadrant of abdomen. CT scan confirmed confirms larger spaghetti and hernia now with a knuckle of colon within it. Some inflammation but no definite perforation or gangrene. Because of the incarcerated hernia does not seem to be reducible, emergent surgical consultation requested.  Patient is here with her husband at the bedside. She has been able to tolerate liquids. Some nausea but no vomiting. Feels sore in the left lower side. Tried oral laxatives & a few enemas no bowel movements. A little bit of flatus. She has never had abdominal surgeries. She can walk 20 minutes without difficulty. She does not  smoke. No history of stroke or heart attack She was seen in the ED and taken to the OR at that time for her incarcerated hernia.  Pt under went procedure as noted above.  Post op she had trouble with the paralytics and had to remain on the Ventilator post op.  She was transferred to the ICU.  She has a history of this with past anesthesia's.  She self extubated herself early AM of 09/03/15.  Morphine was used for pain control.  She was transferred to the floor and started on PO's.   Anesthesia followed her post op and Dr. Al Corpus discussed probable pseudocholinesterase deficiency.  She was given information and recommendation she share this with her children.  By 09/05/15, she was doing well.  Tolerating diet, voiding and had a BM.  She is anxious about going home and we discussed walking here and PT evaluation to be sure she is safe with ambulation when walking. She was converted to oral pain meds, and mobilized more.  By the following AM she was more comfortable and ready for discharge  CBC Latest Ref Rng 09/04/2015 09/02/2015 09/11/2013  WBC 4.0 - 10.5 K/uL 11.7(H) 9.0 5.8  Hemoglobin 12.0 - 15.0 g/dL 12.2 14.4 14.1  Hematocrit 36.0 - 46.0 % 37.1 43.4 41.9  Platelets 150 - 400 K/uL 235 256 248   CMP Latest Ref Rng 09/04/2015 09/02/2015 10/29/2014  Glucose 65 - 99 mg/dL 109(H) 94 -  BUN 6 - 20 mg/dL 10 15 13.3  Creatinine 0.44 - 1.00 mg/dL 0.64 0.57 0.8  Sodium 135 - 145 mmol/L 141 138 -  Potassium 3.5 - 5.1 mmol/L 3.6 3.9 -  Chloride 101 - 111 mmol/L 112(H) 111 -  CO2 22 - 32  mmol/L 25 22 -  Calcium 8.9 - 10.3 mg/dL 8.6(L) 9.1 -  Total Protein 6.5 - 8.1 g/dL - 6.7 -  Total Bilirubin 0.3 - 1.2 mg/dL - 1.1 -  Alkaline Phos 38 - 126 U/L - 89 -  AST 15 - 41 U/L - 18 -  ALT 14 - 54 U/L - 24 -       Disposition: 01-Home or Self Care      Discharge Instructions    Call MD for:  extreme fatigue    Complete by:  As directed      Call MD for:  hives    Complete by:  As directed      Call MD for:   persistant nausea and vomiting    Complete by:  As directed      Call MD for:  redness, tenderness, or signs of infection (pain, swelling, redness, odor or green/yellow discharge around incision site)    Complete by:  As directed      Call MD for:  severe uncontrolled pain    Complete by:  As directed      Call MD for:    Complete by:  As directed   Temperature > 101.99F     Diet - low sodium heart healthy    Complete by:  As directed   Start with bland, low residue diet for a few days, then advance to a heart healthy (low fat, high fiber) diet.  If you feel nauseated or constipated, simplify to a liquid only diet for 48 hours until you are feeling better (no more nausea, farting/passing gas, having a bowel movement, etc...).  If you cannot tolerate even drinking liquids, or feeling worse, let your surgeon know or go to the Emergency Department for help.     Discharge instructions    Complete by:  As directed   Please see discharge instruction sheets.   Also refer to any handouts/printouts that may have been given from the [facility] surgery office (if you visited [place] there before surgery) Please call our office if you have any questions or concerns [phone]     Discharge patient    Complete by:  As directed      Discharge wound care:    Complete by:  As directed   If you have closed incisions: Shower and bathe over these incisions with soap and water every day.  It is OK to wash over the dressings: they are waterproof. Remove all surgical dressings on postoperative day #3.  You do not need to replace dressings over the closed incisions unless you feel more comfortable with a Band-Aid covering it.   If you have an open wound: That requires packing, so please see wound care instructions.   In general, remove all dressings, wash wound with soap and water and then replace with saline moistened gauze.  Do the dressing change at least every day.    Please call our office [phone] if you have  further questions.     Driving Restrictions    Complete by:  As directed   No driving until off narcotics and can safely swerve away without pain during an emergency     Increase activity slowly    Complete by:  As directed      Lifting restrictions    Complete by:  As directed   Avoid heavy lifting initially, <20 pounds at first.   Do not push through pain.   You have no specific weight limit:  If it hurts to do, DON'T DO IT.    If you feel no pain, you are not injuring anything.  Pain will protect you from injury.   Coughing and sneezing are far more stressful to your incision than any lifting.   Avoid resuming heavy lifting (>50 pounds) or other intense activity until off all narcotic pain medications.   When want to exercise more, give yourself 2 weeks to gradually get back to full intense exercise/activity.     May shower / Bathe    Complete by:  As directed   [place].  It is fine for dressings or wounds to be washed/rinsed.  Use gentle soap & water.  This will help the incisions and/or wounds get clean & minimize infection.     May walk up steps    Complete by:  As directed      Sexual Activity Restrictions    Complete by:  As directed   Sexual activity as tolerated.  Do not push through pain.  Pain will protect you from injury.     Walk with assistance    Complete by:  As directed   Walk over an hour a day.  May use a walker/cane/companion to help with balance and stamina.            Medication List    TAKE these medications        buPROPion 300 MG 24 hr tablet  Commonly known as:  WELLBUTRIN XL  Take 1 tablet (300 mg total) by mouth every morning.     calcium-vitamin D 250-125 MG-UNIT tablet  Commonly known as:  CALCIUM + D  Take 1 tablet by mouth 2 (two) times daily.     clonazePAM 0.5 MG tablet  Commonly known as:  KLONOPIN  Take 0.5 mg by mouth at bedtime as needed for anxiety.     DUREZOL OP  Place 1 drop into the right eye 2 (two) times daily.  Reported on 09/02/2015     LORazepam 1 MG tablet  Commonly known as:  ATIVAN  Take 1 mg by mouth every 6 (six) hours as needed for anxiety.     Melatonin 1 MG Caps  Take 3 capsules by mouth at bedtime as needed (sleep).     methocarbamol 750 MG tablet  Commonly known as:  ROBAXIN  Take 1 tablet (750 mg total) by mouth 4 (four) times daily as needed (use for muscle cramps/pain).     multivitamin tablet  Take 1 tablet by mouth daily.     oxyCODONE 5 MG immediate release tablet  Commonly known as:  Oxy IR/ROXICODONE  Take 1-2 tablets (5-10 mg total) by mouth every 4 (four) hours as needed for moderate pain, severe pain or breakthrough pain.     pantoprazole 40 MG tablet  Commonly known as:  PROTONIX  Take 40 mg by mouth daily as needed (heartburn/ acid reflux).     PROLENSA 0.07 % Soln  Generic drug:  Bromfenac Sodium  Place 1 drop into the right eye 3 (three) times daily. Reported on 09/02/2015     simvastatin 40 MG tablet  Commonly known as:  ZOCOR  Take 20 mg by mouth daily at 6 PM.     tretinoin 0.05 % cream  Commonly known as:  RETIN-A  Apply 1 application topically at bedtime as needed. spots     valACYclovir 500 MG tablet  Commonly known as:  VALTREX  Take 500 mg by mouth 2 (two) times  daily.       Follow-up Information    Follow up with Adin Hector., MD On 09/28/2015.   Specialty:  General Surgery   Why:  To follow up after your operation, To follow up after your hospital stay.  Your appointment is at 4 PM, be there 30 minutes early for check in.   Contact information:   [address] [place] [address] [phone]       Signed: Earnstine Regal 09/08/2015, 4:07 PM

## 2015-09-06 NOTE — Progress Notes (Addendum)
RN reviewed discharge instructions with patient and family. RN reviewed pain management, bowel habits, dressing and incision care, showering, ambulating, and follow up appointment with MD.  All questions answered.   Paperwork and prescriptions given.   NT rolled patient down with all belongings to family car.

## 2015-09-06 NOTE — Care Management Note (Signed)
Case Management Note  Patient Details  Name: Kelly Maxwell MRN: [id number] Date of Birth: May 24, 1945  Subjective/Objective:      70 yo admitted for LAPAROSCOPIC reduction & repair of incarcerated spigelian hernia with mesh              Action/Plan: From home with spouse.  Pt states she has a rolling walker at home. Choice offered for [facility] services and [facility] chosen. [facility] rep contacted for referral.  Pt states someone will be staying with her all the time at home when she is discharged.  No other DC needs communicated.  Expected Discharge Date:                  Expected Discharge Plan:  [place]  In-House Referral:     Discharge planning Services  CM Consult  Post Acute Care Choice:  Home Health Choice offered to:  Patient  DME Arranged:    DME Agency:     HH Arranged:  PT [place]:  [place]  Status of Service:  In process, will continue to follow  If discussed at Long Length of Stay Meetings, dates discussed:    Additional Comments:  Lynnell Catalan, RN 09/06/2015, 9:36 AM

## 2015-09-06 NOTE — Progress Notes (Signed)
Patient ID: Kelly Maxwell, female   DOB: [birth year], 70 y.o.   MRN: [id number] 3 Days Post-Op  Subjective: Feeling gradually better. Pain at hernia site went up and walking. Feels fine at rest. Tolerating regular diet. Feels ready to go home.  Objective: Vital signs in last 24 hours: Temp:  [98 F (36.7 C)-98.8 F (37.1 C)] 98 F (36.7 C) (07/04 0453) Pulse Rate:  [66-75] 67 (07/04 0453) Resp:  [16] 16 (07/04 0453) BP: (105-142)/(39-67) 119/63 mmHg (07/04 0453) SpO2:  [93 %-94 %] 93 % (07/04 0453) Last BM Date: 09/04/15  Intake/Output from previous day: 07/03 0701 - 07/04 0700 In: 720 [P.O.:720] Out: 500 [Urine:500] Intake/Output this shift:    General appearance: alert, cooperative and no distress GI: ggenerally soft and nontender with some appropriate tenderness at hernia site Incision/Wound: some bruising, no swelling or erythema  Lab Results:   Recent Labs  09/04/15 0336  WBC 11.7*  HGB 12.2  HCT 37.1  PLT 235   BMET  Recent Labs  09/04/15 0336  NA 141  K 3.6  CL 112*  CO2 25  GLUCOSE 109*  BUN 10  CREATININE 0.64  CALCIUM 8.6*     Studies/Results: No results found.  Anti-infectives: Anti-infectives    Start     Dose/Rate Route Frequency Ordered Stop   09/03/15 1000  valACYclovir (VALTREX) tablet 500 mg     500 mg Oral 2 times daily 09/03/15 0605     09/03/15 0630  ceFAZolin (ANCEF) IVPB 2g/100 mL premix     2 g 200 mL/hr over 30 Minutes Intravenous Every 8 hours 09/03/15 0605 09/03/15 1541   09/03/15 0200  ceFAZolin (ANCEF) IVPB 2g/100 mL premix     2 g 200 mL/hr over 30 Minutes Intravenous On call to O.R. 09/03/15 0140 09/03/15 0559   09/03/15 0200  metroNIDAZOLE (FLAGYL) IVPB 500 mg     500 mg 100 mL/hr over 60 Minutes Intravenous On call to O.R. 09/03/15 0140 09/03/15 0325      Assessment/Plan: s/p Procedure(s): LAPAROSCOPIC repair of incarcerated spigelian hernia with mesh, reduction and repair  Improving postoperatively without  apparent complication. Ready for discharge today.Home PT ordered.    LOS: 3 days    Takuya Lariccia T 09/06/2015

## 2015-09-07 ENCOUNTER — Encounter (HOSPITAL_COMMUNITY): Payer: Self-pay | Admitting: Surgery

## 2015-09-07 ENCOUNTER — Inpatient Hospital Stay: Admission: RE | Admit: 2015-09-07 | Payer: Medicare Other | Source: Ambulatory Visit

## 2015-09-27 ENCOUNTER — Ambulatory Visit
Admission: RE | Admit: 2015-09-27 | Discharge: 2015-09-27 | Disposition: A | Discharge status: Home / Self Care | Payer: Medicare Other | Source: Ambulatory Visit | Attending: Radiation Oncology | Admitting: Radiation Oncology

## 2015-09-27 ENCOUNTER — Encounter: Payer: Self-pay | Admitting: Radiation Oncology

## 2015-09-27 DIAGNOSIS — D496 Neoplasm of unspecified behavior of brain: Secondary | ICD-10-CM

## 2015-09-27 MED ORDER — GADOBENATE DIMEGLUMINE 529 MG/ML IV SOLN
16.0000 mL | Freq: Once | INTRAVENOUS | Status: AC | PRN
  Administered 2015-09-27: 16 mL via INTRAVENOUS

## 2015-10-04 ENCOUNTER — Encounter: Payer: Self-pay | Admitting: Radiation Oncology

## 2015-10-05 ENCOUNTER — Telehealth: Payer: Self-pay | Admitting: Radiation Oncology

## 2015-10-05 NOTE — Telephone Encounter (Signed)
Phoned patient to inform her of Dr. Johny Shears response to her question. No answer. Left message requesting return call.

## 2015-10-05 NOTE — Telephone Encounter (Signed)
-----   Message from [name], MD sent at 10/05/2015 12:49 PM EDT ----- Regarding: RE: Conflicting Thoughts The timing is maybe more worrisome for tumor than recurrence.  Temodar is a relatively well tolerated treatment.  I think I would err on the side of being cautious and take the Temodar.     ----- Message ----- From: Heywood Footman, RN Sent: 10/05/2015  11:34 AM To: [name], MD Subject: Conflicting Thoughts                           Dr. Tammi Klippel. I received the following message from [facility]. Please let me know how you would like me to respond. Thank you. Sam  Dr. Tammi Klippel,  Our new oncologist at [place] (Dr. Wynetta Emery) believes the new MRI shows tumor regrowth (and is recommending daily low grade Temodar). However, Dr. Isabel Caprice written impression leans towards radiation effect necrosis and states, "The possibility of recurrent tumor does exist but is not favored." We're not sure how to proceed. Can you recommend another opinion or shed more light on the 2 conflicting diagnoses? Thank you.

## 2015-10-10 ENCOUNTER — Encounter: Payer: Self-pay | Admitting: Radiation Oncology

## 2015-10-10 ENCOUNTER — Telehealth: Payer: Self-pay | Admitting: Radiation Oncology

## 2015-10-10 NOTE — Telephone Encounter (Signed)
Left second telephone message attempting to inform patient of Dr. Johny Shears response to her MyChart message.

## 2015-11-29 ENCOUNTER — Other Ambulatory Visit: Payer: Self-pay | Admitting: Oncology

## 2015-12-01 ENCOUNTER — Other Ambulatory Visit: Payer: Self-pay | Admitting: Oncology

## 2015-12-01 DIAGNOSIS — C719 Malignant neoplasm of brain, unspecified: Secondary | ICD-10-CM

## 2016-01-16 ENCOUNTER — Other Ambulatory Visit: Payer: Medicare Other

## 2016-01-17 ENCOUNTER — Ambulatory Visit
Admission: RE | Admit: 2016-01-17 | Discharge: 2016-01-17 | Disposition: A | Discharge status: Home / Self Care | Payer: Medicare Other | Source: Ambulatory Visit | Attending: Oncology | Admitting: Oncology

## 2016-01-17 DIAGNOSIS — C719 Malignant neoplasm of brain, unspecified: Secondary | ICD-10-CM

## 2016-01-17 MED ORDER — GADOBENATE DIMEGLUMINE 529 MG/ML IV SOLN
16.0000 mL | Freq: Once | INTRAVENOUS | Status: AC | PRN
  Administered 2016-01-17: 16 mL via INTRAVENOUS

## 2016-02-29 ENCOUNTER — Other Ambulatory Visit: Payer: Self-pay | Admitting: Physician Assistant

## 2016-02-29 ENCOUNTER — Other Ambulatory Visit: Payer: Self-pay | Admitting: *Deleted

## 2016-02-29 DIAGNOSIS — R5382 Chronic fatigue, unspecified: Secondary | ICD-10-CM

## 2016-02-29 DIAGNOSIS — C719 Malignant neoplasm of brain, unspecified: Secondary | ICD-10-CM

## 2016-03-12 ENCOUNTER — Other Ambulatory Visit: Payer: Medicare Other

## 2016-03-13 ENCOUNTER — Ambulatory Visit
Admission: RE | Admit: 2016-03-13 | Discharge: 2016-03-13 | Disposition: A | Discharge status: Home / Self Care | Payer: Medicare HMO | Source: Ambulatory Visit | Attending: Physician Assistant | Admitting: Physician Assistant

## 2016-03-13 DIAGNOSIS — R5382 Chronic fatigue, unspecified: Secondary | ICD-10-CM

## 2016-03-13 DIAGNOSIS — C719 Malignant neoplasm of brain, unspecified: Secondary | ICD-10-CM | POA: Diagnosis not present

## 2016-03-13 MED ORDER — GADOBENATE DIMEGLUMINE 529 MG/ML IV SOLN
16.0000 mL | Freq: Once | INTRAVENOUS | Status: AC | PRN
  Administered 2016-03-13: 16 mL via INTRAVENOUS

## 2016-03-14 DIAGNOSIS — R5382 Chronic fatigue, unspecified: Secondary | ICD-10-CM | POA: Diagnosis not present

## 2016-03-14 DIAGNOSIS — C719 Malignant neoplasm of brain, unspecified: Secondary | ICD-10-CM | POA: Diagnosis not present

## 2016-03-14 DIAGNOSIS — C718 Malignant neoplasm of overlapping sites of brain: Secondary | ICD-10-CM | POA: Diagnosis not present

## 2016-03-20 DIAGNOSIS — R69 Illness, unspecified: Secondary | ICD-10-CM | POA: Diagnosis not present

## 2016-04-11 DIAGNOSIS — C719 Malignant neoplasm of brain, unspecified: Secondary | ICD-10-CM | POA: Diagnosis not present

## 2016-04-11 DIAGNOSIS — C711 Malignant neoplasm of frontal lobe: Secondary | ICD-10-CM | POA: Diagnosis not present

## 2016-04-11 DIAGNOSIS — C712 Malignant neoplasm of temporal lobe: Secondary | ICD-10-CM | POA: Diagnosis not present

## 2016-04-30 ENCOUNTER — Other Ambulatory Visit (HOSPITAL_COMMUNITY): Payer: Self-pay | Admitting: Diagnostic Radiology

## 2016-04-30 ENCOUNTER — Other Ambulatory Visit: Payer: Self-pay | Admitting: Acute Care

## 2016-04-30 DIAGNOSIS — C719 Malignant neoplasm of brain, unspecified: Secondary | ICD-10-CM

## 2016-05-08 ENCOUNTER — Ambulatory Visit
Admission: RE | Admit: 2016-05-08 | Discharge: 2016-05-08 | Disposition: A | Discharge status: Home / Self Care | Payer: Medicare HMO | Source: Ambulatory Visit | Attending: Acute Care | Admitting: Acute Care

## 2016-05-08 DIAGNOSIS — C719 Malignant neoplasm of brain, unspecified: Secondary | ICD-10-CM | POA: Diagnosis not present

## 2016-05-08 MED ORDER — GADOBENATE DIMEGLUMINE 529 MG/ML IV SOLN
17.0000 mL | Freq: Once | INTRAVENOUS | Status: AC | PRN
  Administered 2016-05-08: 17 mL via INTRAVENOUS

## 2016-05-09 DIAGNOSIS — C718 Malignant neoplasm of overlapping sites of brain: Secondary | ICD-10-CM | POA: Diagnosis not present

## 2016-05-09 DIAGNOSIS — C719 Malignant neoplasm of brain, unspecified: Secondary | ICD-10-CM | POA: Diagnosis not present

## 2016-05-11 ENCOUNTER — Emergency Department (HOSPITAL_COMMUNITY): Payer: Medicare HMO

## 2016-05-11 ENCOUNTER — Emergency Department (HOSPITAL_COMMUNITY)
Admission: EM | Admit: 2016-05-11 | Discharge: 2016-05-11 | Disposition: A | Discharge status: Home / Self Care | Payer: Medicare HMO | Attending: Emergency Medicine | Admitting: Emergency Medicine

## 2016-05-11 ENCOUNTER — Encounter (HOSPITAL_COMMUNITY): Payer: Self-pay | Admitting: Emergency Medicine

## 2016-05-11 DIAGNOSIS — H538 Other visual disturbances: Secondary | ICD-10-CM | POA: Diagnosis not present

## 2016-05-11 DIAGNOSIS — Z859 Personal history of malignant neoplasm, unspecified: Secondary | ICD-10-CM | POA: Insufficient documentation

## 2016-05-11 DIAGNOSIS — R4701 Aphasia: Secondary | ICD-10-CM | POA: Insufficient documentation

## 2016-05-11 DIAGNOSIS — Z79899 Other long term (current) drug therapy: Secondary | ICD-10-CM | POA: Insufficient documentation

## 2016-05-11 LAB — URINALYSIS, ROUTINE W REFLEX MICROSCOPIC
BILIRUBIN URINE: NEGATIVE
Glucose, UA: NEGATIVE mg/dL
HGB URINE DIPSTICK: NEGATIVE
Ketones, ur: NEGATIVE mg/dL
Leukocytes, UA: NEGATIVE
NITRITE: NEGATIVE
PROTEIN: NEGATIVE mg/dL
SPECIFIC GRAVITY, URINE: 1.015 (ref 1.005–1.030)
pH: 6 (ref 5.0–8.0)

## 2016-05-11 LAB — CBC WITH DIFFERENTIAL/PLATELET
Basophils Absolute: 0 10*3/uL (ref 0.0–0.1)
Basophils Relative: 1 %
EOS ABS: 0.1 10*3/uL (ref 0.0–0.7)
EOS PCT: 2 %
HCT: 41.3 % (ref 36.0–46.0)
HEMOGLOBIN: 13.8 g/dL (ref 12.0–15.0)
LYMPHS ABS: 0.5 10*3/uL — AB (ref 0.7–4.0)
LYMPHS PCT: 9 %
MCH: 30.3 pg (ref 26.0–34.0)
MCHC: 33.4 g/dL (ref 30.0–36.0)
MCV: 90.8 fL (ref 78.0–100.0)
Monocytes Absolute: 0.4 10*3/uL (ref 0.1–1.0)
Monocytes Relative: 7 %
NEUTROS PCT: 81 %
Neutro Abs: 4 10*3/uL (ref 1.7–7.7)
PLATELETS: 213 10*3/uL (ref 150–400)
RBC: 4.55 MIL/uL (ref 3.87–5.11)
RDW: 14.2 % (ref 11.5–15.5)
WBC: 4.9 10*3/uL (ref 4.0–10.5)

## 2016-05-11 LAB — COMPREHENSIVE METABOLIC PANEL
ALK PHOS: 87 U/L (ref 38–126)
ALT: 33 U/L (ref 14–54)
AST: 24 U/L (ref 15–41)
Albumin: 3.8 g/dL (ref 3.5–5.0)
Anion gap: 5 (ref 5–15)
BUN: 14 mg/dL (ref 6–20)
CALCIUM: 9.5 mg/dL (ref 8.9–10.3)
CHLORIDE: 111 mmol/L (ref 101–111)
CO2: 23 mmol/L (ref 22–32)
CREATININE: 0.51 mg/dL (ref 0.44–1.00)
GFR calc Af Amer: >60 mL/min (ref >60)
GFR calc non Af Amer: >60 mL/min (ref >60)
GLUCOSE: 88 mg/dL (ref 65–99)
Potassium: 4 mmol/L (ref 3.5–5.1)
SODIUM: 139 mmol/L (ref 135–145)
Total Bilirubin: 1.3 mg/dL — ABNORMAL HIGH (ref 0.3–1.2)
Total Protein: 6.5 g/dL (ref 6.5–8.1)

## 2016-05-11 MED ORDER — GADOBENATE DIMEGLUMINE 529 MG/ML IV SOLN
20.0000 mL | Freq: Once | INTRAVENOUS | Status: AC | PRN
  Administered 2016-05-11: 16 mL via INTRAVENOUS

## 2016-05-11 NOTE — ED Provider Notes (Signed)
[facility] DEPT Provider Note   CSN: [id number] Arrival date & time: 05/11/16  1325     History   Chief Complaint Chief Complaint  Patient presents with  . Aphasia    HPI Kelly Maxwell is a 71 y.o. female.   Neurologic Problem  This is a new problem. The current episode started yesterday. The problem occurs constantly. The problem has been resolved. Pertinent negatives include no chest pain and no shortness of breath. Nothing aggravates the symptoms. Nothing relieves the symptoms. She has tried nothing for the symptoms. The treatment provided no relief.    Past Medical History:  Diagnosis Date  . Cancer ([place])   . Depression   . GERD (gastroesophageal reflux disease)   . 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 Headache(784.0)   . Hiatal hernia   . History of radiation therapy 01/07/2012-02/18/12   left temporal  58GY  . HLD (hyperlipidemia)   . PONV (postoperative nausea and vomiting)   . 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 Anxiety and depression 09/03/2015  . Personal history of other infectious and parasitic diseases 09/03/2015  . 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 HLD (hyperlipidemia)   . GERD (gastroesophageal reflux disease)   . PONV (postoperative nausea and vomiting)   . 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 Hiatal hernia     Past Surgical History:  Procedure Laterality Date  . BUNIONECTOMY  2001   right  . CATARACT EXTRACTION Left   . CRANIOTOMY  12/17/2011   Procedure: CRANIOTOMY TUMOR EXCISION;  Surgeon: Otilio Connors, MD;  Location: [facility];  Service: Neurosurgery;  Laterality: Left;  LEFT  temporal craniotomy with stealth for tumor resection  . DILATION AND CURETTAGE OF UTERUS  2007  . TOTAL KNEE ARTHROPLASTY  2000   left  . VENTRAL HERNIA REPAIR N/A 09/03/2015   Procedure: LAPAROSCOPIC repair of incarcerated spigelian hernia with mesh, reduction and repair ;  Surgeon: [name], MD;  Location: [facility] ORS;  Service: General;  Laterality: N/A;    OB History    No data available       Home Medications    Prior to Admission medications   Medication Sig Start Date End Date Taking? Authorizing Provider  amphetamine-dextroamphetamine (ADDERALL) 5 MG tablet TAKE 1 TABLET BY MOUTH DAILY 04/11/16  Yes Historical Provider, MD  Bromfenac Sodium (PROLENSA) 0.07 % SOLN Place 1 drop into the right eye 3 (three) times daily. Reported on 09/02/2015   Yes Historical Provider, MD  Melatonin 1 MG CAPS Take 2 capsules by mouth at bedtime as needed (sleep).    Yes Historical Provider, MD  modafinil (PROVIGIL) 100 MG tablet Take 100 mg by mouth daily.  05/09/16  Yes Historical Provider, MD  ondansetron (ZOFRAN) 8 MG tablet Take 1 tab (8mg ) by mouth 1 hour before daily temozolomide. Take every 8 hours as needed for nausea. 03/14/16  Yes Historical Provider, MD  temozolomide (TEMODAR) 100 MG capsule Take 1 capsule by mouth (100mg ) nightly 05/09/16 06/06/16 Yes Historical Provider, MD  clonazePAM (KLONOPIN) 0.5 MG tablet Take 0.5 mg by mouth at bedtime as needed for anxiety.  Historical Provider, MD  LORazepam (ATIVAN) 0.5 MG tablet TAKE 1 TABLET 30 TO 60 MINUTES PRIOR TO MRI. MAY REPEAT ONCE FOR 1 ADDITIONAL RELIEF 03/14/16   Historical Provider, MD  tretinoin (RETIN-A) 0.05 % cream Apply 1 application topically at bedtime as needed. spots 06/24/15   Historical Provider, MD    Family History Family History  Problem Relation Age of Onset  . Heart disease Mother     Social History Social History  Substance Use Topics  . Smoking status: Never Smoker  . Smokeless tobacco: Never Used  . Alcohol use No       Allergies   Codeine; Succinylcholine chloride; and Compazine [prochlorperazine edisylate]   Review of Systems Review of Systems  Respiratory: Negative for shortness of breath.   Cardiovascular: Negative for chest pain.  Neurological: Positive for weakness.  All other systems reviewed and are negative.    Physical Exam Updated Vital Signs BP 128/77   Pulse 81   Temp 97.9 F (36.6 C) (Oral)   Resp 16   Ht 5\' 6"  (1.676 m)   Wt 175 lb (79.4 kg)   SpO2 96%   BMI 28.25 kg/m   Physical Exam  Constitutional: She is oriented to person, place, and time. She appears well-developed and well-nourished.  HENT:  Head: Normocephalic and atraumatic.  Eyes: Conjunctivae and EOM are normal.  Neck: Normal range of motion.  Cardiovascular: Normal rate and regular rhythm.   Pulmonary/Chest: No stridor. No respiratory distress.  Abdominal: She exhibits no distension.  Neurological: She is alert and oriented to person, place, and time. No cranial nerve deficit. She exhibits normal muscle tone. Coordination normal.  No altered mental status, able to give full seemingly accurate history.  Has difficulty with counting by 7's and with spelling world backwards. With history giving she does have intermittent difficulty expressing thoughts.  Face is symmetric, EOM's intact, pupils equal and reactive, vision intact, tongue and uvula midline without deviation Upper and Lower extremity motor 5/5, intact pain perception in distal extremities, 2+ reflexes in biceps, patella and achilles tendons. Finger to nose normal, heel to shin normal. Walks without assistance or evident ataxia.    Skin: Skin is warm and dry.  Nursing note and vitals reviewed.    ED Treatments / Results  Labs (all labs ordered are listed, but only abnormal results are displayed) Labs Reviewed  CBC WITH DIFFERENTIAL/PLATELET - Abnormal; Notable for the following:       Result Value   Lymphs Abs 0.5 (*)    All other  components within normal limits  COMPREHENSIVE METABOLIC PANEL - Abnormal; Notable for the following:    Total Bilirubin 1.3 (*)    All other components within normal limits  URINALYSIS, ROUTINE W REFLEX MICROSCOPIC    EKG  EKG Interpretation  Date/Time:  Friday May 11 2016 15:35:10 EST Ventricular Rate:  68 PR Interval:    QRS Duration: 91 QT Interval:  413 QTC Calculation: 440 R Axis:   30 Text Interpretation:  Sinus rhythm Low voltage, precordial leads Probable anteroseptal infarct, old Confirmed by [facility] MD, Corene Cornea [phone]) on 05/11/2016 4:11:57 PM       Radiology Mr Brain W And Wo Contrast  Result Date: 05/11/2016 CLINICAL DATA:  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 Abnormal speech beginning at 7:30 p.m. yesterday. The patient completed chemotherapy at [facility] yesterday. EXAM: MRI HEAD WITHOUT AND WITH CONTRAST TECHNIQUE: Multiplanar, multiecho pulse sequences of the brain and surrounding structures were obtained without and with  intravenous contrast. CONTRAST:  9mL MULTIHANCE GADOBENATE DIMEGLUMINE 529 MG/ML IV SOLN COMPARISON:  MRI brain 05/08/2016. FINDINGS: Brain: An acute punctate nonhemorrhagic infarct is present along the left sylvian fissure on image 27 of series 5. This is separate from the resection site. There is no associated enhancement. Diffuse asymmetric white matter changes are again noted on the left. Left temporal craniotomy and tumor resection is again seen. Minimal anterior and superior enhancement is stable. No distal enhancement is evident. The ventricles are stable in size. No significant extra-axial fluid collection is present. Vascular: Flow is present in the major intracranial arteries. Skull and upper cervical spine: The skullbase is otherwise within normal limits. Midline sagittal structures are unremarkable. Craniocervical junction is normal. Marrow signal is normal. The upper cervical spine is within normal limits. Sinuses/Orbits: Minimal mucosal thickening  is present in the ethmoid air cells, right greater than left maxillary sinus, and frontal sinuses. There is fluid in the mastoid air cells bilaterally. A left lens replacement is present. The globes and orbits are otherwise within normal limits. IMPRESSION: 1. 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 This is separate from the resection cavity. 2. Stable postoperative changes in the left temporal lobe with minimal residual enhancement. 3. Stable diffuse white matter disease, left greater than right. Electronically Signed   By: San Morelle M.D.   On: 05/11/2016 18:08    Procedures Procedures (including critical care time)  Medications Ordered in ED Medications  gadobenate dimeglumine (MULTIHANCE) injection 20 mL (16 mLs Intravenous Contrast Given 05/11/16 1725)     Initial Impression / Assessment and Plan / ED Course  I have reviewed the triage vital signs and the nursing notes.  Pertinent labs & imaging results that were available during my care of the patient were reviewed by me and considered in my medical decision making (see chart for details).     Here with a brief episode of aphasia last night. MRI with small punctate area of possible infarction new from Tuesday. Discussed with neurology, Dr. Cheral Marker, on the phone who states he thought it was artifact. He thought it was more likely that her symptoms were related to the tumor in the same area. As patient was completely asymptomatic now He thought she was ok to be discharged and follow up with her physicians at [facility]. Discussed with patient and family and they understood and agreed with plan. Discharged asymptomatic and in stable condition.   Final Clinical Impressions(s) / ED Diagnoses   Final diagnoses:  Aphasia      Merrily Pew, MD 05/12/16 1207

## 2016-05-11 NOTE — ED Triage Notes (Signed)
Patient has had trouble speaking since last night at about 19:30. Patient has a brain tumor and treated at [facility] (patient last had chemo last night). Patient has had incontinence x3 months and getting progressively worse.

## 2016-05-11 NOTE — ED Notes (Signed)
Patient transported to MRI 

## 2016-05-17 DIAGNOSIS — H1859 Other hereditary corneal dystrophies: Secondary | ICD-10-CM | POA: Diagnosis not present

## 2016-06-15 DIAGNOSIS — C718 Malignant neoplasm of overlapping sites of brain: Secondary | ICD-10-CM | POA: Diagnosis not present

## 2016-06-15 DIAGNOSIS — C719 Malignant neoplasm of brain, unspecified: Secondary | ICD-10-CM | POA: Diagnosis not present

## 2016-06-25 ENCOUNTER — Other Ambulatory Visit: Payer: Self-pay | Admitting: Physician Assistant

## 2016-06-25 DIAGNOSIS — C719 Malignant neoplasm of brain, unspecified: Secondary | ICD-10-CM

## 2016-06-27 DIAGNOSIS — H2511 Age-related nuclear cataract, right eye: Secondary | ICD-10-CM | POA: Diagnosis not present

## 2016-06-27 DIAGNOSIS — H35352 Cystoid macular degeneration, left eye: Secondary | ICD-10-CM | POA: Diagnosis not present

## 2016-06-27 DIAGNOSIS — Z961 Presence of intraocular lens: Secondary | ICD-10-CM | POA: Diagnosis not present

## 2016-06-27 DIAGNOSIS — H16223 Keratoconjunctivitis sicca, not specified as Sjogren's, bilateral: Secondary | ICD-10-CM | POA: Diagnosis not present

## 2016-06-28 ENCOUNTER — Encounter (INDEPENDENT_AMBULATORY_CARE_PROVIDER_SITE_OTHER): Payer: Medicare HMO | Admitting: Ophthalmology

## 2016-06-28 DIAGNOSIS — H59032 Cystoid macular edema following cataract surgery, left eye: Secondary | ICD-10-CM

## 2016-06-28 DIAGNOSIS — H43813 Vitreous degeneration, bilateral: Secondary | ICD-10-CM | POA: Diagnosis not present

## 2016-06-28 DIAGNOSIS — D3132 Benign neoplasm of left choroid: Secondary | ICD-10-CM

## 2016-06-28 DIAGNOSIS — H2511 Age-related nuclear cataract, right eye: Secondary | ICD-10-CM | POA: Diagnosis not present

## 2016-07-05 ENCOUNTER — Ambulatory Visit
Admission: RE | Admit: 2016-07-05 | Discharge: 2016-07-05 | Disposition: A | Discharge status: Home / Self Care | Payer: Medicare HMO | Source: Ambulatory Visit | Attending: Physician Assistant | Admitting: Physician Assistant

## 2016-07-05 DIAGNOSIS — C719 Malignant neoplasm of brain, unspecified: Secondary | ICD-10-CM

## 2016-07-05 DIAGNOSIS — C718 Malignant neoplasm of overlapping sites of brain: Secondary | ICD-10-CM | POA: Diagnosis not present

## 2016-08-09 ENCOUNTER — Encounter (INDEPENDENT_AMBULATORY_CARE_PROVIDER_SITE_OTHER): Payer: Medicare HMO | Admitting: Ophthalmology

## 2016-08-10 ENCOUNTER — Encounter (INDEPENDENT_AMBULATORY_CARE_PROVIDER_SITE_OTHER): Payer: Medicare HMO | Admitting: Ophthalmology

## 2016-08-10 DIAGNOSIS — H26492 Other secondary cataract, left eye: Secondary | ICD-10-CM

## 2016-08-10 DIAGNOSIS — H43813 Vitreous degeneration, bilateral: Secondary | ICD-10-CM | POA: Diagnosis not present

## 2016-08-10 DIAGNOSIS — H59032 Cystoid macular edema following cataract surgery, left eye: Secondary | ICD-10-CM | POA: Diagnosis not present

## 2016-08-10 DIAGNOSIS — D3132 Benign neoplasm of left choroid: Secondary | ICD-10-CM | POA: Diagnosis not present

## 2016-08-13 DIAGNOSIS — C719 Malignant neoplasm of brain, unspecified: Secondary | ICD-10-CM | POA: Diagnosis not present

## 2016-08-15 ENCOUNTER — Other Ambulatory Visit: Payer: Self-pay | Admitting: Internal Medicine

## 2016-08-15 DIAGNOSIS — L729 Follicular cyst of the skin and subcutaneous tissue, unspecified: Secondary | ICD-10-CM | POA: Diagnosis not present

## 2016-08-15 DIAGNOSIS — C719 Malignant neoplasm of brain, unspecified: Secondary | ICD-10-CM | POA: Diagnosis not present

## 2016-08-15 DIAGNOSIS — Z1231 Encounter for screening mammogram for malignant neoplasm of breast: Secondary | ICD-10-CM

## 2016-08-15 DIAGNOSIS — M810 Age-related osteoporosis without current pathological fracture: Secondary | ICD-10-CM | POA: Diagnosis not present

## 2016-08-15 DIAGNOSIS — R3915 Urgency of urination: Secondary | ICD-10-CM | POA: Diagnosis not present

## 2016-08-15 DIAGNOSIS — R2231 Localized swelling, mass and lump, right upper limb: Secondary | ICD-10-CM | POA: Diagnosis not present

## 2016-08-27 ENCOUNTER — Ambulatory Visit: Payer: Medicare HMO

## 2016-08-30 ENCOUNTER — Encounter (INDEPENDENT_AMBULATORY_CARE_PROVIDER_SITE_OTHER): Payer: Medicare HMO | Admitting: Ophthalmology

## 2016-08-30 DIAGNOSIS — H2702 Aphakia, left eye: Secondary | ICD-10-CM

## 2016-09-04 ENCOUNTER — Other Ambulatory Visit: Payer: Self-pay | Admitting: Acute Care

## 2016-09-04 DIAGNOSIS — C719 Malignant neoplasm of brain, unspecified: Secondary | ICD-10-CM

## 2016-09-07 ENCOUNTER — Ambulatory Visit
Admission: RE | Admit: 2016-09-07 | Discharge: 2016-09-07 | Disposition: A | Discharge status: Home / Self Care | Payer: Medicare HMO | Source: Ambulatory Visit | Attending: Acute Care | Admitting: Acute Care

## 2016-09-07 DIAGNOSIS — C719 Malignant neoplasm of brain, unspecified: Secondary | ICD-10-CM | POA: Diagnosis not present

## 2016-09-07 MED ORDER — GADOBENATE DIMEGLUMINE 529 MG/ML IV SOLN
15.0000 mL | Freq: Once | INTRAVENOUS | Status: AC | PRN
  Administered 2016-09-07: 15 mL via INTRAVENOUS

## 2016-09-13 DIAGNOSIS — C719 Malignant neoplasm of brain, unspecified: Secondary | ICD-10-CM | POA: Diagnosis not present

## 2016-09-13 DIAGNOSIS — C718 Malignant neoplasm of overlapping sites of brain: Secondary | ICD-10-CM | POA: Diagnosis not present

## 2016-09-21 ENCOUNTER — Other Ambulatory Visit: Payer: Self-pay | Admitting: Neurology

## 2016-09-21 DIAGNOSIS — C719 Malignant neoplasm of brain, unspecified: Secondary | ICD-10-CM

## 2016-11-01 ENCOUNTER — Encounter (INDEPENDENT_AMBULATORY_CARE_PROVIDER_SITE_OTHER): Payer: Medicare HMO | Admitting: Ophthalmology

## 2016-11-06 ENCOUNTER — Ambulatory Visit
Admission: RE | Admit: 2016-11-06 | Discharge: 2016-11-06 | Disposition: A | Discharge status: Home / Self Care | Payer: Medicare HMO | Source: Ambulatory Visit | Attending: Neurology | Admitting: Neurology

## 2016-11-06 DIAGNOSIS — C719 Malignant neoplasm of brain, unspecified: Secondary | ICD-10-CM | POA: Diagnosis not present

## 2016-11-06 MED ORDER — GADOBENATE DIMEGLUMINE 529 MG/ML IV SOLN
15.0000 mL | Freq: Once | INTRAVENOUS | Status: AC | PRN
  Administered 2016-11-06: 15 mL via INTRAVENOUS

## 2016-11-08 DIAGNOSIS — C719 Malignant neoplasm of brain, unspecified: Secondary | ICD-10-CM | POA: Diagnosis not present

## 2016-11-08 DIAGNOSIS — C718 Malignant neoplasm of overlapping sites of brain: Secondary | ICD-10-CM | POA: Diagnosis not present

## 2016-11-15 ENCOUNTER — Encounter (INDEPENDENT_AMBULATORY_CARE_PROVIDER_SITE_OTHER): Payer: Medicare HMO | Admitting: Ophthalmology

## 2016-12-03 ENCOUNTER — Encounter (INDEPENDENT_AMBULATORY_CARE_PROVIDER_SITE_OTHER): Payer: Medicare HMO | Admitting: Ophthalmology

## 2016-12-03 DIAGNOSIS — H59032 Cystoid macular edema following cataract surgery, left eye: Secondary | ICD-10-CM | POA: Diagnosis not present

## 2016-12-03 DIAGNOSIS — H43813 Vitreous degeneration, bilateral: Secondary | ICD-10-CM | POA: Diagnosis not present

## 2016-12-03 DIAGNOSIS — D3132 Benign neoplasm of left choroid: Secondary | ICD-10-CM | POA: Diagnosis not present

## 2017-01-07 ENCOUNTER — Other Ambulatory Visit: Payer: Self-pay | Admitting: Nurse Practitioner

## 2017-01-07 DIAGNOSIS — C719 Malignant neoplasm of brain, unspecified: Secondary | ICD-10-CM

## 2017-01-09 ENCOUNTER — Ambulatory Visit
Admission: RE | Admit: 2017-01-09 | Discharge: 2017-01-09 | Disposition: A | Discharge status: Home / Self Care | Payer: Medicare HMO | Source: Ambulatory Visit | Attending: Nurse Practitioner | Admitting: Nurse Practitioner

## 2017-01-09 ENCOUNTER — Other Ambulatory Visit: Payer: Medicare HMO

## 2017-01-09 ENCOUNTER — Inpatient Hospital Stay: Admission: RE | Admit: 2017-01-09 | Payer: Medicare HMO | Source: Ambulatory Visit

## 2017-01-09 DIAGNOSIS — C719 Malignant neoplasm of brain, unspecified: Secondary | ICD-10-CM

## 2017-01-09 DIAGNOSIS — R2689 Other abnormalities of gait and mobility: Secondary | ICD-10-CM | POA: Diagnosis not present

## 2017-01-09 MED ORDER — GADOBENATE DIMEGLUMINE 529 MG/ML IV SOLN
15.0000 mL | Freq: Once | INTRAVENOUS | Status: AC | PRN
  Administered 2017-01-09: 15 mL via INTRAVENOUS

## 2017-01-10 DIAGNOSIS — C718 Malignant neoplasm of overlapping sites of brain: Secondary | ICD-10-CM | POA: Diagnosis not present

## 2017-01-10 DIAGNOSIS — R5383 Other fatigue: Secondary | ICD-10-CM | POA: Diagnosis not present

## 2017-01-10 DIAGNOSIS — C719 Malignant neoplasm of brain, unspecified: Secondary | ICD-10-CM | POA: Diagnosis not present

## 2017-01-23 ENCOUNTER — Other Ambulatory Visit: Payer: Self-pay | Admitting: Nurse Practitioner

## 2017-01-23 DIAGNOSIS — C719 Malignant neoplasm of brain, unspecified: Secondary | ICD-10-CM

## 2017-01-29 DIAGNOSIS — R350 Frequency of micturition: Secondary | ICD-10-CM | POA: Diagnosis not present

## 2017-01-29 DIAGNOSIS — N3946 Mixed incontinence: Secondary | ICD-10-CM | POA: Diagnosis not present

## 2017-01-29 DIAGNOSIS — R351 Nocturia: Secondary | ICD-10-CM | POA: Diagnosis not present

## 2017-01-29 DIAGNOSIS — N3944 Nocturnal enuresis: Secondary | ICD-10-CM | POA: Diagnosis not present

## 2017-02-06 DIAGNOSIS — R69 Illness, unspecified: Secondary | ICD-10-CM | POA: Diagnosis not present

## 2017-02-08 ENCOUNTER — Other Ambulatory Visit: Payer: Medicare HMO

## 2017-02-13 ENCOUNTER — Encounter (INDEPENDENT_AMBULATORY_CARE_PROVIDER_SITE_OTHER): Payer: Medicare HMO | Admitting: Ophthalmology

## 2017-03-11 ENCOUNTER — Ambulatory Visit
Admission: RE | Admit: 2017-03-11 | Discharge: 2017-03-11 | Disposition: A | Discharge status: Home / Self Care | Payer: Medicare HMO | Source: Ambulatory Visit | Attending: Nurse Practitioner | Admitting: Nurse Practitioner

## 2017-03-11 DIAGNOSIS — C719 Malignant neoplasm of brain, unspecified: Secondary | ICD-10-CM

## 2017-03-11 MED ORDER — GADOBENATE DIMEGLUMINE 529 MG/ML IV SOLN
15.0000 mL | Freq: Once | INTRAVENOUS | Status: AC | PRN
  Administered 2017-03-11: 15 mL via INTRAVENOUS

## 2017-03-14 DIAGNOSIS — R5383 Other fatigue: Secondary | ICD-10-CM | POA: Diagnosis not present

## 2017-03-14 DIAGNOSIS — C718 Malignant neoplasm of overlapping sites of brain: Secondary | ICD-10-CM | POA: Diagnosis not present

## 2017-03-14 DIAGNOSIS — Z9889 Other specified postprocedural states: Secondary | ICD-10-CM | POA: Diagnosis not present

## 2017-03-14 DIAGNOSIS — C712 Malignant neoplasm of temporal lobe: Secondary | ICD-10-CM | POA: Diagnosis not present

## 2017-03-15 ENCOUNTER — Other Ambulatory Visit: Payer: Self-pay | Admitting: Adult Health Nurse Practitioner

## 2017-03-15 DIAGNOSIS — C719 Malignant neoplasm of brain, unspecified: Secondary | ICD-10-CM

## 2017-03-18 ENCOUNTER — Encounter (INDEPENDENT_AMBULATORY_CARE_PROVIDER_SITE_OTHER): Payer: Medicare HMO | Admitting: Ophthalmology

## 2017-03-18 DIAGNOSIS — H59032 Cystoid macular edema following cataract surgery, left eye: Secondary | ICD-10-CM | POA: Diagnosis not present

## 2017-03-18 DIAGNOSIS — D3132 Benign neoplasm of left choroid: Secondary | ICD-10-CM

## 2017-03-18 DIAGNOSIS — H43813 Vitreous degeneration, bilateral: Secondary | ICD-10-CM

## 2017-04-02 DIAGNOSIS — R351 Nocturia: Secondary | ICD-10-CM | POA: Diagnosis not present

## 2017-04-02 DIAGNOSIS — N3946 Mixed incontinence: Secondary | ICD-10-CM | POA: Diagnosis not present

## 2017-04-02 DIAGNOSIS — R350 Frequency of micturition: Secondary | ICD-10-CM | POA: Diagnosis not present

## 2017-04-08 ENCOUNTER — Inpatient Hospital Stay
Admission: RE | Admit: 2017-04-08 | Discharge: 2017-04-08 | Disposition: A | Discharge status: Home / Self Care | Payer: Medicare HMO | Source: Ambulatory Visit | Attending: Adult Health Nurse Practitioner | Admitting: Adult Health Nurse Practitioner

## 2017-04-09 ENCOUNTER — Inpatient Hospital Stay
Admission: RE | Admit: 2017-04-09 | Discharge: 2017-04-09 | Disposition: A | Discharge status: Home / Self Care | Payer: Medicare HMO | Source: Ambulatory Visit | Attending: Adult Health Nurse Practitioner | Admitting: Adult Health Nurse Practitioner

## 2017-04-10 ENCOUNTER — Other Ambulatory Visit: Payer: Self-pay | Admitting: Adult Health Nurse Practitioner

## 2017-04-10 DIAGNOSIS — C719 Malignant neoplasm of brain, unspecified: Secondary | ICD-10-CM

## 2017-04-11 ENCOUNTER — Inpatient Hospital Stay: Admission: RE | Admit: 2017-04-11 | Payer: Medicare HMO | Source: Ambulatory Visit

## 2017-04-16 ENCOUNTER — Other Ambulatory Visit: Payer: Medicare HMO

## 2017-04-17 ENCOUNTER — Ambulatory Visit
Admission: RE | Admit: 2017-04-17 | Discharge: 2017-04-17 | Disposition: A | Discharge status: Home / Self Care | Payer: Medicare HMO | Source: Ambulatory Visit | Attending: Adult Health Nurse Practitioner | Admitting: Adult Health Nurse Practitioner

## 2017-04-17 DIAGNOSIS — C719 Malignant neoplasm of brain, unspecified: Secondary | ICD-10-CM

## 2017-04-17 MED ORDER — GADOBENATE DIMEGLUMINE 529 MG/ML IV SOLN
14.0000 mL | Freq: Once | INTRAVENOUS | Status: AC | PRN
  Administered 2017-04-17: 14 mL via INTRAVENOUS

## 2017-04-18 ENCOUNTER — Other Ambulatory Visit: Payer: Medicare HMO

## 2017-04-18 DIAGNOSIS — C719 Malignant neoplasm of brain, unspecified: Secondary | ICD-10-CM | POA: Diagnosis not present

## 2017-04-18 DIAGNOSIS — C718 Malignant neoplasm of overlapping sites of brain: Secondary | ICD-10-CM | POA: Diagnosis not present

## 2017-04-22 ENCOUNTER — Other Ambulatory Visit: Payer: Self-pay | Admitting: *Deleted

## 2017-04-23 ENCOUNTER — Inpatient Hospital Stay: Payer: Medicare HMO | Attending: Internal Medicine | Admitting: Internal Medicine

## 2017-04-23 ENCOUNTER — Other Ambulatory Visit: Payer: Self-pay

## 2017-04-23 ENCOUNTER — Telehealth: Payer: Self-pay | Admitting: Internal Medicine

## 2017-04-23 VITALS — BP 155/96 | HR 103 | Temp 98.7°F | Resp 18 | Ht 66.0 in | Wt 153.6 lb

## 2017-04-23 DIAGNOSIS — E785 Hyperlipidemia, unspecified: Secondary | ICD-10-CM | POA: Insufficient documentation

## 2017-04-23 DIAGNOSIS — Z5112 Encounter for antineoplastic immunotherapy: Secondary | ICD-10-CM | POA: Insufficient documentation

## 2017-04-23 DIAGNOSIS — Z888 Allergy status to other drugs, medicaments and biological substances status: Secondary | ICD-10-CM

## 2017-04-23 DIAGNOSIS — Q043 Other reduction deformities of brain: Secondary | ICD-10-CM

## 2017-04-23 DIAGNOSIS — Z9221 Personal history of antineoplastic chemotherapy: Secondary | ICD-10-CM | POA: Insufficient documentation

## 2017-04-23 DIAGNOSIS — Z79899 Other long term (current) drug therapy: Secondary | ICD-10-CM | POA: Diagnosis not present

## 2017-04-23 DIAGNOSIS — K219 Gastro-esophageal reflux disease without esophagitis: Secondary | ICD-10-CM | POA: Insufficient documentation

## 2017-04-23 DIAGNOSIS — Z885 Allergy status to narcotic agent status: Secondary | ICD-10-CM | POA: Insufficient documentation

## 2017-04-23 DIAGNOSIS — Z923 Personal history of irradiation: Secondary | ICD-10-CM | POA: Diagnosis not present

## 2017-04-23 DIAGNOSIS — R2689 Other abnormalities of gait and mobility: Secondary | ICD-10-CM

## 2017-04-23 DIAGNOSIS — C719 Malignant neoplasm of brain, unspecified: Secondary | ICD-10-CM | POA: Diagnosis not present

## 2017-04-23 NOTE — Progress Notes (Signed)
[facility] [place], [place] [address] [phone]   New Patient Evaluation  Date of Service: 04/23/17 Patient Name: Kelly Maxwell Patient MRN: [id number] Patient DOB: 13-Jan-1946 Provider: Ventura Sellers, MD  Identifying Statement:  Kelly Maxwell is a 72 y.o. female with left temporal glioblastoma who presents for initial consultation and evaluation.    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  Path demonstrates Gliosarcoma       - 02/18/2012 Radiation Therapy    Completes RT and concurrent Temodar with Dr. Tammi Klippel       - 09/14/2013 Chemotherapy    Completes 18 cycles of adjuvant Temodar, treated at [facility].       09/29/2015 Progression    Progression #1.  Starts metronomic temodar 54m/m2 daily       - 09/26/2016 Chemotherapy    Completes 1 year metronomic temodar       04/19/2017 Progression    Progression #2.  Recommendation for Avastin 172mkg q2 weeks       Biomarkers:  MGMT Unknown.  IDH 1/2 Unknown.  EGFR Unknown  TERT Unknown   History of Present Illness: The patient's records from the referring physician were obtained and reviewed and the patient interviewed to confirm this HPI.  Kelly Maxwell today as a referral for local oncologic care and systemic therapy administration from her primary neuro-oncologist at [facility]   Kelly Maxwell identified with radiographic progression at most recent MRI scan.  She has not been on anti-tumor therapy since completing one year of daily metronomic temodar this past July.  There have been no clinical changes suggestive of progression- she still has gait impairment  requiring occassional use of a cane, chronic left eye vision impairment, and chronic short term memory impairment.  These deficits are all stable.  She is not currently on any AED or steroid.  She would like to obtain her Avastin treatments locally through this center.  Medications: Current Outpatient Medications on File Prior to Visit  Medication Sig Dispense Refill  . amphetamine-dextroamphetamine (ADDERALL) 5 MG tablet TAKE 1 TABLET BY MOUTH DAILY  0  . Bromfenac Sodium (PROLENSA) 0.07 % SOLN Place 1 drop into the right eye 3 (three) times daily. Reported on 09/02/2015    . clonazePAM (KLONOPIN) 0.5 MG tablet Take 0.5 mg by mouth at bedtime as needed for anxiety.    . Marland KitchenORazepam (ATIVAN) 0.5 MG tablet TAKE 1 TABLET 30 TO 60 MINUTES PRIOR TO MRI. MAY REPEAT ONCE FOR 1 ADDITIONAL RELIEF  0  . Melatonin 1 MG CAPS Take 2 capsules by mouth at bedtime as needed (sleep).     . modafinil (PROVIGIL) 100 MG tablet Take 100 mg by mouth daily.     . ondansetron (ZOFRAN) 8 MG tablet Take 1 tab (40m42mby mouth 1 hour before daily temozolomide. Take every 8 hours as needed for nausea.    . tMarland Kitchenetinoin (RETIN-A) 0.05 % cream Apply 1 application topically at bedtime as needed. spots  0   No current facility-administered medications on file prior to visit.     Allergies:  Allergies  Allergen  Reactions  . Codeine Nausea And Vomiting and Nausea Only  . 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    . Compazine [Prochlorperazine Edisylate] Other (See Comments) and Rash    Became hyper Became hyper   Past Medical History:  Past Medical History:  Diagnosis Date  . Cancer ([facility])   . Depression   . GERD (gastroesophageal reflux disease)   . Glial neoplasm of brain ([facility]) 12/17/11   left temporal, grade IV, 2.7 x 3.6 x 2.5 cm ring enhancing; numerous small surrounding satellite lesions 12/13/11  . Headache(784.0)   . Hiatal hernia   .  History of radiation therapy 01/07/2012-02/18/12   left temporal  58GY  . HLD (hyperlipidemia)   . PONV (postoperative nausea and vomiting)   . 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 BUNIONECTOMY  2001   right  . CATARACT EXTRACTION Left   . CRANIOTOMY  12/17/2011   Procedure: CRANIOTOMY TUMOR EXCISION;  Surgeon: Otilio Connors, MD;  Location: [facility];  Service: Neurosurgery;  Laterality: Left;  LEFT temporal craniotomy with stealth for tumor resection  . DILATION AND CURETTAGE OF UTERUS  2007  . TOTAL KNEE ARTHROPLASTY  2000   left  . VENTRAL HERNIA REPAIR N/A 09/03/2015   Procedure: LAPAROSCOPIC repair of incarcerated spigelian hernia with mesh, reduction and repair ;  Surgeon: [name], MD;  Location: [facility];  Service: General;  Laterality: N/A;   Social History:  Social History   Socioeconomic History  . Marital status: Married    Spouse name: Not on file  . Number of children: 3  . Years of education: Not on file  . Highest education level: Not on file  Social Needs  . Financial resource strain: Not on file  . Food insecurity - worry: Not on file  . Food insecurity - inability: Not on file  . Transportation needs - medical: Not on file  . Transportation needs - non-medical: Not on file  Occupational History  . Not on file  Tobacco Use  . Smoking status: Never Smoker  . Smokeless tobacco: Never Used  Substance and Sexual Activity  . Alcohol use: No  . Drug use: No  . Sexual activity: Yes  Other Topics Concern  . Not on file  Social History Narrative   2nd marriage of 6 years, previously widowed   Family History:  Family History  Problem Relation Age of Onset  . Heart disease Mother     Review of Systems: Constitutional: Denies fevers, chills or abnormal weight loss Eyes: Denies blurriness of  vision Ears, nose, mouth, throat, and face: Denies mucositis or sore throat Respiratory: Denies cough, dyspnea or wheezes Cardiovascular: Denies palpitation, chest discomfort or lower extremity swelling Gastrointestinal:  Denies nausea, constipation, diarrhea GU: Denies dysuria or incontinence Skin: Denies abnormal skin rashes Neurological: Per HPI Musculoskeletal: Denies joint pain, back or neck discomfort. No decrease in ROM Behavioral/Psych: Denies anxiety, disturbance in thought content, and mood instability  Physical Exam: Vitals:   04/23/17 1125  BP: (!) 155/96  Pulse: (!) 103  Resp: 18  Temp: 98.7 F (37.1 C)  SpO2: 97%   KPS: 80. General: Alert, cooperative, pleasant, in no acute distress Head: Craniotomy scar noted, dry and intact. EENT: No conjunctival injection or scleral icterus. Oral mucosa moist Lungs: Resp effort normal  Cardiac: Regular rate and rhythm Abdomen: Soft, non-distended abdomen Skin: No rashes cyanosis or petechiae. Extremities: No clubbing or edema  Neurologic Exam: Mental Status: Awake, alert, attentive to examiner. Oriented to self and environment. Language is fluent with intact comprehension.  Age advanced psychomotor slowing with impaired short term memory. Cranial Nerves: Visual acuity is grossly normal. Visual fields are full. Extra-ocular movements intact. Left eye ptosis, chronic. Face is symmetric, tongue midline. Motor: Tone and bulk are normal. Power is full in both arms and legs. Reflexes are symmetric, no pathologic reflexes present. Intact finger to nose bilaterally Sensory: Intact to light touch and temperature Gait: Slow, deliberate.  Unable to tandem   Labs: I have reviewed the data as listed    Component Value Date/Time   NA 139 05/11/2016 1541   NA 140 09/11/2013 1238   K 4.0 05/11/2016 1541   K 4.7 09/11/2013 1238   CL 111 05/11/2016 1541   CL 111 (H) 08/04/2012 1008   CO2 23 05/11/2016 1541   CO2 23 09/11/2013 1238    GLUCOSE 88 05/11/2016 1541   GLUCOSE 87 09/11/2013 1238   GLUCOSE 86 08/04/2012 1008   BUN 14 05/11/2016 1541   BUN 13.3 10/29/2014 1127   CREATININE 0.51 05/11/2016 1541   CREATININE 0.8 10/29/2014 1127   CALCIUM 9.5 05/11/2016 1541   CALCIUM 9.5 09/11/2013 1238   PROT 6.5 05/11/2016 1541   PROT 6.6 09/11/2013 1238   ALBUMIN 3.8 05/11/2016 1541   ALBUMIN 3.9 09/11/2013 1238   AST 24 05/11/2016 1541   AST 19 09/11/2013 1238   ALT 33 05/11/2016 1541   ALT 27 09/11/2013 1238   ALKPHOS 87 05/11/2016 1541   ALKPHOS 106 09/11/2013 1238   BILITOT 1.3 (H) 05/11/2016 1541   BILITOT 0.72 09/11/2013 1238   GFRNONAA >60 05/11/2016 1541   GFRAA >60 05/11/2016 1541   Lab Results  Component Value Date   WBC 4.9 05/11/2016   NEUTROABS 4.0 05/11/2016   HGB 13.8 05/11/2016   HCT 41.3 05/11/2016   MCV 90.8 05/11/2016   PLT 213 05/11/2016    Imaging: [facility] Clinician Interpretation: I have personally reviewed the CNS images as listed.  My interpretation, in the context of the patient's clinical presentation, is progressive disease  Mr Jeri Cos Wo Contrast  Result Date: 04/17/2017 CLINICAL DATA:  Glioblastoma, currently on chemotherapy. History of resection in 2013. EXAM: MRI HEAD WITHOUT AND WITH CONTRAST TECHNIQUE: Multiplanar, multiecho pulse sequences of the brain and surrounding structures were obtained without and with intravenous contrast. CONTRAST:  14 mL gadobenate dimeglumine (MULTIHANCE) injection COMPARISON:  Brain MRI 03/11/2017 FINDINGS: Brain: Large area of hyperintense T2-weighted signal within the left frontal, parietal and temporal lobes unchanged. Signal abnormality extends across the genu and splenium of the corpus callosum with unchanged involvement of the right frontal white matter. Contrast-enhancing mass is increased in size and now measures 2.7 x 1.6 x 1.7 cm. There is a new focus of contrast enhancement adjacent to the left caudate nucleus that measures 8 x 5 mm. Punctate  focus of contrast enhancement in the medial left temporal lobe is unchanged. There is a new focus of minimal contrast enhancement within the superior left temporal lobe, above the dominant mass. No abnormal contrast enhancement in the contralateral hemisphere. The midline structures are normal. There is no acute infarct or acute hemorrhage. No age-advanced or lobar predominant atrophy. No chronic microhemorrhage or superficial siderosis. Vascular: Major intracranial arterial and venous sinus flow voids are preserved. Left frontal DVA.  Skull and upper cervical spine: Remote left pterional craniotomy. Sinuses/Orbits: No fluid levels or advanced mucosal thickening. No mastoid or middle ear effusion. Normal orbits. IMPRESSION: Progression of disease with increased size of dominant mass in the left temporal lobe and new contrast-enhancing lesion at the left basal ganglia. Unchanged extent of nonenhancing tumor. Electronically Signed   By: Ulyses Jarred M.D.   On: 04/17/2017 20:51    Pathology:  Assessment/Plan 1. 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  We agree with Dr. Durenda Age recommendation to initiate Avastin 21m/kg q2 weeks.    Today we counseled her on the potential adverse effects, including elevated blood pressure, poor wound healing, and increased risk of bleeding or thrombotic events.    Avastin should be held if: -Platelets less than 50,000 -ANC less than 500 -LFTs or creatinine >2x ULN -If clinical concerns/contraindications develop  We encouraged her to seek out physical/occupational therapy for her gait and cognitive deficits.  We appreciate the opportunity to participate in the care of Kelly Maxwell   [facility]for treatment plan decisions.  We will be in open communication with her office regarding any concerns or changes to current plan of care.   All questions were answered. The  patient knows to call the clinic with any problems, questions or concerns. No barriers to learning were detected.  The total time spent in the encounter was 60 minutes and more than 50% was on counseling and review of test results   ZVentura Sellers MD Medical Director of Neuro-Oncology [facility] 11:10 AM

## 2017-04-23 NOTE — Telephone Encounter (Signed)
Scheduled appt per 2/19 sch message - Rn to contact patient.

## 2017-04-23 NOTE — Progress Notes (Signed)
START ON PATHWAY REGIMEN - Neuro     A cycle is every 14 days:     Bevacizumab   **Always confirm dose/schedule in your pharmacy ordering system**    Patient Characteristics: Glioblastoma, Anaplastic Astrocytoma, and Anaplastic Oligodendroglioma, Recurrent or Progressive, Nonsurgical Candidate, Systemic Therapy Candidate Disease Status: Recurrent or Progressive Disease Classification: Glioblastoma Treatment Classification: Nonsurgical Candidate Treatment (Nonsurgical/Adjuvant): Systemic Therapy Candidate Would you be surprised if this patient died  in the next year<= I would be surprised if this patient died in the next year Intent of Therapy: Non-Curative / Palliative Intent, Discussed with Patient

## 2017-04-24 ENCOUNTER — Telehealth: Payer: Self-pay

## 2017-04-24 NOTE — Telephone Encounter (Signed)
No los per 2/19. °

## 2017-04-26 ENCOUNTER — Inpatient Hospital Stay: Payer: Medicare HMO

## 2017-04-26 ENCOUNTER — Other Ambulatory Visit: Payer: Self-pay

## 2017-04-26 ENCOUNTER — Other Ambulatory Visit: Payer: Self-pay | Admitting: Internal Medicine

## 2017-04-26 ENCOUNTER — Encounter: Payer: Self-pay | Admitting: Internal Medicine

## 2017-04-26 ENCOUNTER — Inpatient Hospital Stay (HOSPITAL_BASED_OUTPATIENT_CLINIC_OR_DEPARTMENT_OTHER): Payer: Medicare HMO | Admitting: Internal Medicine

## 2017-04-26 VITALS — BP 134/75 | HR 109 | Temp 98.0°F | Resp 18 | Wt 151.7 lb

## 2017-04-26 DIAGNOSIS — Z885 Allergy status to narcotic agent status: Secondary | ICD-10-CM | POA: Diagnosis not present

## 2017-04-26 DIAGNOSIS — Q043 Other reduction deformities of brain: Secondary | ICD-10-CM | POA: Diagnosis not present

## 2017-04-26 DIAGNOSIS — C719 Malignant neoplasm of brain, unspecified: Secondary | ICD-10-CM

## 2017-04-26 DIAGNOSIS — Z5112 Encounter for antineoplastic immunotherapy: Secondary | ICD-10-CM | POA: Diagnosis not present

## 2017-04-26 DIAGNOSIS — Z923 Personal history of irradiation: Secondary | ICD-10-CM | POA: Diagnosis not present

## 2017-04-26 DIAGNOSIS — K219 Gastro-esophageal reflux disease without esophagitis: Secondary | ICD-10-CM

## 2017-04-26 DIAGNOSIS — Z888 Allergy status to other drugs, medicaments and biological substances status: Secondary | ICD-10-CM | POA: Diagnosis not present

## 2017-04-26 DIAGNOSIS — E785 Hyperlipidemia, unspecified: Secondary | ICD-10-CM

## 2017-04-26 DIAGNOSIS — Z9221 Personal history of antineoplastic chemotherapy: Secondary | ICD-10-CM

## 2017-04-26 DIAGNOSIS — Z79899 Other long term (current) drug therapy: Secondary | ICD-10-CM

## 2017-04-26 LAB — CMP (CANCER CENTER ONLY)
ALBUMIN: 3.9 g/dL (ref 3.5–5.0)
ALT: 29 U/L (ref 0–55)
AST: 22 U/L (ref 5–34)
Alkaline Phosphatase: 101 U/L (ref 40–150)
Anion gap: 9 (ref 3–11)
BILIRUBIN TOTAL: 0.9 mg/dL (ref 0.2–1.2)
BUN: 13 mg/dL (ref 7–26)
CHLORIDE: 108 mmol/L (ref 98–109)
CO2: 24 mmol/L (ref 22–29)
CREATININE: 0.7 mg/dL (ref 0.60–1.10)
Calcium: 9.9 mg/dL (ref 8.4–10.4)
GFR, Est AFR Am: >60 mL/min (ref >60)
GLUCOSE: 90 mg/dL (ref 70–140)
Potassium: 3.7 mmol/L (ref 3.5–5.1)
Sodium: 141 mmol/L (ref 136–145)
Total Protein: 7 g/dL (ref 6.4–8.3)

## 2017-04-26 LAB — CBC WITH DIFFERENTIAL (CANCER CENTER ONLY)
BASOS ABS: 0.1 10*3/uL (ref 0.0–0.1)
BASOS PCT: 1 %
Eosinophils Absolute: 0.1 10*3/uL (ref 0.0–0.5)
Eosinophils Relative: 2 %
HEMATOCRIT: 46.9 % — AB (ref 34.8–46.6)
Hemoglobin: 15.8 g/dL (ref 11.6–15.9)
LYMPHS PCT: 8 %
Lymphs Abs: 0.5 10*3/uL — ABNORMAL LOW (ref 0.9–3.3)
MCH: 30.9 pg (ref 25.1–34.0)
MCHC: 33.7 g/dL (ref 31.5–36.0)
MCV: 91.7 fL (ref 79.5–101.0)
Monocytes Absolute: 0.3 10*3/uL (ref 0.1–0.9)
Monocytes Relative: 5 %
NEUTROS ABS: 5.5 10*3/uL (ref 1.5–6.5)
Neutrophils Relative %: 84 %
PLATELETS: 279 10*3/uL (ref 145–400)
RBC: 5.11 MIL/uL (ref 3.70–5.45)
RDW: 15.7 % — ABNORMAL HIGH (ref 11.2–14.5)
WBC: 6.5 10*3/uL (ref 3.9–10.3)

## 2017-04-26 LAB — TOTAL PROTEIN, URINE DIPSTICK

## 2017-04-26 MED ORDER — SODIUM CHLORIDE 0.9 % IV SOLN
Freq: Once | INTRAVENOUS | Status: AC
  Administered 2017-04-26: 14:00:00 via INTRAVENOUS

## 2017-04-26 MED ORDER — BEVACIZUMAB CHEMO INJECTION 400 MG/16ML
10.0000 mg/kg | Freq: Once | INTRAVENOUS | Status: AC
  Administered 2017-04-26: 700 mg via INTRAVENOUS
  Filled 2017-04-26: qty 12

## 2017-04-26 NOTE — Patient Instructions (Signed)
[facility] Discharge Instructions for Patients Receiving Chemotherapy  Today you received the following chemotherapy agents Avastin (Bevacizumab).  To help prevent nausea and vomiting after your treatment, we encourage you to take your nausea medication as directed.   If you develop nausea and vomiting that is not controlled by your nausea medication, call the clinic.   BELOW ARE SYMPTOMS THAT SHOULD BE REPORTED IMMEDIATELY:  *FEVER GREATER THAN 100.5 F  *CHILLS WITH OR WITHOUT FEVER  NAUSEA AND VOMITING THAT IS NOT CONTROLLED WITH YOUR NAUSEA MEDICATION  *UNUSUAL SHORTNESS OF BREATH  *UNUSUAL BRUISING OR BLEEDING  TENDERNESS IN MOUTH AND THROAT WITH OR WITHOUT PRESENCE OF ULCERS  *URINARY PROBLEMS  *BOWEL PROBLEMS  UNUSUAL RASH Items with * indicate a potential emergency and should be followed up as soon as possible.  Feel free to call the clinic should you have any questions or concerns. The clinic phone number is [phone].  Please show the [facility] at check-in to the Emergency Department and triage nurse.  Bevacizumab injection What is this medicine? BEVACIZUMAB (be va SIZ yoo mab) is a monoclonal antibody. It is used to treat many types of cancer. This medicine may be used for other purposes; ask your health care provider or pharmacist if you have questions. COMMON BRAND NAME(S): Avastin What should I tell my health care provider before I take this medicine? They need to know if you have any of these conditions: -diabetes -heart disease -high blood pressure -history of coughing up blood -prior anthracycline chemotherapy (e.g., doxorubicin, daunorubicin, epirubicin) -recent or ongoing radiation therapy -recent or planning to have surgery -stroke -an unusual or allergic reaction to bevacizumab, hamster proteins, mouse proteins, other medicines, foods, dyes, or preservatives -pregnant or trying to get pregnant -breast-feeding How  should I use this medicine? This medicine is for infusion into a vein. It is given by a health care professional in a hospital or clinic setting. Talk to your pediatrician regarding the use of this medicine in children. Special care may be needed. Overdosage: If you think you have taken too much of this medicine contact a poison control center or emergency room at once. NOTE: This medicine is only for you. Do not share this medicine with others. What if I miss a dose? It is important not to miss your dose. Call your doctor or health care professional if you are unable to keep an appointment. What may interact with this medicine? Interactions are not expected. This list may not describe all possible interactions. Give your health care provider a list of all the medicines, herbs, non-prescription drugs, or dietary supplements you use. Also tell them if you smoke, drink alcohol, or use illegal drugs. Some items may interact with your medicine. What should I watch for while using this medicine? Your condition will be monitored carefully while you are receiving this medicine. You will need important blood work and urine testing done while you are taking this medicine. This medicine may increase your risk to bruise or bleed. Call your doctor or health care professional if you notice any unusual bleeding. This medicine should be started at least 28 days following major surgery and the site of the surgery should be totally healed. Check with your doctor before scheduling dental work or surgery while you are receiving this treatment. Talk to your doctor if you have recently had surgery or if you have a wound that has not healed. Do not become pregnant while taking this medicine or for 6  months after stopping it. Women should inform their doctor if they wish to become pregnant or think they might be pregnant. There is a potential for serious side effects to an unborn child. Talk to your health care professional  or pharmacist for more information. Do not breast-feed an infant while taking this medicine and for 6 months after the last dose. This medicine has caused ovarian failure in some women. This medicine may interfere with the ability to have a child. You should talk to your doctor or health care professional if you are concerned about your fertility. What side effects may I notice from receiving this medicine? Side effects that you should report to your doctor or health care professional as soon as possible: -allergic reactions like skin rash, itching or hives, swelling of the face, lips, or tongue -chest pain or chest tightness -chills -coughing up blood -high fever -seizures -severe constipation -signs and symptoms of bleeding such as bloody or black, tarry stools; red or dark-brown urine; spitting up blood or brown material that looks like coffee grounds; red spots on the skin; unusual bruising or bleeding from the eye, gums, or nose -signs and symptoms of a blood clot such as breathing problems; chest pain; severe, sudden headache; pain, swelling, warmth in the leg -signs and symptoms of a stroke like changes in vision; confusion; trouble speaking or understanding; severe headaches; sudden numbness or weakness of the face, arm or leg; trouble walking; dizziness; loss of balance or coordination -stomach pain -sweating -swelling of legs or ankles -vomiting -weight gain Side effects that usually do not require medical attention (report to your doctor or health care professional if they continue or are bothersome): -back pain -changes in taste -decreased appetite -dry skin -nausea -tiredness This list may not describe all possible side effects. Call your doctor for medical advice about side effects. You may report side effects to FDA at 1-800-FDA-1088. Where should I keep my medicine? This drug is given in a hospital or clinic and will not be stored at home. NOTE: This sheet is a summary.  It may not cover all possible information. If you have questions about this medicine, talk to your doctor, pharmacist, or health care provider.  2018 Elsevier/Gold Standard (2016-02-17 14:33:29)

## 2017-04-26 NOTE — Progress Notes (Signed)
[facility] at [place], [place] [address] [phone]   Interval Evaluation  Date of Service: 04/26/17 Patient Name: Kelly Maxwell Patient MRN: [id number] Patient DOB: 1945-12-14 Provider: Ventura Sellers, MD  Identifying Statement:  Kelly Maxwell is a 72 y.o. 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  Path demonstrates Gliosarcoma       - 02/18/2012 Radiation Therapy    Completes RT and concurrent Temodar with Dr. Tammi Klippel       - 09/14/2013 Chemotherapy    Completes 18 cycles of adjuvant Temodar, treated at [facility].       09/29/2015 Progression    Progression #1.  Starts metronomic temodar 50mg /m2 daily       - 09/26/2016 Chemotherapy    Completes 1 year metronomic temodar       04/19/2017 Progression    Progression #2.  Recommendation for Avastin 10mg /kg q2 weeks       Interval History:  Kelly Maxwell presents today for initial Avastin infusion. She denies new or progressive neurologic deficits.  She denies any soft tissue injury, cuts or bruises.  No seizures or headaches.  Medications: Current Outpatient Medications on Maxwell Prior to Visit  Medication Sig Dispense Refill  . amphetamine-dextroamphetamine (ADDERALL) 20 MG tablet Take 20 mg by mouth daily.    . Bromfenac Sodium (PROLENSA) 0.07 % SOLN Place 1 drop into the right eye 3 (three) times daily. Reported on 09/02/2015    . clonazePAM (KLONOPIN) 0.5 MG tablet Take 0.5 mg by mouth at bedtime as needed for anxiety.    . Difluprednate (DUREZOL) 0.05 % EMUL Apply 1 drop to eye 4 (four) times daily.    [name] (ATIVAN) 0.5 MG tablet TAKE 1 TABLET 30 TO 60 MINUTES PRIOR TO MRI. MAY REPEAT ONCE FOR 1 ADDITIONAL RELIEF  0  . Melatonin 1 MG CAPS Take 2 capsules by  mouth at bedtime as needed (sleep).     . mirabegron ER (MYRBETRIQ) 50 MG TB24 tablet Take 1 tablet by mouth daily.    . modafinil (PROVIGIL) 100 MG tablet Take 100 mg by mouth daily.     . ondansetron (ZOFRAN) 8 MG tablet Take 1 tab (8mg ) by mouth 1 hour before daily temozolomide. Take every 8 hours as needed for nausea.    . ondansetron (ZOFRAN) 8 MG tablet Take 1 tablet by mouth every 8 (eight) hours as needed.    . tretinoin (RETIN-A) 0.05 % cream Apply 1 application topically at bedtime as needed. spots  0  . venlafaxine XR (EFFEXOR-XR) 37.5 MG 24 hr capsule Take 1 capsule by mouth daily.  3   No current facility-administered medications on Maxwell prior to visit.     Allergies:  Allergies  Allergen Reactions  . Codeine Nausea And Vomiting and Nausea Only  . 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    . Compazine [Prochlorperazine Edisylate] Other (See Comments) and Rash    Became hyper Became hyper   Past Medical History:  Past Medical History:  Diagnosis Date  . Cancer ([place])   . Depression   . GERD (gastroesophageal reflux disease)   . Glial neoplasm of brain (  [place]) 12/17/11   left temporal, grade IV, 2.7 x 3.6 x 2.5 cm ring enhancing; numerous small surrounding satellite lesions 12/13/11  . Headache(784.0)   . Hiatal hernia   . History of radiation therapy 01/07/2012-02/18/12   left temporal  60GY  . HLD (hyperlipidemia)   . PONV (postoperative nausea and vomiting)   . 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 BUNIONECTOMY  2001   right  . CATARACT EXTRACTION Left   . CRANIOTOMY  12/17/2011   Procedure: CRANIOTOMY TUMOR EXCISION;  Surgeon: Otilio Connors, MD;  Location: [facility];  Service: Neurosurgery;  Laterality: Left;  LEFT temporal craniotomy  with stealth for tumor resection  . DILATION AND CURETTAGE OF UTERUS  2007  . TOTAL KNEE ARTHROPLASTY  2000   left  . VENTRAL HERNIA REPAIR N/A 09/03/2015   Procedure: LAPAROSCOPIC repair of incarcerated spigelian hernia with mesh, reduction and repair ;  Surgeon: [name], MD;  Location: [facility];  Service: General;  Laterality: N/A;   Social History:  Social History   Socioeconomic History  . Marital status: Married    Spouse name: Not on Maxwell  . Number of children: 3  . Years of education: Not on Maxwell  . Highest education level: Not on Maxwell  Social Needs  . Financial resource strain: Not on Maxwell  . Food insecurity - worry: Not on Maxwell  . Food insecurity - inability: Not on Maxwell  . Transportation needs - medical: Not on Maxwell  . Transportation needs - non-medical: Not on Maxwell  Occupational History  . Not on Maxwell  Tobacco Use  . Smoking status: Never Smoker  . Smokeless tobacco: Never Used  Substance and Sexual Activity  . Alcohol use: No  . Drug use: No  . Sexual activity: Yes  Other Topics Concern  . Not on Maxwell  Social History Narrative   2nd marriage of 6 years, previously widowed   Family History:  Family History  Problem Relation Age of Onset  . Heart disease Mother     Review of Systems: Constitutional: Denies fevers, chills or abnormal weight loss Eyes: Denies blurriness of vision Ears, nose, mouth, throat, and face: Denies mucositis or sore throat Respiratory: Denies cough, dyspnea or wheezes Cardiovascular: Denies palpitation, chest discomfort or lower extremity swelling Gastrointestinal:  Denies nausea, constipation, diarrhea GU: Denies dysuria or incontinence Skin: Denies abnormal skin rashes Neurological: Per HPI Musculoskeletal: Denies joint pain, back or neck discomfort. No decrease in ROM Behavioral/Psych: Denies anxiety, disturbance in thought content, and mood instability  Physical Exam: Vitals:   04/26/17 1220  BP: 134/75  Pulse: (!) 109    Resp: 18  Temp: 98 F (36.7 C)  SpO2: 96%   KPS: 80. General: Alert, cooperative, pleasant, in no acute distress Head: Normal EENT: No conjunctival injection or scleral icterus. Oral mucosa moist Lungs: Resp effort normal Cardiac: Regular rate and rhythm Abdomen: Soft, non-distended abdomen Skin: No rashes cyanosis or petechiae. Extremities: No clubbing or edema  Neurologic Exam: Mental Status: Awake, alert, attentive to examiner. Oriented to self and environment. Language is fluent with intact comprehension.  Age advanced psychomotor slowing with impaired short term memory. Cranial Nerves: Visual acuity is grossly normal. Visual fields are full. Extra-ocular movements intact. Left eye ptosis, chronic. Face is symmetric, tongue midline. Motor: Tone and bulk are normal. Power is full in  both arms and legs. Reflexes are symmetric, no pathologic reflexes present. Intact finger to nose bilaterally Sensory: Intact to light touch and temperature Gait: Slow, deliberate.  Unable to tandem   Labs: I have reviewed the data as listed    Component Value Date/Time   NA 141 04/26/2017 1152   NA 140 09/11/2013 1238   K 3.7 04/26/2017 1152   K 4.7 09/11/2013 1238   CL 108 04/26/2017 1152   CL 111 (H) 08/04/2012 1008   CO2 24 04/26/2017 1152   CO2 23 09/11/2013 1238   GLUCOSE 90 04/26/2017 1152   GLUCOSE 87 09/11/2013 1238   GLUCOSE 86 08/04/2012 1008   BUN 13 04/26/2017 1152   BUN 13.3 10/29/2014 1127   CREATININE 0.70 04/26/2017 1152   CREATININE 0.8 10/29/2014 1127   CALCIUM 9.9 04/26/2017 1152   CALCIUM 9.5 09/11/2013 1238   PROT 7.0 04/26/2017 1152   PROT 6.6 09/11/2013 1238   ALBUMIN 3.9 04/26/2017 1152   ALBUMIN 3.9 09/11/2013 1238   AST 22 04/26/2017 1152   AST 19 09/11/2013 1238   ALT 29 04/26/2017 1152   ALT 27 09/11/2013 1238   ALKPHOS 101 04/26/2017 1152   ALKPHOS 106 09/11/2013 1238   BILITOT 0.9 04/26/2017 1152   BILITOT 0.72 09/11/2013 1238   GFRNONAA >60  04/26/2017 1152   GFRAA >60 04/26/2017 1152   Lab Results  Component Value Date   WBC 6.5 04/26/2017   NEUTROABS 5.5 04/26/2017   HGB 13.8 05/11/2016   HCT 46.9 (H) 04/26/2017   MCV 91.7 04/26/2017   PLT 279 04/26/2017     Assessment/Plan 1. 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  She is clear to undergo new-start Avastin infusion today.  Therapy will occur q2 weeks at the 10mg /kg dose level.    We will communicate any updates or changes with Dr. Durenda Age office at [facility].  We appreciate the opportunity to participate in the care of Kelly Maxwell.   All questions were answered. The patient knows to call the clinic with any problems, questions or concerns. No barriers to learning were detected.  The total time spent in the encounter was 15 minutes and more than 50% was on counseling and review of test results   Ventura Sellers, MD Medical Director of Neuro-Oncology [facility] at [place] 04/26/17 12:50 PM

## 2017-04-29 ENCOUNTER — Ambulatory Visit: Payer: Medicare HMO | Attending: Internal Medicine | Admitting: Physical Therapy

## 2017-04-29 DIAGNOSIS — R2689 Other abnormalities of gait and mobility: Secondary | ICD-10-CM | POA: Insufficient documentation

## 2017-04-29 DIAGNOSIS — R2681 Unsteadiness on feet: Secondary | ICD-10-CM | POA: Insufficient documentation

## 2017-04-29 DIAGNOSIS — M6281 Muscle weakness (generalized): Secondary | ICD-10-CM | POA: Insufficient documentation

## 2017-04-30 ENCOUNTER — Ambulatory Visit: Payer: Medicare HMO | Admitting: Physical Therapy

## 2017-05-01 ENCOUNTER — Telehealth: Payer: Self-pay | Admitting: *Deleted

## 2017-05-01 ENCOUNTER — Other Ambulatory Visit: Payer: Self-pay

## 2017-05-01 ENCOUNTER — Encounter: Payer: Self-pay | Admitting: Physical Therapy

## 2017-05-01 ENCOUNTER — Ambulatory Visit: Payer: Medicare HMO | Admitting: Physical Therapy

## 2017-05-01 DIAGNOSIS — R2681 Unsteadiness on feet: Secondary | ICD-10-CM

## 2017-05-01 DIAGNOSIS — R2689 Other abnormalities of gait and mobility: Secondary | ICD-10-CM | POA: Diagnosis not present

## 2017-05-01 DIAGNOSIS — M6281 Muscle weakness (generalized): Secondary | ICD-10-CM

## 2017-05-01 NOTE — Telephone Encounter (Signed)
TCT patient to follow up with her after her 1st Avastin treatment last week. Spoke with patient and she states she feels well and feels the same as before her treatment. No questions or concerns. She is aware of her appts next week.

## 2017-05-01 NOTE — Telephone Encounter (Signed)
-----   Message from Rennis Harding, RN sent at 04/29/2017  3:09 PM EST ----- Regarding: Dr. Lindi Adie 1st time chemo f/u 1st time chemo f/u

## 2017-05-02 NOTE — Therapy (Signed)
[facility] [address] [place], [place], [address] Phone: [phone]   Fax:  [phone]  Physical Therapy Evaluation  Patient Details  Name: Kelly Maxwell MRN: [id number] Date of Birth: [birth year] Referring Provider: Cecil Cobbs, MD   Encounter Date: 05/01/2017  PT End of Session - 05/01/17 1707    Visit Number  1    Number of Visits  17    Date for PT Re-Evaluation  06/28/17    Authorization Type  [employer]    PT Start Time  1530    PT Stop Time  1612    PT Time Calculation (min)  42 min    Equipment Utilized During Treatment  Gait belt    Activity Tolerance  Patient limited by fatigue    Behavior During Therapy  [facility] for tasks assessed/performed       Past Medical History:  Diagnosis Date  . Cancer ([place])   . Depression   . GERD (gastroesophageal reflux disease)   . Glial neoplasm of brain ([place]) 12/17/11   left temporal, grade IV, 2.7 x 3.6 x 2.5 cm ring enhancing; numerous small surrounding satellite lesions 12/13/11  . Headache(784.0)   . Hiatal hernia   . History of radiation therapy 01/07/2012-02/18/12   left temporal  60GY  . HLD (hyperlipidemia)   . PONV (postoperative nausea and vomiting)   . 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 BUNIONECTOMY  2001   right  . CATARACT EXTRACTION Left   . CRANIOTOMY  12/17/2011   Procedure: CRANIOTOMY TUMOR EXCISION;  Surgeon: Otilio Connors, MD;  Location: [facility];  Service: Neurosurgery;  Laterality: Left;  LEFT temporal craniotomy with stealth for tumor resection  . DILATION AND CURETTAGE OF UTERUS  2007  . TOTAL KNEE ARTHROPLASTY  2000   left  . VENTRAL HERNIA REPAIR N/A 09/03/2015   Procedure: LAPAROSCOPIC repair of incarcerated spigelian hernia with mesh, reduction and repair ;  Surgeon: [name], MD;  Location:  [facility];  Service: General;  Laterality: N/A;    There were no vitals filed for this visit.   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  Craniotomy, radiation & chemo '15, '18 & '19. January 2019 she had a return of enhancement in temporal area consistent with disease progression.     Pertinent History  Gliosarcoma, anxiety, depression, GERD, herpes left eye, hiatal hernia    Limitations  Lifting;Standing;Walking    Patient Stated Goals  To improve walking, be able to go upstairs to master bedroom & improve balance.     Currently in Pain?  No/denies         [facility] PT Assessment - 05/01/17 1530      Assessment   Medical Diagnosis  Gliosarcoma    Referring Provider  Cecil Cobbs, MD    Onset Date/Surgical Date  12/17/11    Hand Dominance  Right    Prior Therapy  no      Precautions   Precautions  Fall      Balance Screen   Has the patient fallen in the past 6 months  No    Has the  patient had a decrease in activity level because of a fear of falling?   Yes limits stairs    Is the patient reluctant to leave their home because of a fear of falling?   No      Home Social worker  Private residence    Living Arrangements  Spouse/significant other;Children 38yo dtr    Type of [place] to enter    Entrance Stairs-Number of Steps  5    Entrance Stairs-Rails  Right    Home Layout  Two level;Able to live on main level with bedroom/bathroom basement - storage, 12 steps with left rail 1/2 way    Alternate Level Stairs-Number of Steps  12 to landing + 4    Alternate Level Stairs-Rails  Right;Left    Home Equipment  Walker - 4 wheels;Cane - single point;Shower seat      Prior Function   Level of  Independence  Independent with household mobility without device;Independent with community mobility without device    Leisure  walk outside, gardening      Posture/Postural Control   Posture/Postural Control  No significant limitations      ROM / Strength   AROM / PROM / Strength  Strength;AROM      AROM   Overall AROM   Within functional limits for tasks performed      Strength   Overall Strength  Deficits    Strength Assessment Site  Hip;Knee;Ankle    Right/Left Hip  Right;Left    Right Hip Flexion  4/5    Right Hip Extension  3+/5    Right Hip ABduction  4-/5    Left Hip Flexion  5/5    Left Hip Extension  4/5    Left Hip ABduction  4/5    Right/Left Knee  Right;Left    Right Knee Flexion  4/5    Right Knee Extension  4/5    Left Knee Extension  5/5    Right/Left Ankle  Right;Left    Right Ankle Dorsiflexion  4/5    Left Ankle Dorsiflexion  5/5      Transfers   Transfers  Sit to Stand;Stand to Sit    Sit to Stand  5: Supervision;With upper extremity assist;With armrests;From chair/3-in-1 unable without UE assist    Stand to Sit  5: Supervision;With upper extremity assist;With armrests;To chair/3-in-1      Ambulation/Gait   Ambulation/Gait  Yes    Ambulation/Gait Assistance  4: Min assist    Ambulation Distance (Feet)  150 Feet    Assistive device  None    Gait Pattern  Step-through pattern;Decreased arm swing - right;Decreased step length - left;Decreased stance time - right;Left flexed knee in stance;Antalgic;Wide base of support    Ambulation Surface  Indoor    Gait velocity  2.53 ft/sec    Stairs  Yes    Stairs Assistance  5: Supervision    Stair Management Technique  Two rails;Step to pattern;Forwards    Number of Stairs  4      Standardized Balance Assessment   Standardized Balance Assessment  Berg Balance Test      Berg Balance Test   Sit to Stand  Able to stand  independently using hands    Standing Unsupported  Able to stand safely 2 minutes     Sitting with Back Unsupported but Feet Supported on Floor or Stool  Able to sit safely and securely 2 minutes  Stand to Sit  Controls descent by using hands    Transfers  Able to transfer safely, definite need of hands    Standing Unsupported with Eyes Closed  Able to stand 10 seconds with supervision    Standing Ubsupported with Feet Together  Able to place feet together independently and stand for 1 minute with supervision    From Standing, Reach Forward with Outstretched Arm  Can reach forward >12 cm safely (5")    From Standing Position, Pick up Object from [place] to pick up shoe, needs supervision    From Standing Position, Turn to Look Behind Over each Shoulder  Needs supervision when turning    Turn 360 Degrees  Needs close supervision or verbal cueing    Standing Unsupported, Alternately Place Feet on Step/Stool  Able to complete >2 steps/needs minimal assist    Standing Unsupported, One Foot in Front  Able to take small step independently and hold 30 seconds    Standing on One Leg  Tries to lift leg/unable to hold 3 seconds but remains standing independently    Total Score  35      Functional Gait  Assessment   Gait assessed   Yes    Gait Level Surface  Walks 20 ft, slow speed, abnormal gait pattern, evidence for imbalance or deviates 10-15 in outside of the 12 in walkway width. Requires more than 7 sec to ambulate 20 ft.    Change in Gait Speed  Makes only minor adjustments to walking speed, or accomplishes a change in speed with significant gait deviations, deviates 10-15 in outside the 12 in walkway width, or changes speed but loses balance but is able to recover and continue walking.    Gait with Horizontal Head Turns  Performs head turns with moderate changes in gait velocity, slows down, deviates 10-15 in outside 12 in walkway width but recovers, can continue to walk.    Gait with Vertical Head Turns  Performs task with moderate change in gait velocity, slows down, deviates  10-15 in outside 12 in walkway width but recovers, can continue to walk.    Gait and Pivot Turn  Turns slowly, requires verbal cueing, or requires several small steps to catch balance following turn and stop    Step Over Obstacle  Cannot perform without assistance.    Gait with Narrow Base of Support  Ambulates less than 4 steps heel to toe or cannot perform without assistance.    Gait with Eyes Closed  Walks 20 ft, slow speed, abnormal gait pattern, evidence for imbalance, deviates 10-15 in outside 12 in walkway width. Requires more than 9 sec to ambulate 20 ft.    Ambulating Backwards  Walks 20 ft, slow speed, abnormal gait pattern, evidence for imbalance, deviates 10-15 in outside 12 in walkway width.    Steps  Two feet to a stair, must use rail.    Total Score  8             Objective measurements completed on examination: See above findings.                PT Short Term Goals - 05/01/17 1726      PT SHORT TERM GOAL #1   Title  Patient demonstrates understanding of initial HEP. (All STGs Target Date: 05/31/2017)    Time  4    Period  Weeks    Status  New    Target Date  05/31/17  PT SHORT TERM GOAL #2   Title  Patient ambulates with scanning maintaining path & pace with supervision.     Time  4    Period  Weeks    Status  New    Target Date  05/31/17      PT SHORT TERM GOAL #3   Title  Patient negotiates 12 steps with 2 rails with supervision.     Time  4    Period  Weeks    Status  New    Target Date  05/31/17      PT SHORT TERM GOAL #4   Title  Patient reaches 10", to floor and looks over shoulders with weight shift & trunk rotation safely.     Time  4    Period  Weeks    Status  New    Target Date  05/31/17        PT Long Term Goals - 05/01/17 1721      PT LONG TERM GOAL #1   Title  Patient demonstrates understanding of ongoing HEP. (All LTGs Target Date: 06/28/2017)    Time  8    Period  Weeks    Status  New    Target Date  06/28/17       PT LONG TERM GOAL #2   Title  Berg Balance >45/56 to indicate lower fall risk.     Time  8    Period  Weeks    Status  New    Target Date  06/28/17      PT LONG TERM GOAL #3   Title  Functional Gait Assessment >/= 15/30 to indicate lower fall risk.     Time  8    Period  Weeks    Status  New    Target Date  06/28/17      PT LONG TERM GOAL #4   Title  Patient ambulates 1000' without device outdoor including ramps, curbs & grass for community mobility.    Time  8    Period  Weeks    Status  New    Target Date  06/28/17      PT LONG TERM GOAL #5   Title  Patient negoatiates stairs (16 total) with 2 rails to simulate her home with supervision.    Time  8    Period  Weeks    Status  New    Target Date  06/28/17             Plan - 05/01/17 1711    Clinical Impression Statement  This 72yo female has been dealing with issues from [place] since 12/17/2011. Her husband and she report increased fatigue & weakness over recent few months. She required frequent rest during session with HR 115-129 bpm with low intensity activities with dyspnea 3/4. She has weakness BLE with Right > left. She has gait deviations, gait velocity of 2.53 ft/sec and Functional Gait Activity 8/30 indicate high fall risk. Berg Balance 35/56 indicates high fall risk also. Patient appears would benefit from physical therapy to improve mobility and safety.     History and Personal Factors relevant to plan of care:  Gliosarcoma, anxiety, depression, GERD, herpes left eye, hiatal hernia    Clinical Presentation  Evolving    Clinical Presentation due to:  recurrence of Gliosarcoma, fatigue, lives with elderly husband and daughter who works    Clinical Decision Making  Moderate    Rehab Potential  Good    Clinical Impairments  Affecting Rehab Potential  endurance, strength    PT Frequency  2x / week    PT Duration  8 weeks    PT Treatment/Interventions  ADLs/Self Care Home Management;DME Instruction;Gait  training;Stair training;Functional mobility training;Therapeutic activities;Therapeutic exercise;Balance training;Neuromuscular re-education;Patient/family education    PT Next Visit Plan  HEP for balance & strength    Consulted and Agree with Plan of Care  Patient;Family member/caregiver    Family Member Consulted  husband       Patient will benefit from skilled therapeutic intervention in order to improve the following deficits and impairments:  Abnormal gait, Decreased activity tolerance, Cardiopulmonary status limiting activity, Decreased balance, Decreased coordination, Decreased endurance, Decreased mobility, Decreased strength  Visit Diagnosis: Muscle weakness (generalized)  Other abnormalities of gait and mobility  Unsteadiness on feet     Problem List Patient Active Problem List   Diagnosis Date Noted  . 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 Anxiety and depression 09/03/2015  . Personal history of other infectious and parasitic diseases 09/03/2015  . 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 HLD (hyperlipidemia)   . GERD (gastroesophageal reflux disease)   . PONV (postoperative nausea and vomiting)   . 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 Hiatal hernia     Vraj Denardo PT, DPT 05/02/2017, 5:31 PM  [facility] [address] [place], [place], [address] Phone: [phone]   Fax:  [phone]  Name: SHAMECCA WHITEBREAD MRN: [id number] Date of Birth: Mar 23, 1945

## 2017-05-02 NOTE — Addendum Note (Signed)
Addended by: Isaias Cowman on: 05/02/2017 05:33 PM   Modules accepted: Orders

## 2017-05-07 ENCOUNTER — Encounter: Payer: Self-pay | Admitting: Physical Therapy

## 2017-05-07 ENCOUNTER — Ambulatory Visit: Payer: Medicare HMO | Attending: Internal Medicine | Admitting: Physical Therapy

## 2017-05-07 VITALS — HR 54

## 2017-05-07 DIAGNOSIS — R2681 Unsteadiness on feet: Secondary | ICD-10-CM | POA: Diagnosis not present

## 2017-05-07 DIAGNOSIS — M6281 Muscle weakness (generalized): Secondary | ICD-10-CM | POA: Insufficient documentation

## 2017-05-07 DIAGNOSIS — R2689 Other abnormalities of gait and mobility: Secondary | ICD-10-CM | POA: Diagnosis not present

## 2017-05-07 NOTE — Patient Instructions (Addendum)
  Feet Together (Compliant Surface) Head Motion - Eyes Open    For this exercise, stand with your back to the corner, close to the walls but not touching the walls. Have a chair back in front of you and someone with you when you do this exercise.   With eyes open, standing on compliant surface:(folded blanket, old pillow)  feet together, turn head slowly: up and down x 10 reps, left and right x 10 reps. Do 1 sessions per day.  Copyright  VHI. All rights reserved.    Tandem Walking    Beside your counter or in a hallway near the wall, walk with each foot directly in front of other, heel of one foot touching toes of other foot with each step. Both feet straight ahead. Keep your eyes looking forward (not down!). Do 5 lengths.   Backward Walking    Beside your counter or in a hallway, walk backward, toes of each foot coming down first. Take long, even strides. Make sure you have a clear pathway with no obstructions when you do this.   Copyright  VHI. All rights reserved.

## 2017-05-07 NOTE — Therapy (Signed)
[facility] [address] [place], [place], [address] Phone: [phone]   Fax:  [phone]  Physical Therapy Treatment  Patient Details  Name: Kelly Maxwell MRN: [id number] Date of Birth: 05-22-1945 Referring Provider: Cecil Cobbs, MD   Encounter Date: 05/07/2017  PT End of Session - 05/07/17 1648    Visit Number  2    Number of Visits  17    Date for PT Re-Evaluation  06/28/17    Authorization Type  [employer]    PT Start Time  1530    PT Stop Time  1615    PT Time Calculation (min)  45 min    Activity Tolerance  Patient limited by fatigue requested seated rest x 2    Behavior During Therapy  [facility] for tasks assessed/performed;Flat affect       Past Medical History:  Diagnosis Date  . Cancer ([place])   . Depression   . GERD (gastroesophageal reflux disease)   . 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 Headache(784.0)   . Hiatal hernia   . History of radiation therapy 01/07/2012-02/18/12   left temporal  60GY  . HLD (hyperlipidemia)   . PONV (postoperative nausea and vomiting)   . 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 BUNIONECTOMY  2001   right  . CATARACT EXTRACTION Left   . CRANIOTOMY  12/17/2011   Procedure: CRANIOTOMY TUMOR EXCISION;  Surgeon: Otilio Connors, MD;  Location: [facility];  Service: Neurosurgery;  Laterality: Left;  LEFT temporal craniotomy with stealth for tumor resection  . DILATION AND CURETTAGE OF UTERUS  2007  . TOTAL KNEE ARTHROPLASTY  2000   left  . VENTRAL HERNIA REPAIR N/A 09/03/2015   Procedure: LAPAROSCOPIC repair of incarcerated spigelian hernia with mesh, reduction and repair ;  Surgeon: [name], MD;  Location: [facility];   Service: General;  Laterality: N/A;    Vitals:   05/07/17 1536  Pulse: (!) 54 irregular   At rest, beginning of session    Subjective Assessment - 05/07/17 1533    Subjective  Reports she feels her heart race at times, usually when active (doesn't think it happens at rest).    Pertinent History  Gliosarcoma, anxiety, depression, GERD, herpes left eye, hiatal hernia    Limitations  Lifting;Standing;Walking    Patient Stated Goals  To improve walking, be able to go upstairs to master bedroom & improve balance.     Currently in Pain?  No/denies                      [facility] Adult PT Treatment/Exercise - 05/07/17 1637      Transfers   Transfers  Sit to Stand;Stand to Sit    Sit to Stand  5: Supervision;With upper extremity assist    Sit to Stand Details (indicate cue type and reason)  appears weak and unsteady    Stand to Sit  5: Supervision      Ambulation/Gait   Ambulation/Gait  Yes    Ambulation/Gait Assistance  4: Min guard    Ambulation Distance (Feet)  350 Feet 115    Assistive device  None    Gait Pattern  Step-through pattern;Decreased arm swing - right;Decreased  step length - left;Decreased stance time - right;Wide base of support    Ambulation Surface  Indoor    Gait Comments  drifting slightly left and right, however when cued to walk as quickly as she feels safe she did better staying on straight path          Balance Exercises - 05/07/17 1644      Balance Exercises: Standing   Standing Eyes Opened  Narrow base of support (BOS);Wide (BOA);Head turns;Foam/compliant surface;Solid surface difficulty progressed to point of approp challenge    Standing, One Foot on a Step  Eyes open;Eyes closed;Foam/compliant surface    Tandem Gait  Forward;Upper extremity support;4 reps one hand along counter    Retro Gait  Upper extremity support;4 reps one hand along counter    Sidestepping  2 reps no UE support, good balance        PT Education - 05/07/17 1647     Education provided  Yes    Education Details  see HEP; husband must be present during exercises in case of loss of balance    Person(s) Educated  Patient;Spouse husband not present during session; educated in lobby with PT demonstrating    Methods  Explanation;Demonstration;Verbal cues;Handout    Comprehension  Verbalized understanding;Returned demonstration;Verbal cues required;Need further instruction       PT Short Term Goals - 05/01/17 1726      PT SHORT TERM GOAL #1   Title  Patient demonstrates understanding of initial HEP. (All STGs Target Date: 05/31/2017)    Time  4    Period  Weeks    Status  New    Target Date  05/31/17      PT SHORT TERM GOAL #2   Title  Patient ambulates with scanning maintaining path & pace with supervision.     Time  4    Period  Weeks    Status  New    Target Date  05/31/17      PT SHORT TERM GOAL #3   Title  Patient negotiates 12 steps with 2 rails with supervision.     Time  4    Period  Weeks    Status  New    Target Date  05/31/17      PT SHORT TERM GOAL #4   Title  Patient reaches 10", to floor and looks over shoulders with weight shift & trunk rotation safely.     Time  4    Period  Weeks    Status  New    Target Date  05/31/17        PT Long Term Goals - 05/01/17 1721      PT LONG TERM GOAL #1   Title  Patient demonstrates understanding of ongoing HEP. (All LTGs Target Date: 06/28/2017)    Time  8    Period  Weeks    Status  New    Target Date  06/28/17      PT LONG TERM GOAL #2   Title  Berg Balance >45/56 to indicate lower fall risk.     Time  8    Period  Weeks    Status  New    Target Date  06/28/17      PT LONG TERM GOAL #3   Title  Functional Gait Assessment >/= 15/30 to indicate lower fall risk.     Time  8    Period  Weeks    Status  New    Target Date  06/28/17      PT LONG TERM GOAL #4   Title  Patient ambulates 1000' without device outdoor including ramps, curbs & grass for community mobility.    Time   8    Period  Weeks    Status  New    Target Date  06/28/17      PT LONG TERM GOAL #5   Title  Patient negoatiates stairs (16 total) with 2 rails to simulate her home with supervision.    Time  8    Period  Weeks    Status  New    Target Date  06/28/17            Plan - 05/07/17 1649    Clinical Impression Statement  Session focused on developing a HEP to address her balance deficits. Patient did better than expected on several tasks and ultimately given 3 exercises to continue at home. She continued to fatigue easily and requested seated rest breaks x 2. Her husband remained in the lobby today and it was apparent that she has a difficult time with her memory. (Often answered "I don't know") Not indicated on initial evaluation that cognition was an issue, however it is likely her husband helped answer many questions. Will continue to monitor. Next session will add LE strengthening exercises to HEP.     Rehab Potential  Good    Clinical Impairments Affecting Rehab Potential  endurance, strength    PT Frequency  2x / week    PT Duration  8 weeks    PT Treatment/Interventions  ADLs/Self Care Home Management;DME Instruction;Gait training;Stair training;Functional mobility training;Therapeutic activities;Therapeutic exercise;Balance training;Neuromuscular re-education;Patient/family education    PT Next Visit Plan  add exercises for strengthening (HEP issued 3/5 for balance); husband will need to be educated on exercises based on pt's poor memory 3/5. ?sit to stand, step ups, ?hip exercises at counter (may need theraband and not sure she+husband would be safe with that)    PT Home Exercise Plan  corner ex on foam EO with feet together and head turns; tandem walking fwd; walking backward    Consulted and Agree with Plan of Care  Patient;Family member/caregiver    Family Member Consulted  husband       Patient will benefit from skilled therapeutic intervention in order to improve the  following deficits and impairments:  Abnormal gait, Decreased activity tolerance, Cardiopulmonary status limiting activity, Decreased balance, Decreased coordination, Decreased endurance, Decreased mobility, Decreased strength  Visit Diagnosis: Unsteadiness on feet  Other abnormalities of gait and mobility     Problem List Patient Active Problem List   Diagnosis Date Noted  . 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 Anxiety and depression 09/03/2015  . Personal history of other infectious and parasitic diseases 09/03/2015  . 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 HLD (hyperlipidemia)   . GERD (gastroesophageal reflux disease)   . PONV (postoperative nausea and vomiting)   . 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 Hiatal hernia     Rexanne Mano , PT 05/07/2017, 4:58 PM  [facility] [address] [place], [place], [address] Phone: [phone]   Fax:  [phone]  Name: CANDIE GINTZ MRN: [id number] Date of Birth: August 24, 1945

## 2017-05-08 ENCOUNTER — Encounter: Payer: Self-pay | Admitting: Internal Medicine

## 2017-05-10 ENCOUNTER — Inpatient Hospital Stay: Payer: Medicare HMO

## 2017-05-10 ENCOUNTER — Inpatient Hospital Stay: Payer: Medicare HMO | Attending: Internal Medicine

## 2017-05-10 ENCOUNTER — Inpatient Hospital Stay: Payer: Medicare HMO | Admitting: Internal Medicine

## 2017-05-10 DIAGNOSIS — C719 Malignant neoplasm of brain, unspecified: Secondary | ICD-10-CM | POA: Insufficient documentation

## 2017-05-10 DIAGNOSIS — Z9221 Personal history of antineoplastic chemotherapy: Secondary | ICD-10-CM | POA: Insufficient documentation

## 2017-05-10 DIAGNOSIS — Z5112 Encounter for antineoplastic immunotherapy: Secondary | ICD-10-CM | POA: Diagnosis not present

## 2017-05-10 DIAGNOSIS — E785 Hyperlipidemia, unspecified: Secondary | ICD-10-CM | POA: Diagnosis not present

## 2017-05-10 DIAGNOSIS — K219 Gastro-esophageal reflux disease without esophagitis: Secondary | ICD-10-CM | POA: Diagnosis not present

## 2017-05-10 DIAGNOSIS — Z923 Personal history of irradiation: Secondary | ICD-10-CM | POA: Diagnosis not present

## 2017-05-10 DIAGNOSIS — Z79899 Other long term (current) drug therapy: Secondary | ICD-10-CM | POA: Insufficient documentation

## 2017-05-10 LAB — TOTAL PROTEIN, URINE DIPSTICK: Protein, ur: <30 mg/dL

## 2017-05-10 NOTE — Progress Notes (Signed)
Patient showed up several hours late for her infusion today with spouse. She asked to be treated. NO labs were needed and MD was gone. Patient reports feeling well but orders were not signed by MD and BP was higher than allowed parameters. MD made aware by phone call and he will contact patient on Monday to reschedule. Patient and spouse agreed.

## 2017-05-13 ENCOUNTER — Ambulatory Visit: Payer: Medicare HMO | Admitting: Physical Therapy

## 2017-05-14 ENCOUNTER — Encounter: Payer: Self-pay | Admitting: Internal Medicine

## 2017-05-14 ENCOUNTER — Telehealth: Payer: Self-pay | Admitting: Internal Medicine

## 2017-05-14 NOTE — Telephone Encounter (Signed)
Scheduled appt per 3/11 sch msg - spoke with patient's husband regarding appts.

## 2017-05-15 DIAGNOSIS — N3944 Nocturnal enuresis: Secondary | ICD-10-CM | POA: Diagnosis not present

## 2017-05-15 DIAGNOSIS — N3946 Mixed incontinence: Secondary | ICD-10-CM | POA: Diagnosis not present

## 2017-05-16 ENCOUNTER — Encounter (INDEPENDENT_AMBULATORY_CARE_PROVIDER_SITE_OTHER): Payer: Medicare HMO | Admitting: Ophthalmology

## 2017-05-17 ENCOUNTER — Telehealth: Payer: Self-pay | Admitting: Internal Medicine

## 2017-05-17 ENCOUNTER — Inpatient Hospital Stay: Payer: Medicare HMO

## 2017-05-17 ENCOUNTER — Other Ambulatory Visit: Payer: Self-pay | Admitting: Internal Medicine

## 2017-05-17 ENCOUNTER — Ambulatory Visit: Payer: Medicare HMO | Admitting: Physical Therapy

## 2017-05-17 ENCOUNTER — Inpatient Hospital Stay (HOSPITAL_BASED_OUTPATIENT_CLINIC_OR_DEPARTMENT_OTHER): Payer: Medicare HMO | Admitting: Internal Medicine

## 2017-05-17 VITALS — BP 135/88 | HR 87 | Temp 98.4°F | Resp 18 | Ht 66.0 in | Wt 146.7 lb

## 2017-05-17 DIAGNOSIS — Z5112 Encounter for antineoplastic immunotherapy: Secondary | ICD-10-CM | POA: Diagnosis not present

## 2017-05-17 DIAGNOSIS — Z9221 Personal history of antineoplastic chemotherapy: Secondary | ICD-10-CM | POA: Diagnosis not present

## 2017-05-17 DIAGNOSIS — K219 Gastro-esophageal reflux disease without esophagitis: Secondary | ICD-10-CM | POA: Diagnosis not present

## 2017-05-17 DIAGNOSIS — C719 Malignant neoplasm of brain, unspecified: Secondary | ICD-10-CM

## 2017-05-17 DIAGNOSIS — E785 Hyperlipidemia, unspecified: Secondary | ICD-10-CM | POA: Diagnosis not present

## 2017-05-17 DIAGNOSIS — Z923 Personal history of irradiation: Secondary | ICD-10-CM | POA: Diagnosis not present

## 2017-05-17 DIAGNOSIS — C712 Malignant neoplasm of temporal lobe: Secondary | ICD-10-CM

## 2017-05-17 DIAGNOSIS — Z79899 Other long term (current) drug therapy: Secondary | ICD-10-CM | POA: Diagnosis not present

## 2017-05-17 LAB — CBC WITH DIFFERENTIAL (CANCER CENTER ONLY)
BASOS PCT: 0 %
Basophils Absolute: 0 10*3/uL (ref 0.0–0.1)
Eosinophils Absolute: 0.1 10*3/uL (ref 0.0–0.5)
Eosinophils Relative: 1 %
HEMATOCRIT: 45.3 % (ref 34.8–46.6)
Hemoglobin: 15.4 g/dL (ref 11.6–15.9)
Lymphocytes Relative: 10 %
Lymphs Abs: 0.7 10*3/uL — ABNORMAL LOW (ref 0.9–3.3)
MCH: 31.4 pg (ref 25.1–34.0)
MCHC: 34 g/dL (ref 31.5–36.0)
MCV: 92.4 fL (ref 79.5–101.0)
MONO ABS: 0.3 10*3/uL (ref 0.1–0.9)
MONOS PCT: 4 %
NEUTROS ABS: 6.5 10*3/uL (ref 1.5–6.5)
Neutrophils Relative %: 85 %
Platelet Count: 156 10*3/uL (ref 145–400)
RBC: 4.9 MIL/uL (ref 3.70–5.45)
RDW: 15.2 % — AB (ref 11.2–14.5)
WBC Count: 7.7 10*3/uL (ref 3.9–10.3)

## 2017-05-17 LAB — CMP (CANCER CENTER ONLY)
ALBUMIN: 3.9 g/dL (ref 3.5–5.0)
ALK PHOS: 104 U/L (ref 40–150)
ALT: 21 U/L (ref 0–55)
ANION GAP: 11 (ref 3–11)
AST: 15 U/L (ref 5–34)
BUN: 17 mg/dL (ref 7–26)
CALCIUM: 10.2 mg/dL (ref 8.4–10.4)
CO2: 22 mmol/L (ref 22–29)
Chloride: 107 mmol/L (ref 98–109)
Creatinine: 0.64 mg/dL (ref 0.60–1.10)
GFR, Est AFR Am: >60 mL/min (ref >60)
GFR, Estimated: >60 mL/min (ref >60)
GLUCOSE: 74 mg/dL (ref 70–140)
Potassium: 3.2 mmol/L — ABNORMAL LOW (ref 3.5–5.1)
SODIUM: 140 mmol/L (ref 136–145)
Total Bilirubin: 0.8 mg/dL (ref 0.2–1.2)
Total Protein: 7.1 g/dL (ref 6.4–8.3)

## 2017-05-17 MED ORDER — LACOSAMIDE 50 MG PO TABS: 50.0000 mg | ORAL_TABLET | Freq: Two times a day (BID) | ORAL | 0 refills | Status: DC

## 2017-05-17 MED ORDER — SODIUM CHLORIDE 0.9 % IV SOLN
Freq: Once | INTRAVENOUS | Status: AC
  Administered 2017-05-17: 12:00:00 via INTRAVENOUS

## 2017-05-17 MED ORDER — SODIUM CHLORIDE 0.9 % IV SOLN
10.0000 mg/kg | Freq: Once | INTRAVENOUS | Status: AC
  Administered 2017-05-17: 700 mg via INTRAVENOUS
  Filled 2017-05-17: qty 16

## 2017-05-17 NOTE — Telephone Encounter (Signed)
Gave patient AVS and calendar of upcoming march appointments.

## 2017-05-17 NOTE — Progress Notes (Signed)
[facility] at [place], [place] [address] [phone]   Interval Evaluation  Date of Service: 05/17/17 Patient Name: Kelly Maxwell Patient MRN: [id number] Patient DOB: 04/14/1945 Provider: Ventura Sellers, MD  Identifying Statement:  [name] is a 72 y.o. 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  Path demonstrates Gliosarcoma       - 02/18/2012 Radiation Therapy    Completes RT and concurrent Temodar with Dr. Tammi Klippel       - 09/14/2013 Chemotherapy    Completes 18 cycles of adjuvant Temodar, treated at [facility].       09/29/2015 Progression    Progression #1.  Starts metronomic temodar 50mg /m2 daily       - 09/26/2016 Chemotherapy    Completes 1 year metronomic temodar       04/19/2017 Progression    Progression #2.  Recommendation for Avastin 10mg /kg q2 weeks       Interval History:  Kelly Maxwell presents today for initial Avastin infusion. She denies new or progressive neurologic deficits.  Her husband does describe events recently, described as "10-15 minutes of inability to speak or understand".  These may be happening daily.  Kelly Maxwell started her on decadron 2mg  BID and ordered a urinalysis.  She denies any soft tissue injury, cuts or bruises.  No convulsive seizures or headaches.  Medications: Current Outpatient Medications on File Prior to Visit  Medication Sig Dispense Refill  . amphetamine-dextroamphetamine (ADDERALL) 20 MG tablet Take 20 mg by mouth daily.    . Bromfenac Sodium (PROLENSA) 0.07 % SOLN Place 1 drop into the right eye 3 (three) times daily. Reported on 09/02/2015    . clonazePAM (KLONOPIN) 0.5 MG tablet Take 0.5 mg by mouth at bedtime as needed for anxiety.    . Difluprednate (DUREZOL) 0.05  % EMUL Apply 1 drop to eye 4 (four) times daily.    [name] (ATIVAN) 0.5 MG tablet TAKE 1 TABLET 30 TO 60 MINUTES PRIOR TO MRI. MAY REPEAT ONCE FOR 1 ADDITIONAL RELIEF  0  . Melatonin 1 MG CAPS Take 2 capsules by mouth at bedtime as needed (sleep).     . mirabegron ER (MYRBETRIQ) 50 MG TB24 tablet Take 1 tablet by mouth daily.    . modafinil (PROVIGIL) 100 MG tablet Take 100 mg by mouth daily.     . ondansetron (ZOFRAN) 8 MG tablet Take 1 tab (8mg ) by mouth 1 hour before daily temozolomide. Take every 8 hours as needed for nausea.    . ondansetron (ZOFRAN) 8 MG tablet Take 1 tablet by mouth every 8 (eight) hours as needed.    . tretinoin (RETIN-A) 0.05 % cream Apply 1 application topically at bedtime as needed. spots  0  . venlafaxine XR (EFFEXOR-XR) 37.5 MG 24 hr capsule Take 1 capsule by mouth daily.  3   No current facility-administered medications on file prior to visit.     Allergies:  Allergies  Allergen Reactions  . Codeine Nausea And Vomiting and Nausea Only  . 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    . Compazine [Prochlorperazine Edisylate] Other (See Comments) and Rash    Became  hyper Became hyper   Past Medical History:  Past Medical History:  Diagnosis Date  . Cancer ([place])   . Depression   . GERD (gastroesophageal reflux disease)   . 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 Headache(784.0)   . Hiatal hernia   . History of radiation therapy 01/07/2012-02/18/12   left temporal  60GY  . HLD (hyperlipidemia)   . PONV (postoperative nausea and vomiting)   . 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 BUNIONECTOMY  2001   right  .  CATARACT EXTRACTION Left   . CRANIOTOMY  12/17/2011   Procedure: CRANIOTOMY TUMOR EXCISION;  Surgeon: Kelly Connors, MD;  Location: [facility];  Service: Neurosurgery;  Laterality: Left;  LEFT temporal craniotomy with stealth for tumor resection  . DILATION AND CURETTAGE OF UTERUS  2007  . TOTAL KNEE ARTHROPLASTY  2000   left  . VENTRAL HERNIA REPAIR N/A 09/03/2015   Procedure: LAPAROSCOPIC repair of incarcerated spigelian hernia with mesh, reduction and repair ;  Surgeon: Kelly Boston, MD;  Location: [facility];  Service: General;  Laterality: N/A;   Social History:  Social History   Socioeconomic History  . Marital status: Married    Spouse name: Not on file  . Number of children: 3  . Years of education: Not on file  . Highest education level: Not on file  Social Needs  . Financial resource strain: Not on file  . Food insecurity - worry: Not on file  . Food insecurity - inability: Not on file  . Transportation needs - medical: Not on file  . Transportation needs - non-medical: Not on file  Occupational History  . Not on file  Tobacco Use  . Smoking status: Never Smoker  . Smokeless tobacco: Never Used  Substance and Sexual Activity  . Alcohol use: No  . Drug use: No  . Sexual activity: Yes  Other Topics Concern  . Not on file  Social History Narrative   2nd marriage of 6 years, previously widowed   Family History:  Family History  Problem Relation Age of Onset  . Heart disease Mother     Review of Systems: Constitutional: Denies fevers, chills or abnormal weight loss Eyes: Denies blurriness of vision Ears, nose, mouth, throat, and face: Denies mucositis or sore throat Respiratory: Denies cough, dyspnea or wheezes Cardiovascular: Denies palpitation, chest discomfort or lower extremity swelling Gastrointestinal:  Denies nausea, constipation, diarrhea GU: Denies dysuria or incontinence Skin: Denies abnormal skin rashes Neurological: Per HPI Musculoskeletal: Denies  joint pain, back or neck discomfort. No decrease in ROM Behavioral/Psych: Denies anxiety, disturbance in thought content, and mood instability  Physical Exam: Vitals:   05/17/17 1018  BP: 135/88  Pulse: 87  Resp: 18  Temp: 98.4 F (36.9 C)  SpO2: 99%   KPS: 80. General: Alert, cooperative, pleasant, in no acute distress Head: Normal EENT: No conjunctival injection or scleral icterus. Oral mucosa moist Lungs: Resp effort normal Cardiac: Regular rate and rhythm Abdomen: Soft, non-distended abdomen Skin: No rashes cyanosis or petechiae. Extremities: No clubbing or edema  Neurologic Exam: Mental Status: Awake, alert, attentive to examiner. Oriented to self and environment. Language is fluent with intact comprehension.  Age advanced psychomotor slowing with impaired short  term memory. Cranial Nerves: Visual acuity is grossly normal. Visual fields are full. Extra-ocular movements intact. Left eye ptosis, chronic. Face is symmetric, tongue midline. Motor: Tone and bulk are normal. Power is full in both arms and legs. Reflexes are symmetric, no pathologic reflexes present. Intact finger to nose bilaterally Sensory: Intact to light touch and temperature Gait: Slow, deliberate.  Unable to tandem   Labs: I have reviewed the data as listed    Component Value Date/Time   NA 141 04/26/2017 1152   NA 140 09/11/2013 1238   K 3.7 04/26/2017 1152   K 4.7 09/11/2013 1238   CL 108 04/26/2017 1152   CL 111 (H) 08/04/2012 1008   CO2 24 04/26/2017 1152   CO2 23 09/11/2013 1238   GLUCOSE 90 04/26/2017 1152   GLUCOSE 87 09/11/2013 1238   GLUCOSE 86 08/04/2012 1008   BUN 13 04/26/2017 1152   BUN 13.3 10/29/2014 1127   CREATININE 0.70 04/26/2017 1152   CREATININE 0.8 10/29/2014 1127   CALCIUM 9.9 04/26/2017 1152   CALCIUM 9.5 09/11/2013 1238   PROT 7.0 04/26/2017 1152   PROT 6.6 09/11/2013 1238   ALBUMIN 3.9 04/26/2017 1152   ALBUMIN 3.9 09/11/2013 1238   AST 22 04/26/2017 1152   AST 19  09/11/2013 1238   ALT 29 04/26/2017 1152   ALT 27 09/11/2013 1238   ALKPHOS 101 04/26/2017 1152   ALKPHOS 106 09/11/2013 1238   BILITOT 0.9 04/26/2017 1152   BILITOT 0.72 09/11/2013 1238   GFRNONAA >60 04/26/2017 1152   GFRAA >60 04/26/2017 1152   Lab Results  Component Value Date   WBC 6.5 04/26/2017   NEUTROABS 5.5 04/26/2017   HGB 13.8 05/11/2016   HCT 46.9 (H) 04/26/2017   MCV 91.7 04/26/2017   PLT 279 04/26/2017     Assessment/Plan 1. 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 Tolliver is clinically stable, labs are within normal limits.  She is clear to undergo second Avastin infusion today.  Therapy will occur q2 weeks at the 10mg /kg dose level.    We recommended continuing decadron at 2mg  BID.    The events described by her husband may be focal seizures- we will order a trial of vimpat 50mg  BID to see if frequency changes.  Previously she could not tolerate Keppra and currently is without any AED.   For acute on chronic dyphasia, we will also place referral for speech therapy  We will communicate any updates or changes with Dr. Durenda Age office at [facility].  We appreciate the opportunity to participate in the care of Kelly Maxwell.   All questions were answered. The patient knows to call the clinic with any problems, questions or concerns. No barriers to learning were detected.  The total time spent in the encounter was 40 minutes and more than 50% was on counseling and review of test results   Ventura Sellers, MD Medical Director of Neuro-Oncology [facility] at [place] 05/17/17 10:19 AM

## 2017-05-17 NOTE — Patient Instructions (Signed)
[facility] Discharge Instructions for Patients Receiving Chemotherapy  Today you received the following chemotherapy agents Avastin  To help prevent nausea and vomiting after your treatment, we encourage you to take your nausea medication as directed   If you develop nausea and vomiting that is not controlled by your nausea medication, call the clinic.   BELOW ARE SYMPTOMS THAT SHOULD BE REPORTED IMMEDIATELY:  *FEVER GREATER THAN 100.5 F  *CHILLS WITH OR WITHOUT FEVER  NAUSEA AND VOMITING THAT IS NOT CONTROLLED WITH YOUR NAUSEA MEDICATION  *UNUSUAL SHORTNESS OF BREATH  *UNUSUAL BRUISING OR BLEEDING  TENDERNESS IN MOUTH AND THROAT WITH OR WITHOUT PRESENCE OF ULCERS  *URINARY PROBLEMS  *BOWEL PROBLEMS  UNUSUAL RASH Items with * indicate a potential emergency and should be followed up as soon as possible.  Feel free to call the clinic should you have any questions or concerns. The clinic phone number is [phone].  Please show the CHEMO ALERT CARD at check-in to the Emergency Department and triage nurse.   

## 2017-05-20 ENCOUNTER — Other Ambulatory Visit: Payer: Self-pay | Admitting: *Deleted

## 2017-05-20 ENCOUNTER — Ambulatory Visit: Payer: Medicare HMO | Admitting: Physical Therapy

## 2017-05-20 ENCOUNTER — Encounter: Payer: Self-pay | Admitting: Physical Therapy

## 2017-05-20 DIAGNOSIS — C719 Malignant neoplasm of brain, unspecified: Secondary | ICD-10-CM

## 2017-05-20 DIAGNOSIS — M6281 Muscle weakness (generalized): Secondary | ICD-10-CM | POA: Diagnosis not present

## 2017-05-20 DIAGNOSIS — R2681 Unsteadiness on feet: Secondary | ICD-10-CM | POA: Diagnosis not present

## 2017-05-20 DIAGNOSIS — R2689 Other abnormalities of gait and mobility: Secondary | ICD-10-CM | POA: Diagnosis not present

## 2017-05-20 NOTE — Therapy (Signed)
[facility] [address] [place], [place], [address] Phone: [phone]   Fax:  [phone]  Physical Therapy Treatment  Patient Details  Name: Kelly Maxwell MRN: [id number] Date of Birth: [birth year] Referring Provider: Cecil Cobbs, MD   Encounter Date: 05/20/2017  PT End of Session - 05/20/17 2024    Visit Number  3    Number of Visits  17    Date for PT Re-Evaluation  06/28/17    Authorization Type  [employer]    PT Start Time  9937    PT Stop Time  1535    PT Time Calculation (min)  44 min    Activity Tolerance  Patient limited by fatigue requested seated rest x 2    Behavior During Therapy  [facility] for tasks assessed/performed       Past Medical History:  Diagnosis Date  . Cancer ([place])   . Depression   . GERD (gastroesophageal reflux disease)   . 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 Headache(784.0)   . Hiatal hernia   . History of radiation therapy 01/07/2012-02/18/12   left temporal  60GY  . HLD (hyperlipidemia)   . PONV (postoperative nausea and vomiting)   . 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 BUNIONECTOMY  2001   right  . CATARACT EXTRACTION Left   . CRANIOTOMY  12/17/2011   Procedure: CRANIOTOMY TUMOR EXCISION;  Surgeon: Otilio Connors, MD;  Location: [facility];  Service: Neurosurgery;  Laterality: Left;  LEFT temporal craniotomy with stealth for tumor resection  . DILATION AND CURETTAGE OF UTERUS  2007  . TOTAL KNEE ARTHROPLASTY  2000   left  . VENTRAL HERNIA REPAIR N/A 09/03/2015   Procedure: LAPAROSCOPIC repair of incarcerated spigelian hernia with mesh, reduction and repair ;  Surgeon: [name], MD;  Location: [facility];  Service: General;   Laterality: N/A;    There were no vitals filed for this visit.  Subjective Assessment - 05/20/17 1453    Subjective  One fall. Walking in the rain with her daughter. States it was uneven and over she went.  husband could not add any other details. Husband reports she did her balance HEP one time since her last visit (05/07/17).     Pertinent History  Gliosarcoma, anxiety, depression, GERD, herpes left eye, hiatal hernia    Limitations  Lifting;Standing;Walking    Patient Stated Goals  To improve walking, be able to go upstairs to master bedroom & improve balance.     Currently in Pain?  No/denies          Treatment-  Therapeutic Exercise- see additions to HEP given today. All performed as prescribed. In addition, seated hip abduction with green band with stepping each leg out and in x 10 reps x 2 (pt ultimately with better form with exercise added to HEP). Step ups forward with bil UE support, each LE 5 reps. Ambulated 115 ft between exercise stations.                     PT Education - 05/20/17 2022    Education Details  Discussed importance of participation in HEP to see results. Discussed if patient has been too sick due to  cancer treatment that this may not be the right time for OPPT. see HEP;    Person(s) Educated  Patient    Methods  Explanation;Handout;Demonstration    Comprehension  Verbalized understanding;Returned demonstration;Verbal cues required;Need further instruction       PT Short Term Goals - 05/01/17 1726      PT SHORT TERM GOAL #1   Title  Patient demonstrates understanding of initial HEP. (All STGs Target Date: 05/31/2017)    Time  4    Period  Weeks    Status  New    Target Date  05/31/17      PT SHORT TERM GOAL #2   Title  Patient ambulates with scanning maintaining path & pace with supervision.     Time  4    Period  Weeks    Status  New    Target Date  05/31/17      PT SHORT TERM GOAL #3   Title  Patient negotiates 12 steps with 2  rails with supervision.     Time  4    Period  Weeks    Status  New    Target Date  05/31/17      PT SHORT TERM GOAL #4   Title  Patient reaches 10", to floor and looks over shoulders with weight shift & trunk rotation safely.     Time  4    Period  Weeks    Status  New    Target Date  05/31/17        PT Long Term Goals - 05/01/17 1721      PT LONG TERM GOAL #1   Title  Patient demonstrates understanding of ongoing HEP. (All LTGs Target Date: 06/28/2017)    Time  8    Period  Weeks    Status  New    Target Date  06/28/17      PT LONG TERM GOAL #2   Title  Berg Balance >45/56 to indicate lower fall risk.     Time  8    Period  Weeks    Status  New    Target Date  06/28/17      PT LONG TERM GOAL #3   Title  Functional Gait Assessment >/= 15/30 to indicate lower fall risk.     Time  8    Period  Weeks    Status  New    Target Date  06/28/17      PT LONG TERM GOAL #4   Title  Patient ambulates 1000' without device outdoor including ramps, curbs & grass for community mobility.    Time  8    Period  Weeks    Status  New    Target Date  06/28/17      PT LONG TERM GOAL #5   Title  Patient negoatiates stairs (16 total) with 2 rails to simulate her home with supervision.    Time  8    Period  Weeks    Status  New    Target Date  06/28/17            Plan - 05/20/17 2025    Clinical Impression Statement  Session included review of importance of HEP to see progress. Discussed if pt/husband think now is not the time to pursue PT due to illness from cancer treatment. Husband insists she has a period of a couple of hours each day when she has a "burst of energy" and can be sure  she does her exercises at that time. Attempted to determine if she feels the need to focus on strengthening or balance HEP and she wants to do both. Patient requested frequent rest breaks due to fatigue or general "not feeling well." Will continue to assess tolerance and ability to participate in  OPPT.     Rehab Potential  Good    Clinical Impairments Affecting Rehab Potential  endurance, strength    PT Frequency  2x / week    PT Duration  8 weeks    PT Treatment/Interventions  ADLs/Self Care Home Management;DME Instruction;Gait training;Stair training;Functional mobility training;Therapeutic activities;Therapeutic exercise;Balance training;Neuromuscular re-education;Patient/family education    PT Next Visit Plan  ask if doing HEP more; check understanding of exercises in HEP of 3/5 and 3/19; continue to work on endurance (? try Nustep), balance, and LE strength    PT Home Exercise Plan  corner ex on foam EO with feet together and head turns; tandem walking fwd; walking backward    Consulted and Agree with Plan of Care  Patient;Family member/caregiver    Family Member Consulted  husband       Patient will benefit from skilled therapeutic intervention in order to improve the following deficits and impairments:  Abnormal gait, Decreased activity tolerance, Cardiopulmonary status limiting activity, Decreased balance, Decreased coordination, Decreased endurance, Decreased mobility, Decreased strength  Visit Diagnosis: Muscle weakness (generalized)     Problem List Patient Active Problem List   Diagnosis Date Noted  . 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 Anxiety and depression 09/03/2015  . Personal history of other infectious and parasitic diseases 09/03/2015  . 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 HLD (hyperlipidemia)   . GERD (gastroesophageal reflux disease)   . PONV (postoperative nausea and vomiting)   . 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 Hiatal hernia     Rexanne Mano, PT 05/20/2017, 8:34 PM  [facility] [address] [place], [place], [address] Phone: [phone]   Fax:  [phone]  Name: ATALYA DANO MRN: [id number] Date of  Birth: [birth year]

## 2017-05-20 NOTE — Patient Instructions (Addendum)
Sit to Stand Transfers:  1. Scoot out to the edge of the chair 2. Place your feet flat on the floor, shoulder width apart.  Make sure your feet are tucked just under your knees. 3. Lean forward (nose over toes) with momentum, and stand up tall with your best posture.  If you need to use your arms, use them as a quick boost up to stand. 4. If you are in a low or soft chair, you can lean back and then forward up to stand, in order to get more momentum. 5. Once you are standing, make sure you are looking ahead and standing tall.  To sit down:  1. Back up until you feel the chair behind your legs. 2. Bend at you hips, reaching  Back for you chair, if needed, then slowly squat to sit down on your chair.  Functional Quadriceps: Sit to Stand    Sit on edge of chair, feet flat on floor. Place your hands on your knees. Stand upright, extending knees fully. Repeat __5__ times per set. Do __2__ sets per day.  http://orth.exer.us/735   Copyright  VHI. All rights reserved.    Heel Raises    Stand with light support of hands on the counter. With knees straight, raise heels off ground. Pause at the top and then return to standing/starting position.  Repeat __20_ times. Do _2__ times a day.  Copyright  VHI. All rights reserved.      Seated Hip Abduction with TB  Begin in a seated position with good posture. Place both feet through the green band and pull the band up over your knees.  Scoot out to front edge of seat. Slowly push your knees outward into the band. Hold one second, then return to the center.   Repeat 10 times. Rest and repeat 10 times.

## 2017-05-20 NOTE — Progress Notes (Unsigned)
am

## 2017-05-24 ENCOUNTER — Encounter: Payer: Self-pay | Admitting: Physical Therapy

## 2017-05-24 ENCOUNTER — Ambulatory Visit: Payer: Medicare HMO | Admitting: Physical Therapy

## 2017-05-24 ENCOUNTER — Telehealth: Payer: Self-pay | Admitting: Physical Therapy

## 2017-05-24 DIAGNOSIS — R2689 Other abnormalities of gait and mobility: Secondary | ICD-10-CM | POA: Diagnosis not present

## 2017-05-24 DIAGNOSIS — M6281 Muscle weakness (generalized): Secondary | ICD-10-CM | POA: Diagnosis not present

## 2017-05-24 DIAGNOSIS — R2681 Unsteadiness on feet: Secondary | ICD-10-CM | POA: Diagnosis not present

## 2017-05-24 NOTE — Telephone Encounter (Signed)
Dr. Mickeal Skinner,  Thank you for referring Mrs. Lorenz Coaster for outpatient Physical Therapy. Despite her best efforts, she is not tolerating this level of care.   When she arrives for her appointment, she is already fatigued from the effort to get here. Today she requires at least 4 rest breaks and 2 trips to the restroom during a 40 minute session. She is very distracted working in the gym environment and requires max instructional cues to continue completing mulitple repetitions of the same exercise.   She and husband want desperately for her to continue Physical Therapy. I told them that she would be better served by [facility] and they were agreeable to this plan, if you also agree. They had no preference for a Home Health agency should you choose to make the referral.   Again, thank you for referring her to Outpatient Neurorehabilitation. Please do not hesitate to ask any questions you may have.    Barry Brunner, PT Outpatient Neurorehabilitation [address] [place], [place] [address] [phone]

## 2017-05-24 NOTE — Therapy (Signed)
[facility] [address] [place], [place], [address] Phone: [phone]   Fax:  [phone]  Physical Therapy Treatment  Patient Details  Name: Kelly Maxwell MRN: [id number] Date of Birth: July 03, 1945 Referring Provider: Cecil Cobbs, MD   Encounter Date: 05/24/2017  PT End of Session - 05/24/17 1523    Visit Number  4    Number of Visits  17    Date for PT Re-Evaluation  06/28/17    Authorization Type  A[employer]na Medicare    PT Start Time  1446    PT Stop Time  1527    PT Time Calculation (min)  41 min    Activity Tolerance  Patient limited by fatigue requested seated rest x 4    Behavior During Therapy  [facility] for tasks assessed/performed       Past Medical History:  Diagnosis Date  . Cancer ([place])   . Depression   . GERD (gastroesophageal reflux disease)   . 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 Headache(784.0)   . Hiatal hernia   . History of radiation therapy 01/07/2012-02/18/12   left temporal  60GY  . HLD (hyperlipidemia)   . PONV (postoperative nausea and vomiting)   . 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 BUNIONECTOMY  2001   right  . CATARACT EXTRACTION Left   . CRANIOTOMY  12/17/2011   Procedure: CRANIOTOMY TUMOR EXCISION;  Surgeon: Otilio Connors, MD;  Location: [facility];  Service: Neurosurgery;  Laterality: Left;  LEFT temporal craniotomy with stealth for tumor resection  . DILATION AND CURETTAGE OF UTERUS  2007  . TOTAL KNEE ARTHROPLASTY  2000   left  . VENTRAL HERNIA REPAIR N/A 09/03/2015   Procedure: LAPAROSCOPIC repair of incarcerated spigelian hernia with mesh, reduction and repair ;  Surgeon: [name], MD;  Location: [facility];  Service: General;   Laterality: N/A;    There were no vitals filed for this visit.  Subjective Assessment - 05/24/17 1440    Subjective  Haven't done much exercises at home. We've had other things going on.     Pertinent History  Gliosarcoma, anxiety, depression, GERD, herpes left eye, hiatal hernia    Limitations  Lifting;Standing;Walking    Patient Stated Goals  To improve walking, be able to go upstairs to master bedroom & improve balance.     Currently in Pain?  No/denies                       [facility] Adult PT Treatment/Exercise - 05/24/17 0001      Transfers   Transfers  Sit to Stand;Stand to Sit    Sit to Stand  4: Min assist;Without upper extremity assist;From chair/3-in-1    Stand to Sit  5: Supervision    Number of Reps  2 sets 5 reps; second set with reaching fwd to chair in front of he      Exercises   Exercises  Knee/Hip      Knee/Hip Exercises: Aerobic   Nustep  2 minute intervals with 2 minute rest x 3; L2          Balance Exercises - 05/24/17 1519      Balance Exercises: Standing  Standing Eyes Closed  Narrow base of support (BOS);Foam/compliant surface;2 reps;30 secs    Tandem Gait  Forward;Upper extremity support;4 reps    Retro Gait  Upper extremity support;4 reps        PT Education - 05/24/17 1522    Education Details  reviewed HEP and pt needs constant cues    Person(s) Educated  Patient    Methods  Explanation;Demonstration;Handout    Comprehension  Returned demonstration;Verbal cues required;Need further instruction       PT Short Term Goals - 05/01/17 1726      PT SHORT TERM GOAL #1   Title  Patient demonstrates understanding of initial HEP. (All STGs Target Date: 05/31/2017)    Time  4    Period  Weeks    Status  New    Target Date  05/31/17      PT SHORT TERM GOAL #2   Title  Patient ambulates with scanning maintaining path & pace with supervision.     Time  4    Period  Weeks    Status  New    Target Date  05/31/17      PT SHORT  TERM GOAL #3   Title  Patient negotiates 12 steps with 2 rails with supervision.     Time  4    Period  Weeks    Status  New    Target Date  05/31/17      PT SHORT TERM GOAL #4   Title  Patient reaches 10", to floor and looks over shoulders with weight shift & trunk rotation safely.     Time  4    Period  Weeks    Status  New    Target Date  05/31/17        PT Long Term Goals - 05/01/17 1721      PT LONG TERM GOAL #1   Title  Patient demonstrates understanding of ongoing HEP. (All LTGs Target Date: 06/28/2017)    Time  8    Period  Weeks    Status  New    Target Date  06/28/17      PT LONG TERM GOAL #2   Title  Berg Balance >45/56 to indicate lower fall risk.     Time  8    Period  Weeks    Status  New    Target Date  06/28/17      PT LONG TERM GOAL #3   Title  Functional Gait Assessment >/= 15/30 to indicate lower fall risk.     Time  8    Period  Weeks    Status  New    Target Date  06/28/17      PT LONG TERM GOAL #4   Title  Patient ambulates 1000' without device outdoor including ramps, curbs & grass for community mobility.    Time  8    Period  Weeks    Status  New    Target Date  06/28/17      PT LONG TERM GOAL #5   Title  Patient negoatiates stairs (16 total) with 2 rails to simulate her home with supervision.    Time  8    Period  Weeks    Status  New    Target Date  06/28/17            Plan - 05/24/17 1556    Clinical Impression Statement  Patient again struggling to participate fully in 40-45 minute session.  She requested at least 4 seated rest periods and two restroom breaks. She struggles cognitively and is easily distracted (internally and by others in the gym). When she is between repetitions she at times needs instructions as to how to continue/repeat the same exercise over again. At end of session discussed with patient and husband that she would be better served by HHPT and they both agree with this plan. Will contact referring physician  to make the recommendation for referral to [facility]. (Pt/husband had no preference for agency to be used).     Rehab Potential  Good    Clinical Impairments Affecting Rehab Potential  endurance, strength    PT Frequency  2x / week    PT Duration  8 weeks    PT Treatment/Interventions  ADLs/Self Care Home Management;DME Instruction;Gait training;Stair training;Functional mobility training;Therapeutic activities;Therapeutic exercise;Balance training;Neuromuscular re-education;Patient/family education    PT Next Visit Plan  anticipate d/c to HHPT    PT Home Exercise Plan  corner ex on foam EO with feet together and head turns; tandem walking fwd; walking backward    Consulted and Agree with Plan of Care  Patient;Family member/caregiver    Family Member Consulted  husband       Patient will benefit from skilled therapeutic intervention in order to improve the following deficits and impairments:  Abnormal gait, Decreased activity tolerance, Cardiopulmonary status limiting activity, Decreased balance, Decreased coordination, Decreased endurance, Decreased mobility, Decreased strength  Visit Diagnosis: Muscle weakness (generalized)  Unsteadiness on feet     Problem List Patient Active Problem List   Diagnosis Date Noted  . 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 Anxiety and depression 09/03/2015  . Personal history of other infectious and parasitic diseases 09/03/2015  . 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 HLD (hyperlipidemia)   . GERD (gastroesophageal reflux disease)   . PONV (postoperative nausea and vomiting)   . 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 Hiatal hernia     Rexanne Mano, PT 05/24/2017, 4:05 PM  [facility] [address] [place], [place], [address] Phone: [phone]   Fax:  [phone]  Name: Kelly Maxwell MRN: [id number] Date of Birth:  02-24-1946

## 2017-05-27 ENCOUNTER — Other Ambulatory Visit: Payer: Self-pay | Admitting: *Deleted

## 2017-05-27 DIAGNOSIS — R350 Frequency of micturition: Secondary | ICD-10-CM | POA: Diagnosis not present

## 2017-05-27 DIAGNOSIS — C719 Malignant neoplasm of brain, unspecified: Secondary | ICD-10-CM

## 2017-05-28 ENCOUNTER — Ambulatory Visit: Payer: Medicare HMO | Admitting: Physical Therapy

## 2017-05-28 ENCOUNTER — Other Ambulatory Visit: Payer: Self-pay | Admitting: *Deleted

## 2017-05-30 ENCOUNTER — Ambulatory Visit: Payer: Medicare HMO | Admitting: Physical Therapy

## 2017-05-30 DIAGNOSIS — R413 Other amnesia: Secondary | ICD-10-CM | POA: Diagnosis not present

## 2017-05-30 DIAGNOSIS — K219 Gastro-esophageal reflux disease without esophagitis: Secondary | ICD-10-CM | POA: Diagnosis not present

## 2017-05-30 DIAGNOSIS — R69 Illness, unspecified: Secondary | ICD-10-CM | POA: Diagnosis not present

## 2017-05-30 DIAGNOSIS — C712 Malignant neoplasm of temporal lobe: Secondary | ICD-10-CM | POA: Diagnosis not present

## 2017-05-30 DIAGNOSIS — R4701 Aphasia: Secondary | ICD-10-CM | POA: Diagnosis not present

## 2017-05-30 DIAGNOSIS — R2689 Other abnormalities of gait and mobility: Secondary | ICD-10-CM | POA: Diagnosis not present

## 2017-05-30 DIAGNOSIS — E785 Hyperlipidemia, unspecified: Secondary | ICD-10-CM | POA: Diagnosis not present

## 2017-05-30 DIAGNOSIS — Z96652 Presence of left artificial knee joint: Secondary | ICD-10-CM | POA: Diagnosis not present

## 2017-05-30 DIAGNOSIS — R4702 Dysphasia: Secondary | ICD-10-CM | POA: Diagnosis not present

## 2017-05-31 ENCOUNTER — Telehealth: Payer: Self-pay | Admitting: Internal Medicine

## 2017-05-31 ENCOUNTER — Other Ambulatory Visit: Payer: Self-pay | Admitting: Internal Medicine

## 2017-05-31 ENCOUNTER — Telehealth: Payer: Self-pay | Admitting: *Deleted

## 2017-05-31 ENCOUNTER — Inpatient Hospital Stay: Payer: Medicare HMO

## 2017-05-31 ENCOUNTER — Inpatient Hospital Stay (HOSPITAL_BASED_OUTPATIENT_CLINIC_OR_DEPARTMENT_OTHER): Payer: Medicare HMO | Admitting: Internal Medicine

## 2017-05-31 VITALS — BP 146/89 | HR 88 | Temp 98.0°F | Resp 18 | Ht 66.0 in | Wt 147.8 lb

## 2017-05-31 DIAGNOSIS — Z79899 Other long term (current) drug therapy: Secondary | ICD-10-CM

## 2017-05-31 DIAGNOSIS — C719 Malignant neoplasm of brain, unspecified: Secondary | ICD-10-CM

## 2017-05-31 DIAGNOSIS — Z9221 Personal history of antineoplastic chemotherapy: Secondary | ICD-10-CM

## 2017-05-31 DIAGNOSIS — K219 Gastro-esophageal reflux disease without esophagitis: Secondary | ICD-10-CM

## 2017-05-31 DIAGNOSIS — E785 Hyperlipidemia, unspecified: Secondary | ICD-10-CM | POA: Diagnosis not present

## 2017-05-31 DIAGNOSIS — Z5112 Encounter for antineoplastic immunotherapy: Secondary | ICD-10-CM | POA: Diagnosis not present

## 2017-05-31 DIAGNOSIS — Z923 Personal history of irradiation: Secondary | ICD-10-CM

## 2017-05-31 LAB — TOTAL PROTEIN, URINE DIPSTICK: Protein, ur: NEGATIVE mg/dL

## 2017-05-31 MED ORDER — SODIUM CHLORIDE 0.9 % IV SOLN: Freq: Once | INTRAVENOUS | Status: DC

## 2017-05-31 MED ORDER — SODIUM CHLORIDE 0.9 % IV SOLN
10.0000 mg/kg | Freq: Once | INTRAVENOUS | Status: AC
  Administered 2017-05-31: 700 mg via INTRAVENOUS
  Filled 2017-05-31: qty 16

## 2017-05-31 MED ORDER — DEXAMETHASONE 2 MG PO TABS: 2.0000 mg | ORAL_TABLET | Freq: Two times a day (BID) | ORAL | 2 refills | Status: DC

## 2017-05-31 NOTE — Patient Instructions (Signed)
[facility] Discharge Instructions for Patients Receiving Chemotherapy  Today you received the following chemotherapy agents Avastin  To help prevent nausea and vomiting after your treatment, we encourage you to take your nausea medication as directed   If you develop nausea and vomiting that is not controlled by your nausea medication, call the clinic.   BELOW ARE SYMPTOMS THAT SHOULD BE REPORTED IMMEDIATELY:  *FEVER GREATER THAN 100.5 F  *CHILLS WITH OR WITHOUT FEVER  NAUSEA AND VOMITING THAT IS NOT CONTROLLED WITH YOUR NAUSEA MEDICATION  *UNUSUAL SHORTNESS OF BREATH  *UNUSUAL BRUISING OR BLEEDING  TENDERNESS IN MOUTH AND THROAT WITH OR WITHOUT PRESENCE OF ULCERS  *URINARY PROBLEMS  *BOWEL PROBLEMS  UNUSUAL RASH Items with * indicate a potential emergency and should be followed up as soon as possible.  Feel free to call the clinic should you have any questions or concerns. The clinic phone number is [phone].  Please show the CHEMO ALERT CARD at check-in to the Emergency Department and triage nurse.   

## 2017-05-31 NOTE — Telephone Encounter (Signed)
Appointments scheduled AVS/Calendar printed per 3/29 los °

## 2017-05-31 NOTE — Progress Notes (Signed)
[facility] at [place] [place], [place] [address] [phone]   Interval Evaluation  Date of Service: 05/31/17 Patient Name: Kelly Maxwell Patient MRN: [id number] Patient DOB: 1945-05-12 Provider: Ventura Sellers, MD  Identifying Statement:  Kelly Maxwell is a 72 y.o. 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  Path demonstrates Gliosarcoma       - 02/18/2012 Radiation Therapy    Completes RT and concurrent Temodar with Dr. Tammi Klippel       - 09/14/2013 Chemotherapy    Completes 18 cycles of adjuvant Temodar, treated at [place].       09/29/2015 Progression    Progression #1.  Starts metronomic temodar 50mg /m2 daily       - 09/26/2016 Chemotherapy    Completes 1 year metronomic temodar       04/19/2017 Progression    Progression #2.  Recommendation for Avastin 10mg /kg q2 weeks       Interval History:  Kelly Maxwell presents today for thirdAvastin infusion. She denies new or progressive neurologic deficits.  No seizures since starting Vimpat. Still on 2mg  BID decadron.  No convulsive seizures or headaches.  Medications: Current Outpatient Medications on File Prior to Visit  Medication Sig Dispense Refill  . amphetamine-dextroamphetamine (ADDERALL) 30 MG tablet Take 30 mg by mouth daily.     . Bromfenac Sodium (PROLENSA) 0.07 % SOLN Place 1 drop into the right eye 3 (three) times daily. Reported on 09/02/2015    . clonazePAM (KLONOPIN) 0.5 MG tablet Take 0.5 mg by mouth at bedtime as needed for anxiety.    Marland Kitchen dexamethasone (DECADRON) 2 MG tablet Take 2 mg by mouth 2 (two) times daily.  2  . Difluprednate (DUREZOL) 0.05 % EMUL Apply 1 drop to eye 4 (four) times daily.    Marland Kitchen lacosamide (VIMPAT) 50 MG TABS tablet Take 1 tablet (50 mg  total) by mouth 2 (two) times daily. 60 tablet 0  . LORazepam (ATIVAN) 0.5 MG tablet TAKE 1 TABLET 30 TO 60 MINUTES PRIOR TO MRI. MAY REPEAT ONCE FOR 1 ADDITIONAL RELIEF  0  . Melatonin 1 MG CAPS Take 2 capsules by mouth at bedtime as needed (sleep).     . ondansetron (ZOFRAN) 8 MG tablet Take 1 tab (8mg ) by mouth 1 hour before daily temozolomide. Take every 8 hours as needed for nausea.    . ondansetron (ZOFRAN) 8 MG tablet Take 1 tablet by mouth every 8 (eight) hours as needed.    Marland Kitchen oxybutynin (DITROPAN-XL) 10 MG 24 hr tablet     . tretinoin (RETIN-A) 0.05 % cream Apply 1 application topically at bedtime as needed. spots  0  . venlafaxine XR (EFFEXOR-XR) 37.5 MG 24 hr capsule Take 1 capsule by mouth daily.  3   No current facility-administered medications on file prior to visit.     Allergies:  Allergies  Allergen Reactions  . Codeine Nausea And Vomiting and Nausea Only  . 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    . Compazine [Prochlorperazine Edisylate] Other (See Comments) and Rash    Became hyper Became hyper   Past Medical History:  Past Medical History:  Diagnosis Date  .  Cancer ([place])   . Depression   . GERD (gastroesophageal reflux disease)   . Glial neoplasm of brain ([place]) 12/17/11   left temporal, grade IV, 2.7 x 3.6 x 2.5 cm ring enhancing; numerous small surrounding satellite lesions 12/13/11  . Headache(784.0)   . Hiatal hernia   . History of radiation therapy 01/07/2012-02/18/12   left temporal  60GY  . HLD (hyperlipidemia)   . PONV (postoperative nausea and vomiting)   . 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 BUNIONECTOMY  2001   right  . CATARACT EXTRACTION Left   . CRANIOTOMY  12/17/2011   Procedure: CRANIOTOMY TUMOR EXCISION;  Surgeon:  Otilio Connors, MD;  Location: [facility];  Service: Neurosurgery;  Laterality: Left;  LEFT temporal craniotomy with stealth for tumor resection  . DILATION AND CURETTAGE OF UTERUS  2007  . TOTAL KNEE ARTHROPLASTY  2000   left  . VENTRAL HERNIA REPAIR N/A 09/03/2015   Procedure: LAPAROSCOPIC repair of incarcerated spigelian hernia with mesh, reduction and repair ;  Surgeon: [name], MD;  Location: [facility];  Service: General;  Laterality: N/A;   Social History:  Social History   Socioeconomic History  . Marital status: Married    Spouse name: Not on file  . Number of children: 3  . Years of education: Not on file  . Highest education level: Not on file  Occupational History  . Not on file  Social Needs  . Financial resource strain: Not on file  . Food insecurity:    Worry: Not on file    Inability: Not on file  . Transportation needs:    Medical: Not on file    Non-medical: Not on file  Tobacco Use  . Smoking status: Never Smoker  . Smokeless tobacco: Never Used  Substance and Sexual Activity  . Alcohol use: No  . Drug use: No  . Sexual activity: Yes  Lifestyle  . Physical activity:    Days per week: Not on file    Minutes per session: Not on file  . Stress: Not on file  Relationships  . Social connections:    Talks on phone: Not on file    Gets together: Not on file    Attends religious service: Not on file    Active member of club or organization: Not on file    Attends meetings of clubs or organizations: Not on file    Relationship status: Not on file  . Intimate partner violence:    Fear of current or ex partner: Not on file    Emotionally abused: Not on file    Physically abused: Not on file    Forced sexual activity: Not on file  Other Topics Concern  . Not on file  Social History Narrative   2nd marriage of 6 years, previously widowed   Family History:  Family History  Problem Relation Age of Onset  . Heart disease Mother     Review of  Systems: Constitutional: Denies fevers, chills or abnormal weight loss Eyes: Denies blurriness of vision Ears, nose, mouth, throat, and face: Denies mucositis or sore throat Respiratory: Denies cough, dyspnea or wheezes Cardiovascular: Denies palpitation, chest discomfort or lower extremity swelling Gastrointestinal:  Denies nausea, constipation, diarrhea GU: Denies dysuria or incontinence Skin: Denies abnormal skin rashes Neurological: Per HPI Musculoskeletal: Denies joint pain, back or  neck discomfort. No decrease in ROM Behavioral/Psych: Denies anxiety, disturbance in thought content, and mood instability  Physical Exam: Vitals:   05/31/17 1257  BP: (!) 146/89  Pulse: 88  Resp: 18  Temp: 98 F (36.7 C)  SpO2: 96%   KPS: 80. General: Alert, cooperative, pleasant, in no acute distress Head: Normal EENT: No conjunctival injection or scleral icterus. Oral mucosa moist Lungs: Resp effort normal Cardiac: Regular rate and rhythm Abdomen: Soft, non-distended abdomen Skin: No rashes cyanosis or petechiae. Extremities: No clubbing or edema  Neurologic Exam: Mental Status: Awake, alert, attentive to examiner. Oriented to self and environment. Language is fluent with intact comprehension.  Age advanced psychomotor slowing with impaired short term memory. Cranial Nerves: Visual acuity is grossly normal. Visual fields are full. Extra-ocular movements intact. Left eye ptosis, chronic. Face is symmetric, tongue midline. Motor: Tone and bulk are normal. Power is full in both arms and legs. Reflexes are symmetric, no pathologic reflexes present. Intact finger to nose bilaterally Sensory: Intact to light touch and temperature Gait: Slow, deliberate.  Unable to tandem   Labs: I have reviewed the data as listed    Component Value Date/Time   NA 140 05/17/2017 1002   NA 140 09/11/2013 1238   K 3.2 (L) 05/17/2017 1002   K 4.7 09/11/2013 1238   CL 107 05/17/2017 1002   CL 111 (H)  08/04/2012 1008   CO2 22 05/17/2017 1002   CO2 23 09/11/2013 1238   GLUCOSE 74 05/17/2017 1002   GLUCOSE 87 09/11/2013 1238   GLUCOSE 86 08/04/2012 1008   BUN 17 05/17/2017 1002   BUN 13.3 10/29/2014 1127   CREATININE 0.64 05/17/2017 1002   CREATININE 0.8 10/29/2014 1127   CALCIUM 10.2 05/17/2017 1002   CALCIUM 9.5 09/11/2013 1238   PROT 7.1 05/17/2017 1002   PROT 6.6 09/11/2013 1238   ALBUMIN 3.9 05/17/2017 1002   ALBUMIN 3.9 09/11/2013 1238   AST 15 05/17/2017 1002   AST 19 09/11/2013 1238   ALT 21 05/17/2017 1002   ALT 27 09/11/2013 1238   ALKPHOS 104 05/17/2017 1002   ALKPHOS 106 09/11/2013 1238   BILITOT 0.8 05/17/2017 1002   BILITOT 0.72 09/11/2013 1238   GFRNONAA >60 05/17/2017 1002   GFRAA >60 05/17/2017 1002   Lab Results  Component Value Date   WBC 7.7 05/17/2017   NEUTROABS 6.5 05/17/2017   HGB 13.8 05/11/2016   HCT 45.3 05/17/2017   MCV 92.4 05/17/2017   PLT 156 05/17/2017     Assessment/Plan 1. 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 Merida is clinically stable, labs are within normal limits.  She is clear to undergo Avastin infusion today.  Therapy will occur q2 weeks at the 10mg /kg dose level.    We recommended continuing decadron at 2mg  BID.    Can continue Vimpat and speech therapy.  We will communicate any updates or changes with Dr. Durenda Age office at [facility].    We appreciate the opportunity to participate in the care of Kelly Maxwell.   All questions were answered. The patient knows to call the clinic with any problems, questions or concerns. No barriers to learning were detected.  The total time spent in the encounter was 40 minutes and more than 50% was on counseling and review of test results   Ventura Sellers, MD Medical Director of Neuro-Oncology [facility] 05/31/17 12:30 PM

## 2017-05-31 NOTE — Telephone Encounter (Signed)
Received vm call from Dorette Grate Speech Patholigist/[facility] from 05/30/17 stating that she has completed admission for [facility] & recommends working with pt twice week x 1 then weekly x 3 wks for expressive aphasia & needs verbal OK from Dr Mickeal Skinner. She was also concerned about warning on 3 meds that popped up -three amphetimines  & wants to know if Dr has any parameters on BP.  05/30/17 BP was 142/99 & pt/family attributed to avastin.  Discussed with Dr Mickeal Skinner & OK for Speech pathology recommendations per Bryson Ha, no change in meds, & no BP parameters but should call if BP extremely high & symptomatic. These orders were given to [facility].

## 2017-06-02 DIAGNOSIS — E785 Hyperlipidemia, unspecified: Secondary | ICD-10-CM | POA: Diagnosis not present

## 2017-06-02 DIAGNOSIS — Z96652 Presence of left artificial knee joint: Secondary | ICD-10-CM | POA: Diagnosis not present

## 2017-06-02 DIAGNOSIS — C712 Malignant neoplasm of temporal lobe: Secondary | ICD-10-CM | POA: Diagnosis not present

## 2017-06-02 DIAGNOSIS — R4702 Dysphasia: Secondary | ICD-10-CM | POA: Diagnosis not present

## 2017-06-02 DIAGNOSIS — K219 Gastro-esophageal reflux disease without esophagitis: Secondary | ICD-10-CM | POA: Diagnosis not present

## 2017-06-02 DIAGNOSIS — R69 Illness, unspecified: Secondary | ICD-10-CM | POA: Diagnosis not present

## 2017-06-02 DIAGNOSIS — R413 Other amnesia: Secondary | ICD-10-CM | POA: Diagnosis not present

## 2017-06-02 DIAGNOSIS — R4701 Aphasia: Secondary | ICD-10-CM | POA: Diagnosis not present

## 2017-06-02 DIAGNOSIS — R2689 Other abnormalities of gait and mobility: Secondary | ICD-10-CM | POA: Diagnosis not present

## 2017-06-03 ENCOUNTER — Encounter: Payer: Self-pay | Admitting: Pharmacy Technician

## 2017-06-03 NOTE — Progress Notes (Signed)
The patient is approved for drug assistance by [facility] for Avastin from 05/30/17 - 05/30/17. Enrollment is based on excessive OOP. Drug replacement will be requested for DOS beginning  04/26/17 and subsequent DOS's.

## 2017-06-04 ENCOUNTER — Ambulatory Visit: Payer: Medicare HMO | Admitting: Physical Therapy

## 2017-06-04 DIAGNOSIS — R413 Other amnesia: Secondary | ICD-10-CM | POA: Diagnosis not present

## 2017-06-04 DIAGNOSIS — C712 Malignant neoplasm of temporal lobe: Secondary | ICD-10-CM | POA: Diagnosis not present

## 2017-06-04 DIAGNOSIS — R4701 Aphasia: Secondary | ICD-10-CM | POA: Diagnosis not present

## 2017-06-04 DIAGNOSIS — E785 Hyperlipidemia, unspecified: Secondary | ICD-10-CM | POA: Diagnosis not present

## 2017-06-04 DIAGNOSIS — Z96652 Presence of left artificial knee joint: Secondary | ICD-10-CM | POA: Diagnosis not present

## 2017-06-04 DIAGNOSIS — R69 Illness, unspecified: Secondary | ICD-10-CM | POA: Diagnosis not present

## 2017-06-04 DIAGNOSIS — K219 Gastro-esophageal reflux disease without esophagitis: Secondary | ICD-10-CM | POA: Diagnosis not present

## 2017-06-04 DIAGNOSIS — R2689 Other abnormalities of gait and mobility: Secondary | ICD-10-CM | POA: Diagnosis not present

## 2017-06-04 DIAGNOSIS — R4702 Dysphasia: Secondary | ICD-10-CM | POA: Diagnosis not present

## 2017-06-06 ENCOUNTER — Ambulatory Visit: Payer: Medicare HMO | Admitting: Physical Therapy

## 2017-06-06 DIAGNOSIS — R69 Illness, unspecified: Secondary | ICD-10-CM | POA: Diagnosis not present

## 2017-06-06 DIAGNOSIS — E785 Hyperlipidemia, unspecified: Secondary | ICD-10-CM | POA: Diagnosis not present

## 2017-06-06 DIAGNOSIS — R2689 Other abnormalities of gait and mobility: Secondary | ICD-10-CM | POA: Diagnosis not present

## 2017-06-06 DIAGNOSIS — R4702 Dysphasia: Secondary | ICD-10-CM | POA: Diagnosis not present

## 2017-06-06 DIAGNOSIS — C712 Malignant neoplasm of temporal lobe: Secondary | ICD-10-CM | POA: Diagnosis not present

## 2017-06-06 DIAGNOSIS — R4701 Aphasia: Secondary | ICD-10-CM | POA: Diagnosis not present

## 2017-06-06 DIAGNOSIS — K219 Gastro-esophageal reflux disease without esophagitis: Secondary | ICD-10-CM | POA: Diagnosis not present

## 2017-06-06 DIAGNOSIS — Z96652 Presence of left artificial knee joint: Secondary | ICD-10-CM | POA: Diagnosis not present

## 2017-06-06 DIAGNOSIS — R413 Other amnesia: Secondary | ICD-10-CM | POA: Diagnosis not present

## 2017-06-10 DIAGNOSIS — E785 Hyperlipidemia, unspecified: Secondary | ICD-10-CM | POA: Diagnosis not present

## 2017-06-10 DIAGNOSIS — K219 Gastro-esophageal reflux disease without esophagitis: Secondary | ICD-10-CM | POA: Diagnosis not present

## 2017-06-10 DIAGNOSIS — R4701 Aphasia: Secondary | ICD-10-CM | POA: Diagnosis not present

## 2017-06-10 DIAGNOSIS — R2689 Other abnormalities of gait and mobility: Secondary | ICD-10-CM | POA: Diagnosis not present

## 2017-06-10 DIAGNOSIS — R69 Illness, unspecified: Secondary | ICD-10-CM | POA: Diagnosis not present

## 2017-06-10 DIAGNOSIS — R413 Other amnesia: Secondary | ICD-10-CM | POA: Diagnosis not present

## 2017-06-10 DIAGNOSIS — R4702 Dysphasia: Secondary | ICD-10-CM | POA: Diagnosis not present

## 2017-06-10 DIAGNOSIS — Z96652 Presence of left artificial knee joint: Secondary | ICD-10-CM | POA: Diagnosis not present

## 2017-06-10 DIAGNOSIS — C712 Malignant neoplasm of temporal lobe: Secondary | ICD-10-CM | POA: Diagnosis not present

## 2017-06-11 ENCOUNTER — Ambulatory Visit: Payer: Medicare HMO | Admitting: Physical Therapy

## 2017-06-13 ENCOUNTER — Ambulatory Visit: Payer: Medicare HMO | Admitting: Physical Therapy

## 2017-06-13 ENCOUNTER — Ambulatory Visit: Payer: Medicare HMO | Admitting: Speech Pathology

## 2017-06-13 DIAGNOSIS — Z96652 Presence of left artificial knee joint: Secondary | ICD-10-CM | POA: Diagnosis not present

## 2017-06-13 DIAGNOSIS — R4701 Aphasia: Secondary | ICD-10-CM | POA: Diagnosis not present

## 2017-06-13 DIAGNOSIS — C712 Malignant neoplasm of temporal lobe: Secondary | ICD-10-CM | POA: Diagnosis not present

## 2017-06-13 DIAGNOSIS — R4702 Dysphasia: Secondary | ICD-10-CM | POA: Diagnosis not present

## 2017-06-13 DIAGNOSIS — R2689 Other abnormalities of gait and mobility: Secondary | ICD-10-CM | POA: Diagnosis not present

## 2017-06-13 DIAGNOSIS — R69 Illness, unspecified: Secondary | ICD-10-CM | POA: Diagnosis not present

## 2017-06-13 DIAGNOSIS — K219 Gastro-esophageal reflux disease without esophagitis: Secondary | ICD-10-CM | POA: Diagnosis not present

## 2017-06-13 DIAGNOSIS — E785 Hyperlipidemia, unspecified: Secondary | ICD-10-CM | POA: Diagnosis not present

## 2017-06-13 DIAGNOSIS — R413 Other amnesia: Secondary | ICD-10-CM | POA: Diagnosis not present

## 2017-06-14 ENCOUNTER — Encounter: Payer: Self-pay | Admitting: Internal Medicine

## 2017-06-14 ENCOUNTER — Ambulatory Visit: Payer: Medicare HMO | Admitting: Hematology

## 2017-06-14 ENCOUNTER — Inpatient Hospital Stay: Payer: Medicare HMO | Attending: Internal Medicine

## 2017-06-14 ENCOUNTER — Other Ambulatory Visit: Payer: Medicare HMO

## 2017-06-14 ENCOUNTER — Inpatient Hospital Stay (HOSPITAL_BASED_OUTPATIENT_CLINIC_OR_DEPARTMENT_OTHER): Payer: Medicare HMO | Admitting: Internal Medicine

## 2017-06-14 ENCOUNTER — Inpatient Hospital Stay: Payer: Medicare HMO

## 2017-06-14 VITALS — BP 139/95 | HR 105 | Temp 98.0°F | Resp 17 | Ht 66.0 in | Wt 147.5 lb

## 2017-06-14 VITALS — BP 131/87 | HR 69 | Resp 16

## 2017-06-14 DIAGNOSIS — R490 Dysphonia: Secondary | ICD-10-CM | POA: Insufficient documentation

## 2017-06-14 DIAGNOSIS — E785 Hyperlipidemia, unspecified: Secondary | ICD-10-CM | POA: Diagnosis not present

## 2017-06-14 DIAGNOSIS — K219 Gastro-esophageal reflux disease without esophagitis: Secondary | ICD-10-CM

## 2017-06-14 DIAGNOSIS — Z87440 Personal history of urinary (tract) infections: Secondary | ICD-10-CM | POA: Insufficient documentation

## 2017-06-14 DIAGNOSIS — C719 Malignant neoplasm of brain, unspecified: Secondary | ICD-10-CM

## 2017-06-14 DIAGNOSIS — Z9221 Personal history of antineoplastic chemotherapy: Secondary | ICD-10-CM | POA: Insufficient documentation

## 2017-06-14 DIAGNOSIS — C712 Malignant neoplasm of temporal lobe: Secondary | ICD-10-CM | POA: Diagnosis not present

## 2017-06-14 DIAGNOSIS — Z923 Personal history of irradiation: Secondary | ICD-10-CM | POA: Insufficient documentation

## 2017-06-14 DIAGNOSIS — R5383 Other fatigue: Secondary | ICD-10-CM | POA: Diagnosis not present

## 2017-06-14 DIAGNOSIS — R531 Weakness: Secondary | ICD-10-CM | POA: Insufficient documentation

## 2017-06-14 DIAGNOSIS — Z79899 Other long term (current) drug therapy: Secondary | ICD-10-CM

## 2017-06-14 DIAGNOSIS — Z5112 Encounter for antineoplastic immunotherapy: Secondary | ICD-10-CM | POA: Insufficient documentation

## 2017-06-14 LAB — CMP (CANCER CENTER ONLY)
ALBUMIN: 3.3 g/dL — AB (ref 3.5–5.0)
ALT: 27 U/L (ref 0–55)
ANION GAP: 8 (ref 3–11)
AST: 13 U/L (ref 5–34)
Alkaline Phosphatase: 75 U/L (ref 40–150)
BILIRUBIN TOTAL: 0.6 mg/dL (ref 0.2–1.2)
BUN: 16 mg/dL (ref 7–26)
CHLORIDE: 106 mmol/L (ref 98–109)
CO2: 22 mmol/L (ref 22–29)
Calcium: 9.5 mg/dL (ref 8.4–10.4)
Creatinine: 0.63 mg/dL (ref 0.60–1.10)
GFR, Est AFR Am: >60 mL/min (ref >60)
GFR, Estimated: >60 mL/min (ref >60)
GLUCOSE: 102 mg/dL (ref 70–140)
Potassium: 4 mmol/L (ref 3.5–5.1)
Sodium: 136 mmol/L (ref 136–145)
TOTAL PROTEIN: 6.2 g/dL — AB (ref 6.4–8.3)

## 2017-06-14 LAB — TSH: TSH: 1.495 u[IU]/mL (ref 0.308–3.960)

## 2017-06-14 LAB — CBC (CANCER CENTER ONLY)
HEMATOCRIT: 45.5 % (ref 34.8–46.6)
HEMOGLOBIN: 15.3 g/dL (ref 11.6–15.9)
MCH: 31.6 pg (ref 25.1–34.0)
MCHC: 33.6 g/dL (ref 31.5–36.0)
MCV: 94 fL (ref 79.5–101.0)
Platelet Count: 118 10*3/uL — ABNORMAL LOW (ref 145–400)
RBC: 4.84 MIL/uL (ref 3.70–5.45)
RDW: 16.1 % — ABNORMAL HIGH (ref 11.2–14.5)
WBC: 8.6 10*3/uL (ref 3.9–10.3)

## 2017-06-14 LAB — CORTISOL: CORTISOL PLASMA: 1 ug/dL

## 2017-06-14 MED ORDER — SODIUM CHLORIDE 0.9 % IV SOLN
10.0000 mg/kg | Freq: Once | INTRAVENOUS | Status: AC
  Administered 2017-06-14: 700 mg via INTRAVENOUS
  Filled 2017-06-14: qty 12

## 2017-06-14 NOTE — Patient Instructions (Signed)
[facility] Discharge Instructions for Patients Receiving Chemotherapy  Today you received the following chemotherapy agents Avastin  To help prevent nausea and vomiting after your treatment, we encourage you to take your nausea medication as directed   If you develop nausea and vomiting that is not controlled by your nausea medication, call the clinic.   BELOW ARE SYMPTOMS THAT SHOULD BE REPORTED IMMEDIATELY:  *FEVER GREATER THAN 100.5 F  *CHILLS WITH OR WITHOUT FEVER  NAUSEA AND VOMITING THAT IS NOT CONTROLLED WITH YOUR NAUSEA MEDICATION  *UNUSUAL SHORTNESS OF BREATH  *UNUSUAL BRUISING OR BLEEDING  TENDERNESS IN MOUTH AND THROAT WITH OR WITHOUT PRESENCE OF ULCERS  *URINARY PROBLEMS  *BOWEL PROBLEMS  UNUSUAL RASH Items with * indicate a potential emergency and should be followed up as soon as possible.  Feel free to call the clinic should you have any questions or concerns. The clinic phone number is [phone].  Please show the CHEMO ALERT CARD at check-in to the Emergency Department and triage nurse.   

## 2017-06-14 NOTE — Progress Notes (Signed)
[facility] at [address] [place], [place] [address] [phone]   Interval Evaluation  Date of Service: 06/14/17 Patient Name: Kelly Maxwell Patient MRN: [id number] Patient DOB: 1945-07-03 Provider: Ventura Sellers, MD  Identifying Statement:  Kelly Maxwell is a 72 y.o. 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  Path demonstrates Gliosarcoma       - 02/18/2012 Radiation Therapy    Completes RT and concurrent Temodar with Dr. Tammi Klippel       - 09/14/2013 Chemotherapy    Completes 18 cycles of adjuvant Temodar, treated at [place].       09/29/2015 Progression    Progression #1.  Starts metronomic temodar 50mg /m2 daily       - 09/26/2016 Chemotherapy    Completes 1 year metronomic temodar       04/19/2017 Progression    Progression #2.  Recommendation for Avastin 10mg /kg q2 weeks       Interval History:  Kelly Maxwell presents today for fourth Avastin infusion. She denies new or progressive neurologic deficits, although does describe overall more fatigue than prior weeks.  Recently had a UTI which was treated by urologist. Still no seizures since starting Vimpat, still on 2mg  BID decadron.  No convulsive seizures or headaches.  Medications: Current Outpatient Medications on File Prior to Visit  Medication Sig Dispense Refill  . amphetamine-dextroamphetamine (ADDERALL) 30 MG tablet Take 30 mg by mouth daily.     . Bromfenac Sodium (PROLENSA) 0.07 % SOLN Place 1 drop into the right eye 3 (three) times daily. Reported on 09/02/2015    . clonazePAM (KLONOPIN) 0.5 MG tablet Take 0.5 mg by mouth at bedtime as needed for anxiety.    Marland Kitchen dexamethasone (DECADRON) 2 MG tablet Take 1 tablet (2 mg total) by mouth 2 (two) times daily. 60 tablet 2  .  Difluprednate (DUREZOL) 0.05 % EMUL Apply 1 drop to eye 4 (four) times daily.    Marland Kitchen lacosamide (VIMPAT) 50 MG TABS tablet Take 1 tablet (50 mg total) by mouth 2 (two) times daily. 60 tablet 0  . LORazepam (ATIVAN) 0.5 MG tablet TAKE 1 TABLET 30 TO 60 MINUTES PRIOR TO MRI. MAY REPEAT ONCE FOR 1 ADDITIONAL RELIEF  0  . Melatonin 1 MG CAPS Take 2 capsules by mouth at bedtime as needed (sleep).     . ondansetron (ZOFRAN) 8 MG tablet Take 1 tab (8mg ) by mouth 1 hour before daily temozolomide. Take every 8 hours as needed for nausea.    . ondansetron (ZOFRAN) 8 MG tablet Take 1 tablet by mouth every 8 (eight) hours as needed.    Marland Kitchen oxybutynin (DITROPAN-XL) 10 MG 24 hr tablet     . tretinoin (RETIN-A) 0.05 % cream Apply 1 application topically at bedtime as needed. spots  0  . venlafaxine XR (EFFEXOR-XR) 37.5 MG 24 hr capsule Take 1 capsule by mouth daily.  3   No current facility-administered medications on file prior to visit.     Allergies:  Allergies  Allergen Reactions  . Codeine Nausea And Vomiting and Nausea Only  . 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    . Compazine [Prochlorperazine Edisylate]  Other (See Comments) and Rash    Became hyper Became hyper   Past Medical History:  Past Medical History:  Diagnosis Date  . Cancer ([place])   . Depression   . GERD (gastroesophageal reflux disease)   . Glial neoplasm of brain ([place]) 12/17/11   left temporal, grade IV, 2.7 x 3.6 x 2.5 cm ring enhancing; numerous small surrounding satellite lesions 12/13/11  . Headache(784.0)   . Hiatal hernia   . History of radiation therapy 01/07/2012-02/18/12   left temporal  60GY  . HLD (hyperlipidemia)   . PONV (postoperative nausea and vomiting)   . 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 BUNIONECTOMY  2001   right  . CATARACT EXTRACTION Left   . CRANIOTOMY  12/17/2011   Procedure: CRANIOTOMY TUMOR EXCISION;  Surgeon: Otilio Connors, MD;  Location: [facility];  Service: Neurosurgery;  Laterality: Left;  LEFT temporal craniotomy with stealth for tumor resection  . DILATION AND CURETTAGE OF UTERUS  2007  . TOTAL KNEE ARTHROPLASTY  2000   left  . VENTRAL HERNIA REPAIR N/A 09/03/2015   Procedure: LAPAROSCOPIC repair of incarcerated spigelian hernia with mesh, reduction and repair ;  Surgeon: [name], MD;  Location: [facility];  Service: General;  Laterality: N/A;   Social History:  Social History   Socioeconomic History  . Marital status: Married    Spouse name: Not on file  . Number of children: 3  . Years of education: Not on file  . Highest education level: Not on file  Occupational History  . Not on file  Social Needs  . Financial resource strain: Not on file  . Food insecurity:    Worry: Not on file    Inability: Not on file  . Transportation needs:    Medical: Not on file    Non-medical: Not on file  Tobacco Use  . Smoking status: Never Smoker  . Smokeless tobacco: Never Used  Substance and Sexual Activity  . Alcohol use: No  . Drug use: No  . Sexual activity: Yes  Lifestyle  . Physical activity:    Days per week: Not on file    Minutes per session: Not on file  . Stress: Not on file  Relationships  . Social connections:    Talks on phone: Not on file    Gets together: Not on file    Attends religious service: Not on file    Active member of club or organization: Not on file    Attends meetings of clubs or organizations: Not on file    Relationship status: Not on file  . Intimate partner violence:    Fear of current or ex partner: Not on file    Emotionally abused: Not on file    Physically abused: Not on file    Forced sexual activity: Not on file  Other Topics Concern  . Not on file  Social History Narrative   2nd marriage of 6  years, previously widowed   Family History:  Family History  Problem Relation Age of Onset  . Heart disease Mother     Review of Systems: Constitutional: Denies fevers, chills or abnormal weight loss Eyes: Denies blurriness of vision Ears, nose, mouth, throat, and face: dry mucosa, hoarseness Respiratory: Denies cough, dyspnea or wheezes Cardiovascular: Denies palpitation, chest discomfort or lower extremity swelling Gastrointestinal:  Denies nausea, constipation, diarrhea GU: Denies dysuria or incontinence Skin: Denies abnormal skin rashes Neurological: Per HPI Musculoskeletal: Denies joint pain, back or neck discomfort. No decrease in ROM Behavioral/Psych: Denies anxiety, disturbance in thought content, and mood instability  Physical Exam: Vitals:   06/14/17 1257  BP: (!) 139/95  Pulse: (!) 105  Resp: 17  Temp: 98 F (36.7 C)  SpO2: 95%   KPS: 80. General: Alert, cooperative, pleasant, in no acute distress Head: Normal EENT: No conjunctival injection or scleral icterus. Oral mucosa moist Lungs: Resp effort normal Cardiac: Regular rate and rhythm Abdomen: Soft, non-distended abdomen Skin: No rashes cyanosis or petechiae. Extremities: No clubbing or edema  Neurologic Exam: Mental Status: Awake, alert, attentive to examiner. Oriented to self and environment. Language is fluent with intact comprehension.  Age advanced psychomotor slowing with impaired short term memory. Cranial Nerves: Visual acuity is grossly normal. Visual fields are full. Extra-ocular movements intact. Left eye ptosis, chronic. Face is symmetric, tongue midline. Motor: Tone and bulk are normal. Power is full in both arms and legs. Reflexes are symmetric, no pathologic reflexes present. Intact finger to nose bilaterally Sensory: Intact to light touch and temperature Gait: Slow, deliberate.  Unable to tandem   Labs: I have reviewed the data as listed    Component Value Date/Time   NA 140 05/17/2017  1002   NA 140 09/11/2013 1238   K 3.2 (L) 05/17/2017 1002   K 4.7 09/11/2013 1238   CL 107 05/17/2017 1002   CL 111 (H) 08/04/2012 1008   CO2 22 05/17/2017 1002   CO2 23 09/11/2013 1238   GLUCOSE 74 05/17/2017 1002   GLUCOSE 87 09/11/2013 1238   GLUCOSE 86 08/04/2012 1008   BUN 17 05/17/2017 1002   BUN 13.3 10/29/2014 1127   CREATININE 0.64 05/17/2017 1002   CREATININE 0.8 10/29/2014 1127   CALCIUM 10.2 05/17/2017 1002   CALCIUM 9.5 09/11/2013 1238   PROT 7.1 05/17/2017 1002   PROT 6.6 09/11/2013 1238   ALBUMIN 3.9 05/17/2017 1002   ALBUMIN 3.9 09/11/2013 1238   AST 15 05/17/2017 1002   AST 19 09/11/2013 1238   ALT 21 05/17/2017 1002   ALT 27 09/11/2013 1238   ALKPHOS 104 05/17/2017 1002   ALKPHOS 106 09/11/2013 1238   BILITOT 0.8 05/17/2017 1002   BILITOT 0.72 09/11/2013 1238   GFRNONAA >60 05/17/2017 1002   GFRAA >60 05/17/2017 1002   Lab Results  Component Value Date   WBC 7.7 05/17/2017   NEUTROABS 6.5 05/17/2017   HGB 13.8 05/11/2016   HCT 45.3 05/17/2017   MCV 92.4 05/17/2017   PLT 156 05/17/2017     Assessment/Plan 1. 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 Once we obtain urine protein she will be clear to undergo 4th Avastin infusion.    Because of fatigue we will check panel of labs today including B12/folate, TSH, cortisol and Vitamin D in addition to routine studies.    Will continue decadron 2mg  BID and Vimpat 50mg  BID.  For hoarseness, we recommended adding humidifier at night.  Should return in 2 weeks with an MRI brain for evaluation.  Plan is for evaluation from Dr. Wynetta Emery at [facility] following scan for treatment recommendations.    We appreciate the opportunity to participate in the care of Kelly Maxwell.   All questions were answered. The patient knows to call the clinic with any problems, questions or concerns. No barriers  to learning were detected.  The total time spent in the  encounter was 25 minutes and more than 50% was on counseling and review of test results   Ventura Sellers, MD Medical Director of Neuro-Oncology [facility] at [place] 06/14/17 12:26 PM

## 2017-06-14 NOTE — Progress Notes (Signed)
Patient is unable to provide a clean urine sample.  Per Dr. Mickeal Skinner patient is ok to treat with with urine protein results from 05/31/2017.

## 2017-06-15 LAB — VITAMIN D 25 HYDROXY (VIT D DEFICIENCY, FRACTURES): VIT D 25 HYDROXY: 19.8 ng/mL — AB (ref 30.0–100.0)

## 2017-06-16 ENCOUNTER — Emergency Department
Admission: EM | Admit: 2017-06-16 | Discharge: 2017-06-17 | Disposition: A | Discharge status: Home / Self Care | Payer: Medicare (Managed Care) | Attending: Emergency Medicine | Admitting: Emergency Medicine

## 2017-06-16 ENCOUNTER — Emergency Department: Payer: Medicare (Managed Care)

## 2017-06-16 DIAGNOSIS — Z743 Need for continuous supervision: Secondary | ICD-10-CM | POA: Diagnosis not present

## 2017-06-16 DIAGNOSIS — N390 Urinary tract infection, site not specified: Secondary | ICD-10-CM | POA: Diagnosis not present

## 2017-06-16 DIAGNOSIS — N281 Cyst of kidney, acquired: Secondary | ICD-10-CM | POA: Diagnosis not present

## 2017-06-16 DIAGNOSIS — C719 Malignant neoplasm of brain, unspecified: Secondary | ICD-10-CM | POA: Diagnosis not present

## 2017-06-16 DIAGNOSIS — J9811 Atelectasis: Secondary | ICD-10-CM | POA: Diagnosis not present

## 2017-06-16 DIAGNOSIS — R4182 Altered mental status, unspecified: Secondary | ICD-10-CM | POA: Diagnosis not present

## 2017-06-16 HISTORY — DX: Neoplasm of unspecified behavior of brain: D49.6

## 2017-06-16 LAB — COMPREHENSIVE METABOLIC PANEL
ALT: 20 U/L (ref 0–55)
AST (SGOT): 13 U/L (ref 5–34)
Albumin/Globulin Ratio: 1.4 (ref 0.9–2.2)
Albumin: 2.7 g/dL — ABNORMAL LOW (ref 3.5–5.0)
Alkaline Phosphatase: 54 U/L (ref 37–106)
Anion Gap: 6 (ref 5.0–15.0)
BUN: 15 mg/dL (ref 7.0–19.0)
Bilirubin, Total: 0.6 mg/dL (ref 0.2–1.2)
CO2: 20 mEq/L — ABNORMAL LOW (ref 22–29)
Calcium: 7.9 mg/dL (ref 7.9–10.2)
Chloride: 113 mEq/L — ABNORMAL HIGH (ref 100–111)
Creatinine: 0.5 mg/dL — ABNORMAL LOW (ref 0.6–1.0)
Globulin: 1.9 g/dL — ABNORMAL LOW (ref 2.0–3.6)
Glucose: 74 mg/dL (ref 70–100)
Potassium: 3.4 mEq/L — ABNORMAL LOW (ref 3.5–5.1)
Protein, Total: 4.6 g/dL — ABNORMAL LOW (ref 6.0–8.3)
Sodium: 139 mEq/L (ref 136–145)

## 2017-06-16 LAB — CBC AND DIFFERENTIAL
Absolute NRBC: 0 10*3/uL (ref 0.00–0.00)
Basophils Absolute Automated: 0.01 10*3/uL (ref 0.00–0.08)
Basophils Automated: 0.2 %
Eosinophils Absolute Automated: 0.06 10*3/uL (ref 0.00–0.44)
Eosinophils Automated: 1.1 %
Hematocrit: 38.8 % (ref 34.7–43.7)
Hgb: 12.9 g/dL (ref 11.4–14.8)
Immature Granulocytes Absolute: 0.11 10*3/uL — ABNORMAL HIGH (ref 0.00–0.07)
Immature Granulocytes: 1.9 %
Lymphocytes Absolute Automated: 0.49 10*3/uL (ref 0.42–3.22)
Lymphocytes Automated: 8.6 %
MCH: 31.4 pg (ref 25.1–33.5)
MCHC: 33.2 g/dL (ref 31.5–35.8)
MCV: 94.4 fL (ref 78.0–96.0)
MPV: 9.8 fL (ref 8.9–12.5)
Monocytes Absolute Automated: 0.22 10*3/uL (ref 0.21–0.85)
Monocytes: 3.9 %
Neutrophils Absolute: 4.8 10*3/uL (ref 1.10–6.33)
Neutrophils: 84.3 %
Nucleated RBC: 0 /100 WBC (ref 0.0–0.0)
Platelets: 85 10*3/uL — ABNORMAL LOW (ref 142–346)
RBC: 4.11 10*6/uL (ref 3.90–5.10)
RDW: 15 % (ref 11–15)
WBC: 5.69 10*3/uL (ref 3.10–9.50)

## 2017-06-16 LAB — TROPONIN I: Troponin I: <0.01 ng/mL (ref 0.00–0.09)

## 2017-06-16 LAB — GFR: EGFR: >60

## 2017-06-16 LAB — HEMOLYSIS INDEX: Hemolysis Index: 14 (ref 0–18)

## 2017-06-16 NOTE — ED Notes (Signed)
Bed: GR10  Expected date:   Expected time:   Means of arrival:   Comments:  m,[id number]

## 2017-06-16 NOTE — ED Triage Notes (Addendum)
Patient is a 72 y/o female with history of primary brain tumor BIBA for lethargy/decreased mobility and AMS according to daughter and husband of patient. The family is visiting from [place] and the daughter states that the patient did not want to get out of bed today and had an episode of incontinence while she and her father were out during the day. Patient appears to be slightly altered according to family when compared to her baseline, daughter states that this type of situation has occurred once before and she was diagnosed with a UTI at that time. Patient is alert and oriented upon arrival and denies any problems or experiencing any pain at this time. Dexi was 95 en route per EMS, BP was 160/70, and NSR on the monitor. Per patient's husband she was treated for a UTI and completed the abx about one week ago. Is seeing a urologist for "problems with her bladder."

## 2017-06-16 NOTE — ED Provider Notes (Signed)
EMERGENCY DEPARTMENT HISTORY AND PHYSICAL EXAM     Physician/Midlevel provider first contact with patient: 06/16/17 2201         Date: 06/16/2017  Patient Name: Brandi Hodge    History of Presenting Illness     Chief Complaint   Patient presents with   . Altered Mental Status   . Fatigue       History Provided By: Patient, Patient's Husband and Patient's Daughter    Chief Complaint: AMS   Duration: 2 days  Timing:  Progressive  Location: NA  Quality: NA  Severity: Moderate  Exacerbating factors: none  Alleviating factors: none  Associated Symptoms: fatigue, urinary incontinence  Pertinent Negatives: fever, diarrhea, vomiting, dysuria, CP, SOB, HA    Additional History: Brandi Hodge is a 72 y.o. female h/o brain cancer, craniotomy, cataract extraction, presenting to the ED with progressive AMS with associated fatigue, and urinary incontinence. Pt has been sleeping more often lately and has collapsed into the daughter's when trying to ambulate yesterday. Pt is able to recall name and place but not the year. Reports recently switching medications to Avastin and having felt worse since then. Pt had episode of urinary incontinence today, she is being seen by a urologist for the issue. Denies fever, diarrhea, vomiting, dysuria, CP, SOB, or HA.      PCP: Pcp, [place], MD  SPECIALISTS:    Current Facility-Administered Medications   Medication Dose Route Frequency Provider Last Rate Last Dose   . cefTRIAXone (ROCEPHIN) 1 g in sodium chloride 0.9 % 100 mL IVPB mini-bag plus  1 g Intravenous Once Suzy Bouchard, MD 200 mL/hr at 06/17/17 0142 1 g at 06/17/17 0142     Current Outpatient Prescriptions   Medication Sig Dispense Refill   . amphetamine-dextroamphetamine (ADDERALL) 10 MG tablet Take 30 mg by mouth daily     . bevacizumab (AVASTIN) 100 MG/4ML Solution 50 mg by Intra-Lesional route once     . Bromfenac Sodium (PROLENSA) 0.07 % Solution Apply to eye     . dexamethasone (DECADRON) 4 MG tablet Take 4 mg by mouth  every 12 (twelve) hours     . difluprednate (DUREZOL) 0.05 % Emulsion ophthalmic solution      . lacosamide (VIMPAT) 50 MG Tab Take 100 mg by mouth 2 (two) times daily     . venlafaxine (EFFEXOR) 75 MG tablet Take 75 mg by mouth 2 (two) times daily     . ciprofloxacin (CIPRO) 500 MG tablet Take 1 tablet (500 mg total) by mouth 2 (two) times daily for 5 days 10 tablet 0       Past History     Past Medical History:  Past Medical History:   Diagnosis Date   . Brain tumor        Past Surgical History:  Past Surgical History:   Procedure Laterality Date   . CATARACT EXTRACTION Left    . CRANIOTOMY  2013   . HERNIA REPAIR  2018   . JOINT REPLACEMENT     . REPLACEMENT TOTAL KNEE Left        Family History:  History reviewed. No pertinent family history.    Social History:  Social History   Substance Use Topics   . Smoking status: Never Smoker   . Smokeless tobacco: Never Used   . Alcohol use No       Allergies:  Allergies   Allergen Reactions   . Compazine [Prochlorperazine]        Review  of Systems     Review of Systems   Constitutional: Positive for fatigue. Negative for fever.   HENT: Negative for congestion.    Eyes: Negative for pain.   Respiratory: Negative for shortness of breath.    Cardiovascular: Negative for chest pain.   Gastrointestinal: Negative for abdominal pain, diarrhea, nausea and vomiting.   Genitourinary: Negative for dysuria.        Positive for urinary incontinence   Musculoskeletal: Negative for back pain.   Skin: Negative for rash.   Neurological: Negative for syncope and headaches.   Psychiatric/Behavioral: Negative for agitation.         Physical Exam   BP 152/70   Pulse (!) 51   Temp 98 F (36.7 C) (Oral)   Resp 15   Ht 5\' 5"  (1.651 m)   Wt 66.7 kg   SpO2 97%   BMI 24.46 kg/m     Physical Exam   Constitutional: She appears well-developed and well-nourished.   HENT:   Head: Normocephalic.   Eyes: Conjunctivae are normal.   Srugical left pupil   Neck: Neck supple.   Cardiovascular: Normal  rate and regular rhythm.    Pulmonary/Chest: Effort normal and breath sounds normal. No respiratory distress. She has no wheezes.   Abdominal: Soft. There is tenderness in the left lower quadrant.   Musculoskeletal: She exhibits no deformity.   Neurological:   Alert and oriented x2. CN II-XII intact, finger to nose normal, no pronator drift     Skin: Skin is warm.   Nursing note and vitals reviewed.        Diagnostic Study Results     Labs -     Results     Procedure Component Value Units Date/Time    UA Reflex to Micro - Reflex to Culture [[id number]]  (Abnormal) Collected:  06/16/17 2302     Updated:  06/17/17 0030     Urine Type Urine, Clean Ca     Color, UA Yellow     Clarity, UA Sl Cloudy (A)     Specific Gravity UA 1.015     Urine pH 5.0     Leukocyte Esterase, UA Trace (A)     Nitrite, UA Negative     Protein, UR Negative     Glucose, UA Negative     Ketones UA 20 (A)     Urobilinogen, UA 2.0 mg/dL      Bilirubin, UA Negative     Blood, UA Negative     RBC, UA 11 - 25 (A) /hpf      WBC, UA 11 - 25 (A) /hpf      Squamous Epithelial Cells, Urine 0 - 5 /hpf      Calcium Oxalate Crystals, UA Occasional /hpf      Hyaline Casts, UA 6 - 10 (A) /lpf      Yeast, UA Occasional (A)     Urine Mucus Present    Narrative:       Replace urinary catheter prior to obtaining the urine culture  if it has been in place for greater than or equal to 14  days:->N/A No Foley  Indications for U/A Reflex to Micro - Reflex to  Culture:->Suprapubic Pain/Tenderness or Dysuria    CBC with differential [[id number]]  (Abnormal) Collected:  06/16/17 2302    Specimen:  Blood from Blood Updated:  06/16/17 2350     WBC 5.69 x10 3/uL      Hgb 12.9 g/dL  Hematocrit 38.8 %      Platelets 85 (L) x10 3/uL      RBC 4.11 x10 6/uL      MCV 94.4 fL      MCH 31.4 pg      MCHC 33.2 g/dL      RDW 15 %      MPV 9.8 fL      Neutrophils 84.3 %      Lymphocytes Automated 8.6 %      Monocytes 3.9 %      Eosinophils Automated 1.1 %      Basophils Automated 0.2  %      Immature Granulocyte 1.9 %      Nucleated RBC 0.0 /100 WBC      Neutrophils Absolute 4.80 x10 3/uL      Abs Lymph Automated 0.49 x10 3/uL      Abs Mono Automated 0.22 x10 3/uL      Abs Eos Automated 0.06 x10 3/uL      Absolute Baso Automated 0.01 x10 3/uL      Absolute Immature Granulocyte 0.11 (H) x10 3/uL      Absolute NRBC 0.00 x10 3/uL     Narrative:       Replace urinary catheter prior to obtaining the urine culture  if it has been in place for greater than or equal to 14  days:->N/A No Foley  Indications for U/A Reflex to Micro - Reflex to  Culture:->Suprapubic Pain/Tenderness or Dysuria    Comprehensive metabolic panel [[id number]]  (Abnormal) Collected:  06/16/17 2302    Specimen:  Blood Updated:  06/16/17 2343     Glucose 74 mg/dL      BUN 34.7 mg/dL      Creatinine 0.5 (L) mg/dL      Sodium 425 mEq/L      Potassium 3.4 (L) mEq/L      Chloride 113 (H) mEq/L      CO2 20 (L) mEq/L      Calcium 7.9 mg/dL      Protein, Total 4.6 (L) g/dL      Albumin 2.7 (L) g/dL      AST (SGOT) 13 U/L      ALT 20 U/L      Alkaline Phosphatase 54 U/L      Bilirubin, Total 0.6 mg/dL      Globulin 1.9 (L) g/dL      Albumin/Globulin Ratio 1.4     Anion Gap 6.0    Narrative:       Replace urinary catheter prior to obtaining the urine culture  if it has been in place for greater than or equal to 14  days:->N/A No Foley  Indications for U/A Reflex to Micro - Reflex to  Culture:->Suprapubic Pain/Tenderness or Dysuria    Hemolysis index [[id number]] Collected:  06/16/17 2302     Updated:  06/16/17 2343     Hemolysis Index 14    Narrative:       Replace urinary catheter prior to obtaining the urine culture  if it has been in place for greater than or equal to 14  days:->N/A No Foley  Indications for U/A Reflex to Micro - Reflex to  Culture:->Suprapubic Pain/Tenderness or Dysuria    GFR [[id number]] Collected:  06/16/17 2302     Updated:  06/16/17 2343     EGFR >60.0    Narrative:       Replace urinary catheter prior to obtaining the  urine culture  if it has been in  place for greater than or equal to 14  days:->N/A No Foley  Indications for U/A Reflex to Micro - Reflex to  Culture:->Suprapubic Pain/Tenderness or Dysuria    Troponin I [[id number]] Collected:  06/16/17 2302    Specimen:  Blood Updated:  06/16/17 2342     Troponin I <0.01 ng/mL     Narrative:       Replace urinary catheter prior to obtaining the urine culture  if it has been in place for greater than or equal to 14  days:->N/A No Foley  Indications for U/A Reflex to Micro - Reflex to  Culture:->Suprapubic Pain/Tenderness or Dysuria          Radiologic Studies -   Radiology Results (24 Hour)     Procedure Component Value Units Date/Time    CT Abd/Pelvis with IV Contrast only [[id number]] Collected:  06/17/17 0108    Order Status:  Completed Updated:  06/17/17 0115    Narrative:       History: Left lower quadrant pain. Brain cancer.    Comparison: None.    Technique: Axial CT abdomen pelvis without contrast.    A combination of automatic exposure control, adjustment of the mA and/or  kV according to patient size and/or use of iterative reconstruction  technique was utilized.    Findings:       Lung bases: Clear. Eventration right hemidiaphragm.    Liver: Unremarkable.    Gallbladder: Unremarkable. No radiodense stones.    Pancreas: Unremarkable.    Spleen: Multiple hypodensities, probable cysts.    Adrenal glands: Unremarkable.    Kidneys: Cysts. No stones, fat stranding, or hydronephrosis.    Stomach: Small hiatal hernia.    Bowel: Mild/moderate colonic stool burden.     Appendix: Normal.    Lymph nodes: Unremarkable.     Aorta: Normal course and caliber.    Bladder: Unremarkable.    Genitals: Unremarkable.    Bones: No suspicious osseous lesions.    Other: Standard gonadal veins with prominent periuterine uterine  varices.      Impression:         Pelvic venous congestion.    Mild/moderate colonic stool burden.    No evidence of metastatic  disease.    Recommendation:  None.    Adaline Sill, MD   06/17/2017 1:11 AM    CT Head WO Contrast [[id number]] Collected:  06/17/17 0050    Order Status:  Completed Updated:  06/17/17 0107    Narrative:       History: Brain cancer.        Comparisons: None.    Technique: Axial CT brain obtained from vertex to skull base without  contrast.     A combination of automatic exposure control, adjustment of the mA and/or  kV according to patient size and/or use of iterative reconstruction  technique was utilized.    Findings:  Gray-white matter differentiation preserved. Cerebral atrophy.  Asymmetric periventricular and subcortical white matter lucencies left  greater than right hemisphere.         Basal ganglia, thalami, midbrain, pons unremarkable.    No mass, mass effect, or midline shift. No intra-axial or extra-axial  hemorrhage or collection.    Ventricles, sulci, cisterns age-appropriate in size and configuration.    Postoperative changes left temporal craniotomy.    Soft tissue unremarkable.      Impression:          No acute intracranial abnormality.    Recommendation:  Comparison with prior cross-sectional imaging  if available.    Adaline Sill, MD   06/17/2017 1:02 AM    XR Chest  AP Portable [[id number]] Collected:  06/17/17 0003    Order Status:  Completed Updated:  06/17/17 0008    Narrative:       Chest x-ray    CLINICAL INDICATION:   inf workup    COMPARISON: None    TECHNIQUE: Portable    FINDINGS: Cardiomediastinal silhouette is [unremarkable]. There is no  focal infiltrate. As atelectatic change left base. No pleural effusion  or pneumothorax. There is elevation of the right hemidiaphragm. Osseous  structures are unremarkable.      Impression:        No acute cardiopulmonary process. Mild atelectatic change  left base.    [place]  Teferra, MD   06/17/2017 12:04 AM      .    Medical Decision Making   I am the first provider for this patient.    I reviewed the vital signs, available nursing notes, past  medical history, past surgical history, family history and social history.    Vital Signs-Reviewed the patient's vital signs.     Patient Vitals for the past 12 hrs:   BP Temp Pulse Resp   06/17/17 0100 152/70 - (!) 51 15   06/16/17 2355 172/77 - (!) 57 -   06/16/17 2126 164/79 98 F (36.7 C) 61 18       Pulse Oximetry Analysis - Normal 97% on RA.    EKG:  Interpreted by the EP.   Time Interpreted: 2134   Rate: 59   Rhythm: Sinus Bradycardia   Interpretation: normal axis, normal intervals, T-wave inversions of lead III, V2, V4, V5. Indeterminate EKG.   Comparison: No prior study is available for comparison.      Old Medical Records: Nursing notes.     ED Course:     1:20 AM - Pt and family updated on all results, pt is feeling better, and would like to be d/ced.    2:00AM - Patient feels better. I have discussed all testing results and plan of care with patient. All questions solicited and addressed. Possibility of evolving illness reviewed. Patient agrees with going home, following up and return if worsening symptoms.        Provider Notes: 24F with PMH of brain cancer presents with increased fatigue and ams, has been sleeping all day, denies any fever/n/v/cp/sob, urinated on self, recently tx for UTI, AOx2, neuro exam non focal otherwise, LLQ abd tenderness. Ddx: UTI, diverticulitis, disease progression, EKG with inferolateral TWI (no prior however given no cp/sob, don't suspect ACS, will obtain troponin for low ACS suspicion) plan for labwork, inf workup and dispo pending workup.     Diagnosis     Clinical Impression:   1. Acute UTI        Treatment Plan:   ED Disposition     ED Disposition Condition Date/Time Comment    Discharge  Mon Jun 17, 2017  2:00 AM Brandi Hodge discharge to home/self care.    Condition at disposition: Stable            _______________________________      Attestations: This note is prepared by Can Ozyildirim, acting as scribe for Suzy Bouchard, MD.    Suzy Bouchard, MD - The scribe's  documentation has been prepared under my direction and personally reviewed by me in its entirety.  I confirm that the note above accurately reflects all work, treatment, procedures,  and medical decision making performed by me.    _______________________________     Suzy Bouchard, MD  06/17/17 [phone]

## 2017-06-17 ENCOUNTER — Emergency Department: Payer: Medicare (Managed Care)

## 2017-06-17 ENCOUNTER — Telehealth: Payer: Self-pay

## 2017-06-17 DIAGNOSIS — N281 Cyst of kidney, acquired: Secondary | ICD-10-CM | POA: Diagnosis not present

## 2017-06-17 DIAGNOSIS — C719 Malignant neoplasm of brain, unspecified: Secondary | ICD-10-CM | POA: Diagnosis not present

## 2017-06-17 DIAGNOSIS — N390 Urinary tract infection, site not specified: Secondary | ICD-10-CM | POA: Diagnosis not present

## 2017-06-17 LAB — ECG 12-LEAD
Atrial Rate: 59 {beats}/min
P Axis: 55 degrees
P-R Interval: 186 ms
Q-T Interval: 428 ms
QRS Duration: 80 ms
QTC Calculation (Bezet): 423 ms
R Axis: 0 degrees
T Axis: -37 degrees
Ventricular Rate: 59 {beats}/min

## 2017-06-17 LAB — URINALYSIS REFLEX TO MICROSCOPIC EXAM - REFLEX TO CULTURE
Bilirubin, UA: NEGATIVE
Blood, UA: NEGATIVE
Glucose, UA: NEGATIVE
Ketones UA: 20 — AB
Nitrite, UA: NEGATIVE
Protein, UR: NEGATIVE
Specific Gravity UA: 1.015 (ref 1.001–1.035)
Urine pH: 5 (ref 5.0–8.0)
Urobilinogen, UA: 2 mg/dL

## 2017-06-17 MED ORDER — IOHEXOL 350 MG/ML IV SOLN
100.00 mL | Freq: Once | INTRAVENOUS | Status: AC | PRN
Start: 2017-06-17 — End: 2017-06-17
  Administered 2017-06-17: 01:00:00 100 mL via INTRAVENOUS

## 2017-06-17 MED ORDER — SODIUM CHLORIDE 0.9 % IV MBP
1.00 g | Freq: Once | INTRAVENOUS | Status: AC
Start: 2017-06-17 — End: 2017-06-17
  Administered 2017-06-17: 02:00:00 1 g via INTRAVENOUS
  Filled 2017-06-17: qty 100
  Filled 2017-06-17: qty 1000

## 2017-06-17 MED ORDER — CIPROFLOXACIN HCL 500 MG PO TABS
500.00 mg | ORAL_TABLET | Freq: Two times a day (BID) | ORAL | 0 refills | Status: AC
Start: 2017-06-17 — End: 2017-06-22

## 2017-06-17 MED ORDER — POTASSIUM CHLORIDE 10 MEQ/100ML IV SOLN
10.00 meq | Freq: Once | INTRAVENOUS | Status: AC
Start: 2017-06-17 — End: 2017-06-17
  Administered 2017-06-17: 01:00:00 10 meq via INTRAVENOUS
  Filled 2017-06-17: qty 100

## 2017-06-17 MED ORDER — POTASSIUM CHLORIDE CRYS ER 20 MEQ PO TBCR
20.00 meq | EXTENDED_RELEASE_TABLET | Freq: Once | ORAL | Status: AC
Start: 2017-06-17 — End: 2017-06-17
  Administered 2017-06-17: 02:00:00 20 meq via ORAL
  Filled 2017-06-17: qty 1

## 2017-06-17 NOTE — Telephone Encounter (Signed)
Per 4/12 no los °

## 2017-06-17 NOTE — Discharge Instructions (Signed)
Urinary Tract Infection, Lower    You have been diagnosed with an uncomplicated lower urinary tract infection (UTI).     A UTI is an infection in your bladder. Your doctor diagnosed it by testing your urine. UTIs usually causes burning with urination (peeing) or frequent urination. It might make you feel like you have to urinate even when you don't.     UTI is usually treated with antibiotics and medicine to help with pain.    It is VERY IMPORTANT that you fill your prescription and take all of the antibiotics as directed. If a lower urinary tract infection goes untreated for too long, it Juliocesar Blasius become a kidney infection.    FOR WOMEN: To reduce the risk of getting UTIs again:   Always urinate before and after sexual intercourse.   Always wipe from front to back after urinating or having a bowel movement. Do not wipe from back to front.   Drink plenty of fluids. Try to drink cranberry or blueberry juice. These juices have a chemical that stops bacteria from sticking to the bladder.    YOU SHOULD SEEK MEDICAL ATTENTION IMMEDIATELY, EITHER HERE OR AT THE NEAREST EMERGENCY DEPARTMENT, IF ANY OF THE FOLLOWING OCCURS:   You have a fever (temperature higher than 100.4F / 38C) or shaking chills.   You feel nauseated or vomit.   You have pain in your side or back.   You don't get better after taking all of your antibiotics.   You have any new symptoms or concerns.   You feel worse or do not improve.    If you Dagon Budai't follow up with your doctor, or if at any time you feel you need to be rechecked or seen again, come back here or go to the nearest emergency department.

## 2017-06-19 ENCOUNTER — Ambulatory Visit: Payer: Medicare HMO | Admitting: Physical Therapy

## 2017-06-19 ENCOUNTER — Other Ambulatory Visit: Payer: Self-pay | Admitting: *Deleted

## 2017-06-19 ENCOUNTER — Encounter (INDEPENDENT_AMBULATORY_CARE_PROVIDER_SITE_OTHER): Payer: Medicare HMO | Admitting: Ophthalmology

## 2017-06-19 ENCOUNTER — Ambulatory Visit (HOSPITAL_COMMUNITY)
Admission: RE | Admit: 2017-06-19 | Discharge: 2017-06-19 | Disposition: A | Discharge status: Home / Self Care | Payer: Medicare HMO | Source: Ambulatory Visit | Attending: Internal Medicine | Admitting: Internal Medicine

## 2017-06-19 ENCOUNTER — Other Ambulatory Visit: Payer: Self-pay | Admitting: Internal Medicine

## 2017-06-19 ENCOUNTER — Telehealth: Payer: Self-pay | Admitting: *Deleted

## 2017-06-19 DIAGNOSIS — K219 Gastro-esophageal reflux disease without esophagitis: Secondary | ICD-10-CM | POA: Diagnosis not present

## 2017-06-19 DIAGNOSIS — C719 Malignant neoplasm of brain, unspecified: Secondary | ICD-10-CM

## 2017-06-19 DIAGNOSIS — E785 Hyperlipidemia, unspecified: Secondary | ICD-10-CM | POA: Diagnosis not present

## 2017-06-19 DIAGNOSIS — R4701 Aphasia: Secondary | ICD-10-CM | POA: Diagnosis not present

## 2017-06-19 DIAGNOSIS — R69 Illness, unspecified: Secondary | ICD-10-CM | POA: Diagnosis not present

## 2017-06-19 DIAGNOSIS — C712 Malignant neoplasm of temporal lobe: Secondary | ICD-10-CM | POA: Diagnosis not present

## 2017-06-19 DIAGNOSIS — Z96652 Presence of left artificial knee joint: Secondary | ICD-10-CM | POA: Diagnosis not present

## 2017-06-19 DIAGNOSIS — R2689 Other abnormalities of gait and mobility: Secondary | ICD-10-CM | POA: Diagnosis not present

## 2017-06-19 DIAGNOSIS — R4702 Dysphasia: Secondary | ICD-10-CM | POA: Diagnosis not present

## 2017-06-19 DIAGNOSIS — R413 Other amnesia: Secondary | ICD-10-CM | POA: Diagnosis not present

## 2017-06-19 MED ORDER — GADOBENATE DIMEGLUMINE 529 MG/ML IV SOLN
15.0000 mL | Freq: Once | INTRAVENOUS | Status: AC | PRN
  Administered 2017-06-19: 14 mL via INTRAVENOUS

## 2017-06-19 NOTE — Telephone Encounter (Signed)
Patient's daughter called stating patient was out of town this weekend and experienced some mild expressive and receptive aphasia, she was found on the toilet and unable to move without assistance. She was taken to the ED, a CT scan was done. The physician told patient she had a mild UTI, and possibly some progression of the brain tumor. The ED physician encourage the family to f/u with Mickeal Skinner, MD.   Per Mickeal Skinner, MD scheduled a MRI of the brain with and without contrast on 06/19/17 at 1700 pm at [facility] arriving at 1630 pm, as well as be seen in office 06/20/17 at 1400 pm. Patient's daughter verbalized understanding.

## 2017-06-20 ENCOUNTER — Inpatient Hospital Stay (HOSPITAL_BASED_OUTPATIENT_CLINIC_OR_DEPARTMENT_OTHER): Payer: Medicare HMO | Admitting: Internal Medicine

## 2017-06-20 ENCOUNTER — Encounter: Payer: Self-pay | Admitting: Internal Medicine

## 2017-06-20 ENCOUNTER — Other Ambulatory Visit: Payer: Self-pay | Admitting: Internal Medicine

## 2017-06-20 ENCOUNTER — Telehealth: Payer: Self-pay | Admitting: Internal Medicine

## 2017-06-20 ENCOUNTER — Ambulatory Visit: Payer: Medicare HMO | Admitting: Physical Therapy

## 2017-06-20 ENCOUNTER — Telehealth: Payer: Self-pay | Admitting: *Deleted

## 2017-06-20 VITALS — BP 112/71 | HR 94 | Temp 98.4°F | Resp 18 | Ht 66.0 in | Wt 147.7 lb

## 2017-06-20 DIAGNOSIS — C719 Malignant neoplasm of brain, unspecified: Secondary | ICD-10-CM

## 2017-06-20 DIAGNOSIS — Z79899 Other long term (current) drug therapy: Secondary | ICD-10-CM | POA: Diagnosis not present

## 2017-06-20 DIAGNOSIS — Z923 Personal history of irradiation: Secondary | ICD-10-CM | POA: Diagnosis not present

## 2017-06-20 DIAGNOSIS — Z5112 Encounter for antineoplastic immunotherapy: Secondary | ICD-10-CM | POA: Diagnosis not present

## 2017-06-20 DIAGNOSIS — Z9221 Personal history of antineoplastic chemotherapy: Secondary | ICD-10-CM

## 2017-06-20 DIAGNOSIS — R490 Dysphonia: Secondary | ICD-10-CM | POA: Diagnosis not present

## 2017-06-20 DIAGNOSIS — Z87440 Personal history of urinary (tract) infections: Secondary | ICD-10-CM

## 2017-06-20 DIAGNOSIS — R531 Weakness: Secondary | ICD-10-CM | POA: Diagnosis not present

## 2017-06-20 DIAGNOSIS — R5383 Other fatigue: Secondary | ICD-10-CM | POA: Diagnosis not present

## 2017-06-20 DIAGNOSIS — K219 Gastro-esophageal reflux disease without esophagitis: Secondary | ICD-10-CM | POA: Diagnosis not present

## 2017-06-20 DIAGNOSIS — C712 Malignant neoplasm of temporal lobe: Secondary | ICD-10-CM | POA: Diagnosis not present

## 2017-06-20 DIAGNOSIS — E785 Hyperlipidemia, unspecified: Secondary | ICD-10-CM | POA: Diagnosis not present

## 2017-06-20 MED ORDER — VITAMIN D 1000 UNITS PO TABS: 1000.0000 [IU] | ORAL_TABLET | Freq: Every day | ORAL | 3 refills | Status: DC

## 2017-06-20 NOTE — Progress Notes (Signed)
[facility] [place], [place] [address] [phone]   Interval Evaluation  Date of Service: 06/20/17 Patient Name: Kelly Maxwell Patient MRN: [id number] Patient DOB: Oct 16, 1945 Provider: Ventura Sellers, MD  Identifying Statement:  Kelly Maxwell is a 72 y.o. 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  Path demonstrates Gliosarcoma       - 02/18/2012 Radiation Therapy    Completes RT and concurrent Temodar with Dr. Tammi Klippel       - 09/14/2013 Chemotherapy    Completes 18 cycles of adjuvant Temodar, treated at [place].       09/29/2015 Progression    Progression #1.  Starts metronomic temodar 50mg /m2 daily       - 09/26/2016 Chemotherapy    Completes 1 year metronomic temodar       04/19/2017 Progression    Progression #2.  Recommendation for Avastin 10mg /kg q2 weeks       Interval History:  Kelly Maxwell presents today after recent clinical decline this weekend prompted emergency care during an out of town trip.  Her daughter describes generalized weakness, inability to get off of toilet, which was new.  Her mentation and language also was slower and more inhibited than normal.  These changes prompted ED consultation in [place], where she was rehydrated intravenously and treated for UTI.  CT head was also performed (unavailable here) which per the family prompted concerns for progressive tumor.  This led [place] to move MRI that had been scheduled for next week up to this week, and arrange visit today.  Since the antibiotics she has improved back to her baseline.  Still with significant fatigue.   No convulsive seizures or headaches.  Medications: Current Outpatient Medications on File Prior to Visit  Medication Sig Dispense  Refill  . amphetamine-dextroamphetamine (ADDERALL) 30 MG tablet Take 30 mg by mouth daily.     . Bromfenac Sodium (PROLENSA) 0.07 % SOLN Place 1 drop into the right eye 3 (three) times daily. Reported on 09/02/2015    . ciprofloxacin (CIPRO) 500 MG tablet Take 500 mg by mouth 2 (two) times daily.    . clonazePAM (KLONOPIN) 0.5 MG tablet Take 0.5 mg by mouth at bedtime as needed for anxiety.    Marland Kitchen dexamethasone (DECADRON) 2 MG tablet Take 1 tablet (2 mg total) by mouth 2 (two) times daily. 60 tablet 2  . Difluprednate (DUREZOL) 0.05 % EMUL Apply 1 drop to eye 4 (four) times daily.    Marland Kitchen lacosamide (VIMPAT) 50 MG TABS tablet Take 1 tablet (50 mg total) by mouth 2 (two) times daily. 60 tablet 0  . Melatonin 1 MG CAPS Take 2 capsules by mouth at bedtime as needed (sleep).     . tolterodine (DETROL) 2 MG tablet Take 4 mg by mouth daily.    Marland Kitchen tretinoin (RETIN-A) 0.05 % cream Apply 1 application topically at bedtime as needed. spots  0  . venlafaxine XR (EFFEXOR-XR) 37.5 MG 24 hr capsule Take 2 capsules by mouth daily.   3  . LORazepam (ATIVAN) 0.5 MG tablet TAKE 1 TABLET 30 TO 60 MINUTES PRIOR TO MRI. MAY REPEAT ONCE FOR 1 ADDITIONAL RELIEF  0  . ondansetron (ZOFRAN)  8 MG tablet Take 1 tab (8mg ) by mouth 1 hour before daily temozolomide. Take every 8 hours as needed for nausea.    Marland Kitchen oxybutynin (DITROPAN-XL) 10 MG 24 hr tablet      No current facility-administered medications on file prior to visit.     Allergies:  Allergies  Allergen Reactions  . Codeine Nausea And Vomiting and Nausea Only  . 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    . Compazine [Prochlorperazine Edisylate] Other (See Comments) and Rash    Became hyper Became hyper   Past Medical History:  Past Medical History:  Diagnosis Date  . Cancer ([place])   . Depression   . GERD (gastroesophageal reflux disease)   . Glial neoplasm of brain ([place]) 12/17/11   left  temporal, grade IV, 2.7 x 3.6 x 2.5 cm ring enhancing; numerous small surrounding satellite lesions 12/13/11  . Headache(784.0)   . Hiatal hernia   . History of radiation therapy 01/07/2012-02/18/12   left temporal  58GY  . HLD (hyperlipidemia)   . PONV (postoperative nausea and vomiting)   . 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 BUNIONECTOMY  2001   right  . CATARACT EXTRACTION Left   . CRANIOTOMY  12/17/2011   Procedure: CRANIOTOMY TUMOR EXCISION;  Surgeon: Otilio Connors, MD;  Location: [facility];  Service: Neurosurgery;  Laterality: Left;  LEFT temporal craniotomy with stealth for tumor resection  . DILATION AND CURETTAGE OF UTERUS  2007  . TOTAL KNEE ARTHROPLASTY  2000   left  . VENTRAL HERNIA REPAIR N/A 09/03/2015   Procedure: LAPAROSCOPIC repair of incarcerated spigelian hernia with mesh, reduction and repair ;  Surgeon: [name], MD;  Location: [facility];  Service: General;  Laterality: N/A;   Social History:  Social History   Socioeconomic History  . Marital status: Married    Spouse name: Not on file  . Number of children: 3  . Years of education: Not on file  . Highest education level: Not on file  Occupational History  . Not on file  Social Needs  . Financial resource strain: Not on file  . Food insecurity:    Worry: Not on file    Inability: Not on file  . Transportation needs:    Medical: Not on file    Non-medical: Not on file  Tobacco Use  . Smoking status: Never Smoker  . Smokeless tobacco: Never Used  Substance and Sexual Activity  . Alcohol use: No  . Drug use: No  . Sexual activity: Yes  Lifestyle  . Physical activity:    Days per week: Not on file    Minutes per session: Not on file  . Stress: Not on file  Relationships  . Social connections:    Talks on phone: Not on file    Gets together:  Not on file    Attends religious service: Not on file    Active member of club or organization: Not on file    Attends meetings of clubs or organizations: Not on file    Relationship status: Not on file  . Intimate partner violence:    Fear of current or ex partner: Not on file    Emotionally abused: Not on file    Physically abused: Not on file    Forced sexual  activity: Not on file  Other Topics Concern  . Not on file  Social History Narrative   2nd marriage of 6 years, previously widowed   Family History:  Family History  Problem Relation Age of Onset  . Heart disease Mother     Review of Systems: Constitutional: Denies fevers, chills or abnormal weight loss Eyes: Denies blurriness of vision Ears, nose, mouth, throat, and face: dry mucosa, hoarseness Respiratory: Denies cough, dyspnea or wheezes Cardiovascular: Denies palpitation, chest discomfort or lower extremity swelling Gastrointestinal:  Denies nausea, constipation, diarrhea GU: Denies dysuria or incontinence Skin: Denies abnormal skin rashes Neurological: Per HPI Musculoskeletal: Denies joint pain, back or neck discomfort. No decrease in ROM Behavioral/Psych: Denies anxiety, disturbance in thought content, and mood instability  Physical Exam: Vitals:   06/20/17 1415  BP: 112/71  Pulse: 94  Resp: 18  Temp: 98.4 F (36.9 C)  SpO2: 96%   KPS: 80. General: Alert, cooperative, pleasant, in no acute distress Head: Normal EENT: No conjunctival injection or scleral icterus. Oral mucosa moist Lungs: Resp effort normal Cardiac: Regular rate and rhythm Abdomen: Soft, non-distended abdomen Skin: No rashes cyanosis or petechiae. Extremities: No clubbing or edema  Neurologic Exam: Mental Status: Awake, alert, attentive to examiner. Oriented to self and environment. Language is fluent with intact comprehension.  Age advanced psychomotor slowing with impaired short term memory. Cranial Nerves: Visual acuity is  grossly normal. Visual fields are full. Extra-ocular movements intact. Left eye ptosis, chronic. Face is symmetric, tongue midline. Motor: Tone and bulk are normal. Power is full in both arms and legs. Reflexes are symmetric, no pathologic reflexes present. Intact finger to nose bilaterally Sensory: Intact to light touch and temperature Gait: Slow, deliberate.  Unable to tandem   Labs: I have reviewed the data as listed    Component Value Date/Time   NA 136 06/14/2017 1359   NA 140 09/11/2013 1238   K 4.0 06/14/2017 1359   K 4.7 09/11/2013 1238   CL 106 06/14/2017 1359   CL 111 (H) 08/04/2012 1008   CO2 22 06/14/2017 1359   CO2 23 09/11/2013 1238   GLUCOSE 102 06/14/2017 1359   GLUCOSE 87 09/11/2013 1238   GLUCOSE 86 08/04/2012 1008   BUN 16 06/14/2017 1359   BUN 13.3 10/29/2014 1127   CREATININE 0.63 06/14/2017 1359   CREATININE 0.8 10/29/2014 1127   CALCIUM 9.5 06/14/2017 1359   CALCIUM 9.5 09/11/2013 1238   PROT 6.2 (L) 06/14/2017 1359   PROT 6.6 09/11/2013 1238   ALBUMIN 3.3 (L) 06/14/2017 1359   ALBUMIN 3.9 09/11/2013 1238   AST 13 06/14/2017 1359   AST 19 09/11/2013 1238   ALT 27 06/14/2017 1359   ALT 27 09/11/2013 1238   ALKPHOS 75 06/14/2017 1359   ALKPHOS 106 09/11/2013 1238   BILITOT 0.6 06/14/2017 1359   BILITOT 0.72 09/11/2013 1238   GFRNONAA >60 06/14/2017 1359   GFRAA >60 06/14/2017 1359   Lab Results  Component Value Date   WBC 8.6 06/14/2017   NEUTROABS 6.5 05/17/2017   HGB 13.8 05/11/2016   HCT 45.5 06/14/2017   MCV 94.0 06/14/2017   PLT 118 (L) 06/14/2017   Component     Latest Ref Rng & Units 06/14/2017  Vitamin D, 25-Hydroxy     30.0 - 100.0 ng/mL 19.8 (L)  Cortisol, Plasma     ug/dL 1.0  TSH     0.308 - 3.960 uIU/mL 1.495   Imaging:  [facility] Clinician Interpretation: I have personally reviewed  the CNS images as listed.  My interpretation, in the context of the patient's clinical presentation, is stable disease  Mr Jeri Cos Wo  Contrast  Result Date: 06/19/2017 CLINICAL DATA:  72 y/o F; gliosarcoma for follow-up. History of resection in 2013. Recent treatment with Avastin. EXAM: MRI HEAD WITHOUT AND WITH CONTRAST TECHNIQUE: Multiplanar, multiecho pulse sequences of the brain and surrounding structures were obtained without and with intravenous contrast. CONTRAST:  15mL MULTIHANCE GADOBENATE DIMEGLUMINE 529 MG/ML IV SOLN COMPARISON:  04/17/2017, 03/11/2017, 01/09/2017, 11/06/2016 MRI of the head. FINDINGS: Brain: Confluent T2 FLAIR hyperintense signal of white matter volume left temporal lobe, parietal lobe, and frontal lobe as well as the left external capsule which crosses corpus callosum to involve right frontal white matter. Distribution of T2 FLAIR signal is stable. Mass effect within the left anterior temporal lobe is decreased from the prior study. Enhancing lesions: 1. Left anterolateral temporal lobe, 20 x 12 x 11 mm, previously 27 x 16 x 17 mm (series 12: Image 23 and 13:17). 2. Left caudate nucleus, 15 mm, previously 8 mm (14:15). 3. Left superior temporal lobe, barely visible, previously punctate (12:29). 4. Left medial temporal lobe, no longer identified, previously punctate. The left caudate nucleus lesion which has increased in size demonstrates intermediate to low T2 signal and reduced diffusion (500:26 and 7:12) favoring neoplasm. No evidence for acute stroke, hemorrhage, or new area of mass effect. No extra-axial collection, hydrocephalus, or effacement of basilar cisterns. Vascular: Normal flow voids. Skull and upper cervical spine: Chronic postsurgical changes related to left temporal craniotomy. Sinuses/Orbits: Negative. Other: None. IMPRESSION: 1. No acute process identified. 2. Decreased enhancement and mass effect of the left lateral temporal lobe lesion may represent treatment response or Avastin effect. 3. Increased size of left caudate nucleus lesion demonstrating low T2 signal and reduced diffusion favoring  enhancing neoplasm. 4. Stable confluent T2 FLAIR signal abnormality throughout left hemisphere and right frontal white matter compatible with posttreatment changes and/or nonenhancing tumor. Electronically Signed   By: Kristine Garbe M.D.   On: 06/19/2017 18:57    Assessment/Plan 1. 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  We reviewed the results of the MRI at bedside in detail.  Vitamin D level was low; recommended supplementation with 1000IU daily for 3 months, then recheck level.   Should return in 1 week for next scheduled Avastin infusion.    We appreciate the opportunity to participate in the care of Kelly Maxwell.   All questions were answered. The patient knows to call the clinic with any problems, questions or concerns. No barriers to learning were detected.  The total time spent in the encounter was 25 minutes and more than 50% was on counseling and review of test results   Ventura Sellers, MD Medical Director of Neuro-Oncology [facility] 06/20/17 3:17 PM

## 2017-06-20 NOTE — Telephone Encounter (Signed)
Gave patient AVs and calendar of upcoming April appointments.  °

## 2017-06-20 NOTE — Telephone Encounter (Signed)
Received call from [facility] speech therapist requesting referral to [facility] for Pallative referral only for patient.  She states this was discussed with patient and family during one of her visits.  Requested that referral be faxed to [phone]

## 2017-06-24 ENCOUNTER — Telehealth: Payer: Self-pay

## 2017-06-24 ENCOUNTER — Other Ambulatory Visit: Payer: Self-pay | Admitting: Internal Medicine

## 2017-06-24 MED ORDER — LACOSAMIDE 50 MG PO TABS: 50.0000 mg | ORAL_TABLET | Freq: Two times a day (BID) | ORAL | 3 refills | Status: AC

## 2017-06-24 NOTE — Telephone Encounter (Signed)
Message left for patient to schedule visit with Palliative care

## 2017-06-25 ENCOUNTER — Ambulatory Visit: Payer: Medicare HMO | Admitting: Physical Therapy

## 2017-06-25 DIAGNOSIS — Z96652 Presence of left artificial knee joint: Secondary | ICD-10-CM | POA: Diagnosis not present

## 2017-06-25 DIAGNOSIS — R2689 Other abnormalities of gait and mobility: Secondary | ICD-10-CM | POA: Diagnosis not present

## 2017-06-25 DIAGNOSIS — E785 Hyperlipidemia, unspecified: Secondary | ICD-10-CM | POA: Diagnosis not present

## 2017-06-25 DIAGNOSIS — R69 Illness, unspecified: Secondary | ICD-10-CM | POA: Diagnosis not present

## 2017-06-25 DIAGNOSIS — K219 Gastro-esophageal reflux disease without esophagitis: Secondary | ICD-10-CM | POA: Diagnosis not present

## 2017-06-25 DIAGNOSIS — R4702 Dysphasia: Secondary | ICD-10-CM | POA: Diagnosis not present

## 2017-06-25 DIAGNOSIS — R413 Other amnesia: Secondary | ICD-10-CM | POA: Diagnosis not present

## 2017-06-25 DIAGNOSIS — R4701 Aphasia: Secondary | ICD-10-CM | POA: Diagnosis not present

## 2017-06-25 DIAGNOSIS — C712 Malignant neoplasm of temporal lobe: Secondary | ICD-10-CM | POA: Diagnosis not present

## 2017-06-26 ENCOUNTER — Other Ambulatory Visit: Payer: Self-pay | Admitting: Internal Medicine

## 2017-06-26 ENCOUNTER — Telehealth: Payer: Self-pay | Admitting: *Deleted

## 2017-06-26 DIAGNOSIS — R2689 Other abnormalities of gait and mobility: Secondary | ICD-10-CM

## 2017-06-26 DIAGNOSIS — C719 Malignant neoplasm of brain, unspecified: Secondary | ICD-10-CM

## 2017-06-26 NOTE — Telephone Encounter (Signed)
Pt's husband called requesting order for skilled nursing due to recent neurological decline.  Ordered per Dr. Mickeal Skinner.  Fax sent to [facility] with order and pt demographics.

## 2017-06-27 ENCOUNTER — Other Ambulatory Visit: Payer: Self-pay | Admitting: *Deleted

## 2017-06-27 ENCOUNTER — Ambulatory Visit: Payer: Medicare HMO | Admitting: Physical Therapy

## 2017-06-27 DIAGNOSIS — R4702 Dysphasia: Secondary | ICD-10-CM | POA: Diagnosis not present

## 2017-06-27 DIAGNOSIS — R4701 Aphasia: Secondary | ICD-10-CM | POA: Diagnosis not present

## 2017-06-27 DIAGNOSIS — K219 Gastro-esophageal reflux disease without esophagitis: Secondary | ICD-10-CM | POA: Diagnosis not present

## 2017-06-27 DIAGNOSIS — C719 Malignant neoplasm of brain, unspecified: Secondary | ICD-10-CM

## 2017-06-27 DIAGNOSIS — E785 Hyperlipidemia, unspecified: Secondary | ICD-10-CM | POA: Diagnosis not present

## 2017-06-27 DIAGNOSIS — C712 Malignant neoplasm of temporal lobe: Secondary | ICD-10-CM | POA: Diagnosis not present

## 2017-06-27 DIAGNOSIS — R2689 Other abnormalities of gait and mobility: Secondary | ICD-10-CM | POA: Diagnosis not present

## 2017-06-27 DIAGNOSIS — Z96652 Presence of left artificial knee joint: Secondary | ICD-10-CM | POA: Diagnosis not present

## 2017-06-27 DIAGNOSIS — R413 Other amnesia: Secondary | ICD-10-CM | POA: Diagnosis not present

## 2017-06-27 DIAGNOSIS — R69 Illness, unspecified: Secondary | ICD-10-CM | POA: Diagnosis not present

## 2017-06-28 ENCOUNTER — Other Ambulatory Visit: Payer: Self-pay

## 2017-06-28 ENCOUNTER — Inpatient Hospital Stay: Payer: Medicare HMO | Admitting: Internal Medicine

## 2017-06-28 ENCOUNTER — Other Ambulatory Visit: Payer: Self-pay | Admitting: Internal Medicine

## 2017-06-28 ENCOUNTER — Inpatient Hospital Stay: Payer: Medicare HMO

## 2017-06-28 ENCOUNTER — Ambulatory Visit: Payer: Medicare HMO

## 2017-06-28 VITALS — BP 147/82 | HR 83 | Temp 98.2°F | Resp 17

## 2017-06-28 DIAGNOSIS — Z9221 Personal history of antineoplastic chemotherapy: Secondary | ICD-10-CM | POA: Diagnosis not present

## 2017-06-28 DIAGNOSIS — C719 Malignant neoplasm of brain, unspecified: Secondary | ICD-10-CM

## 2017-06-28 DIAGNOSIS — C712 Malignant neoplasm of temporal lobe: Secondary | ICD-10-CM | POA: Diagnosis not present

## 2017-06-28 DIAGNOSIS — K219 Gastro-esophageal reflux disease without esophagitis: Secondary | ICD-10-CM | POA: Diagnosis not present

## 2017-06-28 DIAGNOSIS — E785 Hyperlipidemia, unspecified: Secondary | ICD-10-CM | POA: Diagnosis not present

## 2017-06-28 DIAGNOSIS — Z79899 Other long term (current) drug therapy: Secondary | ICD-10-CM | POA: Diagnosis not present

## 2017-06-28 DIAGNOSIS — R531 Weakness: Secondary | ICD-10-CM | POA: Diagnosis not present

## 2017-06-28 DIAGNOSIS — Z5112 Encounter for antineoplastic immunotherapy: Secondary | ICD-10-CM | POA: Diagnosis not present

## 2017-06-28 DIAGNOSIS — R490 Dysphonia: Secondary | ICD-10-CM | POA: Diagnosis not present

## 2017-06-28 DIAGNOSIS — R5383 Other fatigue: Secondary | ICD-10-CM | POA: Diagnosis not present

## 2017-06-28 DIAGNOSIS — Z923 Personal history of irradiation: Secondary | ICD-10-CM | POA: Diagnosis not present

## 2017-06-28 LAB — CBC (CANCER CENTER ONLY)
HEMATOCRIT: 43.4 % (ref 34.8–46.6)
HEMOGLOBIN: 14.7 g/dL (ref 11.6–15.9)
MCH: 31.7 pg (ref 25.1–34.0)
MCHC: 33.9 g/dL (ref 31.5–36.0)
MCV: 93.4 fL (ref 79.5–101.0)
Platelet Count: 103 10*3/uL — ABNORMAL LOW (ref 145–400)
RBC: 4.65 MIL/uL (ref 3.70–5.45)
RDW: 16.8 % — ABNORMAL HIGH (ref 11.2–14.5)
WBC Count: 8.3 10*3/uL (ref 3.9–10.3)

## 2017-06-28 LAB — URINALYSIS, COMPLETE (UACMP) WITH MICROSCOPIC
BACTERIA UA: NONE SEEN
BILIRUBIN URINE: NEGATIVE
Glucose, UA: NEGATIVE mg/dL
Hgb urine dipstick: NEGATIVE
Ketones, ur: NEGATIVE mg/dL
Leukocytes, UA: NEGATIVE
Nitrite: NEGATIVE
PH: 5 (ref 5.0–8.0)
Protein, ur: NEGATIVE mg/dL
SPECIFIC GRAVITY, URINE: 1.014 (ref 1.005–1.030)

## 2017-06-28 LAB — CMP (CANCER CENTER ONLY)
ALBUMIN: 3.4 g/dL — AB (ref 3.5–5.0)
ALK PHOS: 63 U/L (ref 40–150)
ALT: 28 U/L (ref 0–55)
AST: 18 U/L (ref 5–34)
Anion gap: 9 (ref 3–11)
BILIRUBIN TOTAL: 0.6 mg/dL (ref 0.2–1.2)
BUN: 16 mg/dL (ref 7–26)
CALCIUM: 9.6 mg/dL (ref 8.4–10.4)
CO2: 23 mmol/L (ref 22–29)
CREATININE: 0.6 mg/dL (ref 0.60–1.10)
Chloride: 109 mmol/L (ref 98–109)
GFR, Est AFR Am: >60 mL/min (ref >60)
GFR, Estimated: >60 mL/min (ref >60)
GLUCOSE: 99 mg/dL (ref 70–140)
Potassium: 3.9 mmol/L (ref 3.5–5.1)
SODIUM: 141 mmol/L (ref 136–145)
TOTAL PROTEIN: 6.1 g/dL — AB (ref 6.4–8.3)

## 2017-06-28 MED ORDER — BEVACIZUMAB CHEMO INJECTION 400 MG/16ML
10.0000 mg/kg | Freq: Once | INTRAVENOUS | Status: AC
  Administered 2017-06-28: 700 mg via INTRAVENOUS
  Filled 2017-06-28: qty 16

## 2017-06-28 MED ORDER — SODIUM CHLORIDE 0.9 % IV SOLN
Freq: Once | INTRAVENOUS | Status: AC
  Administered 2017-06-28: 14:00:00 via INTRAVENOUS

## 2017-06-28 NOTE — Patient Instructions (Signed)
[facility] Discharge Instructions for Patients Receiving Chemotherapy  Today you received the following chemotherapy agents Avastin  To help prevent nausea and vomiting after your treatment, we encourage you to take your nausea medication as directed   If you develop nausea and vomiting that is not controlled by your nausea medication, call the clinic.   BELOW ARE SYMPTOMS THAT SHOULD BE REPORTED IMMEDIATELY:  *FEVER GREATER THAN 100.5 F  *CHILLS WITH OR WITHOUT FEVER  NAUSEA AND VOMITING THAT IS NOT CONTROLLED WITH YOUR NAUSEA MEDICATION  *UNUSUAL SHORTNESS OF BREATH  *UNUSUAL BRUISING OR BLEEDING  TENDERNESS IN MOUTH AND THROAT WITH OR WITHOUT PRESENCE OF ULCERS  *URINARY PROBLEMS  *BOWEL PROBLEMS  UNUSUAL RASH Items with * indicate a potential emergency and should be followed up as soon as possible.  Feel free to call the clinic should you have any questions or concerns. The clinic phone number is [phone].  Please show the CHEMO ALERT CARD at check-in to the Emergency Department and triage nurse.   

## 2017-07-01 ENCOUNTER — Telehealth: Payer: Self-pay | Admitting: Internal Medicine

## 2017-07-01 NOTE — Telephone Encounter (Signed)
Appointments scheduled, left message on voicemail  With date/time per 4/26 sch msg

## 2017-07-02 ENCOUNTER — Ambulatory Visit: Payer: Medicare HMO | Admitting: Physical Therapy

## 2017-07-03 DIAGNOSIS — R350 Frequency of micturition: Secondary | ICD-10-CM | POA: Diagnosis not present

## 2017-07-04 ENCOUNTER — Telehealth: Payer: Self-pay

## 2017-07-04 ENCOUNTER — Ambulatory Visit: Payer: Medicare HMO | Admitting: Physical Therapy

## 2017-07-04 DIAGNOSIS — E785 Hyperlipidemia, unspecified: Secondary | ICD-10-CM | POA: Diagnosis not present

## 2017-07-04 DIAGNOSIS — R413 Other amnesia: Secondary | ICD-10-CM | POA: Diagnosis not present

## 2017-07-04 DIAGNOSIS — K219 Gastro-esophageal reflux disease without esophagitis: Secondary | ICD-10-CM | POA: Diagnosis not present

## 2017-07-04 DIAGNOSIS — Z96652 Presence of left artificial knee joint: Secondary | ICD-10-CM | POA: Diagnosis not present

## 2017-07-04 DIAGNOSIS — R4701 Aphasia: Secondary | ICD-10-CM | POA: Diagnosis not present

## 2017-07-04 DIAGNOSIS — R2689 Other abnormalities of gait and mobility: Secondary | ICD-10-CM | POA: Diagnosis not present

## 2017-07-04 DIAGNOSIS — C712 Malignant neoplasm of temporal lobe: Secondary | ICD-10-CM | POA: Diagnosis not present

## 2017-07-04 DIAGNOSIS — R69 Illness, unspecified: Secondary | ICD-10-CM | POA: Diagnosis not present

## 2017-07-04 DIAGNOSIS — R4702 Dysphasia: Secondary | ICD-10-CM | POA: Diagnosis not present

## 2017-07-04 NOTE — Telephone Encounter (Signed)
Received return call from patient's husband who shared that patient had demonstrated increased weakness and he is interested in scheduling a visit with Palliative care. Visit was scheduled for tomorrow 07/05/17 at 2 pm.

## 2017-07-05 ENCOUNTER — Other Ambulatory Visit: Payer: Medicare HMO | Admitting: Internal Medicine

## 2017-07-05 DIAGNOSIS — R531 Weakness: Secondary | ICD-10-CM

## 2017-07-06 NOTE — Progress Notes (Signed)
07/05/2017  PALLIATIVE CARE CONSULT Konrad Felix DOB: 08-28-1945. [address], [place] [address]  REFERRING PROVIDER: Dr. Cecil Cobbs [facility] (O) [phone]. PCP Dr. Wenda Low.  FAMILY: Husband Shanon Brow [phone] [facility]), [phone]), (daughter) Marjory Lies [phone] [place] referral sent  (06/26/17) by Dr. Mickeal Skinner for SN. Speech Therapy Ebony Hail)  BILLABLE  ICD-10: Weakness, generalized (R53.1)   IMPRESSION/RECOMMENDATIONS: 1. Baseline weakness/lethargy with recent exacerbation ? r/t recurrent UTI: Underlying  high grade Glioblastoma; diagnosed 2013; on third treatment regimen for progression.  Recent head MRI stable. Progression in functional decline over the prior 3-4 months, and exacerbated by recent UTI 3 weeks earlier. Completed treatment for UTI (Cipro 500 bid x 5d) 06/22/2017 with transient improvement of symptoms to her baseline. However, over last 2 days recurrent symptom of urinary burning, mild malaise, and lethargy. UA from 06/28/2017 negative. Patient's PCP Dr. Lysle Rubens consulted; Cipro 500 bid x 3 days. Order called into [facility] [phone]). Patient's daughter Marjory Lies updated.   2. Progressive Functional decline r/t  Glioblastoma: PPS currently weak 40%  form 60% 4 months earlier. Husband Shanon Brow and daughter Marjory Lies are aggressive in cheerleading patient on to maintain mobility/nutrition, but patient very unmotivated to even get out of bed.   a. Recommended hospital bed. Daughter Marjory Lies will consider. I outlined the steps for them on how to obtain (referral from Dr. Glenna Durand office)  b. We discussed potential for pressure injuries, and preventative measures.  3. Urinary incontinence: Followed by [facility]. Ongoing problem and source of frustration for patient and family. Discussed Foley Catheter placement; its' benefits vs burdens. Discussed frequent toileting (q 2-4 hours) to facilitate more complete bladder emptying, placing  bed side commode next to bed (currently in bathroom), and adding a diaper insert (in addition to currently utilized Depends) to help decrease overflow wetting of the bed. Family does have bed pads in place.  4. Advanced Care Planning: -Husband Shanon Brow and daughter Marjory Lies voice realization of poor long term prognosis, but are pushing as hard as they can to motivate patient/continue aggressive care in the hope of buying more time. We aligned what Dr. Isac Sarna physicians had shared with them regarding poor long term prognosis. Family desires full code/medical interventions. We did discuss what a code situation might look like, and its' sequoia. I reviewed/left written information regarding DNR/MOST form, and encouraged Shanon Brow and Marjory Lies to engage in ongoing discussion with patient's other two children. Daughter Marjory Lies is patient's HCPA.  -We discussed Hospice Care/criteria for admission/services provided, in some detail.  5. Follow up:  Will reach out in about 3-4 weeks to see if family wish a follow up visit  -Further discussion of advanced directives if family receptive  -LCSW consult; supportive counseling family stress coping with terminal illness.  HPI: 72 yo female with late stage left temporal glioblastoma. Family noting increasing lethargy, fatigue, and confusion.  Referred to Palliative Care for assistance with symptom management, help with coordination of resources, and ongoing discussions regarding goals of care/advanced directives  CODE STATUS:  PPS: weak 40% (from 60% 4 months earlier). Patient recalcitrant to follow exercises prescribed PT.  Decline in ambulation, from walking 20 min laps about the home at a brisk pace 4 months ago to now needing leaning assist, with slow shuffling gait. Three weeks earlier started to need assist with pushing herself up out of chair. Needs almost total assist with personal hygiene, dressing, and ambulation. Able to feed herself.  Appetite has declined so that  eats  2-3 small meals/d with protein drink supplements.  HOSPICE ELIGIBILITY:  yes, pending discontinuation of aggressive medical therapy.  MEDICATIONS :  Adderall 30mg  qd, bromfenea [employer] (Prolensa 0.07% Soln), Vit D, Klonopin 0.5mg  prn rare use, Decadron 2mg  bid, Difluprednate (Durezol) 0.05% Eul eye gtt  qid, lacosamide (Vimpat) 50 mg bidantiseizure, Lorazepam 0.5mg  (very rare use), Melatonin 1mg , ondasetron 8mg  (rareuse); prn. May have had nausea r/t past UTI, , Retin-A 0.05% cream, Venlafaxine XR (Effexor-XR) 75 mg qd. [employer]  ALLERGIES: Codeine (N/E), Succinylcholine Chloride, Compazine (rash)  PAST MEDICAL HISTORY:  Left temporal lobe gliosarcoma grade IV (Dx 2013; craniotomy resection 2013; XRT 2013; Chemo 2013/2015; progression #1 09/2015: metronomic temodar X 1 yr; progression #2 04/2017 recommended Avastin rx q2wks. Recent head MRI: radiographically stabel), Hiatial hernia, GERD, Pseudocholinesterase deficiency, Gliosarcoma left temporal lobe, hyperlipidemia, anxiety/depression,  PHYSICAL EXAM: Well nourished elderly, pleasantly conversant  female in NAD; laying on her side in bed. She abbreviated my interview; saying  she really didn't want to deal with a lot of questions. VS: BP 144/80, HR 62, sat 99% room air. HEENT: [place], AT.  LUNGS:  CTA bilaterally CARDIAC: RRR without MRG. Peripheral  pulses intact ABD: Soft, non-distended. NABS EXTREMITIES:  No pedal edema MUSCULOSKELETAL: gait is slow/deliberate SKIN: warm, dry and intact NEURO: A & O X 3; very sleepy, but awakens easily. Smiles with pleasant affect, but keeping eyes mostly closed.  Violeta Gelinas, NP-C

## 2017-07-09 ENCOUNTER — Other Ambulatory Visit: Payer: Self-pay | Admitting: Internal Medicine

## 2017-07-09 DIAGNOSIS — E785 Hyperlipidemia, unspecified: Secondary | ICD-10-CM | POA: Diagnosis not present

## 2017-07-09 DIAGNOSIS — R413 Other amnesia: Secondary | ICD-10-CM | POA: Diagnosis not present

## 2017-07-09 DIAGNOSIS — Z96652 Presence of left artificial knee joint: Secondary | ICD-10-CM | POA: Diagnosis not present

## 2017-07-09 DIAGNOSIS — C712 Malignant neoplasm of temporal lobe: Secondary | ICD-10-CM | POA: Diagnosis not present

## 2017-07-09 DIAGNOSIS — R69 Illness, unspecified: Secondary | ICD-10-CM | POA: Diagnosis not present

## 2017-07-09 DIAGNOSIS — R4702 Dysphasia: Secondary | ICD-10-CM | POA: Diagnosis not present

## 2017-07-09 DIAGNOSIS — R4701 Aphasia: Secondary | ICD-10-CM | POA: Diagnosis not present

## 2017-07-09 DIAGNOSIS — R2689 Other abnormalities of gait and mobility: Secondary | ICD-10-CM | POA: Diagnosis not present

## 2017-07-09 DIAGNOSIS — K219 Gastro-esophageal reflux disease without esophagitis: Secondary | ICD-10-CM | POA: Diagnosis not present

## 2017-07-09 MED ORDER — AMPHETAMINE-DEXTROAMPHETAMINE 30 MG PO TABS: 30.0000 mg | ORAL_TABLET | Freq: Every day | ORAL | 0 refills | Status: DC

## 2017-07-11 DIAGNOSIS — R4702 Dysphasia: Secondary | ICD-10-CM | POA: Diagnosis not present

## 2017-07-11 DIAGNOSIS — E785 Hyperlipidemia, unspecified: Secondary | ICD-10-CM | POA: Diagnosis not present

## 2017-07-11 DIAGNOSIS — R2689 Other abnormalities of gait and mobility: Secondary | ICD-10-CM | POA: Diagnosis not present

## 2017-07-11 DIAGNOSIS — R4701 Aphasia: Secondary | ICD-10-CM | POA: Diagnosis not present

## 2017-07-11 DIAGNOSIS — C712 Malignant neoplasm of temporal lobe: Secondary | ICD-10-CM | POA: Diagnosis not present

## 2017-07-11 DIAGNOSIS — R69 Illness, unspecified: Secondary | ICD-10-CM | POA: Diagnosis not present

## 2017-07-11 DIAGNOSIS — Z96652 Presence of left artificial knee joint: Secondary | ICD-10-CM | POA: Diagnosis not present

## 2017-07-11 DIAGNOSIS — K219 Gastro-esophageal reflux disease without esophagitis: Secondary | ICD-10-CM | POA: Diagnosis not present

## 2017-07-11 DIAGNOSIS — R413 Other amnesia: Secondary | ICD-10-CM | POA: Diagnosis not present

## 2017-07-12 ENCOUNTER — Inpatient Hospital Stay: Payer: Medicare HMO

## 2017-07-12 ENCOUNTER — Inpatient Hospital Stay (HOSPITAL_BASED_OUTPATIENT_CLINIC_OR_DEPARTMENT_OTHER): Payer: Medicare HMO | Admitting: Internal Medicine

## 2017-07-12 ENCOUNTER — Other Ambulatory Visit: Payer: Self-pay | Admitting: Internal Medicine

## 2017-07-12 ENCOUNTER — Inpatient Hospital Stay: Payer: Medicare HMO | Attending: Internal Medicine

## 2017-07-12 ENCOUNTER — Encounter: Payer: Self-pay | Admitting: Internal Medicine

## 2017-07-12 VITALS — BP 151/87 | HR 82 | Temp 98.2°F | Resp 17 | Ht 66.0 in | Wt 147.1 lb

## 2017-07-12 VITALS — BP 126/83

## 2017-07-12 DIAGNOSIS — C172 Malignant neoplasm of ileum: Secondary | ICD-10-CM | POA: Diagnosis not present

## 2017-07-12 DIAGNOSIS — R5383 Other fatigue: Secondary | ICD-10-CM | POA: Insufficient documentation

## 2017-07-12 DIAGNOSIS — R4189 Other symptoms and signs involving cognitive functions and awareness: Secondary | ICD-10-CM | POA: Insufficient documentation

## 2017-07-12 DIAGNOSIS — K219 Gastro-esophageal reflux disease without esophagitis: Secondary | ICD-10-CM | POA: Diagnosis not present

## 2017-07-12 DIAGNOSIS — E785 Hyperlipidemia, unspecified: Secondary | ICD-10-CM

## 2017-07-12 DIAGNOSIS — Z923 Personal history of irradiation: Secondary | ICD-10-CM

## 2017-07-12 DIAGNOSIS — C719 Malignant neoplasm of brain, unspecified: Secondary | ICD-10-CM

## 2017-07-12 DIAGNOSIS — G3189 Other specified degenerative diseases of nervous system: Secondary | ICD-10-CM

## 2017-07-12 DIAGNOSIS — Z79899 Other long term (current) drug therapy: Secondary | ICD-10-CM

## 2017-07-12 DIAGNOSIS — Z5112 Encounter for antineoplastic immunotherapy: Secondary | ICD-10-CM | POA: Insufficient documentation

## 2017-07-12 DIAGNOSIS — Z9221 Personal history of antineoplastic chemotherapy: Secondary | ICD-10-CM

## 2017-07-12 DIAGNOSIS — C712 Malignant neoplasm of temporal lobe: Secondary | ICD-10-CM | POA: Diagnosis not present

## 2017-07-12 DIAGNOSIS — R300 Dysuria: Secondary | ICD-10-CM | POA: Diagnosis not present

## 2017-07-12 LAB — TOTAL PROTEIN, URINE DIPSTICK: Protein, ur: <30 mg/dL

## 2017-07-12 LAB — IRON AND TIBC
Iron: 103 ug/dL (ref 41–142)
Saturation Ratios: 37 % (ref 21–57)
TIBC: 280 ug/dL (ref 236–444)
UIBC: 177 ug/dL

## 2017-07-12 LAB — VITAMIN B12: Vitamin B-12: 528 pg/mL (ref 180–914)

## 2017-07-12 LAB — FERRITIN: FERRITIN: 338 ng/mL — AB (ref 9–269)

## 2017-07-12 MED ORDER — CIPROFLOXACIN HCL 500 MG PO TABS: 500.0000 mg | ORAL_TABLET | Freq: Two times a day (BID) | ORAL | 0 refills | Status: DC

## 2017-07-12 MED ORDER — SODIUM CHLORIDE 0.9 % IV SOLN
Freq: Once | INTRAVENOUS | Status: AC
  Administered 2017-07-12: 14:00:00 via INTRAVENOUS

## 2017-07-12 MED ORDER — SODIUM CHLORIDE 0.9 % IV SOLN
10.0000 mg/kg | Freq: Once | INTRAVENOUS | Status: AC
  Administered 2017-07-12: 700 mg via INTRAVENOUS
  Filled 2017-07-12: qty 16

## 2017-07-12 MED ORDER — DONEPEZIL HCL 10 MG PO TABS: 10.0000 mg | ORAL_TABLET | Freq: Every day | ORAL | 3 refills | Status: DC

## 2017-07-12 NOTE — Progress Notes (Signed)
[place] [facility] [place], [place] [address] [phone]   Interval Evaluation  Date of Service: 07/12/17 Patient Name: Kelly Maxwell Patient MRN: [id number] Patient DOB: [birth year] Provider: Ventura Sellers, MD  Identifying Statement:  Kelly Maxwell is a 72 y.o. 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  Path demonstrates Gliosarcoma       - 02/18/2012 Radiation Therapy    Completes RT and concurrent Temodar with Dr. Tammi Klippel       - 09/14/2013 Chemotherapy    Completes 18 cycles of adjuvant Temodar, treated at [place].       09/29/2015 Progression    Progression #1.  Starts metronomic temodar 50mg /m2 daily       - 09/26/2016 Chemotherapy    Completes 1 year metronomic temodar       04/19/2017 Progression    Progression #2.  Recommendation for Avastin 10mg /kg q2 weeks       Interval History:  Kelly Maxwell presents today for Avastin.  She has continued to experience decline, now needing help with all ADLs.  Her language has declined as well, communication with her has become more challenging.  She often can't come up with correct words.  Still with significant fatigue as prior.  Family says she does acknowledge some burning with urination, but it's not clear she understands the question/concept.  No convulsive seizures or headaches.  Medications: Current Outpatient Medications on File Prior to Visit  Medication Sig Dispense Refill  . amphetamine-dextroamphetamine (ADDERALL) 30 MG tablet Take 1 tablet by mouth daily. 30 tablet 0  . Bromfenac Sodium (PROLENSA) 0.07 % SOLN Place 1 drop into the right eye 3 (three) times daily. Reported on 09/02/2015    . cholecalciferol (VITAMIN D) 1000 units tablet Take 1 tablet (1,000 Units  total) by mouth daily. 30 tablet 3  . clonazePAM (KLONOPIN) 0.5 MG tablet Take 0.5 mg by mouth at bedtime as needed for anxiety.    Marland Kitchen dexamethasone (DECADRON) 2 MG tablet Take 1 tablet (2 mg total) by mouth 2 (two) times daily. 60 tablet 2  . Difluprednate (DUREZOL) 0.05 % EMUL Apply 1 drop to eye 4 (four) times daily.    Marland Kitchen lacosamide (VIMPAT) 50 MG TABS tablet Take 1 tablet (50 mg total) by mouth 2 (two) times daily. 60 tablet 3  . LORazepam (ATIVAN) 0.5 MG tablet TAKE 1 TABLET 30 TO 60 MINUTES PRIOR TO MRI. MAY REPEAT ONCE FOR 1 ADDITIONAL RELIEF  0  . Melatonin 1 MG CAPS Take 2 capsules by mouth at bedtime as needed (sleep).     . ondansetron (ZOFRAN) 8 MG tablet Take 1 tab (8mg ) by mouth 1 hour before daily temozolomide. Take every 8 hours as needed for nausea.    Marland Kitchen oxybutynin (DITROPAN-XL) 10 MG 24 hr tablet     . tolterodine (DETROL) 2 MG tablet Take 4 mg by mouth daily.    Marland Kitchen tretinoin (RETIN-A) 0.05 % cream Apply 1 application topically at bedtime as needed. spots  0  . venlafaxine XR (EFFEXOR-XR) 37.5 MG 24 hr capsule Take 2 capsules by mouth daily.   3   No current facility-administered medications on file prior to visit.  Allergies:  Allergies  Allergen Reactions  . Codeine Nausea And Vomiting and Nausea Only  . 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    . Compazine [Prochlorperazine Edisylate] Other (See Comments) and Rash    Became hyper Became hyper   Past Medical History:  Past Medical History:  Diagnosis Date  . Cancer ([place])   . Depression   . GERD (gastroesophageal reflux disease)   . Glial neoplasm of brain ([place]) 12/17/11   left temporal, grade IV, 2.7 x 3.6 x 2.5 cm ring enhancing; numerous small surrounding satellite lesions 12/13/11  . Headache(784.0)   . Hiatal hernia   . History of radiation therapy 01/07/2012-02/18/12   left temporal  58GY  . HLD (hyperlipidemia)   . PONV  (postoperative nausea and vomiting)   . 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 BUNIONECTOMY  2001   right  . CATARACT EXTRACTION Left   . CRANIOTOMY  12/17/2011   Procedure: CRANIOTOMY TUMOR EXCISION;  Surgeon: Otilio Connors, MD;  Location: [facility];  Service: Neurosurgery;  Laterality: Left;  LEFT temporal craniotomy with stealth for tumor resection  . DILATION AND CURETTAGE OF UTERUS  2007  . TOTAL KNEE ARTHROPLASTY  2000   left  . VENTRAL HERNIA REPAIR N/A 09/03/2015   Procedure: LAPAROSCOPIC repair of incarcerated spigelian hernia with mesh, reduction and repair ;  Surgeon: [name], MD;  Location: [facility];  Service: General;  Laterality: N/A;   Social History:  Social History   Socioeconomic History  . Marital status: Married    Spouse name: Not on file  . Number of children: 3  . Years of education: Not on file  . Highest education level: Not on file  Occupational History  . Not on file  Social Needs  . Financial resource strain: Not on file  . Food insecurity:    Worry: Not on file    Inability: Not on file  . Transportation needs:    Medical: Not on file    Non-medical: Not on file  Tobacco Use  . Smoking status: Never Smoker  . Smokeless tobacco: Never Used  Substance and Sexual Activity  . Alcohol use: No  . Drug use: No  . Sexual activity: Yes  Lifestyle  . Physical activity:    Days per week: Not on file    Minutes per session: Not on file  . Stress: Not on file  Relationships  . Social connections:    Talks on phone: Not on file    Gets together: Not on file    Attends religious service: Not on file    Active member of club or organization: Not on file    Attends meetings of clubs or organizations: Not on file    Relationship status: Not on file  . Intimate partner violence:    Fear of current  or ex partner: Not on file    Emotionally abused: Not on file    Physically abused: Not on file    Forced sexual activity: Not on file  Other Topics Concern  . Not on file  Social History Narrative   2nd marriage of 6 years, previously widowed   Family History:  Family History  Problem Relation Age of Onset  . Heart disease Mother     Review of Systems:  Constitutional: Denies fevers, chills or abnormal weight loss Eyes: Denies blurriness of vision Ears, nose, mouth, throat, and face: dry mucosa, hoarseness Respiratory: Denies cough, dyspnea or wheezes Cardiovascular: Denies palpitation, chest discomfort or lower extremity swelling Gastrointestinal:  Denies nausea, constipation, diarrhea GU: Denies dysuria or incontinence Skin: Denies abnormal skin rashes Neurological: Per HPI Musculoskeletal: Denies joint pain, back or neck discomfort. No decrease in ROM Behavioral/Psych: Denies anxiety, disturbance in thought content, and mood instability  Physical Exam: Vitals:   07/12/17 1347  BP: (!) 151/87  Pulse: 82  Resp: 17  Temp: 98.2 F (36.8 C)  SpO2: 97%   KPS: 80. General: Alert, cooperative, pleasant, in no acute distress Head: Normal EENT: No conjunctival injection or scleral icterus. Oral mucosa moist Lungs: Resp effort normal Cardiac: Regular rate and rhythm Abdomen: Soft, non-distended abdomen Skin: No rashes cyanosis or petechiae. Extremities: No clubbing or edema  Neurologic Exam: Mental Status: Drowsy. Oriented to self but no environemnt.  Can't identify exit door in clinic room. Language is impaired to fluency and comprehension.  Struggles with some one step commands.  Difficulty saying full names of family members in room.  Age advanced psychomotor slowing with impaired short term memory. Cranial Nerves: Visual acuity is grossly normal. Visual fields are full. Extra-ocular movements intact. Left eye ptosis, chronic. Face is symmetric, tongue midline. Motor:  Tone and bulk are normal. Power is full in both arms and legs. Reflexes are symmetric, no pathologic reflexes present. Intact finger to nose bilaterally Sensory: Intact to light touch and temperature Gait: Deferred today   Labs: I have reviewed the data as listed    Component Value Date/Time   NA 141 06/28/2017 1248   NA 140 09/11/2013 1238   K 3.9 06/28/2017 1248   K 4.7 09/11/2013 1238   CL 109 06/28/2017 1248   CL 111 (H) 08/04/2012 1008   CO2 23 06/28/2017 1248   CO2 23 09/11/2013 1238   GLUCOSE 99 06/28/2017 1248   GLUCOSE 87 09/11/2013 1238   GLUCOSE 86 08/04/2012 1008   BUN 16 06/28/2017 1248   BUN 13.3 10/29/2014 1127   CREATININE 0.60 06/28/2017 1248   CREATININE 0.8 10/29/2014 1127   CALCIUM 9.6 06/28/2017 1248   CALCIUM 9.5 09/11/2013 1238   PROT 6.1 (L) 06/28/2017 1248   PROT 6.6 09/11/2013 1238   ALBUMIN 3.4 (L) 06/28/2017 1248   ALBUMIN 3.9 09/11/2013 1238   AST 18 06/28/2017 1248   AST 19 09/11/2013 1238   ALT 28 06/28/2017 1248   ALT 27 09/11/2013 1238   ALKPHOS 63 06/28/2017 1248   ALKPHOS 106 09/11/2013 1238   BILITOT 0.6 06/28/2017 1248   BILITOT 0.72 09/11/2013 1238   GFRNONAA >60 06/28/2017 1248   GFRAA >60 06/28/2017 1248   Lab Results  Component Value Date   WBC 8.3 06/28/2017   NEUTROABS 6.5 05/17/2017   HGB 14.7 06/28/2017   HCT 43.4 06/28/2017   MCV 93.4 06/28/2017   PLT 103 (L) 06/28/2017    Assessment/Plan 1. 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 Dearmas demonstrates clinical changes today.  These changes are understood in the context of progressive cortical atrophy and luekomalacia secondary to delayed treatment effects.  That said, there is at least some suspicion for undertreated UTI.  Avastin has helped control the enhancing portion of the tumor and is acting as a steroid sparing agent at this time.  There is also underlying possiblity of avastin-pseudoresponse and non-enhancing progression which could be  radiographically  obscured by leukoencephalopathy.  She is not a candidate for further cytotoxic chemotherapy at this time.   We will recommend complete course of antibiotics, ciprofloxacin 500mg  BID x7 days.    [place] with trial of Aricept, 10mg  HS for cognitive decline.  She is cleared to receive Avastin infusion today as scheduled, 10mg /kg IV.  Chemotherapy should be held for the following:  ANC less than 500  Platelets less than 50,000  LFT or creatinine greater than 2x ULN  If clinical concerns/contraindications develop  They will continue to follow closely with palliative care.  Hospital bed will be ordered today.  Should return in 2 weeks for next scheduled treatment or visit.    We appreciate the opportunity to participate in the care of Kelly Maxwell.   All questions were answered. The patient knows to call the clinic with any problems, questions or concerns. No barriers to learning were detected.  The total time spent in the encounter was 25 minutes and more than 50% was on counseling and review of test results   Ventura Sellers, MD Medical Director of Neuro-Oncology [facility] at [place] 07/12/17 1:29 PM

## 2017-07-12 NOTE — Patient Instructions (Signed)
[facility] Discharge Instructions for Patients Receiving Chemotherapy  Today you received the following chemotherapy agents Avastin  To help prevent nausea and vomiting after your treatment, we encourage you to take your nausea medication as directed   If you develop nausea and vomiting that is not controlled by your nausea medication, call the clinic.   BELOW ARE SYMPTOMS THAT SHOULD BE REPORTED IMMEDIATELY:  *FEVER GREATER THAN 100.5 F  *CHILLS WITH OR WITHOUT FEVER  NAUSEA AND VOMITING THAT IS NOT CONTROLLED WITH YOUR NAUSEA MEDICATION  *UNUSUAL SHORTNESS OF BREATH  *UNUSUAL BRUISING OR BLEEDING  TENDERNESS IN MOUTH AND THROAT WITH OR WITHOUT PRESENCE OF ULCERS  *URINARY PROBLEMS  *BOWEL PROBLEMS  UNUSUAL RASH Items with * indicate a potential emergency and should be followed up as soon as possible.  Feel free to call the clinic should you have any questions or concerns. The clinic phone number is [phone].  Please show the CHEMO ALERT CARD at check-in to the Emergency Department and triage nurse.   

## 2017-07-12 NOTE — Progress Notes (Signed)
Per Dr Mickeal Skinner ok to tx today w/o CBC or CMP being drawn.

## 2017-07-15 ENCOUNTER — Encounter (INDEPENDENT_AMBULATORY_CARE_PROVIDER_SITE_OTHER): Payer: Medicare HMO | Admitting: Ophthalmology

## 2017-07-15 ENCOUNTER — Telehealth: Payer: Self-pay | Admitting: *Deleted

## 2017-07-15 DIAGNOSIS — D3132 Benign neoplasm of left choroid: Secondary | ICD-10-CM

## 2017-07-15 DIAGNOSIS — H43813 Vitreous degeneration, bilateral: Secondary | ICD-10-CM

## 2017-07-15 DIAGNOSIS — H59032 Cystoid macular edema following cataract surgery, left eye: Secondary | ICD-10-CM

## 2017-07-15 DIAGNOSIS — H353121 Nonexudative age-related macular degeneration, left eye, early dry stage: Secondary | ICD-10-CM | POA: Diagnosis not present

## 2017-07-15 NOTE — Telephone Encounter (Signed)
Faxed order for hospital bed to [facility] [phone]

## 2017-07-16 ENCOUNTER — Emergency Department (HOSPITAL_COMMUNITY): Payer: Medicare HMO

## 2017-07-16 ENCOUNTER — Encounter (HOSPITAL_COMMUNITY): Payer: Self-pay

## 2017-07-16 ENCOUNTER — Other Ambulatory Visit: Payer: Self-pay

## 2017-07-16 ENCOUNTER — Telehealth: Payer: Self-pay | Admitting: *Deleted

## 2017-07-16 ENCOUNTER — Inpatient Hospital Stay (HOSPITAL_COMMUNITY)
Admission: EM | Admit: 2017-07-16 | Discharge: 2017-07-19 | DRG: 640 | Disposition: A | Discharge status: Home / Self Care | Payer: Medicare HMO | Attending: Internal Medicine | Admitting: Internal Medicine

## 2017-07-16 DIAGNOSIS — T441X5A Adverse effect of other parasympathomimetics [cholinergics], initial encounter: Secondary | ICD-10-CM | POA: Diagnosis present

## 2017-07-16 DIAGNOSIS — M6281 Muscle weakness (generalized): Secondary | ICD-10-CM | POA: Diagnosis not present

## 2017-07-16 DIAGNOSIS — Z923 Personal history of irradiation: Secondary | ICD-10-CM | POA: Diagnosis not present

## 2017-07-16 DIAGNOSIS — F419 Anxiety disorder, unspecified: Secondary | ICD-10-CM | POA: Diagnosis present

## 2017-07-16 DIAGNOSIS — T368X5A Adverse effect of other systemic antibiotics, initial encounter: Secondary | ICD-10-CM | POA: Diagnosis present

## 2017-07-16 DIAGNOSIS — K219 Gastro-esophageal reflux disease without esophagitis: Secondary | ICD-10-CM | POA: Diagnosis not present

## 2017-07-16 DIAGNOSIS — Z9221 Personal history of antineoplastic chemotherapy: Secondary | ICD-10-CM | POA: Diagnosis not present

## 2017-07-16 DIAGNOSIS — Z8249 Family history of ischemic heart disease and other diseases of the circulatory system: Secondary | ICD-10-CM | POA: Diagnosis not present

## 2017-07-16 DIAGNOSIS — G92 Toxic encephalopathy: Secondary | ICD-10-CM | POA: Diagnosis not present

## 2017-07-16 DIAGNOSIS — R69 Illness, unspecified: Secondary | ICD-10-CM | POA: Diagnosis not present

## 2017-07-16 DIAGNOSIS — F329 Major depressive disorder, single episode, unspecified: Secondary | ICD-10-CM | POA: Diagnosis present

## 2017-07-16 DIAGNOSIS — G9341 Metabolic encephalopathy: Secondary | ICD-10-CM | POA: Diagnosis not present

## 2017-07-16 DIAGNOSIS — R5383 Other fatigue: Secondary | ICD-10-CM | POA: Diagnosis not present

## 2017-07-16 DIAGNOSIS — I10 Essential (primary) hypertension: Secondary | ICD-10-CM | POA: Diagnosis present

## 2017-07-16 DIAGNOSIS — Z96652 Presence of left artificial knee joint: Secondary | ICD-10-CM | POA: Diagnosis present

## 2017-07-16 DIAGNOSIS — C719 Malignant neoplasm of brain, unspecified: Secondary | ICD-10-CM | POA: Diagnosis present

## 2017-07-16 DIAGNOSIS — N390 Urinary tract infection, site not specified: Secondary | ICD-10-CM | POA: Diagnosis not present

## 2017-07-16 DIAGNOSIS — R404 Transient alteration of awareness: Secondary | ICD-10-CM | POA: Diagnosis not present

## 2017-07-16 DIAGNOSIS — C712 Malignant neoplasm of temporal lobe: Secondary | ICD-10-CM | POA: Diagnosis present

## 2017-07-16 DIAGNOSIS — R269 Unspecified abnormalities of gait and mobility: Secondary | ICD-10-CM | POA: Diagnosis not present

## 2017-07-16 DIAGNOSIS — Z85841 Personal history of malignant neoplasm of brain: Secondary | ICD-10-CM | POA: Diagnosis not present

## 2017-07-16 DIAGNOSIS — R4182 Altered mental status, unspecified: Secondary | ICD-10-CM | POA: Diagnosis not present

## 2017-07-16 DIAGNOSIS — Y92009 Unspecified place in unspecified non-institutional (private) residence as the place of occurrence of the external cause: Secondary | ICD-10-CM

## 2017-07-16 DIAGNOSIS — F32A Depression, unspecified: Secondary | ICD-10-CM | POA: Diagnosis present

## 2017-07-16 DIAGNOSIS — E785 Hyperlipidemia, unspecified: Secondary | ICD-10-CM | POA: Diagnosis not present

## 2017-07-16 DIAGNOSIS — Z7952 Long term (current) use of systemic steroids: Secondary | ICD-10-CM

## 2017-07-16 DIAGNOSIS — R531 Weakness: Secondary | ICD-10-CM | POA: Diagnosis not present

## 2017-07-16 DIAGNOSIS — Z9181 History of falling: Secondary | ICD-10-CM | POA: Diagnosis not present

## 2017-07-16 DIAGNOSIS — K436 Other and unspecified ventral hernia with obstruction, without gangrene: Secondary | ICD-10-CM | POA: Diagnosis not present

## 2017-07-16 DIAGNOSIS — E860 Dehydration: Secondary | ICD-10-CM | POA: Diagnosis present

## 2017-07-16 LAB — URINALYSIS, ROUTINE W REFLEX MICROSCOPIC
BILIRUBIN URINE: NEGATIVE
Glucose, UA: NEGATIVE mg/dL
Hgb urine dipstick: NEGATIVE
Ketones, ur: 80 mg/dL — AB
Leukocytes, UA: NEGATIVE
NITRITE: NEGATIVE
PROTEIN: NEGATIVE mg/dL
SPECIFIC GRAVITY, URINE: 1.018 (ref 1.005–1.030)
pH: 6 (ref 5.0–8.0)

## 2017-07-16 LAB — CBC WITH DIFFERENTIAL/PLATELET
BASOS ABS: 0 10*3/uL (ref 0.0–0.1)
Basophils Relative: 0 %
EOS PCT: 0 %
Eosinophils Absolute: 0 10*3/uL (ref 0.0–0.7)
HCT: 42.9 % (ref 36.0–46.0)
Hemoglobin: 14.5 g/dL (ref 12.0–15.0)
Lymphocytes Relative: 8 %
Lymphs Abs: 0.8 10*3/uL (ref 0.7–4.0)
MCH: 31.9 pg (ref 26.0–34.0)
MCHC: 33.8 g/dL (ref 30.0–36.0)
MCV: 94.3 fL (ref 78.0–100.0)
MONO ABS: 0.3 10*3/uL (ref 0.1–1.0)
MONOS PCT: 3 %
NEUTROS PCT: 89 %
Neutro Abs: 8.7 10*3/uL — ABNORMAL HIGH (ref 1.7–7.7)
PLATELETS: 86 10*3/uL — AB (ref 150–400)
RBC: 4.55 MIL/uL (ref 3.87–5.11)
RDW: 17.2 % — ABNORMAL HIGH (ref 11.5–15.5)
WBC: 9.8 10*3/uL (ref 4.0–10.5)

## 2017-07-16 LAB — COMPREHENSIVE METABOLIC PANEL
ALBUMIN: 3.6 g/dL (ref 3.5–5.0)
ALT: 33 U/L (ref 14–54)
AST: 23 U/L (ref 15–41)
Alkaline Phosphatase: 63 U/L (ref 38–126)
Anion gap: 11 (ref 5–15)
BUN: 17 mg/dL (ref 6–20)
CHLORIDE: 107 mmol/L (ref 101–111)
CO2: 22 mmol/L (ref 22–32)
CREATININE: 0.43 mg/dL — AB (ref 0.44–1.00)
Calcium: 8.9 mg/dL (ref 8.9–10.3)
GFR calc Af Amer: >60 mL/min (ref >60)
GLUCOSE: 88 mg/dL (ref 65–99)
POTASSIUM: 4 mmol/L (ref 3.5–5.1)
Sodium: 140 mmol/L (ref 135–145)
Total Bilirubin: 1.4 mg/dL — ABNORMAL HIGH (ref 0.3–1.2)
Total Protein: 6.3 g/dL — ABNORMAL LOW (ref 6.5–8.1)

## 2017-07-16 LAB — I-STAT CG4 LACTIC ACID, ED
LACTIC ACID, VENOUS: 2.68 mmol/L — AB (ref 0.5–1.9)
Lactic Acid, Venous: 1.32 mmol/L (ref 0.5–1.9)
Lactic Acid, Venous: 2.8 mmol/L (ref 0.5–1.9)

## 2017-07-16 LAB — I-STAT TROPONIN, ED: Troponin i, poc: 0 ng/mL (ref 0.00–0.08)

## 2017-07-16 LAB — CBG MONITORING, ED: GLUCOSE-CAPILLARY: 77 mg/dL (ref 65–99)

## 2017-07-16 MED ORDER — ENOXAPARIN SODIUM 40 MG/0.4ML ~~LOC~~ SOLN
40.0000 mg | SUBCUTANEOUS | Status: DC
  Administered 2017-07-17 – 2017-07-19 (×3): 40 mg via SUBCUTANEOUS
  Filled 2017-07-16 (×3): qty 0.4

## 2017-07-16 MED ORDER — ALBUTEROL SULFATE (2.5 MG/3ML) 0.083% IN NEBU: 2.5000 mg | INHALATION_SOLUTION | Freq: Four times a day (QID) | RESPIRATORY_TRACT | Status: DC | PRN

## 2017-07-16 MED ORDER — VITAMIN D3 25 MCG (1000 UNIT) PO TABS
1000.0000 [IU] | ORAL_TABLET | Freq: Every day | ORAL | Status: DC
  Administered 2017-07-17 – 2017-07-19 (×3): 1000 [IU] via ORAL
  Filled 2017-07-16 (×3): qty 1

## 2017-07-16 MED ORDER — AMPHETAMINE-DEXTROAMPHETAMINE 10 MG PO TABS
30.0000 mg | ORAL_TABLET | Freq: Every day | ORAL | Status: DC
  Administered 2017-07-17 – 2017-07-19 (×3): 30 mg via ORAL
  Filled 2017-07-16 (×3): qty 3

## 2017-07-16 MED ORDER — DEXAMETHASONE 4 MG PO TABS
2.0000 mg | ORAL_TABLET | Freq: Two times a day (BID) | ORAL | Status: DC
Start: 2017-07-16 — End: 2017-07-19
  Administered 2017-07-17 – 2017-07-19 (×6): 2 mg via ORAL
  Filled 2017-07-16 (×6): qty 1

## 2017-07-16 MED ORDER — MAGNESIUM CITRATE PO SOLN: 1.0000 | Freq: Once | ORAL | Status: DC | PRN

## 2017-07-16 MED ORDER — KETOROLAC TROMETHAMINE 0.5 % OP SOLN
1.0000 [drp] | Freq: Four times a day (QID) | OPHTHALMIC | Status: DC
  Administered 2017-07-16 – 2017-07-18 (×9): 1 [drp] via OPHTHALMIC
  Filled 2017-07-16: qty 5

## 2017-07-16 MED ORDER — IPRATROPIUM BROMIDE 0.02 % IN SOLN: 0.5000 mg | Freq: Four times a day (QID) | RESPIRATORY_TRACT | Status: DC | PRN

## 2017-07-16 MED ORDER — VENLAFAXINE HCL ER 75 MG PO CP24
75.0000 mg | ORAL_CAPSULE | Freq: Every day | ORAL | Status: DC
  Administered 2017-07-17 – 2017-07-19 (×3): 75 mg via ORAL
  Filled 2017-07-16 (×3): qty 1

## 2017-07-16 MED ORDER — CIPROFLOXACIN IN D5W 400 MG/200ML IV SOLN
400.0000 mg | Freq: Two times a day (BID) | INTRAVENOUS | Status: DC
  Administered 2017-07-16: 400 mg via INTRAVENOUS
  Filled 2017-07-16: qty 200

## 2017-07-16 MED ORDER — ACETAMINOPHEN 650 MG RE SUPP: 650.0000 mg | Freq: Four times a day (QID) | RECTAL | Status: DC | PRN

## 2017-07-16 MED ORDER — BISACODYL 5 MG PO TBEC: 5.0000 mg | DELAYED_RELEASE_TABLET | Freq: Every day | ORAL | Status: DC | PRN

## 2017-07-16 MED ORDER — SODIUM CHLORIDE 0.9 % IV BOLUS
1000.0000 mL | Freq: Once | INTRAVENOUS | Status: AC
  Administered 2017-07-16: 1000 mL via INTRAVENOUS

## 2017-07-16 MED ORDER — ACETAMINOPHEN 325 MG PO TABS: 650.0000 mg | ORAL_TABLET | Freq: Four times a day (QID) | ORAL | Status: DC | PRN

## 2017-07-16 MED ORDER — SODIUM CHLORIDE 0.9 % IV SOLN
INTRAVENOUS | Status: DC
  Administered 2017-07-16 – 2017-07-17 (×2): via INTRAVENOUS

## 2017-07-16 MED ORDER — LACOSAMIDE 50 MG PO TABS
50.0000 mg | ORAL_TABLET | Freq: Two times a day (BID) | ORAL | Status: DC
  Administered 2017-07-17 – 2017-07-19 (×6): 50 mg via ORAL
  Filled 2017-07-16 (×6): qty 1

## 2017-07-16 MED ORDER — DIFLUPREDNATE 0.05 % OP EMUL
1.0000 [drp] | Freq: Four times a day (QID) | OPHTHALMIC | Status: DC
  Administered 2017-07-18 – 2017-07-19 (×5): 1 [drp] via OPHTHALMIC

## 2017-07-16 MED ORDER — SENNOSIDES-DOCUSATE SODIUM 8.6-50 MG PO TABS: 1.0000 | ORAL_TABLET | Freq: Every evening | ORAL | Status: DC | PRN

## 2017-07-16 MED ORDER — ONDANSETRON HCL 8 MG PO TABS: 8.0000 mg | ORAL_TABLET | Freq: Three times a day (TID) | ORAL | Status: DC | PRN

## 2017-07-16 NOTE — ED Notes (Signed)
Dr. Myrene Buddy made aware of critical lactic value

## 2017-07-16 NOTE — Progress Notes (Signed)
Pharmacy Antibiotic Note  Kelly Maxwell is a 72 y.o. female admitted on 07/16/2017 with UTI.  Pharmacy has been consulted for cipro dosing.  Plan: Cipro 400 mg IV q12h  F/u scr/cultures     Temp (24hrs), Avg:98 F (36.7 C), Min:98 F (36.7 C), Max:98 F (36.7 C)  Recent Labs  Lab 07/16/17 1745 07/16/17 1809 07/16/17 2044 07/16/17 2047  WBC 9.8  --   --   --   CREATININE 0.43*  --   --   --   LATICACIDVEN  --  1.32 2.68* 2.80*    Estimated Creatinine Clearance: 58.6 mL/min (A) (by C-G formula based on SCr of 0.43 mg/dL (L)).    Allergies  Allergen Reactions  . Codeine Nausea And Vomiting and Nausea Only  . 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    . Compazine [Prochlorperazine Edisylate] Other (See Comments) and Rash    Became hyper Became hyper    Antimicrobials this admission: 5/14 cipro >>    >>   Dose adjustments this admission:   Microbiology results:  BCx:   UCx:    Sputum:    MRSA PCR:   Thank you for allowing pharmacy to be a part of this patient's care.  Dorrene German 07/16/2017 10:40 PM

## 2017-07-16 NOTE — H&P (Signed)
History and Physical   [facility] [place] [facility] Admission History and Physical [employer], D.O.    Patient Name: Kelly Maxwell MR#: [id number] Date of Birth: 11/29/1945 Date of Admission: 07/16/2017  Referring MD/NP/PA: Dr. Ellender Hose Primary Care Physician: Wenda Low, MD  Chief Complaint:  Chief Complaint  Patient presents with  . Weakness  Please note the entire history is obtained from the patient's emergency department chart, emergency department provider and the patient's family who is at the bedside. Patient's personal history is limited by altered mental status.   HPI: Kelly Maxwell is a 72 y.o. female with a known history of glioblastoma diagnosed in 2013 status post radiation and chemotherapy, depression, hyperlipidemia presents to the emergency department for evaluation of weakness.  Patient was in a usual state of health until about 4 days ago when she started Aricept which her family believes has caused her to have increased confusion, lethargy, nausea, decreased by mouth intake.patient's husband states that she has intermittent episodes of confusion but the recent change is much worse than her usual fluctuations. She has not eaten anything today. Her husband states that she was seen in an emergency department in [place] on Palm Sunday and diagnosed with a urinary tract infection. She is taking Cipro which is prescribed through May 17. On questioning the patient is confused, answers some questions  EMS/ED Course: Patient received 2 L normal saline. Medical admission has been requested for further management of altered mental status, dehydration.  Review of Systems:  Unable to obtain secondary to altered mental status   Past Medical History:  Diagnosis Date  . Cancer ([place])   . Depression   . GERD (gastroesophageal reflux disease)   . 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 Headache(784.0)   . Hiatal hernia   . History of radiation therapy 01/07/2012-02/18/12   left temporal  60GY  . HLD (hyperlipidemia)   . PONV (postoperative nausea and vomiting)   . 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 BUNIONECTOMY  2001   right  . CATARACT EXTRACTION Left   . CRANIOTOMY  12/17/2011   Procedure: CRANIOTOMY TUMOR EXCISION;  Surgeon: Otilio Connors, MD;  Location: [facility];  Service: Neurosurgery;  Laterality: Left;  LEFT temporal craniotomy with stealth for tumor resection  . DILATION AND CURETTAGE OF UTERUS  2007  . TOTAL KNEE ARTHROPLASTY  2000   left  . VENTRAL HERNIA REPAIR N/A 09/03/2015   Procedure: LAPAROSCOPIC repair of incarcerated spigelian hernia with mesh, reduction and repair ;  Surgeon: [name], MD;  Location: [facility];  Service: General;  Laterality: N/A;     reports that she has never smoked. She has never used smokeless tobacco. She reports that she does not drink alcohol or use drugs.  Allergies  Allergen Reactions  . Codeine Nausea And Vomiting and Nausea Only  . 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    . Compazine [Prochlorperazine Edisylate] Other (See Comments) and Rash    Became hyper Became hyper    Family History  Problem Relation Age of Onset  . Heart disease Mother     Prior to Admission medications   Medication Sig Start Date End Date  Taking? Authorizing Provider  amphetamine-dextroamphetamine (ADDERALL) 30 MG tablet Take 1 tablet by mouth daily. 07/09/17  Yes Vaslow, Acey Lav, MD  Bromfenac Sodium (PROLENSA) 0.07 % SOLN Place 1 drop into the right eye 3 (three) times daily. Reported on 09/02/2015   Yes [provider]  cholecalciferol (VITAMIN D) 1000 units tablet Take  1 tablet (1,000 Units total) by mouth daily. 06/20/17  Yes Vaslow, Acey Lav, MD  ciprofloxacin (CIPRO) 500 MG tablet Take 1 tablet (500 mg total) by mouth 2 (two) times daily. 07/12/17  Yes Vaslow, Acey Lav, MD  clonazePAM (KLONOPIN) 0.5 MG tablet Take 0.5 mg by mouth at bedtime as needed for anxiety.   Yes [provider]  dexamethasone (DECADRON) 2 MG tablet Take 1 tablet (2 mg total) by mouth 2 (two) times daily. 05/31/17  Yes Vaslow, Acey Lav, MD  Difluprednate (DUREZOL) 0.05 % EMUL Apply 1 drop to eye 4 (four) times daily.   Yes [provider]  donepezil (ARICEPT) 10 MG tablet Take 1 tablet (10 mg total) by mouth at bedtime. 07/12/17  Yes Vaslow, Acey Lav, MD  lacosamide (VIMPAT) 50 MG TABS tablet Take 1 tablet (50 mg total) by mouth 2 (two) times daily. 06/24/17  Yes Vaslow, Acey Lav, MD  Melatonin 1 MG CAPS Take 2 capsules by mouth at bedtime as needed (sleep).    Yes [provider]  ondansetron (ZOFRAN) 8 MG tablet Take 1 tab (8mg ) by mouth 1 hour before daily temozolomide. Take every 8 hours as needed for nausea. 03/14/16  Yes [provider]  tretinoin (RETIN-A) 0.05 % cream Apply 1 application topically at bedtime as needed. spots 06/24/15  Yes [provider]  Venlafaxine HCl 37.5 MG TB24 Take 75 mg by mouth daily.  04/06/17  Yes [provider]  LORazepam (ATIVAN) 0.5 MG tablet TAKE 1 TABLET 30 TO 60 MINUTES PRIOR TO MRI. MAY REPEAT ONCE FOR 1 ADDITIONAL RELIEF 03/14/16   [provider]    Physical Exam: Vitals:   07/16/17 1721 07/16/17 1730 07/16/17 1800 07/16/17 1830  BP:  (!) 147/94 (!) 147/93 (!) 152/91  Pulse: 88 88 88 87  Resp: 20 12 (!) 24 (!) 27  Temp: 98 F (36.7 C)     TempSrc: Oral     SpO2: 98% 97% 98% 97%    GENERAL: 72 y.o.-year-old female patient, chronically ill-appearing,lying in the bed in no acute distress.  Awake and arousable but falls asleep during conversation, answers some questions and  participate with exam. HEENT: Head atraumatic, normocephalic. Pupils equal. Mucus membranes dry NECK: Supple, full range of motion. No JVD CHEST: Normal breath sounds bilaterally. No wheezing, rales, rhonchi or crackles. No use of accessory muscles of respiration.  No reproducible chest wall tenderness.  CARDIOVASCULAR: S1, S2 normal. No murmurs, rubs, or gallops. Cap refill <2 seconds. Pulses intact distally.  ABDOMEN: Soft, nondistended, nontender. No rebound, guarding, rigidity. Normoactive bowel sounds present in all four quadrants.  EXTREMITIES: trace edema. No cyanosis, or clubbing. No calf tenderness or Homan's sign.  NEUROLOGIC: The patient is alert and oriented x 1. PSYCHIATRIC:  Normal affect, mood, thought content. SKIN: Warm, dry, and intact without obvious rash, lesion, or ulcer.    Labs on Admission:  CBC: Recent Labs  Lab 07/16/17 1745  WBC 9.8  NEUTROABS 8.7*  HGB 14.5  HCT 42.9  MCV 94.3  PLT 86*   Basic Metabolic Panel: Recent Labs  Lab 07/16/17 1745  NA 140  K 4.0  CL  107  CO2 22  GLUCOSE 88  BUN 17  CREATININE 0.43*  CALCIUM 8.9   GFR: Estimated Creatinine Clearance: 58.6 mL/min (A) (by C-G formula based on SCr of 0.43 mg/dL (L)). Liver Function Tests: Recent Labs  Lab 07/16/17 1745  AST 23  ALT 33  ALKPHOS 63  BILITOT 1.4*  PROT 6.3*  ALBUMIN 3.6   No results for input(s): LIPASE, AMYLASE in the last 168 hours. No results for input(s): AMMONIA in the last 168 hours. Coagulation Profile: No results for input(s): INR, PROTIME in the last 168 hours. Cardiac Enzymes: No results for input(s): CKTOTAL, CKMB, CKMBINDEX, TROPONINI in the last 168 hours. BNP (last 3 results) No results for input(s): PROBNP in the last 8760 hours. HbA1C: No results for input(s): HGBA1C in the last 72 hours. CBG: Recent Labs  Lab 07/16/17 1727  GLUCAP 77   Lipid Profile: No results for input(s): CHOL, HDL, LDLCALC, TRIG, CHOLHDL, LDLDIRECT in the last 72  hours. Thyroid Function Tests: No results for input(s): TSH, T4TOTAL, FREET4, T3FREE, THYROIDAB in the last 72 hours. Anemia Panel: No results for input(s): VITAMINB12, FOLATE, FERRITIN, TIBC, IRON, RETICCTPCT in the last 72 hours. Urine analysis:    Component Value Date/Time   COLORURINE AMBER (A) 07/16/2017 1835   APPEARANCEUR CLOUDY (A) 07/16/2017 1835   LABSPEC 1.018 07/16/2017 1835   PHURINE 6.0 07/16/2017 1835   GLUCOSEU NEGATIVE 07/16/2017 1835   HGBUR NEGATIVE 07/16/2017 [place] 07/16/2017 1835   KETONESUR 80 (A) 07/16/2017 1835   PROTEINUR NEGATIVE 07/16/2017 1835   NITRITE NEGATIVE 07/16/2017 1835   LEUKOCYTESUR NEGATIVE 07/16/2017 1835   Sepsis Labs: @LABRCNTIP (procalcitonin:4,lacticidven:4) )No results found for this or any previous visit (from the past 240 hour(s)).   Radiological Exams on Admission: Dg Chest 2 View  Result Date: 07/16/2017 CLINICAL DATA:  Patient arrived via [facility] from home. Patient has a brain tumor (glial neoplasm) and being treated for chemo. Increased lethargy today. Altered mental status. EXAM: CHEST - 2 VIEW COMPARISON:  Chest x-ray dated 09/03/2015. FINDINGS: Heart size and mediastinal contours are stable. Probable mild bibasilar atelectasis, stable. Lungs otherwise clear. No pleural effusion or pneumothorax seen. Stable elevation of the RIGHT hemidiaphragm. Osseous structures about the chest are unremarkable. IMPRESSION: No active cardiopulmonary disease. No evidence of pneumonia or pulmonary edema. Electronically Signed   By: Franki Cabot M.D.   On: 07/16/2017 18:43   Ct Head Wo Contrast  Result Date: 07/16/2017 CLINICAL DATA:  History of brain tumor increased lethargy EXAM: CT HEAD WITHOUT CONTRAST TECHNIQUE: Contiguous axial images were obtained from the base of the skull through the vertex without intravenous contrast. COMPARISON:  06/19/2017, 04/17/2017, 03/11/2017 MRI, 01/09/2017 MRI, head CT 12/15/2011 FINDINGS: Brain:  No hemorrhage or large vessel infarct is seen. Extensive hypodensity in the left greater than white white matter, grossly similar distribution compared with most recent MRI from April 2019. Low-density foci in the left caudate and temporal lobe, corresponding to MRI demonstrated masses. Stable ventricle size. Vascular: No hyperdense vessels.  Carotid vascular calcification Skull: No fracture.  Left tempoparietal craniotomy. Sinuses/Orbits: No acute finding. Other: None IMPRESSION: 1. No definite CT evidence for acute intracranial abnormality. 2. Extensive white matter changes in the left greater than right hemisphere, grossly similar distribution compared to most recent MRI. Low-attenuation foci in the left caudate and temporal lobe presumably corresponding to the previously noted MRI masses. No significant mass effect or midline shift. 3. Atrophy Electronically Signed   By: Madie Reno.D.  On: 07/16/2017 19:10     Assessment/Plan  This is a 72 y.o. female with a history of glioblastoma diagnosed in 2013 status post radiation and chemotherapy, depression, hyperlipidemia now being admitted with:  #. Altered mental status likely multifactorial secondary to known glioblastoma, medication side effect, dehydration Admit inpatient IV fluid hydration Hold Aricept, Klonopin Neuro checks Nothing by mouth pending a bedside swallow eval  #.  Possible urinary tract infection UA may be unreliable as she has been on Cipro We'll give Cipro IV 400 every 12 Follow up urine culture  #. History of glioblastoma Continue dexamethasone, glucose,, Zofran, eyedrops  Admission status: inpatient IV Fluids: normal saline Diet/Nutrition: othing by mouth pending swallow Consults called: none  DVT Px: Lovenox, SCDs and early ambulation. Code Status: Full Code  Disposition Plan: To home in 1-2 days  All the records are reviewed and case discussed with ED provider. Management plans discussed with the patient  and/or family who express understanding and agree with plan of care.  Medard Decuir D.O. on 07/16/2017 at 8:33 PM CC: Primary care physician; Wenda Low, MD   07/16/2017, 8:33 PM

## 2017-07-16 NOTE — ED Notes (Signed)
ED TO INPATIENT HANDOFF REPORT  Name/Age/Gender Kelly Maxwell 72 y.o. female  Code Status Code Status History    Date Active Date Inactive Code Status Order ID Comments User Context   09/03/2015 0605 09/06/2015 1440 Full Code [id number]  [name], MD Inpatient    Advance Directive Documentation     Most Recent Value  Type of Advance Directive  Healthcare Power of Attorney, Living will, Out of facility DNR (pink MOST or yellow form)  Pre-existing out of facility DNR order (yellow form or pink MOST form)  -  "MOST" Form in Place?  -      Home/SNF/Other Home  Chief Complaint lath  Level of Care/Admitting Diagnosis ED Disposition    ED Disposition Condition [place]: [facility] [[id number]]  Level of Care: Med-Surg [16]  Diagnosis: Dehydration [276.51.ICD-9-CM]  Admitting Physician: [name] [[id number]]  Attending Physician: [name] [[id number]]  Estimated length of stay: past midnight tomorrow  Certification:: I certify this patient will need inpatient services for at least 2 midnights  PT Class (Do Not Modify): Inpatient [101]  PT Acc Code (Do Not Modify): Private [1]       Medical History Past Medical History:  Diagnosis Date  . Cancer ([place])   . Depression   . GERD (gastroesophageal reflux disease)   . 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 Headache(784.0)   . Hiatal hernia   . History of radiation therapy 01/07/2012-02/18/12   left temporal  60GY  . HLD (hyperlipidemia)   . PONV (postoperative nausea and vomiting)   . 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 Codeine Nausea And Vomiting and Nausea Only  . 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    . Compazine [Prochlorperazine Edisylate] Other (See Comments) and Rash    Became hyper Became hyper    IV Location/Drains/Wounds Patient Lines/Drains/Airways Status   Active Line/Drains/Airways    Name:   Placement date:   Placement time:   Site:   Days:   Peripheral IV 07/16/17 Left Hand   07/16/17    2036    Hand   less than 1   Incision (Closed) 09/03/15 Abdomen   09/03/15    0353     682   Incision - 3 Ports Abdomen 1: Right;Lower 2: Right;Mid 3: Right;Upper   09/03/15    0352     682          Labs/Imaging Results for orders placed or performed during the hospital encounter of 07/16/17 (from the past 48 hour(s))  CBG monitoring, ED     Status: None   Collection Time: 07/16/17  5:27 PM  Result Value Ref Range   Glucose-Capillary 77 65 - 99 mg/dL  CBC with Differential     Status: Abnormal   Collection Time: 07/16/17  5:45 PM  Result Value Ref Range   WBC 9.8 4.0 - 10.5 K/uL   RBC 4.55 3.87 - 5.11 MIL/uL   Hemoglobin 14.5 12.0 - 15.0 g/dL   HCT 42.9 36.0 - 46.0 %   MCV 94.3 78.0 - 100.0 fL   MCH 31.9 26.0 - 34.0 pg   MCHC 33.8 30.0 - 36.0 g/dL  RDW 17.2 (H) 11.5 - 15.5 %   Platelets 86 (L) 150 - 400 K/uL    Comment: REPEATED TO VERIFY SPECIMEN CHECKED FOR CLOTS PLATELET COUNT CONFIRMED BY SMEAR    Neutrophils Relative % 89 %   Lymphocytes Relative 8 %   Monocytes Relative 3 %   Eosinophils Relative 0 %   Basophils Relative 0 %   Neutro Abs 8.7 (H) 1.7 - 7.7 K/uL   Lymphs Abs 0.8 0.7 - 4.0 K/uL   Monocytes Absolute 0.3 0.1 - 1.0 K/uL   Eosinophils Absolute 0.0 0.0 - 0.7 K/uL   Basophils Absolute 0.0 0.0 - 0.1 K/uL   WBC Morphology MILD LEFT SHIFT (1-5% METAS, OCC MYELO, OCC BANDS)     Comment: TOXIC GRANULATION Performed at [facility] [address] [place], [place] [address]   Comprehensive metabolic panel     Status: Abnormal   Collection Time: 07/16/17  5:45 PM   Result Value Ref Range   Sodium 140 135 - 145 mmol/L   Potassium 4.0 3.5 - 5.1 mmol/L   Chloride 107 101 - 111 mmol/L   CO2 22 22 - 32 mmol/L   Glucose, Bld 88 65 - 99 mg/dL   BUN 17 6 - 20 mg/dL   Creatinine, Ser 0.43 (L) 0.44 - 1.00 mg/dL   Calcium 8.9 8.9 - 10.3 mg/dL   Total Protein 6.3 (L) 6.5 - 8.1 g/dL   Albumin 3.6 3.5 - 5.0 g/dL   AST 23 15 - 41 U/L   ALT 33 14 - 54 U/L   Alkaline Phosphatase 63 38 - 126 U/L   Total Bilirubin 1.4 (H) 0.3 - 1.2 mg/dL   GFR calc non Af Amer >60 >60 mL/min   GFR calc Af Amer >60 >60 mL/min    Comment: (NOTE) The eGFR has been calculated using the CKD EPI equation. This calculation has not been validated in all clinical situations. eGFR's persistently <60 mL/min signify possible Chronic Kidney Disease.    Anion gap 11 5 - 15    Comment: Performed at [facility], [place] [address], [place], [place] [address]  I-Stat Troponin, ED (not at [facility])     Status: None   Collection Time: 07/16/17  6:08 PM  Result Value Ref Range   Troponin i, poc 0.00 0.00 - 0.08 ng/mL   Comment 3            Comment: Due to the release kinetics of cTnI, a negative result within the first hours of the onset of symptoms does not rule out myocardial infarction with certainty. If myocardial infarction is still suspected, repeat the test at appropriate intervals.   I-Stat CG4 Lactic Acid, ED     Status: None   Collection Time: 07/16/17  6:09 PM  Result Value Ref Range   Lactic Acid, Venous 1.32 0.5 - 1.9 mmol/L  Urinalysis, Routine w reflex microscopic     Status: Abnormal   Collection Time: 07/16/17  6:35 PM  Result Value Ref Range   Color, Urine AMBER (A) YELLOW    Comment: BIOCHEMICALS MAY BE AFFECTED BY COLOR   APPearance CLOUDY (A) CLEAR   Specific Gravity, Urine 1.018 1.005 - 1.030   pH 6.0 5.0 - 8.0   Glucose, UA NEGATIVE NEGATIVE mg/dL   Hgb urine dipstick NEGATIVE NEGATIVE   Bilirubin Urine NEGATIVE NEGATIVE   Ketones, ur 80 (A)  NEGATIVE mg/dL   Protein, ur NEGATIVE NEGATIVE mg/dL   Nitrite NEGATIVE NEGATIVE  Leukocytes, UA NEGATIVE NEGATIVE    Comment: Performed at [facility], [place] [address] [place], [place] [address]  I-Stat CG4 Lactic Acid, ED     Status: Abnormal   Collection Time: 07/16/17  8:44 PM  Result Value Ref Range   Lactic Acid, Venous 2.68 (HH) 0.5 - 1.9 mmol/L   Comment NOTIFIED PHYSICIAN   I-Stat CG4 Lactic Acid, ED     Status: Abnormal   Collection Time: 07/16/17  8:47 PM  Result Value Ref Range   Lactic Acid, Venous 2.80 (HH) 0.5 - 1.9 mmol/L   Comment NOTIFIED PHYSICIAN    Dg Chest 2 View  Result Date: 07/16/2017 CLINICAL DATA:  Patient arrived via [facility] from home. Patient has a brain tumor (glial neoplasm) and being treated for chemo. Increased lethargy today. Altered mental status. EXAM: CHEST - 2 VIEW COMPARISON:  Chest x-ray dated 09/03/2015. FINDINGS: Heart size and mediastinal contours are stable. Probable mild bibasilar atelectasis, stable. Lungs otherwise clear. No pleural effusion or pneumothorax seen. Stable elevation of the RIGHT hemidiaphragm. Osseous structures about the chest are unremarkable. IMPRESSION: No active cardiopulmonary disease. No evidence of pneumonia or pulmonary edema. Electronically Signed   By: Franki Cabot M.D.   On: 07/16/2017 18:43   Ct Head Wo Contrast  Result Date: 07/16/2017 CLINICAL DATA:  History of brain tumor increased lethargy EXAM: CT HEAD WITHOUT CONTRAST TECHNIQUE: Contiguous axial images were obtained from the base of the skull through the vertex without intravenous contrast. COMPARISON:  06/19/2017, 04/17/2017, 03/11/2017 MRI, 01/09/2017 MRI, head CT 12/15/2011 FINDINGS: Brain: No hemorrhage or large vessel infarct is seen. Extensive hypodensity in the left greater than white white matter, grossly similar distribution compared with most recent MRI from April 2019. Low-density foci in the left caudate and temporal lobe,  corresponding to MRI demonstrated masses. Stable ventricle size. Vascular: No hyperdense vessels.  Carotid vascular calcification Skull: No fracture.  Left tempoparietal craniotomy. Sinuses/Orbits: No acute finding. Other: None IMPRESSION: 1. No definite CT evidence for acute intracranial abnormality. 2. Extensive white matter changes in the left greater than right hemisphere, grossly similar distribution compared to most recent MRI. Low-attenuation foci in the left caudate and temporal lobe presumably corresponding to the previously noted MRI masses. No significant mass effect or midline shift. 3. Atrophy Electronically Signed   By: Donavan Foil M.D.   On: 07/16/2017 19:10    Pending Labs [employer] (From admission, onward)   Start     Ordered   Signed and Held  CBC  (enoxaparin (LOVENOX)    CrCl >/= 30 ml/min)  Once,   R    Comments:  Baseline for enoxaparin therapy IF NOT ALREADY DRAWN.  Notify MD if PLT < 100 K.    Signed and Held   Signed and Held  Creatinine, serum  (enoxaparin (LOVENOX)    CrCl >/= 30 ml/min)  Once,   R    Comments:  Baseline for enoxaparin therapy IF NOT ALREADY DRAWN.    Signed and Held   Signed and Held  Creatinine, serum  (enoxaparin (LOVENOX)    CrCl >/= 30 ml/min)  Weekly,   R    Comments:  while on enoxaparin therapy    Signed and Held   Signed and Held  Basic metabolic panel  Tomorrow morning,   R     Signed and Held   Signed and Held  CBC  Tomorrow morning,   R     Signed and Held  Vitals/Pain Today's Vitals   07/16/17 1721 07/16/17 1730 07/16/17 1800 07/16/17 1830  BP:  (!) 147/94 (!) 147/93 (!) 152/91  Pulse: 88 88 88 87  Resp: 20 12 (!) 24 (!) 27  Temp: 98 F (36.7 C)     TempSrc: Oral     SpO2: 98% 97% 98% 97%    Isolation Precautions No active isolations  Medications Medications  sodium chloride 0.9 % bolus 1,000 mL (1,000 mLs Intravenous New Bag/Given 07/16/17 2041)  sodium chloride 0.9 % bolus 1,000 mL (0 mLs Intravenous  Stopped 07/16/17 1913)    Mobility walks with person assist

## 2017-07-16 NOTE — ED Notes (Signed)
Bed: XG33 Expected date:  Expected time:  Means of arrival:  Comments: EMS-lethargy/brain tumor

## 2017-07-16 NOTE — ED Triage Notes (Addendum)
Patient arrived via [facility] from home. Patient has a brain tumor and being treated for chemo. Baseline is to be talking and can get up and about with assistance per fmaily. Today patient is more lethargic and not answering questions and no appetitite today. Temp-96.7. bp-142/90, 97% room air, p-94, rr-16. CBG-87

## 2017-07-16 NOTE — ED Provider Notes (Signed)
[facility] DEPT Provider Note   CSN: [id number] Arrival date & time: 07/16/17  1655     History   Chief Complaint Chief Complaint  Patient presents with  . Weakness    HPI Kelly Maxwell is a 72 y.o. female.  HPI  72 year old female with past medical history as below including glioblastoma followed by Dr. Mickeal Skinner here with generalized weakness.  Patient reportedly was recently started on Aricept on Friday.  Since then, she is had nausea and decreased intake.  Over the last 24 hours, she has essentially refused to eat or drink anything.  She is had increasing confusion.  Family could not get her out of bed today.  She was drowsy and falling asleep and could not participate in conversation.  They subsequently present for evaluation.  Patient drowsy per EMS.  On my assessment, she does not recall why she is here.  She does appear confused and easily falls asleep during exam.  Level 5 caveat invoked as remainder of history, ROS, and physical exam limited due to patient's mental status.   Past Medical History:  Diagnosis Date  . Cancer ([place])   . Depression   . GERD (gastroesophageal reflux disease)   . 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 Headache(784.0)   . Hiatal hernia   . History of radiation therapy 01/07/2012-02/18/12   left temporal  58GY  . HLD (hyperlipidemia)   . PONV (postoperative nausea and vomiting)   . 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 Dehydration 07/16/2017  . 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 Anxiety and depression 09/03/2015  . Personal history of other infectious and parasitic diseases 09/03/2015  . Pseudocholinesterase  deficiency 09/03/2015  . HLD (hyperlipidemia)   . GERD (gastroesophageal reflux disease)   . PONV (postoperative nausea and vomiting)   . 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 Hiatal hernia     Past Surgical History:  Procedure Laterality Date  . BUNIONECTOMY  2001   right  . CATARACT EXTRACTION Left   . CRANIOTOMY  12/17/2011   Procedure: CRANIOTOMY TUMOR EXCISION;  Surgeon: Otilio Connors, MD;  Location: [facility];  Service: Neurosurgery;  Laterality: Left;  LEFT temporal craniotomy with stealth for tumor resection  . DILATION AND CURETTAGE OF UTERUS  2007  . TOTAL KNEE ARTHROPLASTY  2000   left  . VENTRAL HERNIA REPAIR N/A 09/03/2015   Procedure: LAPAROSCOPIC repair of incarcerated spigelian hernia with mesh, reduction and repair ;  Surgeon: [name], MD;  Location: [facility];  Service: General;  Laterality: N/A;     OB History   None      Home Medications    Prior to Admission medications   Medication Sig Start Date End Date Taking? Authorizing Provider  amphetamine-dextroamphetamine (ADDERALL) 30 MG tablet Take 1 tablet by mouth daily. 07/09/17  Yes Vaslow, Acey Lav, MD  Bromfenac Sodium (PROLENSA) 0.07 % SOLN Place 1 drop into the right eye 3 (three) times daily. Reported on 09/02/2015   Yes [provider]  cholecalciferol (VITAMIN D) 1000 units tablet Take 1 tablet (1,000 Units total) by mouth daily. 06/20/17  Yes Vaslow, Acey Lav, MD  ciprofloxacin (CIPRO) 500 MG tablet Take 1 tablet (500 mg total) by mouth 2 (two) times daily. 07/12/17  Yes Vaslow, Acey Lav, MD  clonazePAM (KLONOPIN) 0.5 MG tablet Take 0.5 mg by mouth at bedtime as needed for anxiety.   Yes [provider]  dexamethasone (DECADRON) 2 MG tablet Take 1 tablet (2 mg total) by mouth 2 (two) times daily. 05/31/17  Yes Vaslow, Acey Lav, MD  Difluprednate (DUREZOL) 0.05 % EMUL Apply 1 drop to eye 4 (four) times daily.   Yes [provider]  donepezil (ARICEPT) 10 MG  tablet Take 1 tablet (10 mg total) by mouth at bedtime. 07/12/17  Yes Vaslow, Acey Lav, MD  lacosamide (VIMPAT) 50 MG TABS tablet Take 1 tablet (50 mg total) by mouth 2 (two) times daily. 06/24/17  Yes Vaslow, Acey Lav, MD  Melatonin 1 MG CAPS Take 2 capsules by mouth at bedtime as needed (sleep).    Yes [provider]  ondansetron (ZOFRAN) 8 MG tablet Take 1 tab (8mg ) by mouth 1 hour before daily temozolomide. Take every 8 hours as needed for nausea. 03/14/16  Yes [provider]  tretinoin (RETIN-A) 0.05 % cream Apply 1 application topically at bedtime as needed. spots 06/24/15  Yes [provider]  Venlafaxine HCl 37.5 MG TB24 Take 75 mg by mouth daily.  04/06/17  Yes [provider]  LORazepam (ATIVAN) 0.5 MG tablet TAKE 1 TABLET 30 TO 60 MINUTES PRIOR TO MRI. MAY REPEAT ONCE FOR 1 ADDITIONAL RELIEF 03/14/16   [provider]    Family History Family History  Problem Relation Age of Onset  . Heart disease Mother     Social History Social History   Tobacco Use  . Smoking status: Never Smoker  . Smokeless tobacco: Never Used  Substance Use Topics  . Alcohol use: No  . Drug use: No     Allergies   Codeine; Succinylcholine chloride; and Compazine [prochlorperazine edisylate]   Review of Systems Review of Systems  Constitutional: Positive for fatigue. Negative for chills and fever.  HENT: Negative for congestion and rhinorrhea.   Eyes: Negative for visual disturbance.  Respiratory: Negative for cough, shortness of breath and wheezing.   Cardiovascular: Negative for chest pain and leg swelling.  Gastrointestinal: Negative for abdominal pain, diarrhea, nausea and vomiting.  Genitourinary: Negative for dysuria and flank pain.  Musculoskeletal: Negative for neck pain and neck stiffness.  Skin: Negative for rash and wound.  Allergic/Immunologic: Negative for immunocompromised state.  Neurological: Positive for weakness. Negative for  syncope and headaches.  All other systems reviewed and are negative.    Physical Exam Updated Vital Signs BP (!) 149/93 (BP Location: Left Arm)   Pulse 66   Temp 97.7 F (36.5 C) (Oral)   Resp 20   Ht 5\' 5"  (1.651 m)   Wt 69 kg (152 lb 1.9 oz)   SpO2 99%   BMI 25.31 kg/m   Physical Exam  Constitutional: She appears well-developed and well-nourished. No distress.  HENT:  Head: Normocephalic and atraumatic.  Markedly dry MM  Eyes: Conjunctivae are normal.  Neck: Neck supple.  Cardiovascular: Normal rate, regular rhythm and normal heart sounds. Exam reveals no friction rub.  No murmur heard. Pulmonary/Chest: Effort normal and breath sounds normal. No respiratory distress. She has no wheezes. She has no rales.  Abdominal: She exhibits no distension.  Musculoskeletal: She exhibits no edema.  Neurological: She exhibits normal muscle tone.  Drowsy, easily falls asleep. Face symmetric. Strength 5/5  bilateral Ue and LE. Normal sensation to light touch.  Skin: Skin is warm. Capillary refill takes less than 2 seconds.  Psychiatric: She has a normal mood and affect.  Nursing note and vitals reviewed.    ED Treatments / Results  Labs (all labs ordered are listed, but only abnormal results are displayed) Labs Reviewed  URINALYSIS, ROUTINE W REFLEX MICROSCOPIC - Abnormal; Notable for the following components:      Result Value   Color, Urine AMBER (*)    APPearance CLOUDY (*)    Ketones, ur 80 (*)    All other components within normal limits  CBC WITH DIFFERENTIAL/PLATELET - Abnormal; Notable for the following components:   RDW 17.2 (*)    Platelets 86 (*)    Neutro Abs 8.7 (*)    All other components within normal limits  COMPREHENSIVE METABOLIC PANEL - Abnormal; Notable for the following components:   Creatinine, Ser 0.43 (*)    Total Protein 6.3 (*)    Total Bilirubin 1.4 (*)    All other components within normal limits  I-STAT CG4 LACTIC ACID, ED - Abnormal; Notable for  the following components:   Lactic Acid, Venous 2.80 (*)    All other components within normal limits  I-STAT CG4 LACTIC ACID, ED - Abnormal; Notable for the following components:   Lactic Acid, Venous 2.68 (*)    All other components within normal limits  BASIC METABOLIC PANEL  CBC  I-STAT CG4 LACTIC ACID, ED  I-STAT TROPONIN, ED  CBG MONITORING, ED    EKG None  Radiology Dg Chest 2 View  Result Date: 07/16/2017 CLINICAL DATA:  Patient arrived via [facility] from home. Patient has a brain tumor (glial neoplasm) and being treated for chemo. Increased lethargy today. Altered mental status. EXAM: CHEST - 2 VIEW COMPARISON:  Chest x-ray dated 09/03/2015. FINDINGS: Heart size and mediastinal contours are stable. Probable mild bibasilar atelectasis, stable. Lungs otherwise clear. No pleural effusion or pneumothorax seen. Stable elevation of the RIGHT hemidiaphragm. Osseous structures about the chest are unremarkable. IMPRESSION: No active cardiopulmonary disease. No evidence of pneumonia or pulmonary edema. Electronically Signed   By: Franki Cabot M.D.   On: 07/16/2017 18:43   Ct Head Wo Contrast  Result Date: 07/16/2017 CLINICAL DATA:  History of brain tumor increased lethargy EXAM: CT HEAD WITHOUT CONTRAST TECHNIQUE: Contiguous axial images were obtained from the base of the skull through the vertex without intravenous contrast. COMPARISON:  06/19/2017, 04/17/2017, 03/11/2017 MRI, 01/09/2017 MRI, head CT 12/15/2011 FINDINGS: Brain: No hemorrhage or large vessel infarct is seen. Extensive hypodensity in the left greater than white white matter, grossly similar distribution compared with most recent MRI from April 2019. Low-density foci in the left caudate and temporal lobe, corresponding to MRI demonstrated masses. Stable ventricle size. Vascular: No hyperdense vessels.  Carotid vascular calcification Skull: No fracture.  Left tempoparietal craniotomy. Sinuses/Orbits: No acute finding. Other: None  IMPRESSION: 1. No definite CT evidence for acute intracranial abnormality. 2. Extensive white matter changes in the left greater than right hemisphere, grossly similar distribution compared to most recent MRI. Low-attenuation foci in the left caudate and temporal lobe presumably corresponding to the previously noted MRI masses. No significant mass effect or midline shift. 3. Atrophy Electronically Signed   By: Donavan Foil M.D.   On: 07/16/2017 19:10    Procedures Procedures (including critical care time)  Medications Ordered in ED Medications  amphetamine-dextroamphetamine (ADDERALL) tablet 30 mg (has no administration in time range)  ketorolac (ACULAR)  0.5 % ophthalmic solution 1 drop (1 drop Right Eye Given 07/16/17 2333)  cholecalciferol (VITAMIN D) tablet 1,000 Units (has no administration in time range)  dexamethasone (DECADRON) tablet 2 mg (2 mg Oral Given 07/17/17 0017)  Difluprednate 0.05 % EMUL 1 drop (has no administration in time range)  lacosamide (VIMPAT) tablet 50 mg (50 mg Oral Given 07/17/17 0018)  ondansetron (ZOFRAN) tablet 8 mg (has no administration in time range)  venlafaxine XR (EFFEXOR-XR) 24 hr capsule 75 mg (has no administration in time range)  enoxaparin (LOVENOX) injection 40 mg (has no administration in time range)  0.9 %  sodium chloride infusion ( Intravenous New Bag/Given 07/16/17 2251)  acetaminophen (TYLENOL) tablet 650 mg (has no administration in time range)    Or  acetaminophen (TYLENOL) suppository 650 mg (has no administration in time range)  senna-docusate (Senokot-S) tablet 1 tablet (has no administration in time range)  bisacodyl (DULCOLAX) EC tablet 5 mg (has no administration in time range)  magnesium citrate solution 1 Bottle (has no administration in time range)  albuterol (PROVENTIL) (2.5 MG/3ML) 0.083% nebulizer solution 2.5 mg (has no administration in time range)  ipratropium (ATROVENT) nebulizer solution 0.5 mg (has no administration in time  range)  ciprofloxacin (CIPRO) IVPB 400 mg (400 mg Intravenous New Bag/Given 07/16/17 2331)  sodium chloride 0.9 % bolus 1,000 mL (0 mLs Intravenous Stopped 07/16/17 1913)  sodium chloride 0.9 % bolus 1,000 mL (1,000 mLs Intravenous New Bag/Given 07/16/17 2041)     Initial Impression / Assessment and Plan / ED Course  I have reviewed the triage vital signs and the nursing notes.  Pertinent labs & imaging results that were available during my care of the patient were reviewed by me and considered in my medical decision making (see chart for details).     72 year old female here with altered mental status and generalized weakness.  She is markedly dehydrated clinically.  Lab work is overall reassuring with normal white count.  BMP is at baseline.  She does have significant ketonuria and has only produced approximately 20 cc of urine after 2 L of fluid.  I suspect she has general encephalopathy secondary to dehydration in the setting of her brain tumor and radiation changes.  Given that she cannot ambulate and her weakness, will admit for fluids and monitoring.  Final Clinical Impressions(s) / ED Diagnoses   Final diagnoses:  Dehydration    ED Discharge Orders    None       Duffy Bruce, MD 07/17/17 [phone]

## 2017-07-16 NOTE — Telephone Encounter (Signed)
Received call from [employer] with [place] [phone].  She arrived in the patients home and the patient is very non responsive, unable to register a temperature greater than 92.8 and unable to auscultate a blood pressure even manually on the patient.  Patient has had periods of incontinence of bowel and bladder recently and is not eating or drinking.  Family reports she was able to get medications in.   Therapist and family proceeded with calling 911 for transfer to [facility].  Dr. Mickeal Skinner (neuro-oncologist) aware.

## 2017-07-17 DIAGNOSIS — K219 Gastro-esophageal reflux disease without esophagitis: Secondary | ICD-10-CM

## 2017-07-17 DIAGNOSIS — G9341 Metabolic encephalopathy: Secondary | ICD-10-CM

## 2017-07-17 DIAGNOSIS — F419 Anxiety disorder, unspecified: Secondary | ICD-10-CM

## 2017-07-17 DIAGNOSIS — C719 Malignant neoplasm of brain, unspecified: Secondary | ICD-10-CM

## 2017-07-17 DIAGNOSIS — F329 Major depressive disorder, single episode, unspecified: Secondary | ICD-10-CM

## 2017-07-17 LAB — CBC
HEMATOCRIT: 38.9 % (ref 36.0–46.0)
HEMOGLOBIN: 13.4 g/dL (ref 12.0–15.0)
MCH: 32.1 pg (ref 26.0–34.0)
MCHC: 34.4 g/dL (ref 30.0–36.0)
MCV: 93.1 fL (ref 78.0–100.0)
Platelets: 74 10*3/uL — ABNORMAL LOW (ref 150–400)
RBC: 4.18 MIL/uL (ref 3.87–5.11)
RDW: 17 % — AB (ref 11.5–15.5)
WBC: 8.1 10*3/uL (ref 4.0–10.5)

## 2017-07-17 LAB — BASIC METABOLIC PANEL
ANION GAP: 13 (ref 5–15)
BUN: 13 mg/dL (ref 6–20)
CALCIUM: 8.1 mg/dL — AB (ref 8.9–10.3)
CO2: 18 mmol/L — AB (ref 22–32)
Chloride: 108 mmol/L (ref 101–111)
Creatinine, Ser: 0.38 mg/dL — ABNORMAL LOW (ref 0.44–1.00)
GFR calc Af Amer: >60 mL/min (ref >60)
GLUCOSE: 69 mg/dL (ref 65–99)
Potassium: 4 mmol/L (ref 3.5–5.1)
Sodium: 139 mmol/L (ref 135–145)

## 2017-07-17 LAB — LACTIC ACID, PLASMA: LACTIC ACID, VENOUS: 1 mmol/L (ref 0.5–1.9)

## 2017-07-17 MED ORDER — PANTOPRAZOLE SODIUM 40 MG PO TBEC
40.0000 mg | DELAYED_RELEASE_TABLET | Freq: Every day | ORAL | Status: DC
  Administered 2017-07-17 – 2017-07-19 (×3): 40 mg via ORAL
  Filled 2017-07-17 (×3): qty 1

## 2017-07-17 MED ORDER — IPRATROPIUM-ALBUTEROL 0.5-2.5 (3) MG/3ML IN SOLN
3.0000 mL | Freq: Four times a day (QID) | RESPIRATORY_TRACT | Status: DC | PRN
Start: 2017-07-17 — End: 2017-07-19

## 2017-07-17 MED ORDER — SODIUM CHLORIDE 0.9 % IV SOLN
1.0000 g | INTRAVENOUS | Status: DC
  Administered 2017-07-17 – 2017-07-19 (×3): 1 g via INTRAVENOUS
  Filled 2017-07-17 (×3): qty 1

## 2017-07-17 MED ORDER — ENSURE ENLIVE PO LIQD
237.0000 mL | Freq: Two times a day (BID) | ORAL | Status: DC
  Administered 2017-07-17 – 2017-07-18 (×3): 237 mL via ORAL

## 2017-07-17 NOTE — Evaluation (Signed)
Physical Therapy Evaluation Patient Details Name: Kelly Maxwell MRN: [id number] DOB: 12-20-1945 Today's Date: 07/17/2017   History of Present Illness  72 y.o. female with a known history of glioblastoma diagnosed in 2013 status post radiation and chemotherapy, depression, hyperlipidemia presents to the emergency department for evaluation of AMS, weakness  Clinical Impression  Pt admitted with above diagnosis. Pt currently with functional limitations due to the deficits listed below (see PT Problem List). Pt quite resistant to participating in PT session, but with max encouragement from PT and pt's daughter, pt agreed to ambulate to bathroom. Min assist for balance required, pt refused use of RW. Daughter reports baseline dementia 2* brain tumor, and notes recent decline with activity tolerance. Pt attempted to clean herself after a BM but had no toilet paper in her hand, daughter reports this decline in cognition is new.  Daughter would like to pursue ST-SNF. Pt will benefit from skilled PT to increase their independence and safety with mobility to allow discharge to the venue listed below.       Follow Up Recommendations SNF;Supervision/Assistance - 24 hour;Supervision for mobility/OOB    Equipment Recommendations  None recommended by PT    Recommendations for Other Services       Precautions / Restrictions Precautions Precautions: Fall Precaution Comments: fell in March with attempting sit to stand and LEs buckled, no other falls in past year Restrictions Weight Bearing Restrictions: No      Mobility  Bed Mobility Overal bed mobility: Needs Assistance Bed Mobility: Supine to Sit     Supine to sit: Mod assist     General bed mobility comments: mod A to raise trunk  Transfers Overall transfer level: Needs assistance Equipment used: 1 person hand held assist Transfers: Sit to/from Stand Sit to Stand: Min assist         General transfer comment: min A to rise from  elevated bed and from commode using grab bar, encouragement needed for participation  Ambulation/Gait Ambulation/Gait assistance: Min assist Ambulation Distance (Feet): 24 Feet Assistive device: 1 person hand held assist Gait Pattern/deviations: Step-through pattern;Trunk flexed     General Gait Details: max encouragement to participate, pt agreed to walk to bathroom (12'x2), refused use of RW so did HHA for balance, pt reached for bedrail and bathroom door for UE support, pt needed max cueing to release toilet paper after wiping BM and assist for pericare  Stairs            Wheelchair Mobility    Modified Rankin (Stroke Patients Only)       Balance Overall balance assessment: Needs assistance   Sitting balance-Leahy Scale: Good       Standing balance-Leahy Scale: Fair Standing balance comment: needs single UE support for dynamic standing                             Pertinent Vitals/Pain Pain Assessment: No/denies pain    Home Living Family/patient expects to be discharged to:: Private residence Living Arrangements: Spouse/significant other;Children Available Help at Discharge: Family;Available 24 hours/day Type of Home: House Home Access: Stairs to enter Entrance Stairs-Rails: Right Entrance Stairs-Number of Steps: 5 Home Layout: Two level;Able to live on main level with bedroom/bathroom Home Equipment: Gilford Rile - 2 wheels;Bedside commode;Shower seat      Prior Function Level of Independence: Needs assistance   Gait / Transfers Assistance Needed: walks without AD; daughter notes pt has had more difficulty with sit to stand  in past month, in general pt resists physical activity per daughter  ADL's / Homemaking Assistance Needed: assist for bathing and dressing just in past month  Comments: has a walker but doesn't use it     Hand Dominance        Extremity/Trunk Assessment   Upper Extremity Assessment Upper Extremity Assessment: Overall WFL  for tasks assessed    Lower Extremity Assessment Lower Extremity Assessment: Generalized weakness;Difficult to assess due to impaired cognition(pt did not follow commands for manual muscle testing)    Cervical / Trunk Assessment Cervical / Trunk Assessment: Normal  Communication   Communication: No difficulties  Cognition Arousal/Alertness: Awake/alert Behavior During Therapy: WFL for tasks assessed/performed Overall Cognitive Status: History of cognitive impairments - at baseline                                 General Comments: daughter reports pt has dementia from brain tumor/chemo      General Comments      Exercises     Assessment/Plan    PT Assessment Patient needs continued PT services  PT Problem List Decreased activity tolerance;Decreased balance;Decreased mobility;Decreased safety awareness;Decreased knowledge of use of DME       PT Treatment Interventions Gait training;Functional mobility training;Therapeutic activities;Therapeutic exercise;Balance training;Patient/family education    PT Goals (Current goals can be found in the Care Plan section)  Acute Rehab PT Goals Patient Stated Goal: daughter would like mother to be more active and mobile PT Goal Formulation: With family Time For Goal Achievement: 07/31/17 Potential to Achieve Goals: Fair    Frequency Min 3X/week   Barriers to discharge        Co-evaluation               AM-PAC PT "6 Clicks" Daily Activity  Outcome Measure Difficulty turning over in bed (including adjusting bedclothes, sheets and blankets)?: A Lot Difficulty moving from lying on back to sitting on the side of the bed? : Unable Difficulty sitting down on and standing up from a chair with arms (e.g., wheelchair, bedside commode, etc,.)?: Unable Help needed moving to and from a bed to chair (including a wheelchair)?: A Little Help needed walking in hospital room?: A Little Help needed climbing 3-5 steps with a  railing? : A Lot 6 Click Score: 12    End of Session Equipment Utilized During Treatment: Gait belt Activity Tolerance: Patient limited by fatigue;Patient limited by lethargy Patient left: in chair;with call bell/phone within reach;with family/visitor present Nurse Communication: Mobility status PT Visit Diagnosis: Unsteadiness on feet (R26.81);History of falling (Z91.81);Difficulty in walking, not elsewhere classified (R26.2)    Time: 6314-9702 PT Time Calculation (min) (ACUTE ONLY): 38 min   Charges:   PT Evaluation $PT Eval Low Complexity: 1 Low PT Treatments $Gait Training: 8-22 mins $Therapeutic Activity: 8-22 mins   PT G Codes:          Philomena Doheny 07/17/2017, 12:20 PM [phone]

## 2017-07-17 NOTE — Progress Notes (Addendum)
Initial Nutrition Assessment  DOCUMENTATION CODES:   Not applicable  INTERVENTION:  Ensure Enlive po BID, each supplement provides 350 kcal and 20 grams of protein  NUTRITION DIAGNOSIS:   Inadequate oral intake related to lethargy/confusion, decreased appetite as evidenced by per patient/family report, meal completion < 25%(meal completion < 25% per RN).  GOAL:   Patient will meet greater than or equal to 90% of their needs  MONITOR:   PO intake, Supplement acceptance, Weight trends, Skin, Labs, I & O's  REASON FOR ASSESSMENT:   Malnutrition Screening Tool    ASSESSMENT:   71 y.o. Female admitted on 07/16/17 for dehydration. PMH of glioblastoma diagnosed 2013 s/p ChRT in 2014 when she was last seen by RD. PMH of depression and HLD. Per MD note, family reports pt has increased confusion, lethargy, nausea, and decreased PO intake due to Aricept initiation 4 days PTA.  Per PT note pt with cognitive impairments at baseline; daughter reports pt has dementia from brain tumor/chemo. Per PT note pt also has some mobility limitations and difficulty with ADLs; pt typically uses a wheelchair.   Spoke with RN regarding pt intake; RN reports that pt only ate applesauce this morning as that is all she wanted to eat. RN reports that pt would benefit from a supplement like ensure because her appetite is poor.   Pt in chair with various family members at bedside. Daughter reports mother will eat "two good meals a day"; in the morning she will eat two eggs and a protein/fat shake, for dinner she will eat whatever they are having that day and will typically eat "a good amount" of it. Daughter also reports that the pt will eat a lot of snacks throughout the day; dietetic intern offered to add snacks for her in-between meals, but pt decline - daughter reports this is weird behavior for her and that yesterday she didn't eat anything. Pt and family open to supplementation to help her get in some more  calories and protein while she is not eating.  Both pt and family deny weight loss and that pts weight has been stable.   Medications reviewed: adderall, vitamin D, decadron, effexor, rocephin, NaCl 110ml/hr.   Labs reviewed: CO2 18 (L), creatinine 0.38 (L), platelets 74 (L).   Intake/Output Summary (Last 24 hours) at 07/17/2017 1536 Last data filed at 07/17/2017 0459 Gross per 24 hour  Intake 1631.25 ml  Output 400 ml  Net 1231.25 ml   Net I/O since admit: + 1.2 L  NUTRITION - FOCUSED PHYSICAL EXAM:    Most Recent Value  Orbital Region  No depletion  Upper Arm Region  Mild depletion  Thoracic and Lumbar Region  No depletion  Buccal Region  No depletion  Temple Region  No depletion  Clavicle Bone Region  No depletion  Clavicle and Acromion Bone Region  Mild depletion  Scapular Bone Region  Unable to assess  Dorsal Hand  No depletion  Patellar Region  No depletion  Anterior Thigh Region  Mild depletion  Posterior Calf Region  Mild depletion  Edema (RD Assessment)  None  Hair  Reviewed  Eyes  Reviewed  Mouth  Reviewed  Skin  Reviewed  Nails  Reviewed       Diet Order:   Diet Order           Diet regular Room service appropriate? Yes; Fluid consistency: Thin  Diet effective now          EDUCATION NEEDS:   Not appropriate  for education at this time  Skin:  Skin Assessment: Reviewed RN Assessment  Last BM:  07/15/17  Height:   Ht Readings from Last 1 Encounters:  07/16/17 5\' 5"  (1.651 m)    Weight:   Wt Readings from Last 1 Encounters:  07/16/17 152 lb 1.9 oz (69 kg)   UBW: 147-153 lbs % UBW: 100%  Ideal Body Weight:  56.81 kg  BMI:  Body mass index is 25.31 kg/m.  Estimated Nutritional Needs:   Kcal:  1500-1700 kcal  Protein:  75-90 grams  Fluid:  1.5 - 1.7 L   Hope Budds, Dietetic Intern

## 2017-07-17 NOTE — Progress Notes (Signed)
PROGRESS NOTE    Kelly Maxwell  OYD:[id number] DOB: [birth year] DOA: 07/16/2017 PCP: Wenda Low, MD    Brief Narrative:  72 year old female history of glioblastoma diagnosed in 2013 status post radiation and chemotherapy, currently receiving avastin history of depression, hyperlipidemia presented to ED for generalized weakness and altered mental status.  Patient noted to be in a usual state of health until about 4 days prior to admission when she was started on Aricept and the family believes caused her to have increased confusion, lethargy, nausea decreased oral intake.  Patient noted to have intermittent episodes of confusion but worsening change.  Patient with poor oral intake.  Patient also noted to have been seen in the ED in [place] on Palm Sunday diagnosed with UTI and currently on ciprofloxacin which has been prescribed through May 17. Patient admitted placed empirically on IV Rocephin, Aricept discontinued, patient placed on IV fluids.   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  Patient with some clinical improvement however per family not at baseline.  Cultures pending however may be negative as patient has been partially treated and on oral antibiotics prior to presentation.  Will discontinue IV ciprofloxacin and place empirically on IV Rocephin pending urine cultures and through May 17.  Continue IV fluids.  Supportive care.  2.  dehydration IV fluids.  3.  Depression/anxiety Stable.  Continue Effexor.  4.  Gastroesophageal reflux disease PPI.  5.  Glioarcoma- left temporal lobe status post  treatment 2013 Patient currently on Avastin and being followed by neuro oncology.  No seizures noted.  Patient noted to have some recent cognitive decline and was started on Aricept.  Due to concerns that Aricept may be causing the symptoms this has been discontinued.  CT head negative for any acute abnormalities.  Continue Vimpat and Decadron.  Will need to follow-up with neuro-oncology in the outpatient setting.  Dr. Mickeal Skinner, neuroncologist has been notified via epic of patient's admission.    DVT prophylaxis: Lovenox Code Status: Full Family Communication: Updated daughter and husband at bedside. Disposition Plan: Skilled nursing facility versus home with home health pending improvement.   Consultants:   None  Procedures:   CT Head 07/16/2017  Chest x-ray 07/16/2017  Antimicrobials:   IV Rocephin 07/17/2017  IV ciprofloxacin 07/16/2017>>>> 07/17/2017   Subjective: Sitting up in chair.  Oriented to self only.  Denies any shortness of breath or chest pain.  Family at bedside feeding patient slowly improving however not at baseline.  Objective: Vitals:   07/16/17 1830 07/16/17 2138 07/16/17 2243 07/17/17 0452  BP: (!) 152/91 (!) 158/68 (!) 149/93 (!) 152/93  Pulse: 87 (!) 58 66 81  Resp: (!) 27 (!) 21 20   Temp:   97.7 F (36.5 C) 97.8 F (36.6 C)  TempSrc:   Oral Oral  SpO2: 97% 99% 99% 95%  Weight:   69 kg (152 lb 1.9 oz)   Height:   5\' 5"  (1.651 m)     Intake/Output Summary (Last 24 hours) at 07/17/2017 1531 Last data filed at 07/17/2017 0459 Gross per 24 hour  Intake 1631.25 ml  Output 400 ml  Net 1231.25 ml   [name]  Weights   07/16/17 2243  Weight: 69 kg (152 lb 1.9 oz)    Examination:  General exam: Appears calm and comfortable  Respiratory system: Clear to auscultation. Respiratory effort normal. Cardiovascular system: S1 & S2 heard, RRR. No JVD, murmurs, rubs, gallops or clicks. No pedal edema. Gastrointestinal system: Abdomen is nondistended, soft and  nontender. No organomegaly or masses felt. Normal bowel sounds heard. Central nervous system: Alert. No focal neurological deficits. Extremities: Symmetric 5 x 5 power. Skin: No rashes, lesions or ulcers Psychiatry: Judgement and insight appear normal. Mood & affect appropriate.     Data Reviewed: I have personally reviewed following labs and imaging studies  CBC: Recent Labs  Lab 07/16/17 1745 07/17/17 0341  WBC 9.8 8.1  NEUTROABS 8.7*  --   HGB 14.5 13.4  HCT 42.9 38.9  MCV 94.3 93.1  PLT 86* 74*   Basic Metabolic Panel: Recent Labs  Lab 07/16/17 1745 07/17/17 0341  NA 140 139  K 4.0 4.0  CL 107 108  CO2 22 18*  GLUCOSE 88 69  BUN 17 13  CREATININE 0.43* 0.38*  CALCIUM 8.9 8.1*   GFR: Estimated Creatinine Clearance: 61.1 mL/min (A) (by C-G formula based on SCr of 0.38 mg/dL (L)). Liver Function Tests: Recent Labs  Lab 07/16/17 1745  AST 23  ALT 33  ALKPHOS 63  BILITOT 1.4*  PROT 6.3*  ALBUMIN 3.6   No results for input(s): LIPASE, AMYLASE in the last 168 hours. No results for input(s): AMMONIA in the last 168 hours. Coagulation Profile: No results for input(s): INR, PROTIME in the last 168 hours. Cardiac Enzymes: No results for input(s): CKTOTAL, CKMB, CKMBINDEX, TROPONINI in the last 168 hours. BNP (last 3 results) No results for input(s): PROBNP in the last 8760 hours. HbA1C: No results for input(s): HGBA1C in the last 72 hours. CBG: Recent Labs  Lab 07/16/17 1727  GLUCAP 77   Lipid Profile: No results for input(s): CHOL, HDL, LDLCALC, TRIG, CHOLHDL, LDLDIRECT in the last 72 hours. Thyroid Function Tests: No results for input(s): TSH, T4TOTAL, FREET4, T3FREE, THYROIDAB in the last 72 hours. Anemia Panel: No results for input(s): VITAMINB12, FOLATE, FERRITIN, TIBC, IRON, RETICCTPCT in the last 72 hours. Sepsis Labs: Recent Labs  Lab 07/16/17 1809 07/16/17 2044 07/16/17 2047 07/17/17 0913  LATICACIDVEN 1.32 2.68* 2.80* 1.0    No  results found for this or any previous visit (from the past 240 hour(s)).       Radiology Studies: Dg Chest 2 View  Result Date: 07/16/2017 CLINICAL DATA:  Patient arrived via [facility] from home. Patient has a brain tumor (glial neoplasm) and being treated for chemo. Increased lethargy today. Altered mental status. EXAM: CHEST - 2 VIEW COMPARISON:  Chest x-ray dated 09/03/2015. FINDINGS: Heart size and mediastinal contours are stable. Probable mild bibasilar atelectasis, stable. Lungs otherwise clear. No pleural effusion or pneumothorax seen. Stable elevation of the RIGHT hemidiaphragm. Osseous structures about the chest are unremarkable. IMPRESSION: No active cardiopulmonary disease. No evidence of pneumonia or pulmonary edema. Electronically Signed   By: Franki Cabot M.D.   On: 07/16/2017 18:43   Ct Head Wo Contrast  Result Date: 07/16/2017 CLINICAL DATA:  History of brain tumor increased lethargy EXAM: CT HEAD WITHOUT CONTRAST TECHNIQUE: Contiguous axial images were obtained from the base of the skull through the vertex without intravenous contrast. COMPARISON:  06/19/2017, 04/17/2017, 03/11/2017 MRI, 01/09/2017 MRI, head CT 12/15/2011 FINDINGS: Brain: No hemorrhage or large vessel infarct is seen. Extensive hypodensity in the left  greater than white white matter, grossly similar distribution compared with most recent MRI from April 2019. Low-density foci in the left caudate and temporal lobe, corresponding to MRI demonstrated masses. Stable ventricle size. Vascular: No hyperdense vessels.  Carotid vascular calcification Skull: No fracture.  Left tempoparietal craniotomy. Sinuses/Orbits: No acute finding. Other: None IMPRESSION: 1. No definite CT evidence for acute intracranial abnormality. 2. Extensive white matter changes in the left greater than right hemisphere, grossly similar distribution compared to most recent MRI. Low-attenuation foci in the left caudate and temporal lobe presumably  corresponding to the previously noted MRI masses. No significant mass effect or midline shift. 3. Atrophy Electronically Signed   By: Donavan Foil M.D.   On: 07/16/2017 19:10        Scheduled Meds: . amphetamine-dextroamphetamine  30 mg Oral Daily  . cholecalciferol  1,000 Units Oral Daily  . dexamethasone  2 mg Oral BID  . Difluprednate  1 drop Ophthalmic QID  . enoxaparin (LOVENOX) injection  40 mg Subcutaneous Q24H  . ketorolac  1 drop Right Eye QID  . lacosamide  50 mg Oral BID  . venlafaxine XR  75 mg Oral Daily   Continuous Infusions: . sodium chloride 100 mL/hr at 07/17/17 0846  . cefTRIAXone (ROCEPHIN)  IV Stopped (07/17/17 2751)     LOS: 1 day    Time spent: 35 minutes    Irine Seal, MD [facility] Pager [phone]  If 7PM-7AM, please contact night-coverage [email or web address] Password [facility] 07/17/2017, 3:31 PM

## 2017-07-18 ENCOUNTER — Telehealth: Payer: Self-pay | Admitting: *Deleted

## 2017-07-18 LAB — CBC WITH DIFFERENTIAL/PLATELET
BASOS PCT: 0 %
Basophils Absolute: 0 10*3/uL (ref 0.0–0.1)
Eosinophils Absolute: 0 10*3/uL (ref 0.0–0.7)
Eosinophils Relative: 1 %
HEMATOCRIT: 35.4 % — AB (ref 36.0–46.0)
Hemoglobin: 11.8 g/dL — ABNORMAL LOW (ref 12.0–15.0)
Lymphocytes Relative: 9 %
Lymphs Abs: 0.7 10*3/uL (ref 0.7–4.0)
MCH: 31.5 pg (ref 26.0–34.0)
MCHC: 33.3 g/dL (ref 30.0–36.0)
MCV: 94.4 fL (ref 78.0–100.0)
MONO ABS: 0.2 10*3/uL (ref 0.1–1.0)
MONOS PCT: 3 %
NEUTROS ABS: 6.5 10*3/uL (ref 1.7–7.7)
Neutrophils Relative %: 87 %
Platelets: 71 10*3/uL — ABNORMAL LOW (ref 150–400)
RBC: 3.75 MIL/uL — ABNORMAL LOW (ref 3.87–5.11)
RDW: 17.1 % — AB (ref 11.5–15.5)
WBC: 7.5 10*3/uL (ref 4.0–10.5)

## 2017-07-18 LAB — BASIC METABOLIC PANEL
Anion gap: 6 (ref 5–15)
BUN: 8 mg/dL (ref 6–20)
CO2: 18 mmol/L — AB (ref 22–32)
Calcium: 7.9 mg/dL — ABNORMAL LOW (ref 8.9–10.3)
Chloride: 115 mmol/L — ABNORMAL HIGH (ref 101–111)
Creatinine, Ser: <0.3 mg/dL — ABNORMAL LOW (ref 0.44–1.00)
GLUCOSE: 104 mg/dL — AB (ref 65–99)
Potassium: 3.8 mmol/L (ref 3.5–5.1)
Sodium: 139 mmol/L (ref 135–145)

## 2017-07-18 LAB — URINE CULTURE

## 2017-07-18 LAB — MAGNESIUM: Magnesium: 1.8 mg/dL (ref 1.7–2.4)

## 2017-07-18 MED ORDER — HYDROGEN PEROXIDE 3 % EX SOLN
CUTANEOUS | Status: AC
  Filled 2017-07-18: qty 473

## 2017-07-18 MED ORDER — OXYMETAZOLINE HCL 0.05 % NA SOLN
1.0000 | Freq: Two times a day (BID) | NASAL | Status: DC | PRN
  Filled 2017-07-18: qty 15

## 2017-07-18 MED ORDER — MAGNESIUM SULFATE 2 GM/50ML IV SOLN
2.0000 g | Freq: Once | INTRAVENOUS | Status: AC
  Administered 2017-07-18: 2 g via INTRAVENOUS
  Filled 2017-07-18: qty 50

## 2017-07-18 MED ORDER — SODIUM BICARBONATE 650 MG PO TABS
650.0000 mg | ORAL_TABLET | Freq: Two times a day (BID) | ORAL | Status: DC
  Administered 2017-07-18 – 2017-07-19 (×3): 650 mg via ORAL
  Filled 2017-07-18 (×3): qty 1

## 2017-07-18 MED ORDER — AMLODIPINE BESYLATE 5 MG PO TABS
5.0000 mg | ORAL_TABLET | Freq: Every day | ORAL | Status: DC
  Administered 2017-07-18 – 2017-07-19 (×2): 5 mg via ORAL
  Filled 2017-07-18 (×2): qty 1

## 2017-07-18 NOTE — Clinical Social Work Note (Signed)
Clinical Social Work Assessment  Patient Details  Name: Kelly Maxwell MRN: [id number] Date of Birth: 03/31/1945  Date of referral:  07/18/17               Reason for consult:  Facility Placement                Permission sought to share information with:  Family Supports Permission granted to share information::  Yes, Verbal Permission Granted  Name::     husband Darla Lesches::     Relationship::     Contact Information:     Housing/Transportation Living arrangements for the past 2 months:  Single Family Home Source of Information:  Patient, Spouse, Medical Team Patient Interpreter Needed:  None Criminal Activity/Legal Involvement Pertinent to Current Situation/Hospitalization:  No - Comment as needed Significant Relationships:  Spouse, Adult Children Lives with:  Self, Spouse, Adult Children Do you feel safe going back to the place where you live?  Yes Need for family participation in patient care:  Yes (Comment)(pt slightly confused)  Care giving concerns:  Pt admitted from home where she resides with her husband and son. Reports prior to admission she was independent at home with ambulation although she "has been getting weaker," required some assistance and prompting with ADLs due to confusion. Is patient of [facility] (has glioblastoma) and per husband has been getting chemotherapy treatments as outpatient every 2 weeks. Currently on hold "for her to get stronger to tolerate more," but husband hoping to resume once pt convalesces from hospital stay. Husband reports that pt's mental status worsened a few days prior to admission "after she started taking Aricept." Currently admitted for encephalopathy, UTI, dehydration.    Social Worker assessment / plan:  CSW consulted to assist with disposition plan as SNF recommended. Met with pt at bedside and husband participated via phone. Pt was pleasant and engaged, answered questions relevantly but was unable to discuss care in detail. Showed  pauses in between answering questions and apologized "for not being able to remember." Defers to her husband for decision making. Husband reports he understands nature of short term rehab at SNF as he has had family members do this in the past. Preference for [facility] as one of pt's family members is a long term care resident there.  Husband reports that while pt in rehab, he can transport her to any [facility] appointments. Also states he can bring Vimpat from home if needed (all facilities cannot provide).  CSW explained that pt's [facility] requires prior authorization for SNF which can take several days. Husband understanding.  Completed referrals and will follow up with bed offers - husband hoping [facility] available. Submitted for PASRR- went to manual review. Will provide additional information when requested. Pt will not be able to admit to a SNF until PASRR approved. Plan: Pursuing SNF for [facility] [place]. Barriers: facility choice, [employer] Medicare authorization, PASRR  Employment status:  Retired Medical sales representative) PT Recommendations:  [place], [place] / Referral to community resources:  [place]  Patient/Family's Response to care:  Husband reports he is very Patent attorney.   Patient/Family's Understanding of and Emotional Response to Diagnosis, Current Treatment, and Prognosis:  Pt does not demonstrate understanding of treatment as evidenced by saying yes and no when questioned about it but unable to do teachback. Husband however shows good understanding and is emotionally positive about plan. "I hope she can start back chemotherapy soon and I  think after a few days in rehab that will be good for her to come back home."  Emotional Assessment Appearance:  Appears stated age Attitude/Demeanor/Rapport:  (engaged but struggles to recall details) Affect (typically observed):  Accepting, Calm Orientation:   Oriented to Self, Oriented to Place, Oriented to  Time Alcohol / Substance use:  Not Applicable Psych involvement (Current and /or in the community):  No (Comment)  Discharge Needs  Concerns to be addressed:  Discharge Planning Concerns, Care Coordination Readmission within the last 30 days:  No Current discharge risk:  Dependent with Mobility Barriers to Discharge:  Continued Medical Work up, Ship broker, Programmer, applications ([place])   Nila Nephew, LCSW 07/18/2017, 2:19 PM  [phone]

## 2017-07-18 NOTE — Progress Notes (Signed)
PROGRESS NOTE    Kelly Maxwell  KYH:[id number] DOB: 09-Jul-1945 DOA: 07/16/2017 PCP: Wenda Low, MD    Brief Narrative:  72 year old female history of glioblastoma diagnosed in 2013 status post radiation and chemotherapy, currently receiving avastin history of depression, hyperlipidemia presented to ED for generalized weakness and altered mental status.  Patient noted to be in a usual state of health until about 4 days prior to admission when she was started on Aricept and the family believes caused her to have increased confusion, lethargy, nausea decreased oral intake.  Patient noted to have intermittent episodes of confusion but worsening change.  Patient with poor oral intake.  Patient also noted to have been seen in the ED in [place] on Palm Sunday diagnosed with UTI and currently on ciprofloxacin which has been prescribed through May 17. Patient admitted placed empirically on IV Rocephin, Aricept discontinued, patient placed on IV fluids.   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  Patient improving daily however not at baseline yet.  Urine cultures with less than 10,000 colonies however patient was on antibiotics prior to admission and as such we will continue antibiotics to complete course of treatment.  IV ciprofloxacin has been discontinued.  Continue IV Rocephin through May 17.  Supportive care.   2.  dehydration Saline lock IV fluids.  3.  Depression/anxiety Clinically stable.  Continue Effexor.   4.  Gastroesophageal reflux disease Continue PPI.  5.  Glioarcoma- left temporal lobe status post  treatment 2013 Patient currently on Avastin and being followed by neuro oncology.  No seizures noted.  Patient noted to have some recent cognitive decline and was started on Aricept.  Due to concerns that Aricept may be causing the symptoms this has been discontinued.  CT head negative for any acute abnormalities.  Continue Vimpat and Decadron.  Outpatient follow-up with neuro oncology. Dr. Mickeal Skinner, neuroncologist has been notified via epic of patient's admission.    DVT prophylaxis: Lovenox Code Status: Full Family Communication: No family at bedside.  Disposition Plan: Skilled nursing facility when medically stable and close to baseline hopefully the next 24 to 48 hours.    Consultants:   None  Procedures:   CT Head 07/16/2017  Chest x-ray 07/16/2017  Antimicrobials:   IV Rocephin 07/17/2017  IV ciprofloxacin 07/16/2017>>>> 07/17/2017   Subjective: Sleeping however easily arousable.  Denies any chest pain or shortness of breath.  No family at bedside.    Objective: Vitals:   07/17/17 2039 07/18/17 0714 07/18/17 0731 07/18/17 1117  BP: (!) 166/96 (!) 196/114 (!) 160/90 (!) 147/107  Pulse: 67 (!) 52 86 66  Resp: 12 18  18   Temp: 98 F (36.7 C) 97.6 F (36.4 C)  97.7 F (36.5 C)  TempSrc: Oral Oral  Oral  SpO2: 98% 94%  99%  Weight:      Height:        Intake/Output Summary (Last 24 hours) at 07/18/2017 1249 Last data filed at 07/18/2017 0700 Gross per 24 hour  Intake 3275.83 ml  Output 1300 ml  Net 1975.83 ml   Filed Weights   07/16/17 2243  Weight: 69 kg (152 lb 1.9 oz)  Examination:  General exam: Sleeping. Respiratory system: Lungs clear to auscultation on clear lung fields.  No wheezes, no crackles, no rhonchi.  Respiratory effort normal. Cardiovascular system: Regular rate and rhythm no murmurs rubs or gallops.  No JVD.  No lower extremity edema.  Gastrointestinal system: Abdomen is soft, nontender, nondistended, positive bowel sounds.  No rebound.  No  guarding.  Central nervous system: Sleeping however easily arousable.  Moving extremities spontaneously.   Extremities: Symmetric 5 x 5 power. Skin: No rashes, lesions or ulcers Psychiatry: Judgement and insight appear normal. Mood & affect appropriate.     Data Reviewed: I have personally reviewed following labs and imaging studies  CBC: Recent Labs  Lab 07/16/17 1745 07/17/17 0341 07/18/17 0358  WBC 9.8 8.1 7.5  NEUTROABS 8.7*  --  6.5  HGB 14.5 13.4 11.8*  HCT 42.9 38.9 35.4*  MCV 94.3 93.1 94.4  PLT 86* 74* 71*   Basic Metabolic Panel: Recent Labs  Lab 07/16/17 1745 07/17/17 0341 07/18/17 0358  NA 140 139 139  K 4.0 4.0 3.8  CL 107 108 115*  CO2 22 18* 18*  GLUCOSE 88 69 104*  BUN 17 13 8   CREATININE 0.43* 0.38* <0.30*  CALCIUM 8.9 8.1* 7.9*  MG  --   --  1.8   GFR: CrCl cannot be calculated (This lab value cannot be used to calculate CrCl because it is not a number: <0.30). Liver Function Tests: Recent Labs  Lab 07/16/17 1745  AST 23  ALT 33  ALKPHOS 63  BILITOT 1.4*  PROT 6.3*  ALBUMIN 3.6   No results for input(s): LIPASE, AMYLASE in the last 168 hours. No results for input(s): AMMONIA in the last 168 hours. Coagulation Profile: No results for input(s): INR, PROTIME in the last 168 hours. Cardiac Enzymes: No results for input(s): CKTOTAL, CKMB, CKMBINDEX, TROPONINI in the last 168 hours. BNP (last 3 results) No results for input(s): PROBNP in the last 8760 hours. HbA1C: No results for input(s): HGBA1C in the last 72 hours. CBG: Recent Labs  Lab 07/16/17 1727  GLUCAP 77   Lipid Profile: No results for input(s): CHOL, HDL, LDLCALC, TRIG, CHOLHDL, LDLDIRECT in the last 72 hours. Thyroid Function Tests: No results for input(s): TSH, T4TOTAL, FREET4, T3FREE, THYROIDAB in the last 72 hours. Anemia Panel: No results for input(s): VITAMINB12, FOLATE, FERRITIN, TIBC, IRON, RETICCTPCT in the last 72 hours. Sepsis Labs: Recent Labs  Lab  07/16/17 1809 07/16/17 2044 07/16/17 2047 07/17/17 0913  LATICACIDVEN 1.32 2.68* 2.80* 1.0    Recent Results (from the past 240 hour(s))  Culture, Urine     Status: Abnormal   Collection Time: 07/16/17  6:35 PM  Result Value Ref Range Status   Specimen Description   Final    URINE, CLEAN CATCH Performed at [facility] [address] [place], [place] [address]    Special Requests   Final    NONE Performed at [facility], [place] [address] [place], [place] [address]    Culture (A)  Final    <10,000 COLONIES/mL INSIGNIFICANT GROWTH Performed at [facility] [address] [place], [place] [address]    Report Status 07/18/2017 FINAL  Final         Radiology Studies: Dg Chest 2 View  Result Date: 07/16/2017 CLINICAL DATA:  Patient arrived via [facility] from home. Patient has a brain tumor (glial neoplasm) and being treated for chemo. Increased lethargy today. Altered mental status. EXAM: CHEST -  2 VIEW COMPARISON:  Chest x-ray dated 09/03/2015. FINDINGS: Heart size and mediastinal contours are stable. Probable mild bibasilar atelectasis, stable. Lungs otherwise clear. No pleural effusion or pneumothorax seen. Stable elevation of the RIGHT hemidiaphragm. Osseous structures about the chest are unremarkable. IMPRESSION: No active cardiopulmonary disease. No evidence of pneumonia or pulmonary edema. Electronically Signed   By: Franki Cabot M.D.   On: 07/16/2017 18:43   Ct Head Wo Contrast  Result Date: 07/16/2017 CLINICAL DATA:  History of brain tumor increased lethargy EXAM: CT HEAD WITHOUT CONTRAST TECHNIQUE: Contiguous axial images were obtained from the base of the skull through the vertex without intravenous contrast. COMPARISON:  06/19/2017, 04/17/2017, 03/11/2017 MRI, 01/09/2017 MRI, head CT 12/15/2011 FINDINGS: Brain: No hemorrhage or large vessel infarct is seen. Extensive hypodensity in the left greater than white white matter,  grossly similar distribution compared with most recent MRI from April 2019. Low-density foci in the left caudate and temporal lobe, corresponding to MRI demonstrated masses. Stable ventricle size. Vascular: No hyperdense vessels.  Carotid vascular calcification Skull: No fracture.  Left tempoparietal craniotomy. Sinuses/Orbits: No acute finding. Other: None IMPRESSION: 1. No definite CT evidence for acute intracranial abnormality. 2. Extensive white matter changes in the left greater than right hemisphere, grossly similar distribution compared to most recent MRI. Low-attenuation foci in the left caudate and temporal lobe presumably corresponding to the previously noted MRI masses. No significant mass effect or midline shift. 3. Atrophy Electronically Signed   By: Donavan Foil M.D.   On: 07/16/2017 19:10        Scheduled Meds: . amLODipine  5 mg Oral Daily  . amphetamine-dextroamphetamine  30 mg Oral Daily  . cholecalciferol  1,000 Units Oral Daily  . dexamethasone  2 mg Oral BID  . Difluprednate  1 drop Ophthalmic QID  . enoxaparin (LOVENOX) injection  40 mg Subcutaneous Q24H  . feeding supplement (ENSURE ENLIVE)  237 mL Oral BID BM  . ketorolac  1 drop Right Eye QID  . lacosamide  50 mg Oral BID  . pantoprazole  40 mg Oral Daily  . sodium bicarbonate  650 mg Oral BID  . venlafaxine XR  75 mg Oral Daily   Continuous Infusions: . cefTRIAXone (ROCEPHIN)  IV Stopped (07/18/17 0962)  . magnesium sulfate 1 - 4 g bolus IVPB 2 g (07/18/17 1151)     LOS: 2 days    Time spent: 35 minutes    Irine Seal, MD [facility] Pager [phone]  If 7PM-7AM, please contact night-coverage [email or web address] Password [facility] 07/18/2017, 12:49 PM

## 2017-07-18 NOTE — Telephone Encounter (Signed)
Patients spouse called to request that upon discharge the patient be transferred to [facility] for rehabilitation.  He states that he feels she could have physical therapy there and also family companionship because her brother lives there as well.

## 2017-07-18 NOTE — NC FL2 (Addendum)
[place] LEVEL OF CARE SCREENING TOOL     IDENTIFICATION  Patient Name: Kelly Maxwell Birthdate: [birth year] Sex: female Admission Date (Current Location): 07/16/2017  [facility] and [place] Number:  Herbalist and Address:  [facility],  [place], [place]      Provider Number: [id number]  Attending Physician Name and Address:  Eugenie Filler, MD  Relative Name and Phone Number:       Current Level of Care: Hospital Recommended Level of Care: [place] Prior Approval Number:    Date Approved/Denied:   PASRR Number: pending  Discharge Plan: SNF    Current Diagnoses: Patient Active Problem List   Diagnosis Date Noted  . Acute metabolic encephalopathy 62/05/5595  . Dehydration 07/16/2017  . 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 Anxiety and depression 09/03/2015  . Personal history of other infectious and parasitic diseases 09/03/2015  . 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 HLD (hyperlipidemia)   . GERD (gastroesophageal reflux disease)   . PONV (postoperative nausea and vomiting)   . 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 Hiatal hernia     Orientation RESPIRATION BLADDER Height & Weight     Self, Time, Place  Normal Incontinent Weight: 152 lb 1.9 oz (69 kg) Height:  5\' 5"  (165.1 cm)  BEHAVIORAL SYMPTOMS/MOOD NEUROLOGICAL BOWEL NUTRITION STATUS      Continent Diet(regular diet)  AMBULATORY STATUS COMMUNICATION OF NEEDS Skin   Extensive Assist Verbally Normal                       Personal Care Assistance Level of Assistance  Bathing, Feeding, Dressing Bathing Assistance: Maximum assistance Feeding assistance: Independent Dressing Assistance: Limited assistance     Functional Limitations Info  Sight, Hearing, Speech Sight Info: Adequate Hearing Info: Adequate Speech Info: Adequate    SPECIAL CARE FACTORS  FREQUENCY  PT (By licensed PT), OT (By licensed OT)     PT Frequency: 5x OT Frequency: 5x            Contractures Contractures Info: Not present    Additional Factors Info  Code Status, Allergies Code Status Info: full code Allergies Info: Codeine, Succinylcholine Chloride, Compazine Prochlorperazine Edisylate           Current Medications (07/18/2017):  This is the current hospital active medication list Current Facility-Administered Medications  Medication Dose Route Frequency Provider Last Rate Last Dose  . acetaminophen (TYLENOL) tablet 650 mg  650 mg Oral Q6H PRN Hugelmeyer, Alexis, DO       Or  . acetaminophen (TYLENOL) suppository 650 mg  650 mg Rectal Q6H PRN Hugelmeyer, Alexis, DO      . amLODipine (NORVASC) tablet 5 mg  5 mg Oral Daily Eugenie Filler, MD      . amphetamine-dextroamphetamine (ADDERALL) tablet 30 mg  30 mg Oral Daily Hugelmeyer, Alexis, DO   30 mg at 07/17/17 1033  . bisacodyl (DULCOLAX) EC tablet 5 mg  5 mg Oral Daily PRN Hugelmeyer, Alexis, DO      . cefTRIAXone (ROCEPHIN) 1 g in sodium chloride 0.9 % 100 mL IVPB  1 g Intravenous Q24H Eugenie Filler, MD 200 mL/hr at 07/18/17 0837 1 g at 07/18/17 0837  . cholecalciferol (VITAMIN D) tablet 1,000 Units  1,000 Units Oral Daily Hugelmeyer, Alexis, DO   1,000 Units at 07/17/17 1020  . dexamethasone (DECADRON) tablet 2 mg  2 mg Oral BID Hugelmeyer, Alexis, DO   2 mg at 07/17/17 2150  . Difluprednate 0.05 % EMUL 1 drop  1 drop Ophthalmic QID Hugelmeyer, Alexis, DO      . enoxaparin (LOVENOX) injection 40 mg  40 mg Subcutaneous Q24H Hugelmeyer, Alexis, DO   40 mg at 07/17/17 1023  . feeding supplement (ENSURE ENLIVE) (ENSURE ENLIVE) liquid 237 mL  237 mL Oral BID BM Eugenie Filler, MD   237 mL at 07/17/17 1815  . ipratropium-albuterol (DUONEB) 0.5-2.5 (3) MG/3ML nebulizer solution 3 mL  3 mL Nebulization Q6H PRN Eugenie Filler, MD      . ketorolac (ACULAR) 0.5 % ophthalmic solution 1 drop  1 drop  Right Eye QID Hugelmeyer, Alexis, DO   1 drop at 07/17/17 2151  . lacosamide (VIMPAT) tablet 50 mg  50 mg Oral BID Hugelmeyer, Alexis, DO   50 mg at 07/17/17 2150  . magnesium citrate solution 1 Bottle  1 Bottle Oral Once PRN Hugelmeyer, Alexis, DO      . magnesium sulfate IVPB 2 g 50 mL  2 g Intravenous Once Eugenie Filler, MD      . ondansetron [facility]) tablet 8 mg  8 mg Oral Q8H PRN Hugelmeyer, Alexis, DO      . pantoprazole (PROTONIX) EC tablet 40 mg  40 mg Oral Daily Eugenie Filler, MD   40 mg at 07/17/17 1826  . senna-docusate (Senokot-S) tablet 1 tablet  1 tablet Oral QHS PRN Hugelmeyer, Alexis, DO      . sodium bicarbonate tablet 650 mg  650 mg Oral BID Eugenie Filler, MD      . venlafaxine XR [facility]) 24 hr capsule 75 mg  75 mg Oral Daily Hugelmeyer, Alexis, DO   75 mg at 07/17/17 1020     Discharge Medications: Please see discharge summary for a list of discharge medications.  Relevant Imaging Results:  Relevant Lab Results:   Additional Information [id number]. Receives chemotherapy outpatient at [facility], [place]

## 2017-07-18 NOTE — Evaluation (Signed)
Occupational Therapy Evaluation Patient Details Name: Kelly Maxwell MRN: [id number] DOB: Aug 21, 1945 Today's Date: 07/18/2017    History of Present Illness 72 y.o. female with a known history of glioblastoma diagnosed in 2013 status post radiation and chemotherapy, depression, hyperlipidemia presents to the emergency department for evaluation of AMS, weakness   Clinical Impression   Pt admitted with AMS. Pt currently with functional limitations due to the deficits listed below (see OT Problem List).  Pt will benefit from skilled OT to increase their safety and independence with ADL and functional mobility for ADL to facilitate discharge to venue listed below.      Follow Up Recommendations  SNF    Equipment Recommendations  None recommended by OT    Recommendations for Other Services       Precautions / Restrictions Precautions Precautions: Fall Precaution Comments: fell in March with attempting sit to stand and LEs buckled, no other falls in past year Restrictions Weight Bearing Restrictions: No      Mobility Bed Mobility Overal bed mobility: Needs Assistance Bed Mobility: Supine to Sit;Sit to Supine     Supine to sit: Mod assist Sit to supine: Mod assist   General bed mobility comments: mod A to raise trunk  Transfers Overall transfer level: Needs assistance Equipment used: 1 person hand held assist Transfers: Sit to/from Stand Sit to Stand: Mod assist              Balance Overall balance assessment: Needs assistance;History of Falls   Sitting balance-Leahy Scale: Poor Sitting balance - Comments: when reaching for items     Standing balance-Leahy Scale: Fair Standing balance comment: needs single UE support for dynamic standing                           ADL either performed or assessed with clinical judgement   ADL Overall ADL's : Needs assistance/impaired Eating/Feeding: Minimal assistance;Sitting   Grooming: Minimal  assistance;Sitting           Upper Body Dressing : Moderate assistance;Sitting   Lower Body Dressing: Maximal assistance;Sit to/from stand;Cueing for sequencing;Cueing for safety       Toileting- Clothing Manipulation and Hygiene: Sit to/from stand;Maximal assistance;Cueing for safety;Cueing for sequencing         General ADL Comments: Pt vision limiting ADL activity at times. Husband reported this had been a challenge since cataract sx.  Pt often over shooting or undershooting for items.  Pt with decreased awareness. Pt sitting EOB and tried to reach for drink which was too far .  OT intervened.  Pt did not realize this was too far for her to reach     Vision Baseline Vision/History: Cataracts Vision Assessment?: Vision impaired- to be further tested in functional context Additional Comments: pt had troube obtaining items on tray - overshooting or undershooting.  Will further assess. 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   General Comments               Home Living Family/patient expects to be discharged to:: Skilled nursing facility Living Arrangements: Spouse/significant other;Children Available Help at Discharge: Family;Available 24 hours/day Type of Home: House Home Access: Stairs to enter [employer] of Steps: 5 Entrance Stairs-Rails: Right Home Layout: Two level;Able to live on main level with bedroom/bathroom Alternate Level Stairs-Number of Steps: flight             Home Equipment:  Walker - 2 wheels;Bedside commode;Shower seat          Prior Functioning/Environment Level of Independence: Needs assistance  Gait / Transfers Assistance Needed: walks without AD; daughter notes pt has had more difficulty with sit to stand in past month, in general pt resists physical activity per daughter ADL's / Homemaking Assistance Needed: assist for bathing and dressing just in past month   Comments: has a walker but doesn't use it        OT Problem List: Decreased strength;Decreased activity tolerance;Impaired balance (sitting and/or standing);Decreased safety awareness;Decreased knowledge of precautions;Decreased knowledge of use of DME or AE;Impaired vision/perception      OT Treatment/Interventions: Self-care/ADL training;Patient/family education;DME and/or AE instruction;Therapeutic activities    OT Goals(Current goals can be found in the care plan section) Acute Rehab OT Goals Patient Stated Goal: daughter would like mother to be more active and mobile OT Goal Formulation: With patient Time For Goal Achievement: 07/25/17 ADL Goals Pt Will Perform Grooming: with supervision;standing Pt Will Perform Upper Body Dressing: with supervision;sitting Pt Will Perform Lower Body Dressing: with min assist;sit to/from stand Pt Will Transfer to Toilet: with supervision;ambulating;regular height toilet Pt Will Perform Toileting - Clothing Manipulation and hygiene: with supervision;sit to/from stand  OT Frequency: Min 2X/week   Barriers to D/C:               AM-PAC PT "6 Clicks" Daily Activity     Outcome Measure Help from another person eating meals?: A Little Help from another person taking care of personal grooming?: A Little Help from another person toileting, which includes using toliet, bedpan, or urinal?: A Lot Help from another person bathing (including washing, rinsing, drying)?: A Lot Help from another person to put on and taking off regular upper body clothing?: A  Little Help from another person to put on and taking off regular lower body clothing?: A Lot 6 Click Score: 15   End of Session Nurse Communication: Mobility status  Activity Tolerance: Patient tolerated treatment well Patient left: in bed;with call bell/phone within reach;with nursing/sitter in room  OT Visit Diagnosis: Unsteadiness on feet (R26.81);History of falling (Z91.81);Other abnormalities of gait and mobility (R26.89);Muscle weakness (generalized) (M62.81)                Time: 6440-3474 OT Time Calculation (min): 19 min Charges:  OT General Charges $OT Visit: 1 Visit OT Evaluation $OT Eval Moderate Complexity: 1 Mod G-Codes:     Kari Baars, [place]  Payton Mccallum D 07/18/2017, 10:26 AM

## 2017-07-19 DIAGNOSIS — E785 Hyperlipidemia, unspecified: Secondary | ICD-10-CM

## 2017-07-19 DIAGNOSIS — I10 Essential (primary) hypertension: Secondary | ICD-10-CM | POA: Diagnosis present

## 2017-07-19 LAB — BASIC METABOLIC PANEL
Anion gap: 7 (ref 5–15)
BUN: 20 mg/dL (ref 6–20)
CO2: 22 mmol/L (ref 22–32)
CREATININE: 0.37 mg/dL — AB (ref 0.44–1.00)
Calcium: 8.1 mg/dL — ABNORMAL LOW (ref 8.9–10.3)
Chloride: 112 mmol/L — ABNORMAL HIGH (ref 101–111)
GFR calc Af Amer: >60 mL/min (ref >60)
Glucose, Bld: 130 mg/dL — ABNORMAL HIGH (ref 65–99)
POTASSIUM: 3.8 mmol/L (ref 3.5–5.1)
SODIUM: 141 mmol/L (ref 135–145)

## 2017-07-19 MED ORDER — SENNOSIDES-DOCUSATE SODIUM 8.6-50 MG PO TABS: 1.0000 | ORAL_TABLET | Freq: Every evening | ORAL | Status: DC | PRN

## 2017-07-19 MED ORDER — CLONAZEPAM 0.5 MG PO TABS: 0.5000 mg | ORAL_TABLET | Freq: Every evening | ORAL | 0 refills | Status: DC | PRN

## 2017-07-19 MED ORDER — AMPHETAMINE-DEXTROAMPHETAMINE 30 MG PO TABS: 30.0000 mg | ORAL_TABLET | Freq: Every day | ORAL | 0 refills | Status: DC

## 2017-07-19 MED ORDER — PANTOPRAZOLE SODIUM 40 MG PO TBEC: 40.0000 mg | DELAYED_RELEASE_TABLET | Freq: Every day | ORAL | 0 refills | Status: DC

## 2017-07-19 MED ORDER — AMLODIPINE BESYLATE 5 MG PO TABS: 5.0000 mg | ORAL_TABLET | Freq: Every day | ORAL | 0 refills | Status: DC

## 2017-07-19 MED ORDER — ENSURE ENLIVE PO LIQD: 237.0000 mL | Freq: Two times a day (BID) | ORAL | 0 refills | Status: DC

## 2017-07-19 NOTE — Progress Notes (Signed)
Per [facility] rep, pt is active with [facility] for HHPT. Per CSW note, pt is being worked up for SNF. CM will continue to follow. Marney Doctor RN,BSN,NCM [phone]

## 2017-07-19 NOTE — Progress Notes (Signed)
Pt had heavy nosebleed lasting approximately 1 hour. Son and daughter state she gets these at home sometimes. Finally resolved with use of afrin nasal spray. Continue to monitor. Hortencia Conradi RN

## 2017-07-19 NOTE — Progress Notes (Signed)
[facility]  Patient Status: Active (receiving services up to time of hospitalization)  [facility] is providing the following services: PT  If patient discharges after hours, please call [phone].   Edwinna Areola 07/19/2017, 10:42 AM

## 2017-07-19 NOTE — Progress Notes (Signed)
[employer] Authorization for SNF placement is still pending. The patient has been discharged. CSW informed the patient spouse and daughter at bedside. The spouse states he will take the patient home and wait for [employer] Authorization to be received.  He reports the patient hospital bed was delivered today.  CSW informed the case manager who has paged physician for home health orders.  CSW informed the the SNF admission coordinator-Kenisha about the patient plan to d/c home and sent d/c summary.   The facility will call patient spouse or daughter when [place] Authorization is received.   Kathrin Greathouse, Latanya Presser, MSW Clinical Social Worker  [phone] 07/19/2017  3:57 PM

## 2017-07-19 NOTE — Progress Notes (Signed)
Physical Therapy Treatment Patient Details Name: Kelly Maxwell MRN: [id number] DOB: 02-07-1946 Today's Date: 07/19/2017    History of Present Illness 72 y.o. female with a known history of glioblastoma diagnosed in 2013 status post radiation and chemotherapy, depression, hyperlipidemia presents to the emergency department for evaluation of AMS, weakness    PT Comments    Progressing with mobility. Pt participated well but she remains confused. Poor safety awareness. She was agreeable to use the RW for ambulation on today. She tolerated an increased ambulation distance fairly well but remains limited by weakness and fatigue.     Follow Up Recommendations  SNF     Equipment Recommendations  None recommended by PT    Recommendations for Other Services       Precautions / Restrictions Precautions Precautions: Fall Precaution Comments: fell in March with attempting sit to stand and LEs buckled, no other falls in past year Restrictions Weight Bearing Restrictions: No    Mobility  Bed Mobility Overal bed mobility: Needs Assistance Bed Mobility: Supine to Sit     Supine to sit: Min assist;HOB elevated     General bed mobility comments: Increased time. Assist for trunk. Cues for safety, technique, and to encourage pt to complete task.   Transfers Overall transfer level: Needs assistance Equipment used: Rolling walker (2 wheeled) Transfers: Sit to/from Stand Sit to Stand: Min assist         General transfer comment: Assist to rise, stabilize, control descent. VCs safety, technique, hand placement. Increased time.   Ambulation/Gait Ambulation/Gait assistance: Min assist Ambulation Distance (Feet): 45 Feet Assistive device: Rolling walker (2 wheeled) Gait Pattern/deviations: Step-through pattern;Trunk flexed     General Gait Details: Pt was agreeable to use walker on today. Assist to maneuver RW and stabilize pt throughout distance. 3 brief stand rest breaks taken due  to fatigue. Cues for safety.    Stairs             Wheelchair Mobility    Modified Rankin (Stroke Patients Only)       Balance Overall balance assessment: Needs assistance         Standing balance support: Bilateral upper extremity supported Standing balance-Leahy Scale: Poor                              Cognition Arousal/Alertness: Awake/alert Behavior During Therapy: WFL for tasks assessed/performed Overall Cognitive Status: History of cognitive impairments - at baseline                                 General Comments: daughter reports pt has dementia from brain tumor/chemo      Exercises      General Comments        Pertinent Vitals/Pain Pain Assessment: No/denies pain    Home Living                      Prior Function            PT Goals (current goals can now be found in the care plan section) Progress towards PT goals: Progressing toward goals    Frequency    Min 3X/week      PT Plan      Co-evaluation              AM-PAC PT "6 Clicks" Daily Activity  Outcome Measure  Difficulty turning  over in bed (including adjusting bedclothes, sheets and blankets)?: A Lot Difficulty moving from lying on back to sitting on the side of the bed? : Unable Difficulty sitting down on and standing up from a chair with arms (e.g., wheelchair, bedside commode, etc,.)?: Unable Help needed moving to and from a bed to chair (including a wheelchair)?: A Little Help needed walking in hospital room?: A Little Help needed climbing 3-5 steps with a railing? : A Lot 6 Click Score: 12    End of Session Equipment Utilized During Treatment: Gait belt Activity Tolerance: Patient tolerated treatment well Patient left: in chair;with call bell/phone within reach;with family/visitor present   PT Visit Diagnosis: Unsteadiness on feet (R26.81);History of falling (Z91.81);Muscle weakness (generalized) (M62.81);Difficulty in  walking, not elsewhere classified (R26.2)     Time: 8657-8469 PT Time Calculation (min) (ACUTE ONLY): 15 min  Charges:  $Gait Training: 8-22 mins                    G Codes:          Weston Anna, MPT Pager: [phone]

## 2017-07-19 NOTE — Care Management Important Message (Signed)
Important Message  Patient Details  Name: Kelly Maxwell MRN: [id number] Date of Birth: 07-Aug-1945   Medicare Important Message Given:  Yes    Kerin Salen 07/19/2017, 1:00 [place] Message  Patient Details  Name: Kelly Maxwell MRN: [id number] Date of Birth: 03-15-1945   Medicare Important Message Given:  Yes    Kerin Salen 07/19/2017, 1:00 PM

## 2017-07-19 NOTE — Discharge Summary (Addendum)
Physician Discharge Summary  Kelly Maxwell JKK:[id number] DOB: [birth year] DOA: 07/16/2017  PCP: Kelly Low, MD  Admit date: 07/16/2017 Discharge date: 07/19/2017  Time spent: 55 minutes  Recommendations for Outpatient Follow-up:  1. Follow-up with Kelly Low, MD 1 to 2 weeks.  On follow-up patient will need a basic metabolic profile done to follow-up on electrolytes and renal function.  Patient's blood pressure also needs to be followed up upon. 2. Follow-up with Dr. Mickeal Maxwell, neuro oncology in 2 weeks or as previously scheduled.  On follow-up patient's cognitive decline will need to be reassessed as patient's Aricept was discontinued during this hospitalization.   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 female with a known history of glioblastoma diagnosed in 2013 status post radiation and chemotherapy, depression, hyperlipidemia presented to the emergency department for evaluation of weakness.  Patient was in a usual state of health until about 4 days ago when she started Aricept which her family believed had caused her to have increased confusion, lethargy, nausea, decreased by mouth intake. Patient's husband stated that she has had intermittent episodes of confusion but the recent change was much worse than her usual fluctuations. She had not eaten anything on the day of admission. Her husband stated that she was seen in an emergency department in [place] on Palm Sunday and diagnosed with a urinary tract infection. She was taking Cipro which was  prescribed through May 17. On questioning the patient was confused, answered some questions.  EMS/ED Course: Patient received 2 L normal saline. Medical admission was requested for further management of altered mental status, dehydration.    Hospital Course:  1 acute metabolic encephalopathy Likely multifactorial secondary to known glioblastoma, medication side effects from probably ciprofloxacin plus or minus Aricept and dehydration.  Patient was admitted repeat urine cultures were obtained patient placed empirically initially IV ciprofloxacin which was subsequently changed to IV Rocephin, placed on IV fluids with supportive care. Urine cultures with less than 10,000 colonies however patient was on antibiotics prior to admission and as such maintained on IV antibiotics and finished her course of antibiotic treatment 07/19/2017.  Patient was also hydrated with IV fluids and was euvolemic by day of discharge.  Patient improved clinically and was likely close to baseline by day of discharge.  Patient will be discharged in stable and improved condition.   2.  dehydration Patient noted to be dehydrated on admission was hydrated with IV fluids throughout the hospitalization.  Patient was euvolemic by day of discharge.    3.  Depression/anxiety Was stable throughout the hospitalization.  Patient maintained on home regimen of Effexor.    4.  Gastroesophageal reflux disease Patient maintained on a PPI.   5.  Gliosarcoma- left temporal lobe status post treatment 2013 Patient currently on Avastin and being followed by neuro oncology.  No seizures noted during the hospitalization.  Patient noted to have some recent cognitive decline and was started on Aricept.  Due to concerns that Aricept may be causing the symptoms this was discontinued.  CT head negative  for any acute abnormalities.    Patient was maintained on Vimpat and Decadron throughout the hospitalization.  Outpatient follow-up with neuro  oncology. Dr. Mickeal Maxwell.  6.  Cognitive decline/deficits Are noted to have recent cognitive decline and had been started on Aricept 10 mg nightly per her neuro oncologist.  Due to concerns that Aricept may have led to her acute metabolic encephalopathy this was discontinued during the hospitalization and patient will follow up with a neuro oncologist for further evaluation to determine whether patient may be resumed back on Aricept at a lower dose.  CT head which was done on admission showed extensive white matter changes in the left greater than right hemisphere, grossly similar distribution compared to most recent MRI with no acute abnormalities.  Outpatient follow-up.  7.  HTN Patient noted to have systolic blood pressures elevated in the 150s and 160s during the hospitalization.  Patient started on Norvasc 5 mg Maxwell.  Outpatient follow-up.     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     Compazine [prochlorperazine Edisylate] Other (See Comments), Rash   Became hyper Became hyper      Medication List    STOP taking these medications   ciprofloxacin 500 MG tablet Commonly known as:  CIPRO   donepezil 10 MG tablet Commonly known as:  ARICEPT     TAKE these medications   amLODipine 5 MG tablet Commonly known as:  NORVASC Take 1 tablet (5 mg total) by  mouth Maxwell. Start taking on:  07/20/2017   amphetamine-dextroamphetamine 30 MG tablet Commonly known as:  ADDERALL Take 1 tablet by mouth Maxwell.   cholecalciferol 1000 units tablet Commonly known as:  VITAMIN D Take 1 tablet (1,000 Units total) by mouth Maxwell.   clonazePAM 0.5 MG tablet Commonly known as:  KLONOPIN Take 1 tablet (0.5 mg total) by mouth at bedtime as needed for anxiety.   dexamethasone 2 MG tablet Commonly known as:  DECADRON Take 1 tablet (2 mg total) by mouth 2 (two) times Maxwell.   DUREZOL 0.05 % Emul Generic drug:  Difluprednate Apply 1 drop to eye 4 (four) times Maxwell.   feeding supplement (ENSURE ENLIVE) Liqd Take 237 mLs by mouth 2 (two) times Maxwell between meals.   lacosamide 50 MG Tabs tablet Commonly known as:  VIMPAT Take 1 tablet (50 mg total) by mouth 2 (two) times Maxwell.   LORazepam 0.5 MG tablet Commonly known as:  ATIVAN TAKE 1 TABLET 30 TO 60 MINUTES PRIOR TO MRI. MAY REPEAT ONCE FOR 1 ADDITIONAL RELIEF   Melatonin 1 MG Caps Take 2 capsules by mouth at bedtime as needed (sleep).   ondansetron 8 MG tablet Commonly known as:  ZOFRAN Take 1 tab (8mg ) by mouth 1 hour before Maxwell temozolomide. Take every 8  hours as needed for nausea.   pantoprazole 40 MG tablet Commonly known as:  PROTONIX Take 1 tablet (40 mg total) by mouth Maxwell. Start taking on:  07/20/2017   PROLENSA 0.07 % Soln Generic drug:  Bromfenac Sodium Place 1 drop into the right eye 3 (three) times Maxwell. Reported on 09/02/2015   senna-docusate 8.6-50 MG tablet Commonly known as:  Senokot-S Take 1 tablet by mouth at bedtime as needed for mild constipation.   tretinoin 0.05 % cream Commonly known as:  RETIN-A Apply 1 application topically at bedtime as needed. spots   Venlafaxine HCl 37.5 MG Tb24 Take 75 mg by mouth Maxwell.      Allergies  Allergen Reactions  . Codeine Nausea And Vomiting and Nausea Only  . 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    . Compazine [Prochlorperazine Edisylate] Other (See Comments) and Rash    Became hyper Became hyper    Contact information for follow-up providers    Vaslow, Acey Lav, MD. Schedule an appointment as soon as possible for a visit.   Specialties:  Psychiatry, Neurology, Oncology Why:  f/u in 2 weeks or as scheduled. Contact information: [place] [id number] [phone]        MD AT SNF Follow up.        Kelly Low, MD. Schedule an appointment as soon as possible for a visit in 2 week(s).   Specialty:  Internal Medicine Why:  f/u in 1-2 weeks Contact information: [address], [address] [place] [address] [phone]            Contact information for after-discharge care    Destination    [facility] .   Service:  Skilled Nursing Contact information: [place] [phone]                   The results of significant diagnostics from this hospitalization (including imaging, microbiology, ancillary and laboratory) are listed below for reference.    Significant Diagnostic Studies: Dg Chest 2 View  Result Date: 07/16/2017 CLINICAL DATA:  Patient arrived via [facility] from home. Patient has a brain tumor (glial neoplasm) and being treated for chemo. Increased lethargy today. Altered mental status. EXAM: CHEST - 2 VIEW COMPARISON:  Chest x-ray dated 09/03/2015. FINDINGS: Heart size and mediastinal contours are stable. Probable mild bibasilar atelectasis, stable. Lungs otherwise clear. No pleural effusion or pneumothorax seen. Stable elevation of the RIGHT hemidiaphragm. Osseous structures about the chest are unremarkable. IMPRESSION: No active cardiopulmonary disease. No evidence of pneumonia or pulmonary edema. Electronically Signed   By: Franki Cabot M.D.   On: 07/16/2017 18:43   Ct Head Wo Contrast  Result Date:  07/16/2017 CLINICAL DATA:  History of brain tumor increased lethargy EXAM: CT HEAD WITHOUT CONTRAST TECHNIQUE: Contiguous axial images were obtained from the base of the skull through the vertex without intravenous contrast. COMPARISON:  06/19/2017, 04/17/2017, 03/11/2017 MRI, 01/09/2017 MRI, head CT 12/15/2011 FINDINGS: Brain: No hemorrhage or large vessel infarct is seen. Extensive hypodensity in the left greater than white white matter, grossly similar distribution compared with most recent MRI from April 2019. Maxwell-density foci in the left caudate and temporal lobe, corresponding to MRI demonstrated masses. Stable ventricle size. Vascular: No hyperdense vessels.  Carotid vascular calcification Skull: No fracture.  Left tempoparietal craniotomy. Sinuses/Orbits: No acute finding. Other: None IMPRESSION: 1. No definite CT evidence for acute intracranial  abnormality. 2. Extensive white matter changes in the left greater than right hemisphere, grossly similar distribution compared to most recent MRI. Maxwell-attenuation foci in the left caudate and temporal lobe presumably corresponding to the previously noted MRI masses. No significant mass effect or midline shift. 3. Atrophy Electronically Signed   By: Donavan Foil M.D.   On: 07/16/2017 19:10   Mr Jeri Cos HW Contrast  Result Date: 06/19/2017 CLINICAL DATA:  72 y/o F; gliosarcoma for follow-up. History of resection in 2013. Recent treatment with Avastin. EXAM: MRI HEAD WITHOUT AND WITH CONTRAST TECHNIQUE: Multiplanar, multiecho pulse sequences of the brain and surrounding structures were obtained without and with intravenous contrast. CONTRAST:  17mL MULTIHANCE GADOBENATE DIMEGLUMINE 529 MG/ML IV SOLN COMPARISON:  04/17/2017, 03/11/2017, 01/09/2017, 11/06/2016 MRI of the head. FINDINGS: Brain: Confluent T2 FLAIR hyperintense signal of white matter volume left temporal lobe, parietal lobe, and frontal lobe as well as the left external capsule which crosses corpus  callosum to involve right frontal white matter. Distribution of T2 FLAIR signal is stable. Mass effect within the left anterior temporal lobe is decreased from the prior study. Enhancing lesions: 1. Left anterolateral temporal lobe, 20 x 12 x 11 mm, previously 27 x 16 x 17 mm (series 12: Image 23 and 13:17). 2. Left caudate nucleus, 15 mm, previously 8 mm (14:15). 3. Left superior temporal lobe, barely visible, previously punctate (12:29). 4. Left medial temporal lobe, no longer identified, previously punctate. The left caudate nucleus lesion which has increased in size demonstrates intermediate to Maxwell T2 signal and reduced diffusion (500:26 and 7:12) favoring neoplasm. No evidence for acute stroke, hemorrhage, or new area of mass effect. No extra-axial collection, hydrocephalus, or effacement of basilar cisterns. Vascular: Normal flow voids. Skull and upper cervical spine: Chronic postsurgical changes related to left temporal craniotomy. Sinuses/Orbits: Negative. Other: None. IMPRESSION: 1. No acute process identified. 2. Decreased enhancement and mass effect of the left lateral temporal lobe lesion may represent treatment response or Avastin effect. 3. Increased size of left caudate nucleus lesion demonstrating Maxwell T2 signal and reduced diffusion favoring enhancing neoplasm. 4. Stable confluent T2 FLAIR signal abnormality throughout left hemisphere and right frontal white matter compatible with posttreatment changes and/or nonenhancing tumor. Electronically Signed   By: Kristine Garbe M.D.   On: 06/19/2017 18:57    Microbiology: Recent Results (from the past 240 hour(s))  Culture, Urine     Status: Abnormal   Collection Time: 07/16/17  6:35 PM  Result Value Ref Range Status   Specimen Description   Final    URINE, CLEAN CATCH Performed at [facility], [place] [address], [place], [place] [address]    Special Requests   Final    NONE Performed at [facility], [place] [address] [place], [place] [address]    Culture (A)  Final    <10,000 COLONIES/mL INSIGNIFICANT GROWTH Performed at [facility] [address], [place], [place] [address]    Report Status 07/18/2017 FINAL  Final     Labs: Basic Metabolic Panel: Recent Labs  Lab 07/16/17 1745 07/17/17 0341 07/18/17 0358 07/19/17 0353  NA 140 139 139 141  K 4.0 4.0 3.8 3.8  CL 107 108 115* 112*  CO2 22 18* 18* 22  GLUCOSE 88 69 104* 130*  BUN 17 13 8 20   CREATININE 0.43* 0.38* <0.30* 0.37*  CALCIUM 8.9 8.1* 7.9* 8.1*  MG  --   --  1.8  --    Liver  Function Tests: Recent Labs  Lab 07/16/17 1745  AST 23  ALT 33  ALKPHOS 63  BILITOT 1.4*  PROT 6.3*  ALBUMIN 3.6   No results for input(s): LIPASE, AMYLASE in the last 168 hours. No results for input(s): AMMONIA in the last 168 hours. CBC: Recent Labs  Lab 07/16/17 1745 07/17/17 0341 07/18/17 0358  WBC 9.8 8.1 7.5  NEUTROABS 8.7*  --  6.5  HGB 14.5 13.4 11.8*  HCT 42.9 38.9 35.4*  MCV 94.3 93.1 94.4  PLT 86* 74* 71*   Cardiac Enzymes: No results for input(s): CKTOTAL, CKMB, CKMBINDEX, TROPONINI in the last 168 hours. BNP: BNP (last 3 results) No results for input(s): BNP in the last 8760 hours.  ProBNP (last 3 results) No results for input(s): PROBNP in the last 8760 hours.  CBG: Recent Labs  Lab 07/16/17 1727  GLUCAP 77       Signed:  Irine Seal MD.  [facility] 07/19/2017, 3:45 PM

## 2017-07-19 NOTE — Progress Notes (Signed)
CSW updated patient spouse-[employer] Authorization for SNF placement is still pending at this time.   Kathrin Greathouse, Latanya Presser, MSW Clinical Social Worker  [phone] 07/19/2017  12:42 PM

## 2017-07-19 NOTE — Progress Notes (Signed)
Discharge instructions reviewed with patient. Patient verbalized understanding. 

## 2017-07-22 DIAGNOSIS — K5900 Constipation, unspecified: Secondary | ICD-10-CM | POA: Diagnosis not present

## 2017-07-22 DIAGNOSIS — E860 Dehydration: Secondary | ICD-10-CM | POA: Diagnosis not present

## 2017-07-22 DIAGNOSIS — M6281 Muscle weakness (generalized): Secondary | ICD-10-CM | POA: Diagnosis not present

## 2017-07-22 DIAGNOSIS — I69811 Memory deficit following other cerebrovascular disease: Secondary | ICD-10-CM | POA: Diagnosis not present

## 2017-07-22 DIAGNOSIS — R69 Illness, unspecified: Secondary | ICD-10-CM | POA: Diagnosis not present

## 2017-07-22 DIAGNOSIS — Z5112 Encounter for antineoplastic immunotherapy: Secondary | ICD-10-CM | POA: Diagnosis present

## 2017-07-22 DIAGNOSIS — B372 Candidiasis of skin and nail: Secondary | ICD-10-CM | POA: Diagnosis not present

## 2017-07-22 DIAGNOSIS — R41841 Cognitive communication deficit: Secondary | ICD-10-CM | POA: Diagnosis not present

## 2017-07-22 DIAGNOSIS — R2689 Other abnormalities of gait and mobility: Secondary | ICD-10-CM | POA: Diagnosis not present

## 2017-07-22 DIAGNOSIS — R238 Other skin changes: Secondary | ICD-10-CM | POA: Diagnosis not present

## 2017-07-22 DIAGNOSIS — M25562 Pain in left knee: Secondary | ICD-10-CM | POA: Diagnosis not present

## 2017-07-22 DIAGNOSIS — R1310 Dysphagia, unspecified: Secondary | ICD-10-CM | POA: Diagnosis not present

## 2017-07-22 DIAGNOSIS — G9341 Metabolic encephalopathy: Secondary | ICD-10-CM | POA: Diagnosis not present

## 2017-07-22 DIAGNOSIS — Z9221 Personal history of antineoplastic chemotherapy: Secondary | ICD-10-CM | POA: Diagnosis not present

## 2017-07-22 DIAGNOSIS — C712 Malignant neoplasm of temporal lobe: Secondary | ICD-10-CM | POA: Diagnosis present

## 2017-07-22 DIAGNOSIS — I10 Essential (primary) hypertension: Secondary | ICD-10-CM | POA: Diagnosis not present

## 2017-07-22 DIAGNOSIS — C719 Malignant neoplasm of brain, unspecified: Secondary | ICD-10-CM | POA: Diagnosis not present

## 2017-07-22 DIAGNOSIS — R3915 Urgency of urination: Secondary | ICD-10-CM | POA: Diagnosis not present

## 2017-07-22 DIAGNOSIS — Z923 Personal history of irradiation: Secondary | ICD-10-CM | POA: Diagnosis not present

## 2017-07-22 DIAGNOSIS — L22 Diaper dermatitis: Secondary | ICD-10-CM | POA: Diagnosis not present

## 2017-07-22 DIAGNOSIS — M25519 Pain in unspecified shoulder: Secondary | ICD-10-CM | POA: Diagnosis not present

## 2017-07-22 DIAGNOSIS — M25561 Pain in right knee: Secondary | ICD-10-CM | POA: Diagnosis not present

## 2017-07-22 DIAGNOSIS — R404 Transient alteration of awareness: Secondary | ICD-10-CM | POA: Diagnosis not present

## 2017-07-22 DIAGNOSIS — R278 Other lack of coordination: Secondary | ICD-10-CM | POA: Diagnosis not present

## 2017-07-23 DIAGNOSIS — K5900 Constipation, unspecified: Secondary | ICD-10-CM | POA: Diagnosis not present

## 2017-07-23 DIAGNOSIS — C719 Malignant neoplasm of brain, unspecified: Secondary | ICD-10-CM | POA: Diagnosis not present

## 2017-07-23 DIAGNOSIS — G9341 Metabolic encephalopathy: Secondary | ICD-10-CM | POA: Diagnosis not present

## 2017-07-23 DIAGNOSIS — E860 Dehydration: Secondary | ICD-10-CM | POA: Diagnosis not present

## 2017-07-24 DIAGNOSIS — R238 Other skin changes: Secondary | ICD-10-CM | POA: Diagnosis not present

## 2017-07-24 DIAGNOSIS — R69 Illness, unspecified: Secondary | ICD-10-CM | POA: Diagnosis not present

## 2017-07-25 NOTE — Progress Notes (Signed)
FMLA successfully faxed to  [facility] to Domenic Polite at [phone]. Mailed copy to patient address on file.

## 2017-07-26 DIAGNOSIS — I10 Essential (primary) hypertension: Secondary | ICD-10-CM | POA: Diagnosis not present

## 2017-07-26 DIAGNOSIS — R238 Other skin changes: Secondary | ICD-10-CM | POA: Diagnosis not present

## 2017-07-26 DIAGNOSIS — G9341 Metabolic encephalopathy: Secondary | ICD-10-CM | POA: Diagnosis not present

## 2017-07-26 DIAGNOSIS — C719 Malignant neoplasm of brain, unspecified: Secondary | ICD-10-CM | POA: Diagnosis not present

## 2017-07-29 DIAGNOSIS — R238 Other skin changes: Secondary | ICD-10-CM | POA: Diagnosis not present

## 2017-07-31 ENCOUNTER — Telehealth: Payer: Self-pay

## 2017-07-31 NOTE — Telephone Encounter (Signed)
Spoke with pt's spouse regarding getting her scheduled for Avastin. Informed pt's spouse that MD Mickeal Skinner is aware that they want to come back for treatment. Informed pt's spouse that Romie Minus is off today but that she will call them tomorrow with further plans about pt's schedule. Pt's spouse verbalizes understanding and agrees with plan of care.

## 2017-08-01 ENCOUNTER — Telehealth: Payer: Self-pay | Admitting: *Deleted

## 2017-08-01 NOTE — Telephone Encounter (Signed)
Scheduling message sent to have next lab/md/infusion appt scheduled as soon as possible.  Advised patient's spouse that we would call him back with appt time and date once schedulers processed.

## 2017-08-02 ENCOUNTER — Telehealth: Payer: Self-pay

## 2017-08-02 DIAGNOSIS — M25562 Pain in left knee: Secondary | ICD-10-CM | POA: Diagnosis not present

## 2017-08-02 DIAGNOSIS — M25561 Pain in right knee: Secondary | ICD-10-CM | POA: Diagnosis not present

## 2017-08-02 NOTE — Telephone Encounter (Signed)
Called Vaslow RN Maudie Mercury) with the appt. Dates as requested. Was unable to speak with her . Per 5/30 sch msg

## 2017-08-05 DIAGNOSIS — M25561 Pain in right knee: Secondary | ICD-10-CM | POA: Diagnosis not present

## 2017-08-05 DIAGNOSIS — M25519 Pain in unspecified shoulder: Secondary | ICD-10-CM | POA: Diagnosis not present

## 2017-08-05 DIAGNOSIS — C719 Malignant neoplasm of brain, unspecified: Secondary | ICD-10-CM | POA: Diagnosis not present

## 2017-08-07 DIAGNOSIS — G9341 Metabolic encephalopathy: Secondary | ICD-10-CM | POA: Diagnosis not present

## 2017-08-07 DIAGNOSIS — C719 Malignant neoplasm of brain, unspecified: Secondary | ICD-10-CM | POA: Diagnosis not present

## 2017-08-07 DIAGNOSIS — E860 Dehydration: Secondary | ICD-10-CM | POA: Diagnosis not present

## 2017-08-07 DIAGNOSIS — I10 Essential (primary) hypertension: Secondary | ICD-10-CM | POA: Diagnosis not present

## 2017-08-08 ENCOUNTER — Other Ambulatory Visit: Payer: Self-pay | Admitting: Internal Medicine

## 2017-08-08 ENCOUNTER — Other Ambulatory Visit: Payer: Self-pay | Admitting: *Deleted

## 2017-08-08 DIAGNOSIS — C719 Malignant neoplasm of brain, unspecified: Secondary | ICD-10-CM

## 2017-08-09 ENCOUNTER — Inpatient Hospital Stay: Payer: Medicare HMO

## 2017-08-09 ENCOUNTER — Inpatient Hospital Stay: Payer: Medicare HMO | Admitting: Medical

## 2017-08-09 ENCOUNTER — Inpatient Hospital Stay: Payer: Medicare HMO | Attending: Internal Medicine | Admitting: Internal Medicine

## 2017-08-09 ENCOUNTER — Other Ambulatory Visit: Payer: Self-pay | Admitting: Internal Medicine

## 2017-08-09 ENCOUNTER — Encounter: Payer: Self-pay | Admitting: Internal Medicine

## 2017-08-09 VITALS — BP 131/83 | HR 108 | Temp 97.9°F | Resp 18 | Ht 65.0 in | Wt 153.1 lb

## 2017-08-09 DIAGNOSIS — C719 Malignant neoplasm of brain, unspecified: Secondary | ICD-10-CM

## 2017-08-09 DIAGNOSIS — Z923 Personal history of irradiation: Secondary | ICD-10-CM | POA: Diagnosis not present

## 2017-08-09 DIAGNOSIS — T82598A Other mechanical complication of other cardiac and vascular devices and implants, initial encounter: Secondary | ICD-10-CM

## 2017-08-09 DIAGNOSIS — I69811 Memory deficit following other cerebrovascular disease: Secondary | ICD-10-CM | POA: Diagnosis not present

## 2017-08-09 DIAGNOSIS — R404 Transient alteration of awareness: Secondary | ICD-10-CM | POA: Diagnosis not present

## 2017-08-09 DIAGNOSIS — Z5112 Encounter for antineoplastic immunotherapy: Secondary | ICD-10-CM | POA: Diagnosis not present

## 2017-08-09 DIAGNOSIS — Z9221 Personal history of antineoplastic chemotherapy: Secondary | ICD-10-CM | POA: Insufficient documentation

## 2017-08-09 DIAGNOSIS — C712 Malignant neoplasm of temporal lobe: Secondary | ICD-10-CM | POA: Diagnosis not present

## 2017-08-09 LAB — CBC WITH DIFFERENTIAL (CANCER CENTER ONLY)
BASOS ABS: 0 10*3/uL (ref 0.0–0.1)
BASOS PCT: 0 %
EOS ABS: 0.1 10*3/uL (ref 0.0–0.5)
EOS PCT: 2 %
HCT: 40.7 % (ref 34.8–46.6)
HEMOGLOBIN: 13.5 g/dL (ref 11.6–15.9)
LYMPHS ABS: 0.8 10*3/uL — AB (ref 0.9–3.3)
Lymphocytes Relative: 9 %
MCH: 32.8 pg (ref 25.1–34.0)
MCHC: 33.2 g/dL (ref 31.5–36.0)
MCV: 99 fL (ref 79.5–101.0)
Monocytes Absolute: 0.3 10*3/uL (ref 0.1–0.9)
Monocytes Relative: 3 %
NEUTROS PCT: 86 %
Neutro Abs: 7.8 10*3/uL — ABNORMAL HIGH (ref 1.5–6.5)
PLATELETS: 180 10*3/uL (ref 145–400)
RBC: 4.11 MIL/uL (ref 3.70–5.45)
RDW: 18.8 % — ABNORMAL HIGH (ref 11.2–14.5)
WBC: 9.1 10*3/uL (ref 3.9–10.3)

## 2017-08-09 LAB — CMP (CANCER CENTER ONLY)
ALK PHOS: 99 U/L (ref 40–150)
ALT: 121 U/L — AB (ref 0–55)
ANION GAP: 10 (ref 3–11)
AST: 29 U/L (ref 5–34)
Albumin: 3.3 g/dL — ABNORMAL LOW (ref 3.5–5.0)
BILIRUBIN TOTAL: 0.9 mg/dL (ref 0.2–1.2)
BUN: 15 mg/dL (ref 7–26)
CO2: 22 mmol/L (ref 22–29)
CREATININE: 0.57 mg/dL — AB (ref 0.60–1.10)
Calcium: 8.9 mg/dL (ref 8.4–10.4)
Chloride: 108 mmol/L (ref 98–109)
GFR, Est AFR Am: >60 mL/min (ref >60)
Glucose, Bld: 87 mg/dL (ref 70–140)
Potassium: 4.2 mmol/L (ref 3.5–5.1)
SODIUM: 140 mmol/L (ref 136–145)
TOTAL PROTEIN: 6 g/dL — AB (ref 6.4–8.3)

## 2017-08-09 MED ORDER — AMPHETAMINE-DEXTROAMPHETAMINE 30 MG PO TABS: 30.0000 mg | ORAL_TABLET | Freq: Every day | ORAL | 0 refills | Status: DC

## 2017-08-09 MED ORDER — SODIUM CHLORIDE 0.9 % IV SOLN
10.0000 mg/kg | Freq: Once | INTRAVENOUS | Status: AC
  Administered 2017-08-09: 700 mg via INTRAVENOUS
  Filled 2017-08-09: qty 16

## 2017-08-09 MED ORDER — PANTOPRAZOLE SODIUM 40 MG PO TBEC: 40.0000 mg | DELAYED_RELEASE_TABLET | Freq: Every day | ORAL | 3 refills | Status: DC

## 2017-08-09 MED ORDER — SODIUM CHLORIDE 0.9 % IV SOLN
Freq: Once | INTRAVENOUS | Status: AC
  Administered 2017-08-09: 14:00:00 via INTRAVENOUS

## 2017-08-09 MED ORDER — DEXAMETHASONE 2 MG PO TABS: 2.0000 mg | ORAL_TABLET | Freq: Every day | ORAL | 2 refills | Status: DC

## 2017-08-09 MED ORDER — AMLODIPINE BESYLATE 5 MG PO TABS: 5.0000 mg | ORAL_TABLET | Freq: Every day | ORAL | 3 refills | Status: DC

## 2017-08-09 NOTE — Progress Notes (Signed)
Per Dr Mickeal Skinner , it is okay to proceed with avastin and no urine protein result from today. Pt incontinent of urine.

## 2017-08-09 NOTE — Progress Notes (Signed)
Approx 1443- Patient pulled out IV catheter on accident. No obvious signs of infiltration of drug, but some bruising present. No edema, coolness or warmth to the site. Sandi Mealy, PA assessed site and pt advised to use cool compress for bruising.

## 2017-08-09 NOTE — Patient Instructions (Signed)
[facility] Discharge Instructions for Patients Receiving Chemotherapy  Today you received the following: Avastin.  To help prevent nausea and vomiting after your treatment, we encourage you to take your nausea medication as prescribed.   If you develop nausea and vomiting that is not controlled by your nausea medication, call the clinic.   BELOW ARE SYMPTOMS THAT SHOULD BE REPORTED IMMEDIATELY:  *FEVER GREATER THAN 100.5 F  *CHILLS WITH OR WITHOUT FEVER  NAUSEA AND VOMITING THAT IS NOT CONTROLLED WITH YOUR NAUSEA MEDICATION  *UNUSUAL SHORTNESS OF BREATH  *UNUSUAL BRUISING OR BLEEDING  TENDERNESS IN MOUTH AND THROAT WITH OR WITHOUT PRESENCE OF ULCERS  *URINARY PROBLEMS  *BOWEL PROBLEMS  UNUSUAL RASH Items with * indicate a potential emergency and should be followed up as soon as possible.  Feel free to call the clinic should you have any questions or concerns. The clinic phone number is [phone].  Please show the [facility] at check-in to the Emergency Department and triage nurse.

## 2017-08-09 NOTE — Progress Notes (Signed)
[facility] [address], [place] [address] [phone]   Interval Evaluation  Date of Service: 08/09/17 Patient Name: Kelly Maxwell Patient MRN: [id number] Patient DOB: 1945/09/03 Provider: Ventura Sellers, MD  Identifying Statement:  Kelly Maxwell is a 72 y.o. 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  Path demonstrates Gliosarcoma       - 02/18/2012 Radiation Therapy    Completes RT and concurrent Temodar with Dr. Tammi Klippel       - 09/14/2013 Chemotherapy    Completes 18 cycles of adjuvant Temodar, treated at [facility].       09/29/2015 Progression    Progression #1.  Starts metronomic temodar 50mg /m2 daily       - 09/26/2016 Chemotherapy    Completes 1 year metronomic temodar       04/19/2017 Progression    Progression #2.  Recommendation for Avastin 10mg /kg q2 weeks       Interval History:  Kelly Maxwell presents today for Avastin.  She returned home from rehab yesterday.  Patient and family describe overall improvement in alertness, energy, speech.  Her motor function is still impaired and she is confined to wheelchair today, although with rehab she has been active and doing some walking.    Medications: Current Outpatient Medications on File Prior to Visit  Medication Sig Dispense Refill  . amLODipine (NORVASC) 5 MG tablet Take 1 tablet (5 mg total) by mouth daily. 30 tablet 0  . amphetamine-dextroamphetamine (ADDERALL) 30 MG tablet Take 1 tablet by mouth daily. 20 tablet 0  . Bromfenac Sodium (PROLENSA) 0.07 % SOLN Place 1 drop into the right eye 3 (three) times daily. Reported on 09/02/2015    . cholecalciferol (VITAMIN D) 1000 units tablet Take 1 tablet (1,000 Units total) by mouth daily. 30 tablet 3  . clonazePAM  (KLONOPIN) 0.5 MG tablet Take 1 tablet (0.5 mg total) by mouth at bedtime as needed for anxiety. 12 tablet 0  . dexamethasone (DECADRON) 2 MG tablet Take 1 tablet (2 mg total) by mouth 2 (two) times daily. 60 tablet 2  . Difluprednate (DUREZOL) 0.05 % EMUL Apply 1 drop to eye 4 (four) times daily.    . feeding supplement, ENSURE ENLIVE, (ENSURE ENLIVE) LIQD Take 237 mLs by mouth 2 (two) times daily between meals. 237 mL 0  . lacosamide (VIMPAT) 50 MG TABS tablet Take 1 tablet (50 mg total) by mouth 2 (two) times daily. 60 tablet 3  . LORazepam (ATIVAN) 0.5 MG tablet TAKE 1 TABLET 30 TO 60 MINUTES PRIOR TO MRI. MAY REPEAT ONCE FOR 1 ADDITIONAL RELIEF  0  . Melatonin 1 MG CAPS Take 2 capsules by mouth at bedtime as needed (sleep).     . ondansetron (ZOFRAN) 8 MG tablet Take 1 tab (8mg ) by mouth 1 hour before daily temozolomide. Take every 8 hours as needed for nausea.    . pantoprazole (PROTONIX) 40 MG tablet Take 1 tablet (40 mg total) by mouth daily. 30 tablet 0  . senna-docusate (SENOKOT-S) 8.6-50 MG tablet Take 1 tablet by mouth at bedtime as needed for mild constipation.    Marland Kitchen tretinoin (RETIN-A) 0.05 % cream Apply 1 application topically at bedtime as needed.  spots  0  . Venlafaxine HCl 37.5 MG TB24 Take 75 mg by mouth daily.   3   No current facility-administered medications on file prior to visit.     Allergies:  Allergies  Allergen Reactions  . Codeine Nausea And Vomiting and Nausea Only  . 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    . Compazine [Prochlorperazine Edisylate] Other (See Comments) and Rash    Became hyper Became hyper   Past Medical History:  Past Medical History:  Diagnosis Date  . Cancer ([place])   . Depression   . GERD (gastroesophageal reflux disease)   . Glial neoplasm of brain ([place]) 12/17/11   left temporal, grade IV, 2.7 x 3.6 x 2.5 cm ring enhancing; numerous small surrounding satellite  lesions 12/13/11  . Headache(784.0)   . Hiatal hernia   . History of radiation therapy 01/07/2012-02/18/12   left temporal  60GY  . HLD (hyperlipidemia)   . PONV (postoperative nausea and vomiting)   . 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 BUNIONECTOMY  2001   right  . CATARACT EXTRACTION Left   . CRANIOTOMY  12/17/2011   Procedure: CRANIOTOMY TUMOR EXCISION;  Surgeon: Otilio Connors, MD;  Location: [facility];  Service: Neurosurgery;  Laterality: Left;  LEFT temporal craniotomy with stealth for tumor resection  . DILATION AND CURETTAGE OF UTERUS  2007  . TOTAL KNEE ARTHROPLASTY  2000   left  . VENTRAL HERNIA REPAIR N/A 09/03/2015   Procedure: LAPAROSCOPIC repair of incarcerated spigelian hernia with mesh, reduction and repair ;  Surgeon: [name], MD;  Location: [facility];  Service: General;  Laterality: N/A;   Social History:  Social History   Socioeconomic History  . Marital status: Married    Spouse name: Not on file  . Number of children: 3  . Years of education: Not on file  . Highest education level: Not on file  Occupational History  . Not on file  Social Needs  . Financial resource strain: Not on file  . Food insecurity:    Worry: Not on file    Inability: Not on file  . Transportation needs:    Medical: Not on file    Non-medical: Not on file  Tobacco Use  . Smoking status: Never Smoker  . Smokeless tobacco: Never Used  Substance and Sexual Activity  . Alcohol use: No  . Drug use: No  . Sexual activity: Yes  Lifestyle  . Physical activity:    Days per week: Not on file    Minutes per session: Not on file  . Stress: Not on file  Relationships  . Social connections:    Talks on phone: Not on file    Gets together: Not on file    Attends religious service: Not on file    Active member of club or  organization: Not on file    Attends meetings of clubs or organizations: Not on file    Relationship status: Not on file  . Intimate partner violence:    Fear of current or ex partner: Not on file    Emotionally abused: Not on file    Physically abused: Not on file    Forced sexual activity: Not on file  Other Topics Concern  . Not on file  Social History Narrative  2nd marriage of 6 years, previously widowed   Family History:  Family History  Problem Relation Age of Onset  . Heart disease Mother     Review of Systems: Constitutional: Denies fevers, chills or abnormal weight loss Eyes: Denies blurriness of vision Ears, nose, mouth, throat, and face: denies hoarseness Respiratory: Denies cough, dyspnea or wheezes Cardiovascular: Denies palpitation, chest discomfort or lower extremity swelling Gastrointestinal:  Denies nausea, constipation, diarrhea GU: Denies dysuria or incontinence Skin: Denies abnormal skin rashes Neurological: Per HPI Musculoskeletal: Denies joint pain, back or neck discomfort. No decrease in ROM Behavioral/Psych: Denies anxiety, disturbance in thought content, and mood instability  Physical Exam: Vitals:   08/09/17 1218  BP: 131/83  Pulse: (!) 108  Resp: 18  Temp: 97.9 F (36.6 C)  SpO2: 96%   KPS: 80. General: Alert, cooperative, pleasant, in no acute distress Head: Normal EENT: No conjunctival injection or scleral icterus. Oral mucosa moist Lungs: Resp effort normal Cardiac: Regular rate and rhythm Abdomen: Soft, non-distended abdomen Skin: No rashes cyanosis or petechiae. Extremities: No clubbing or edema  Neurologic Exam: Mental Status: Alert and attentive. Oriented to self and environemnt.  Language is impaired to fluency and comprehension.  Struggles with multiple step commands.   Cranial Nerves: Visual acuity is grossly normal. Visual fields are full. Extra-ocular movements intact. Left eye ptosis, chronic. Face is symmetric, tongue  midline. Motor: Tone and bulk are normal. Power is full in both arms and legs. Reflexes are symmetric, no pathologic reflexes present. Intact finger to nose bilaterally Sensory: Intact to light touch and temperature Gait: Deferred today   Labs: I have reviewed the data as listed    Component Value Date/Time   NA 141 07/19/2017 0353   NA 140 09/11/2013 1238   K 3.8 07/19/2017 0353   K 4.7 09/11/2013 1238   CL 112 (H) 07/19/2017 0353   CL 111 (H) 08/04/2012 1008   CO2 22 07/19/2017 0353   CO2 23 09/11/2013 1238   GLUCOSE 130 (H) 07/19/2017 0353   GLUCOSE 87 09/11/2013 1238   GLUCOSE 86 08/04/2012 1008   BUN 20 07/19/2017 0353   BUN 13.3 10/29/2014 1127   CREATININE 0.37 (L) 07/19/2017 0353   CREATININE 0.60 06/28/2017 1248   CREATININE 0.8 10/29/2014 1127   CALCIUM 8.1 (L) 07/19/2017 0353   CALCIUM 9.5 09/11/2013 1238   PROT 6.3 (L) 07/16/2017 1745   PROT 6.6 09/11/2013 1238   ALBUMIN 3.6 07/16/2017 1745   ALBUMIN 3.9 09/11/2013 1238   AST 23 07/16/2017 1745   AST 18 06/28/2017 1248   AST 19 09/11/2013 1238   ALT 33 07/16/2017 1745   ALT 28 06/28/2017 1248   ALT 27 09/11/2013 1238   ALKPHOS 63 07/16/2017 1745   ALKPHOS 106 09/11/2013 1238   BILITOT 1.4 (H) 07/16/2017 1745   BILITOT 0.6 06/28/2017 1248   BILITOT 0.72 09/11/2013 1238   GFRNONAA >60 07/19/2017 0353   GFRNONAA >60 06/28/2017 1248   GFRAA >60 07/19/2017 0353   GFRAA >60 06/28/2017 1248   Lab Results  Component Value Date   WBC 7.5 07/18/2017   NEUTROABS 6.5 07/18/2017   HGB 11.8 (L) 07/18/2017   HCT 35.4 (L) 07/18/2017   MCV 94.4 07/18/2017   PLT 71 (L) 07/18/2017    Assessment/Plan 1. 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    She is cleared to receive Avastin infusion today as scheduled, 10mg /kg IV.  Chemotherapy should be held for  the following:  ANC less than 500  Platelets less than 50,000  LFT or creatinine greater than 2x ULN  If  clinical concerns/contraindications develop  We will re-fill norvasc, adderal, protonix as requested.  Decadron should be decreaesed to 2mg  daily.  Should return in 2 weeks for next scheduled treatment with an MRI brain for evaluation.  We appreciate the opportunity to participate in the care of Kelly Maxwell.   All questions were answered. The patient knows to call the clinic with any problems, questions or concerns. No barriers to learning were detected.  The total time spent in the encounter was 25 minutes and more than 50% was on counseling and review of test results   Ventura Sellers, MD Medical Director of Neuro-Oncology [facility] at [place] 08/09/17 12:13 PM

## 2017-08-12 ENCOUNTER — Other Ambulatory Visit: Payer: Self-pay | Admitting: *Deleted

## 2017-08-12 DIAGNOSIS — C719 Malignant neoplasm of brain, unspecified: Secondary | ICD-10-CM

## 2017-08-12 NOTE — Progress Notes (Signed)
Patient spouse called requesting home health the be reinstated for the home since being discharged from [facility] and home health nurse aide if possible with assistance with baths and personal care for the patient.  Orders approved per Vaslow and [place] called to advise.

## 2017-08-12 NOTE — Progress Notes (Signed)
[facility] Progress Note   Kelly Maxwell [id number] 1945-09-11 72 y.o.  [name] is managed by Dr. Mickeal Skinner  Actively treated with chemotherapy/immunotherapy: yes  Current Therapy: Bevacizumab  Last Treated: 08/09/2017  Assessment: Plan:    Dysfunction of intravenous infusion line, initial encounter ([place])   Loss of IV site in the left dorsal hand: The patient was receiving Avastin at the time that she got up to go to the bathroom.  When she returned it was noted that there was bleeding from the site of her IV and that the IV was malfunctioning.  It did not appear that there was an extravasation of Avastin.  A literature review was completed that showed that Avastin is a neutral agent is not expected to cause tissue breakdown or irritation even if extravasation to recur.  The patient was reassured and told to use cool packs to the area for 20 minutes on 4 several times over the weekend.  She is instructed to return as needed.  Please see After Visit Summary for patient specific instructions.  Future Appointments  Date Time Provider [place]  08/23/2017 10:00 AM [facility] LAB 1 [facility] None  08/23/2017 10:40 AM Vaslow, Acey Lav, MD [facility] None  08/23/2017 11:45 AM [facility] B5 [facility] None  11/18/2017  1:00 PM Hayden Pedro, MD [facility] None    No orders of the defined types were placed in this encounter.      Subjective:   Patient ID:  Kelly Maxwell is a 72 y.o. (DOB 1945/07/26) female.  Chief Complaint: No chief complaint on file.   HPI Kelly Maxwell is a 72 year old female with a glial sarcoma of the left temporal lobe who is managed by Dr. Mickeal Skinner and he presented to clinic today for an infusion of bevacizumab which she was receiving when she got up to go to the bathroom.  When she returned it was noted that there was bleeding from the site of her IV and that the IV was malfunctioning.  It did not appear that there was an  extravasation of Avastin.  A literature review was completed that showed that Avastin is a neutral agent is not expected to cause tissue breakdown or irritation even if extravasation to recur.  The patient was reassured and told to use cool packs to the area for 20 minutes on 4 several times over the weekend.  She is instructed to return as needed.  An IV was started in the right dorsal hand with the patient receiving the remainder of her infusion.  Medications: I have reviewed the patient's current medications.  Allergies:  Allergies  Allergen Reactions  . Codeine Nausea And Vomiting and Nausea Only  . 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    . Compazine [Prochlorperazine Edisylate] Other (See Comments) and Rash    Became hyper Became hyper    Past Medical History:  Diagnosis Date  . Cancer ([place])   . Depression   . GERD (gastroesophageal reflux disease)   . Glial neoplasm of brain ([place]) 12/17/11   left temporal, grade IV, 2.7 x 3.6 x 2.5 cm ring enhancing; numerous small surrounding satellite lesions 12/13/11  . Headache(784.0)   . Hiatal hernia   . History of radiation therapy 01/07/2012-02/18/12   left temporal  60GY  . HLD (hyperlipidemia)   . PONV (postoperative nausea and vomiting)   . 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 BUNIONECTOMY  2001   right  . CATARACT EXTRACTION Left   . CRANIOTOMY  12/17/2011   Procedure: CRANIOTOMY TUMOR EXCISION;  Surgeon: Otilio Connors, MD;  Location: [facility];  Service: Neurosurgery;  Laterality: Left;  LEFT temporal craniotomy with stealth for tumor resection  . DILATION AND CURETTAGE OF UTERUS  2007  . TOTAL KNEE ARTHROPLASTY  2000   left  . VENTRAL HERNIA REPAIR N/A 09/03/2015   Procedure: LAPAROSCOPIC repair of incarcerated  spigelian hernia with mesh, reduction and repair ;  Surgeon: [name], MD;  Location: [facility];  Service: General;  Laterality: N/A;    Family History  Problem Relation Age of Onset  . Heart disease Mother     Social History   Socioeconomic History  . Marital status: Married    Spouse name: Not on file  . Number of children: 3  . Years of education: Not on file  . Highest education level: Not on file  Occupational History  . Not on file  Social Needs  . Financial resource strain: Not on file  . Food insecurity:    Worry: Not on file    Inability: Not on file  . Transportation needs:    Medical: Not on file    Non-medical: Not on file  Tobacco Use  . Smoking status: Never Smoker  . Smokeless tobacco: Never Used  Substance and Sexual Activity  . Alcohol use: No  . Drug use: No  . Sexual activity: Yes  Lifestyle  . Physical activity:    Days per week: Not on file    Minutes per session: Not on file  . Stress: Not on file  Relationships  . Social connections:    Talks on phone: Not on file    Gets together: Not on file    Attends religious service: Not on file    Active member of club or organization: Not on file    Attends meetings of clubs or organizations: Not on file    Relationship status: Not on file  . Intimate partner violence:    Fear of current or ex partner: Not on file    Emotionally abused: Not on file    Physically abused: Not on file    Forced sexual activity: Not on file  Other Topics Concern  . Not on file  Social History Narrative   2nd marriage of 6 years, previously widowed    Past Medical History, Surgical history, Social history, and Family history were reviewed and updated as appropriate.   Please see review of systems for further details on the patient's review from today.   Review of Systems:  Review of Systems  Skin:       Bleeding around the site of a IV in the left dorsal hand.    Objective:   Physical Exam:  There were  no vitals taken for this visit. ECOG: 0  Physical Exam  Constitutional: No distress.  Skin: Skin is warm and dry. She is not diaphoretic.  There is an IV in the left dorsal hand with evidence of recent bleeding noted.  No effusion is noted.  No erythema, increased warmth, or active bleeding noted.  Psychiatric: She has a normal mood and affect. Her behavior is normal. Judgment and thought content normal.    Lab Review:     Component Value Date/Time   NA 140 08/09/2017 1159  NA 140 09/11/2013 1238   K 4.2 08/09/2017 1159   K 4.7 09/11/2013 1238   CL 108 08/09/2017 1159   CL 111 (H) 08/04/2012 1008   CO2 22 08/09/2017 1159   CO2 23 09/11/2013 1238   GLUCOSE 87 08/09/2017 1159   GLUCOSE 87 09/11/2013 1238   GLUCOSE 86 08/04/2012 1008   BUN 15 08/09/2017 1159   BUN 13.3 10/29/2014 1127   CREATININE 0.57 (L) 08/09/2017 1159   CREATININE 0.8 10/29/2014 1127   CALCIUM 8.9 08/09/2017 1159   CALCIUM 9.5 09/11/2013 1238   PROT 6.0 (L) 08/09/2017 1159   PROT 6.6 09/11/2013 1238   ALBUMIN 3.3 (L) 08/09/2017 1159   ALBUMIN 3.9 09/11/2013 1238   AST 29 08/09/2017 1159   AST 19 09/11/2013 1238   ALT 121 (H) 08/09/2017 1159   ALT 27 09/11/2013 1238   ALKPHOS 99 08/09/2017 1159   ALKPHOS 106 09/11/2013 1238   BILITOT 0.9 08/09/2017 1159   BILITOT 0.72 09/11/2013 1238   GFRNONAA >60 08/09/2017 1159   GFRAA >60 08/09/2017 1159       Component Value Date/Time   WBC 9.1 08/09/2017 1159   WBC 7.5 07/18/2017 0358   RBC 4.11 08/09/2017 1159   HGB 13.5 08/09/2017 1159   HGB 14.1 09/11/2013 1238   HCT 40.7 08/09/2017 1159   HCT 41.9 09/11/2013 1238   PLT 180 08/09/2017 1159   PLT 248 09/11/2013 1238   MCV 99.0 08/09/2017 1159   MCV 92.3 09/11/2013 1238   MCH 32.8 08/09/2017 1159   MCHC 33.2 08/09/2017 1159   RDW 18.8 (H) 08/09/2017 1159   RDW 14.4 09/11/2013 1238   LYMPHSABS 0.8 (L) 08/09/2017 1159   LYMPHSABS 0.8 (L) 09/11/2013 1238   MONOABS 0.3 08/09/2017 1159   MONOABS  0.3 09/11/2013 1238   EOSABS 0.1 08/09/2017 1159   EOSABS 0.1 09/11/2013 1238   BASOSABS 0.0 08/09/2017 1159   BASOSABS 0.0 09/11/2013 1238   -------------------------------  Imaging from last 24 hours (if applicable):  Radiology interpretation: Dg Chest 2 View  Result Date: 07/16/2017 CLINICAL DATA:  Patient arrived via [facility] from home. Patient has a brain tumor (glial neoplasm) and being treated for chemo. Increased lethargy today. Altered mental status. EXAM: CHEST - 2 VIEW COMPARISON:  Chest x-ray dated 09/03/2015. FINDINGS: Heart size and mediastinal contours are stable. Probable mild bibasilar atelectasis, stable. Lungs otherwise clear. No pleural effusion or pneumothorax seen. Stable elevation of the RIGHT hemidiaphragm. Osseous structures about the chest are unremarkable. IMPRESSION: No active cardiopulmonary disease. No evidence of pneumonia or pulmonary edema. Electronically Signed   By: Franki Cabot M.D.   On: 07/16/2017 18:43   Ct Head Wo Contrast  Result Date: 07/16/2017 CLINICAL DATA:  History of brain tumor increased lethargy EXAM: CT HEAD WITHOUT CONTRAST TECHNIQUE: Contiguous axial images were obtained from the base of the skull through the vertex without intravenous contrast. COMPARISON:  06/19/2017, 04/17/2017, 03/11/2017 MRI, 01/09/2017 MRI, head CT 12/15/2011 FINDINGS: Brain: No hemorrhage or large vessel infarct is seen. Extensive hypodensity in the left greater than white white matter, grossly similar distribution compared with most recent MRI from April 2019. Low-density foci in the left caudate and temporal lobe, corresponding to MRI demonstrated masses. Stable ventricle size. Vascular: No hyperdense vessels.  Carotid vascular calcification Skull: No fracture.  Left tempoparietal craniotomy. Sinuses/Orbits: No acute finding. Other: None IMPRESSION: 1. No definite CT evidence for acute intracranial abnormality. 2. Extensive white matter changes in the left greater than right  hemisphere,  grossly similar distribution compared to most recent MRI. Low-attenuation foci in the left caudate and temporal lobe presumably corresponding to the previously noted MRI masses. No significant mass effect or midline shift. 3. Atrophy Electronically Signed   By: Donavan Foil M.D.   On: 07/16/2017 19:10

## 2017-08-16 DIAGNOSIS — R2689 Other abnormalities of gait and mobility: Secondary | ICD-10-CM | POA: Diagnosis not present

## 2017-08-16 DIAGNOSIS — R3915 Urgency of urination: Secondary | ICD-10-CM | POA: Diagnosis not present

## 2017-08-16 DIAGNOSIS — C712 Malignant neoplasm of temporal lobe: Secondary | ICD-10-CM | POA: Diagnosis not present

## 2017-08-16 DIAGNOSIS — L22 Diaper dermatitis: Secondary | ICD-10-CM | POA: Diagnosis not present

## 2017-08-16 DIAGNOSIS — B372 Candidiasis of skin and nail: Secondary | ICD-10-CM | POA: Diagnosis not present

## 2017-08-16 DIAGNOSIS — E860 Dehydration: Secondary | ICD-10-CM | POA: Diagnosis not present

## 2017-08-19 ENCOUNTER — Telehealth: Payer: Self-pay

## 2017-08-19 ENCOUNTER — Other Ambulatory Visit: Payer: Self-pay

## 2017-08-19 DIAGNOSIS — R3915 Urgency of urination: Secondary | ICD-10-CM | POA: Diagnosis not present

## 2017-08-19 DIAGNOSIS — M6281 Muscle weakness (generalized): Secondary | ICD-10-CM | POA: Diagnosis not present

## 2017-08-19 DIAGNOSIS — R269 Unspecified abnormalities of gait and mobility: Secondary | ICD-10-CM | POA: Diagnosis not present

## 2017-08-19 DIAGNOSIS — K436 Other and unspecified ventral hernia with obstruction, without gangrene: Secondary | ICD-10-CM | POA: Diagnosis not present

## 2017-08-19 DIAGNOSIS — Z9181 History of falling: Secondary | ICD-10-CM | POA: Diagnosis not present

## 2017-08-19 NOTE — Telephone Encounter (Signed)
Received call from [employer] PT with [employer] care stating that she has completed home health evaluation and will fax the recommendation for pt 2x/week for 4 weeks in addition to a home health aide for personal care assistance to be signed by MD.

## 2017-08-20 DIAGNOSIS — R2689 Other abnormalities of gait and mobility: Secondary | ICD-10-CM | POA: Diagnosis not present

## 2017-08-20 DIAGNOSIS — C712 Malignant neoplasm of temporal lobe: Secondary | ICD-10-CM | POA: Diagnosis not present

## 2017-08-21 DIAGNOSIS — R69 Illness, unspecified: Secondary | ICD-10-CM | POA: Diagnosis not present

## 2017-08-21 DIAGNOSIS — H5213 Myopia, bilateral: Secondary | ICD-10-CM | POA: Diagnosis not present

## 2017-08-22 ENCOUNTER — Encounter (HOSPITAL_COMMUNITY): Payer: Self-pay

## 2017-08-22 ENCOUNTER — Telehealth: Payer: Self-pay

## 2017-08-22 ENCOUNTER — Ambulatory Visit (HOSPITAL_COMMUNITY): Admission: RE | Admit: 2017-08-22 | Payer: Medicare HMO | Source: Ambulatory Visit

## 2017-08-22 NOTE — Telephone Encounter (Signed)
Received a call from the patient's spouse stating that the patient is too sick to go to the MRI appointment today and he left a message with the MRI department. Upon further questioning he stated that the patient remains in bed all day, minimal bites and sips, and is having cognitive decline. He questions the need for hospice at this time. He stated that they have an appointment with Dr. Mickeal Skinner tomorrow and feels like he can get the patient to the appointment with his daughter's help to discuss options at this time. This [place] contacted MRI and communicated the cancellation of the appointment at this time. Dr. Mickeal Skinner aware.

## 2017-08-23 ENCOUNTER — Inpatient Hospital Stay: Payer: Medicare HMO

## 2017-08-23 ENCOUNTER — Telehealth: Payer: Self-pay

## 2017-08-23 ENCOUNTER — Other Ambulatory Visit: Payer: Self-pay

## 2017-08-23 ENCOUNTER — Encounter: Payer: Self-pay | Admitting: Internal Medicine

## 2017-08-23 ENCOUNTER — Inpatient Hospital Stay (HOSPITAL_BASED_OUTPATIENT_CLINIC_OR_DEPARTMENT_OTHER): Payer: Medicare HMO | Admitting: Internal Medicine

## 2017-08-23 VITALS — BP 111/90 | HR 95 | Temp 98.7°F | Resp 18 | Ht 65.0 in

## 2017-08-23 DIAGNOSIS — C719 Malignant neoplasm of brain, unspecified: Secondary | ICD-10-CM

## 2017-08-23 DIAGNOSIS — Z5112 Encounter for antineoplastic immunotherapy: Secondary | ICD-10-CM | POA: Diagnosis not present

## 2017-08-23 DIAGNOSIS — N3944 Nocturnal enuresis: Secondary | ICD-10-CM | POA: Diagnosis not present

## 2017-08-23 DIAGNOSIS — R404 Transient alteration of awareness: Secondary | ICD-10-CM | POA: Diagnosis not present

## 2017-08-23 DIAGNOSIS — C712 Malignant neoplasm of temporal lobe: Secondary | ICD-10-CM | POA: Diagnosis not present

## 2017-08-23 DIAGNOSIS — I69811 Memory deficit following other cerebrovascular disease: Secondary | ICD-10-CM | POA: Diagnosis not present

## 2017-08-23 DIAGNOSIS — Z923 Personal history of irradiation: Secondary | ICD-10-CM

## 2017-08-23 DIAGNOSIS — R2689 Other abnormalities of gait and mobility: Secondary | ICD-10-CM | POA: Diagnosis not present

## 2017-08-23 DIAGNOSIS — Z9221 Personal history of antineoplastic chemotherapy: Secondary | ICD-10-CM

## 2017-08-23 NOTE — Progress Notes (Signed)
[facility] at [place], [place] [address] [phone]   Interval Evaluation  Date of Service: 08/23/17 Patient Name: Kelly Maxwell Patient MRN: [id number] Patient DOB: [birth year] Provider: Ventura Sellers, MD  Identifying Statement:  Kelly Maxwell is a 72 y.o. 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  Path demonstrates Gliosarcoma       - 02/18/2012 Radiation Therapy    Completes RT and concurrent Temodar with Dr. Tammi Klippel       - 09/14/2013 Chemotherapy    Completes 18 cycles of adjuvant Temodar, treated at [place].       09/29/2015 Progression    Progression #1.  Starts metronomic temodar 50mg /m2 daily       - 09/26/2016 Chemotherapy    Completes 1 year metronomic temodar       04/19/2017 Progression    Progression #2.  Recommendation for Avastin 10mg /kg q2 weeks       Interval History:  Kelly Maxwell presents today for follow up.  She has again experienced decline in alertness, awareness, and functional independence.  Per daughter, there are "lucid periods" where she is quite awake and interactive.  They were unable to obtain MRI because of difficulty with transport and administration of the exam.   Medications: Current Outpatient Medications on File Prior to Visit  Medication Sig Dispense Refill  . amLODipine (NORVASC) 5 MG tablet Take 1 tablet (5 mg total) by mouth daily. 30 tablet 3  . amphetamine-dextroamphetamine (ADDERALL) 30 MG tablet Take 1 tablet by mouth daily. 30 tablet 0  . Bromfenac Sodium (PROLENSA) 0.07 % SOLN Place 1 drop into the right eye 3 (three) times daily. Reported on 09/02/2015    . cholecalciferol (VITAMIN D) 1000 units tablet Take 1 tablet (1,000 Units total) by mouth daily. 30 tablet 3  .  dexamethasone (DECADRON) 2 MG tablet Take 1 tablet (2 mg total) by mouth daily. 60 tablet 2  . Difluprednate (DUREZOL) 0.05 % EMUL Apply 1 drop to eye 4 (four) times daily.    Marland Kitchen lacosamide (VIMPAT) 50 MG TABS tablet Take 1 tablet (50 mg total) by mouth 2 (two) times daily. 60 tablet 3  . Melatonin 5 MG CAPS Take 1-2 capsules by mouth at bedtime as needed (sleep).     . NON FORMULARY Take 1 drop by mouth 2 (two) times daily. Lazarus Natural CBD Oil    . pantoprazole (PROTONIX) 40 MG tablet Take 1 tablet (40 mg total) by mouth daily. 30 tablet 3  . senna-docusate (SENOKOT-S) 8.6-50 MG tablet Take 1 tablet by mouth at bedtime as needed for mild constipation.    . ciprofloxacin (CIPRO) 500 MG tablet Take 1 tablet by mouth daily. Take for 5 days    . clonazePAM (KLONOPIN) 0.5 MG tablet Take 1 tablet (0.5 mg total) by mouth at bedtime as needed for anxiety. (Patient not taking: Reported on 08/09/2017) 12 tablet 0  . LORazepam (ATIVAN) 0.5 MG tablet TAKE 1 TABLET 30 TO 60 MINUTES PRIOR TO MRI. MAY REPEAT ONCE FOR 1 ADDITIONAL RELIEF  0  . nystatin cream (MYCOSTATIN) Apply 1 application topically as needed.    . ondansetron (ZOFRAN) 8 MG tablet Take 1 tab (8mg ) by  mouth 1 hour before daily temozolomide. Take every 8 hours as needed for nausea.    Marland Kitchen tretinoin (RETIN-A) 0.05 % cream Apply 1 application topically at bedtime as needed. spots  0   No current facility-administered medications on file prior to visit.     Allergies:  Allergies  Allergen Reactions  . Codeine Nausea And Vomiting and Nausea Only  . 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    . Compazine [Prochlorperazine Edisylate] Other (See Comments) and Rash    Became hyper Became hyper   Past Medical History:  Past Medical History:  Diagnosis Date  . Cancer ([place])   . Depression   . GERD (gastroesophageal reflux disease)   . Glial neoplasm of brain ([place]) 12/17/11     left temporal, grade IV, 2.7 x 3.6 x 2.5 cm ring enhancing; numerous small surrounding satellite lesions 12/13/11  . Headache(784.0)   . Hiatal hernia   . History of radiation therapy 01/07/2012-02/18/12   left temporal  60GY  . HLD (hyperlipidemia)   . PONV (postoperative nausea and vomiting)   . 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 BUNIONECTOMY  2001   right  . CATARACT EXTRACTION Left   . CRANIOTOMY  12/17/2011   Procedure: CRANIOTOMY TUMOR EXCISION;  Surgeon: Otilio Connors, MD;  Location: [facility];  Service: Neurosurgery;  Laterality: Left;  LEFT temporal craniotomy with stealth for tumor resection  . DILATION AND CURETTAGE OF UTERUS  2007  . TOTAL KNEE ARTHROPLASTY  2000   left  . VENTRAL HERNIA REPAIR N/A 09/03/2015   Procedure: LAPAROSCOPIC repair of incarcerated spigelian hernia with mesh, reduction and repair ;  Surgeon: [name], MD;  Location: [facility];  Service: General;  Laterality: N/A;   Social History:  Social History   Socioeconomic History  . Marital status: Married    Spouse name: Not on file  . Number of children: 3  . Years of education: Not on file  . Highest education level: Not on file  Occupational History  . Not on file  Social Needs  . Financial resource strain: Not on file  . Food insecurity:    Worry: Not on file    Inability: Not on file  . Transportation needs:    Medical: Not on file    Non-medical: Not on file  Tobacco Use  . Smoking status: Never Smoker  . Smokeless tobacco: Never Used  Substance and Sexual Activity  . Alcohol use: No  . Drug use: No  . Sexual activity: Yes  Lifestyle  . Physical activity:    Days per week: Not on file    Minutes per session: Not on file  . Stress: Not on file  Relationships  . Social connections:    Talks on phone: Not on file    Gets  together: Not on file    Attends religious service: Not on file    Active member of club or organization: Not on file    Attends meetings of clubs or organizations: Not on file    Relationship status: Not on file  . Intimate partner violence:    Fear of current or ex partner: Not on file    Emotionally abused: Not on file    Physically abused: Not on file    Forced sexual activity:  Not on file  Other Topics Concern  . Not on file  Social History Narrative   2nd marriage of 6 years, previously widowed   Family History:  Family History  Problem Relation Age of Onset  . Heart disease Mother     Review of Systems: Limited by AMS  Physical Exam: Vitals:   08/23/17 1053  BP: 111/90  Pulse: 95  Resp: 18  Temp: 98.7 F (37.1 C)  SpO2: 97%   KPS: 80. General: Cooperative, pleasant, in no acute distress Head: Normal EENT: No conjunctival injection or scleral icterus. Oral mucosa moist Lungs: Resp effort normal Cardiac: Regular rate and rhythm Abdomen: Soft, non-distended abdomen Skin: No rashes cyanosis or petechiae. Extremities: No clubbing or edema  Neurologic Exam: Mental Status: Drowsy today, struggles to maintain alertness without some stimulus. Oriented to self and environemnt.  Language is impaired to fluency and comprehension.  Struggles with multiple step commands.   Cranial Nerves: Visual acuity is grossly normal. Visual fields are full. Extra-ocular movements intact. Left eye ptosis, chronic. Face is symmetric, tongue midline. Motor: Tone and bulk are normal. Power is full in both arms and legs. Reflexes are symmetric, no pathologic reflexes present. Intact finger to nose bilaterally Sensory: Intact to light touch and temperature Gait: Deferred today   Labs: I have reviewed the data as listed    Component Value Date/Time   NA 140 08/09/2017 1159   NA 140 09/11/2013 1238   K 4.2 08/09/2017 1159   K 4.7 09/11/2013 1238   CL 108 08/09/2017 1159   CL 111 (H)  08/04/2012 1008   CO2 22 08/09/2017 1159   CO2 23 09/11/2013 1238   GLUCOSE 87 08/09/2017 1159   GLUCOSE 87 09/11/2013 1238   GLUCOSE 86 08/04/2012 1008   BUN 15 08/09/2017 1159   BUN 13.3 10/29/2014 1127   CREATININE 0.57 (L) 08/09/2017 1159   CREATININE 0.8 10/29/2014 1127   CALCIUM 8.9 08/09/2017 1159   CALCIUM 9.5 09/11/2013 1238   PROT 6.0 (L) 08/09/2017 1159   PROT 6.6 09/11/2013 1238   ALBUMIN 3.3 (L) 08/09/2017 1159   ALBUMIN 3.9 09/11/2013 1238   AST 29 08/09/2017 1159   AST 19 09/11/2013 1238   ALT 121 (H) 08/09/2017 1159   ALT 27 09/11/2013 1238   ALKPHOS 99 08/09/2017 1159   ALKPHOS 106 09/11/2013 1238   BILITOT 0.9 08/09/2017 1159   BILITOT 0.72 09/11/2013 1238   GFRNONAA >60 08/09/2017 1159   GFRAA >60 08/09/2017 1159   Lab Results  Component Value Date   WBC 9.1 08/09/2017   NEUTROABS 7.8 (H) 08/09/2017   HGB 13.5 08/09/2017   HCT 40.7 08/09/2017   MCV 99.0 08/09/2017   PLT 180 08/09/2017    Assessment/Plan 1. 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  This is likely driven by changes related to her brain tumor or CNS sequelae of treatment, as discussed previously.  We discussed with the family relatively limited utility of further anti-tumor therapy outside of Avastin.  We also discussed that Avastin would no longer be indicated if progression was visualized on CNS imaging.     They prefer that we attempt MRI brain again, with more logistical support for transport.  They will again return after the MRI for further discussion, including potential for hospice referral.  We appreciate the opportunity to participate in the care of Kelly Maxwell.   All questions were answered. The patient  knows to call the clinic with any problems, questions or concerns. No barriers to learning were detected.  The total time spent in the encounter was 25 minutes and more than 50% was on counseling and review of  test results   Ventura Sellers, MD Medical Director of Neuro-Oncology [facility] at [place] 08/23/17 11:11 AM

## 2017-08-23 NOTE — Telephone Encounter (Signed)
Per 6/21 no los 

## 2017-08-26 ENCOUNTER — Other Ambulatory Visit: Payer: Self-pay | Admitting: *Deleted

## 2017-08-26 MED ORDER — AMLODIPINE BESYLATE 5 MG PO TABS: 5.0000 mg | ORAL_TABLET | Freq: Every day | ORAL | 3 refills | Status: DC

## 2017-08-27 ENCOUNTER — Ambulatory Visit
Admission: RE | Admit: 2017-08-27 | Discharge: 2017-08-27 | Disposition: A | Discharge status: Home / Self Care | Payer: Medicare HMO | Source: Ambulatory Visit | Attending: Internal Medicine | Admitting: Internal Medicine

## 2017-08-27 ENCOUNTER — Telehealth: Payer: Self-pay | Admitting: *Deleted

## 2017-08-27 DIAGNOSIS — C719 Malignant neoplasm of brain, unspecified: Secondary | ICD-10-CM | POA: Diagnosis not present

## 2017-08-27 MED ORDER — GADOBENATE DIMEGLUMINE 529 MG/ML IV SOLN
14.0000 mL | Freq: Once | INTRAVENOUS | Status: AC | PRN
  Administered 2017-08-27: 14 mL via INTRAVENOUS

## 2017-08-27 NOTE — Telephone Encounter (Signed)
[facility] called to request a social worker consult to be completed by Catherine as patient is going to need long term assistance and possible a ramp for the home.  Verbal given to proceed.    He also questioned the baseline heart rate for our office visits and when should we be notified.  Advised anything greater than 115 at rest.

## 2017-08-29 DIAGNOSIS — R350 Frequency of micturition: Secondary | ICD-10-CM | POA: Diagnosis not present

## 2017-08-29 DIAGNOSIS — N3946 Mixed incontinence: Secondary | ICD-10-CM | POA: Diagnosis not present

## 2017-08-29 DIAGNOSIS — Z0100 Encounter for examination of eyes and vision without abnormal findings: Secondary | ICD-10-CM | POA: Diagnosis not present

## 2017-08-30 ENCOUNTER — Other Ambulatory Visit: Payer: Self-pay

## 2017-08-30 ENCOUNTER — Ambulatory Visit (HOSPITAL_COMMUNITY): Payer: Medicare HMO

## 2017-08-30 ENCOUNTER — Inpatient Hospital Stay (HOSPITAL_BASED_OUTPATIENT_CLINIC_OR_DEPARTMENT_OTHER): Payer: Medicare HMO | Admitting: Internal Medicine

## 2017-08-30 ENCOUNTER — Encounter: Payer: Self-pay | Admitting: Internal Medicine

## 2017-08-30 VITALS — BP 151/78 | HR 105 | Temp 98.7°F | Resp 17 | Ht 65.0 in | Wt 144.0 lb

## 2017-08-30 DIAGNOSIS — R2689 Other abnormalities of gait and mobility: Secondary | ICD-10-CM | POA: Diagnosis not present

## 2017-08-30 DIAGNOSIS — Z923 Personal history of irradiation: Secondary | ICD-10-CM | POA: Diagnosis not present

## 2017-08-30 DIAGNOSIS — C719 Malignant neoplasm of brain, unspecified: Secondary | ICD-10-CM

## 2017-08-30 DIAGNOSIS — Z5112 Encounter for antineoplastic immunotherapy: Secondary | ICD-10-CM | POA: Diagnosis not present

## 2017-08-30 DIAGNOSIS — C712 Malignant neoplasm of temporal lobe: Secondary | ICD-10-CM

## 2017-08-30 DIAGNOSIS — R404 Transient alteration of awareness: Secondary | ICD-10-CM | POA: Diagnosis not present

## 2017-08-30 DIAGNOSIS — I69811 Memory deficit following other cerebrovascular disease: Secondary | ICD-10-CM | POA: Diagnosis not present

## 2017-08-30 DIAGNOSIS — Z9221 Personal history of antineoplastic chemotherapy: Secondary | ICD-10-CM

## 2017-08-30 NOTE — Progress Notes (Signed)
[facility] at [place], [place] [address] [phone]   Interval Evaluation  Date of Service: 08/30/17 Patient Name: Kelly Maxwell Patient MRN: [id number] Patient DOB: Jul 05, 1945 Provider: Ventura Sellers, MD  Identifying Statement:  Kelly Maxwell is a 72 y.o. 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  Path demonstrates Gliosarcoma       - 02/18/2012 Radiation Therapy    Completes RT and concurrent Temodar with Dr. Tammi Klippel       - 09/14/2013 Chemotherapy    Completes 18 cycles of adjuvant Temodar, treated at [facility].       09/29/2015 Progression    Progression #1.  Starts metronomic temodar 50mg /m2 daily       - 09/26/2016 Chemotherapy    Completes 1 year metronomic temodar       04/19/2017 Progression    Progression #2.  Recommendation for Avastin 10mg /kg q2 weeks       Interval History:  Kelly Maxwell presents today for follow up.  She has continued to experienced decline particularly now with short term memory.  She is not ambulating and swelling in lower legs has increased. They were able to obtain the MRI scan this week.   Medications: Current Outpatient Medications on File Prior to Visit  Medication Sig Dispense Refill  . amLODipine (NORVASC) 5 MG tablet Take 1 tablet (5 mg total) by mouth daily. 30 tablet 3  . amphetamine-dextroamphetamine (ADDERALL) 30 MG tablet Take 1 tablet by mouth daily. 30 tablet 0  . Bromfenac Sodium (PROLENSA) 0.07 % SOLN Place 1 drop into the right eye 3 (three) times daily. Reported on 09/02/2015    . cholecalciferol (VITAMIN D) 1000 units tablet Take 1 tablet (1,000 Units total) by mouth daily. 30 tablet 3  . ciprofloxacin (CIPRO) 500 MG tablet Take 1 tablet by mouth daily. Take for 5  days    . clonazePAM (KLONOPIN) 0.5 MG tablet Take 1 tablet (0.5 mg total) by mouth at bedtime as needed for anxiety. (Patient not taking: Reported on 08/09/2017) 12 tablet 0  . dexamethasone (DECADRON) 2 MG tablet Take 1 tablet (2 mg total) by mouth daily. 60 tablet 2  . Difluprednate (DUREZOL) 0.05 % EMUL Apply 1 drop to eye 4 (four) times daily.    Marland Kitchen lacosamide (VIMPAT) 50 MG TABS tablet Take 1 tablet (50 mg total) by mouth 2 (two) times daily. 60 tablet 3  . LORazepam (ATIVAN) 0.5 MG tablet TAKE 1 TABLET 30 TO 60 MINUTES PRIOR TO MRI. MAY REPEAT ONCE FOR 1 ADDITIONAL RELIEF  0  . Melatonin 5 MG CAPS Take 1-2 capsules by mouth at bedtime as needed (sleep).     . NON FORMULARY Take 1 drop by mouth 2 (two) times daily. Lazarus Natural CBD Oil    . nystatin cream (MYCOSTATIN) Apply 1 application topically as needed.    . ondansetron (ZOFRAN) 8 MG tablet Take 1 tab (8mg ) by mouth 1 hour before daily temozolomide. Take every 8 hours as needed for nausea.    . pantoprazole (PROTONIX) 40 MG tablet Take 1 tablet (40 mg total) by mouth daily. 30 tablet 3  . senna-docusate (SENOKOT-S) 8.6-50 MG tablet Take 1 tablet by mouth at  bedtime as needed for mild constipation.    Marland Kitchen tretinoin (RETIN-A) 0.05 % cream Apply 1 application topically at bedtime as needed. spots  0   No current facility-administered medications on file prior to visit.     Allergies:  Allergies  Allergen Reactions  . Codeine Nausea And Vomiting and Nausea Only  . 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    . Compazine [Prochlorperazine Edisylate] Other (See Comments) and Rash    Became hyper Became hyper   Past Medical History:  Past Medical History:  Diagnosis Date  . Cancer ([place])   . Depression   . GERD (gastroesophageal reflux disease)   . Glial neoplasm of brain ([place]) 12/17/11   left temporal, grade IV, 2.7 x 3.6 x 2.5 cm ring enhancing; numerous small  surrounding satellite lesions 12/13/11  . Headache(784.0)   . Hiatal hernia   . History of radiation therapy 01/07/2012-02/18/12   left temporal  60GY  . HLD (hyperlipidemia)   . PONV (postoperative nausea and vomiting)   . 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 BUNIONECTOMY  2001   right  . CATARACT EXTRACTION Left   . CRANIOTOMY  12/17/2011   Procedure: CRANIOTOMY TUMOR EXCISION;  Surgeon: Otilio Connors, MD;  Location: [facility];  Service: Neurosurgery;  Laterality: Left;  LEFT temporal craniotomy with stealth for tumor resection  . DILATION AND CURETTAGE OF UTERUS  2007  . TOTAL KNEE ARTHROPLASTY  2000   left  . VENTRAL HERNIA REPAIR N/A 09/03/2015   Procedure: LAPAROSCOPIC repair of incarcerated spigelian hernia with mesh, reduction and repair ;  Surgeon: [name], MD;  Location: [facility];  Service: General;  Laterality: N/A;   Social History:  Social History   Socioeconomic History  . Marital status: Married    Spouse name: Not on file  . Number of children: 3  . Years of education: Not on file  . Highest education level: Not on file  Occupational History  . Not on file  Social Needs  . Financial resource strain: Not on file  . Food insecurity:    Worry: Not on file    Inability: Not on file  . Transportation needs:    Medical: Not on file    Non-medical: Not on file  Tobacco Use  . Smoking status: Never Smoker  . Smokeless tobacco: Never Used  Substance and Sexual Activity  . Alcohol use: No  . Drug use: No  . Sexual activity: Yes  Lifestyle  . Physical activity:    Days per week: Not on file    Minutes per session: Not on file  . Stress: Not on file  Relationships  . Social connections:    Talks on phone: Not on file    Gets together: Not on file    Attends religious service: Not on file    Active  member of club or organization: Not on file    Attends meetings of clubs or organizations: Not on file    Relationship status: Not on file  . Intimate partner violence:    Fear of current or ex partner: Not on file    Emotionally abused: Not on file    Physically abused: Not on file    Forced sexual activity: Not on file  Other Topics Concern  .  Not on file  Social History Narrative   2nd marriage of 6 years, previously widowed   Family History:  Family History  Problem Relation Age of Onset  . Heart disease Mother     Review of Systems: Limited by AMS  Physical Exam: Vitals:   08/30/17 1152  BP: (!) 151/78  Pulse: (!) 105  Resp: 17  Temp: 98.7 F (37.1 C)  SpO2: 94%   KPS: 80. General: Cooperative, pleasant, in no acute distress Head: Normal EENT: No conjunctival injection or scleral icterus. Oral mucosa moist Lungs: Resp effort normal Cardiac: Regular rate and rhythm Abdomen: Soft, non-distended abdomen Skin: No rashes cyanosis or petechiae. Extremities: +le edema  Neurologic Exam: Mental Status: Drowsy today, struggles to maintain alertness without some stimulus. Oriented to self and environemnt.  Language is impaired to fluency and comprehension.  Struggles with multiple step commands.  Very poor recall, poor short term memory   Cranial Nerves: Visual acuity is grossly normal. Visual fields are full. Extra-ocular movements intact. Left eye ptosis, chronic. Face is symmetric, tongue midline. Motor: Tone and bulk are normal. Power is full in both arms and legs. Reflexes are symmetric, no pathologic reflexes present. Intact finger to nose bilaterally Sensory: Intact to light touch and temperature Gait: Deferred today   Labs: I have reviewed the data as listed    Component Value Date/Time   NA 140 08/09/2017 1159   NA 140 09/11/2013 1238   K 4.2 08/09/2017 1159   K 4.7 09/11/2013 1238   CL 108 08/09/2017 1159   CL 111 (H) 08/04/2012 1008   CO2 22 08/09/2017  1159   CO2 23 09/11/2013 1238   GLUCOSE 87 08/09/2017 1159   GLUCOSE 87 09/11/2013 1238   GLUCOSE 86 08/04/2012 1008   BUN 15 08/09/2017 1159   BUN 13.3 10/29/2014 1127   CREATININE 0.57 (L) 08/09/2017 1159   CREATININE 0.8 10/29/2014 1127   CALCIUM 8.9 08/09/2017 1159   CALCIUM 9.5 09/11/2013 1238   PROT 6.0 (L) 08/09/2017 1159   PROT 6.6 09/11/2013 1238   ALBUMIN 3.3 (L) 08/09/2017 1159   ALBUMIN 3.9 09/11/2013 1238   AST 29 08/09/2017 1159   AST 19 09/11/2013 1238   ALT 121 (H) 08/09/2017 1159   ALT 27 09/11/2013 1238   ALKPHOS 99 08/09/2017 1159   ALKPHOS 106 09/11/2013 1238   BILITOT 0.9 08/09/2017 1159   BILITOT 0.72 09/11/2013 1238   GFRNONAA >60 08/09/2017 1159   GFRAA >60 08/09/2017 1159   Lab Results  Component Value Date   WBC 9.1 08/09/2017   NEUTROABS 7.8 (H) 08/09/2017   HGB 13.5 08/09/2017   HCT 40.7 08/09/2017   MCV 99.0 08/09/2017   PLT 180 08/09/2017   Imaging:  [facility] Clinician Interpretation: I have personally reviewed the CNS images as listed.  My interpretation, in the context of the patient's clinical presentation, is progressive disease  Mr Kizzie Fantasia Contrast  Result Date: 08/28/2017 CLINICAL DATA:  Gliosarcoma.  Clinical worsening. EXAM: MRI HEAD WITHOUT AND WITH CONTRAST TECHNIQUE: Multiplanar, multiecho pulse sequences of the brain and surrounding structures were obtained without and with intravenous contrast. CONTRAST:  12mL MULTIHANCE GADOBENATE DIMEGLUMINE 529 MG/ML IV SOLN COMPARISON:  Multiple priors, most recent MR 06/19/2017. FINDINGS: Brain: Interval disease progression, with local recurrence at the LEFT lateral temporal lobe surgical bed, extending deep into the insula, anteriorly to the temporal tip, LEFT basal ganglia, and LEFT frontal lobe via the uncinate fasciculus. No dramatic change in the local recurrence,  21 x 13 mm compared to 20 x 12 mm previous. Significant worsening of confluent necrotic tumor adjacent to the frontal horn of the  LEFT lateral ventricle, 13 x 29 mm, previously 9 x 18 mm. Within this region, restricted diffusion is prominent, see for instance series 3, image 80. Vasogenic edema on the LEFT is superimposed upon a bed of extensive post treatment leukoencephalopathy. Generalized atrophy. Minor blood products at the site of surgery. Vascular: Flow voids are maintained. Skull and upper cervical spine: Normal marrow signal. LEFT temporal craniotomy. Sinuses/Orbits: Negative. Other: None. IMPRESSION: Significant disease progression, with interval growth of a large confluent necrotic tumor involving the basal ganglia and periventricular white matter on the LEFT. See discussion above. Electronically Signed   By: Staci Righter M.D.   On: 08/28/2017 09:36    Assessment/Plan 1. 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  Her memory is notably impaired today.  There is no longer utility in continuing Avastin infusions.   We discussed options moving forward including second/outside opinion, trial of Optune, and palliative/hospice referral.  Because of poor functional status, salvage chemotherapy was not recommended.  It is also unlikely she would qualify for clinical trials for this reason.   Because more time is needed to process these decisions, they will communicate their preference with [place] some time next week.  We appreciate the opportunity to participate in the care of Kelly Maxwell.   All questions were answered. The patient knows to call the clinic with any problems, questions or concerns. No barriers to learning were detected.  The total time spent in the encounter was 40 minutes and more than 50% was on counseling and review of test results   Ventura Sellers, MD Medical Director of Neuro-Oncology [facility] at [place] 08/30/17 11:46 AM

## 2017-09-02 ENCOUNTER — Telehealth: Payer: Self-pay

## 2017-09-02 ENCOUNTER — Telehealth: Payer: Self-pay | Admitting: *Deleted

## 2017-09-02 NOTE — Telephone Encounter (Signed)
Spoke with susan O.T. at [facility]. Given order for O.T. 1 x wk for 3 weeks and 2 x wk for 2 weeks, per dr Mickeal Skinner

## 2017-09-02 NOTE — Telephone Encounter (Signed)
Per 7/1 no los 

## 2017-09-03 DIAGNOSIS — C712 Malignant neoplasm of temporal lobe: Secondary | ICD-10-CM | POA: Diagnosis not present

## 2017-09-03 DIAGNOSIS — R2689 Other abnormalities of gait and mobility: Secondary | ICD-10-CM | POA: Diagnosis not present

## 2017-09-04 ENCOUNTER — Other Ambulatory Visit: Payer: Self-pay | Admitting: Internal Medicine

## 2017-09-04 DIAGNOSIS — R2689 Other abnormalities of gait and mobility: Secondary | ICD-10-CM | POA: Diagnosis not present

## 2017-09-04 DIAGNOSIS — C719 Malignant neoplasm of brain, unspecified: Secondary | ICD-10-CM

## 2017-09-04 DIAGNOSIS — C712 Malignant neoplasm of temporal lobe: Secondary | ICD-10-CM | POA: Diagnosis not present

## 2017-09-04 MED ORDER — AMPHETAMINE-DEXTROAMPHETAMINE 30 MG PO TABS: 30.0000 mg | ORAL_TABLET | Freq: Every day | ORAL | 0 refills | Status: DC

## 2017-09-06 ENCOUNTER — Telehealth: Payer: Self-pay | Admitting: *Deleted

## 2017-09-06 DIAGNOSIS — L89301 Pressure ulcer of unspecified buttock, stage 1: Secondary | ICD-10-CM | POA: Diagnosis not present

## 2017-09-06 DIAGNOSIS — L22 Diaper dermatitis: Secondary | ICD-10-CM | POA: Diagnosis not present

## 2017-09-06 DIAGNOSIS — R3915 Urgency of urination: Secondary | ICD-10-CM | POA: Diagnosis not present

## 2017-09-06 DIAGNOSIS — E876 Hypokalemia: Secondary | ICD-10-CM | POA: Diagnosis not present

## 2017-09-06 DIAGNOSIS — R8290 Unspecified abnormal findings in urine: Secondary | ICD-10-CM | POA: Diagnosis not present

## 2017-09-06 DIAGNOSIS — L89021 Pressure ulcer of left elbow, stage 1: Secondary | ICD-10-CM | POA: Diagnosis not present

## 2017-09-06 DIAGNOSIS — B372 Candidiasis of skin and nail: Secondary | ICD-10-CM | POA: Diagnosis not present

## 2017-09-06 DIAGNOSIS — I499 Cardiac arrhythmia, unspecified: Secondary | ICD-10-CM | POA: Diagnosis not present

## 2017-09-06 NOTE — Telephone Encounter (Signed)
Faxed ROI to [employer], Gilda Crease MD; release [phone]

## 2017-09-09 ENCOUNTER — Telehealth: Payer: Self-pay | Admitting: *Deleted

## 2017-09-09 ENCOUNTER — Telehealth: Payer: Self-pay | Admitting: Internal Medicine

## 2017-09-09 DIAGNOSIS — C712 Malignant neoplasm of temporal lobe: Secondary | ICD-10-CM | POA: Diagnosis not present

## 2017-09-09 DIAGNOSIS — R2689 Other abnormalities of gait and mobility: Secondary | ICD-10-CM | POA: Diagnosis not present

## 2017-09-09 NOTE — Telephone Encounter (Signed)
[facility] called requesting permission to change brand for generic adderall per the daughters request.  Permission given since initial request was at the hands of the daughter.

## 2017-09-09 NOTE — Telephone Encounter (Signed)
Faxed medical records to [facility] at [phone] on 09/09/17, Release ID: [id number]

## 2017-09-10 ENCOUNTER — Telehealth: Payer: Self-pay

## 2017-09-10 ENCOUNTER — Telehealth: Payer: Self-pay | Admitting: *Deleted

## 2017-09-10 DIAGNOSIS — L089 Local infection of the skin and subcutaneous tissue, unspecified: Secondary | ICD-10-CM | POA: Diagnosis not present

## 2017-09-10 DIAGNOSIS — C719 Malignant neoplasm of brain, unspecified: Secondary | ICD-10-CM | POA: Diagnosis not present

## 2017-09-10 DIAGNOSIS — L8990 Pressure ulcer of unspecified site, unspecified stage: Secondary | ICD-10-CM | POA: Diagnosis not present

## 2017-09-10 DIAGNOSIS — K1379 Other lesions of oral mucosa: Secondary | ICD-10-CM | POA: Diagnosis not present

## 2017-09-10 DIAGNOSIS — K219 Gastro-esophageal reflux disease without esophagitis: Secondary | ICD-10-CM | POA: Diagnosis not present

## 2017-09-10 NOTE — Telephone Encounter (Signed)
[place], PT sent request for approval for orders.  [facility] OT sent approval for therapy for once a week for 3 weeks  [place], MSW social workers services to start on 09/04/2017 for assessment of social and emotional factors, long range planning and decision making for patient and family.  All orders signed and returned via fax

## 2017-09-10 NOTE — Telephone Encounter (Signed)
Phone call placed to patient's husband to offer to schedule visit with Palliative Care. Scheduled for Friday 09/13/17

## 2017-09-11 DIAGNOSIS — R2689 Other abnormalities of gait and mobility: Secondary | ICD-10-CM | POA: Diagnosis not present

## 2017-09-11 DIAGNOSIS — C712 Malignant neoplasm of temporal lobe: Secondary | ICD-10-CM | POA: Diagnosis not present

## 2017-09-12 DIAGNOSIS — C719 Malignant neoplasm of brain, unspecified: Secondary | ICD-10-CM | POA: Diagnosis not present

## 2017-09-12 DIAGNOSIS — C718 Malignant neoplasm of overlapping sites of brain: Secondary | ICD-10-CM | POA: Diagnosis not present

## 2017-09-13 ENCOUNTER — Other Ambulatory Visit: Payer: Medicare HMO | Admitting: Internal Medicine

## 2017-09-13 ENCOUNTER — Telehealth: Payer: Self-pay

## 2017-09-13 DIAGNOSIS — R2689 Other abnormalities of gait and mobility: Secondary | ICD-10-CM | POA: Diagnosis not present

## 2017-09-13 DIAGNOSIS — R2681 Unsteadiness on feet: Secondary | ICD-10-CM

## 2017-09-13 DIAGNOSIS — C712 Malignant neoplasm of temporal lobe: Secondary | ICD-10-CM | POA: Diagnosis not present

## 2017-09-13 DIAGNOSIS — C719 Malignant neoplasm of brain, unspecified: Secondary | ICD-10-CM

## 2017-09-13 DIAGNOSIS — R531 Weakness: Secondary | ICD-10-CM

## 2017-09-13 NOTE — Telephone Encounter (Signed)
Pt daughter, [name], called requesting fax number for Dr. Mickeal Skinner. Called [phone] as instructed. LVM with fax number [phone].

## 2017-09-16 DIAGNOSIS — C712 Malignant neoplasm of temporal lobe: Secondary | ICD-10-CM | POA: Diagnosis not present

## 2017-09-16 DIAGNOSIS — R2689 Other abnormalities of gait and mobility: Secondary | ICD-10-CM | POA: Diagnosis not present

## 2017-09-17 ENCOUNTER — Telehealth: Payer: Self-pay | Admitting: *Deleted

## 2017-09-17 NOTE — Telephone Encounter (Signed)
Received request from Herbert Deaner @ [facility] [phone] Physical Therapist for 4 more weeks with 1 session per week for functional mobility and decreasing fall risks.  Approved.

## 2017-09-18 ENCOUNTER — Other Ambulatory Visit: Payer: Medicare HMO | Admitting: Internal Medicine

## 2017-09-18 DIAGNOSIS — R269 Unspecified abnormalities of gait and mobility: Secondary | ICD-10-CM | POA: Diagnosis not present

## 2017-09-18 DIAGNOSIS — R531 Weakness: Secondary | ICD-10-CM

## 2017-09-18 DIAGNOSIS — R2681 Unsteadiness on feet: Secondary | ICD-10-CM

## 2017-09-18 DIAGNOSIS — C712 Malignant neoplasm of temporal lobe: Secondary | ICD-10-CM | POA: Diagnosis not present

## 2017-09-18 DIAGNOSIS — K436 Other and unspecified ventral hernia with obstruction, without gangrene: Secondary | ICD-10-CM | POA: Diagnosis not present

## 2017-09-18 DIAGNOSIS — Z9181 History of falling: Secondary | ICD-10-CM | POA: Diagnosis not present

## 2017-09-18 DIAGNOSIS — R2689 Other abnormalities of gait and mobility: Secondary | ICD-10-CM | POA: Diagnosis not present

## 2017-09-18 DIAGNOSIS — Z7189 Other specified counseling: Secondary | ICD-10-CM

## 2017-09-18 DIAGNOSIS — M6281 Muscle weakness (generalized): Secondary | ICD-10-CM | POA: Diagnosis not present

## 2017-09-18 DIAGNOSIS — C719 Malignant neoplasm of brain, unspecified: Secondary | ICD-10-CM

## 2017-09-18 NOTE — Progress Notes (Signed)
PALLIATIVE CARE CONSULT VISIT   PATIENT NAME: Kelly Maxwell DOB: [birth year] MRN: [id number]  PRIMARY CARE PROVIDER:   Dr. Gae Gallop PROVIDER:  Dr. Mickeal Skinner  RESPONSIBLE PARTY:   [name] (daughter),  Shanon Brow Habig(husband) [phone] cell, [phone] hm        RECOMMENDATIONS and PLAN:  1.Gerneralized weakness  R 53.1:  Related to progressive brain cancer.  Assist with all activities.  She would benefit from continuation of in home aid prior to transition to Hospice services.  Communication with [facility] to propose extending CNA services.  2. Unsteady gait  R56.81: Debilitation due to brain cancer.  Using walker.  Complete PT and instruct on transfers.  Consider in home use of wheelchair  3.  Gliosarcoma- L temporal lobe  C71.9:  Managed by Oncology and Neuro Oncology.  No current treatment.  Continue preventative seizure therapy.  Dexamethasone could be increased in the future if patient exhibits signs of increased intracranial pressure.  Monitor.  4.  Advanced care planning:  Lengthy discussion with family members related to code status with noted differences of opinions between children and husband.  At this time, pt. remains a FULL CODE.  MOST form introduced.  Encouraged joint family discussion.  Plan on family meeting on 7/17 to fully address goals of care and advanced care planning.  Palliative care will continue to follow.  Appropriate for Hospice care after completion of all therapies.    I spent 60 minutes providing this consultation,  from 2:30pm to 3:30pm. More than 50% of the time in this consultation was spent coordinating communication with patient, husband, son and daughter(via phone).   HISTORY OF PRESENT ILLNESS:  Kelly Maxwell is a 72 y.o. year old female with multiple medical problems including Grade IV multifocal gliosarcoma, GERD and HLD. She was originally diagnosed with brain cancer in 2013 and is post resection, radiation and chemotherapy treatments.  Husband  reports progression of cancer informed during appt on 09/12/17 at [facility] cancer center.  No current chemotherapy agents being used and terminal diagnosis given.  Family reports pt is weak and is receiving home PT.  CNA services have ended. Pt. Is ambulatory only with assistance and walker use.  Reports on one fall in the home without injury.  She requires total care.  She is able to feed self finger foods with cuing.  Weight has been stable at 147#.  Reports of increased sleeping during the day and sleeps well at night.  No complaints of pain.   Palliative Care was asked to help address goals of care.   CODE STATUS: FULL CODE  PPS:  40% with total care  HOSPICE ELIGIBILITY/DIAGNOSIS: YES/ Gliosarcoma not responding to chemotherapy/Pt. Receiving in home PT  PAST MEDICAL HISTORY:  Past Medical History:  Diagnosis Date  . Cancer ([place])   . Depression   . GERD (gastroesophageal reflux disease)   . Glial neoplasm of brain ([place]) 12/17/11   left temporal, grade IV, 2.7 x 3.6 x 2.5 cm ring enhancing; numerous small surrounding satellite lesions 12/13/11  . Headache(784.0)   . Hiatal hernia   . History of radiation therapy 01/07/2012-02/18/12   left temporal  60GY  . HLD (hyperlipidemia)   . PONV (postoperative nausea and vomiting)   . 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 Smoking status: Never Smoker  . Smokeless tobacco: Never Used  Substance Use Topics  . Alcohol use: No    ALLERGIES:  Allergies  Allergen Reactions  . Codeine Nausea And Vomiting and Nausea Only  . 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    . Compazine [Prochlorperazine Edisylate] Other (See Comments) and Rash    Became hyper Became hyper     PERTINENT MEDICATIONS:  Outpatient Encounter Medications as of 09/13/2017  Medication  Sig  . amLODipine (NORVASC) 5 MG tablet Take 1 tablet (5 mg total) by mouth daily.  Marland Kitchen amphetamine-dextroamphetamine (ADDERALL) 30 MG tablet Take 1 tablet by mouth daily.  . Bromfenac Sodium (PROLENSA) 0.07 % SOLN Place 1 drop into the right eye 3 (three) times daily. Reported on 09/02/2015  . cholecalciferol (VITAMIN D) 1000 units tablet Take 1 tablet (1,000 Units total) by mouth daily.  . ciprofloxacin (CIPRO) 500 MG tablet Take 1 tablet by mouth daily. Take for 5 days  . clonazePAM (KLONOPIN) 0.5 MG tablet Take 1 tablet (0.5 mg total) by mouth at bedtime as needed for anxiety. (Patient not taking: Reported on 08/09/2017)  . dexamethasone (DECADRON) 2 MG tablet Take 1 tablet (2 mg total) by mouth daily.  . Difluprednate (DUREZOL) 0.05 % EMUL Apply 1 drop to eye 4 (four) times daily.  Marland Kitchen lacosamide (VIMPAT) 50 MG TABS tablet Take 1 tablet (50 mg total) by mouth 2 (two) times daily.  [name] (ATIVAN) 0.5 MG tablet TAKE 1 TABLET 30 TO 60 MINUTES PRIOR TO MRI. MAY REPEAT ONCE FOR 1 ADDITIONAL RELIEF  . Melatonin 5 MG CAPS Take 1-2 capsules by mouth at bedtime as needed (sleep).   . NON FORMULARY Take 1 drop by mouth 2 (two) times daily. Lazarus Natural CBD Oil  . nystatin cream (MYCOSTATIN) Apply 1 application topically as needed.  . ondansetron (ZOFRAN) 8 MG tablet Take 1 tab (8mg ) by mouth 1 hour before daily temozolomide. Take every 8 hours as needed for nausea.  . pantoprazole (PROTONIX) 40 MG tablet Take 1 tablet (40 mg total) by mouth daily.  Marland Kitchen senna-docusate (SENOKOT-S) 8.6-50 MG tablet Take 1 tablet by mouth at bedtime as needed for mild constipation.  Marland Kitchen tretinoin (RETIN-A) 0.05 % cream Apply 1 application topically at bedtime as needed. spots   No facility-administered encounter medications on file as of 09/13/2017.     PHYSICAL EXAM:   BP 128/80 P101 R20  Sats 96% rm air  General: Chronically ill appearing elderly female sitting in chair EENT:  Pupils are dilated and  equal Cardiovascular: regular rate and rhythm Pulmonary: clear ant fields Abdomen: soft, nontender, + bowel sounds GU: no suprapubic tenderness Extremities: no edema Skin: exposed skin is intact Neurological: Alert but falls asleep easily. Oriented x 2.  Generalized weakness.  Word searching/expressive aphasia.  Speaks in brief sentences or nods.  Gonzella Lex, NP-C

## 2017-09-19 ENCOUNTER — Encounter: Payer: Self-pay | Admitting: Internal Medicine

## 2017-09-19 ENCOUNTER — Inpatient Hospital Stay: Payer: Medicare HMO | Attending: Internal Medicine | Admitting: Internal Medicine

## 2017-09-19 ENCOUNTER — Other Ambulatory Visit: Payer: Self-pay

## 2017-09-19 VITALS — BP 131/90 | HR 99 | Temp 98.2°F | Resp 18 | Ht 65.0 in | Wt 145.7 lb

## 2017-09-19 DIAGNOSIS — R530 Neoplastic (malignant) related fatigue: Secondary | ICD-10-CM | POA: Diagnosis not present

## 2017-09-19 DIAGNOSIS — R2681 Unsteadiness on feet: Secondary | ICD-10-CM | POA: Insufficient documentation

## 2017-09-19 DIAGNOSIS — Z79899 Other long term (current) drug therapy: Secondary | ICD-10-CM | POA: Diagnosis not present

## 2017-09-19 DIAGNOSIS — E785 Hyperlipidemia, unspecified: Secondary | ICD-10-CM | POA: Diagnosis not present

## 2017-09-19 DIAGNOSIS — Z923 Personal history of irradiation: Secondary | ICD-10-CM | POA: Insufficient documentation

## 2017-09-19 DIAGNOSIS — R600 Localized edema: Secondary | ICD-10-CM | POA: Diagnosis not present

## 2017-09-19 DIAGNOSIS — K219 Gastro-esophageal reflux disease without esophagitis: Secondary | ICD-10-CM | POA: Insufficient documentation

## 2017-09-19 DIAGNOSIS — C712 Malignant neoplasm of temporal lobe: Secondary | ICD-10-CM | POA: Diagnosis not present

## 2017-09-19 DIAGNOSIS — R531 Weakness: Secondary | ICD-10-CM | POA: Insufficient documentation

## 2017-09-19 DIAGNOSIS — Z9221 Personal history of antineoplastic chemotherapy: Secondary | ICD-10-CM | POA: Insufficient documentation

## 2017-09-19 DIAGNOSIS — Z7189 Other specified counseling: Secondary | ICD-10-CM | POA: Insufficient documentation

## 2017-09-19 DIAGNOSIS — C719 Malignant neoplasm of brain, unspecified: Secondary | ICD-10-CM

## 2017-09-19 MED ORDER — AMPHETAMINE-DEXTROAMPHETAMINE 20 MG PO TABS: 20.0000 mg | ORAL_TABLET | Freq: Two times a day (BID) | ORAL | 0 refills | Status: DC

## 2017-09-19 NOTE — Progress Notes (Signed)
[facility] [place], [place] [address] [phone]   Interval Evaluation  Date of Service: 09/19/17 Patient Name: Kelly Maxwell Patient MRN: [id number] Patient DOB: 1945/09/30 Provider: Ventura Sellers, MD  Identifying Statement:  Kelly Maxwell is a 72 y.o. female with left temporal glioblastoma   Oncologic History:   Gliosarcoma - left temporal lobe s/p Tx 2013    Initial Diagnosis    Gliosarcoma - left temporal lobe s/p Tx 2013      12/17/2011 Surgery    Craniotomy, resection at [facility].  Path demonstrates Gliosarcoma       - 02/18/2012 Radiation Therapy    Completes RT and concurrent Temodar with Dr. Tammi Klippel       - 09/14/2013 Chemotherapy    Completes 18 cycles of adjuvant Temodar, treated at [place].       09/29/2015 Progression    Progression #1.  Starts metronomic temodar 50mg /m2 daily       - 09/26/2016 Chemotherapy    Completes 1 year metronomic temodar       04/19/2017 Progression    Progression #2.  Recommendation for Avastin 10mg /kg q2 weeks       Interval History:  Kelly Maxwell presents today for follow up after recent visit with [facility].  Energy and communication has improved mildly from previous visit.  She is ambulating with a walker short distances.  Otherwise no new or progressive neurologic deficits.   Medications: Current Outpatient Medications on File Prior to Visit  Medication Sig Dispense Refill  . amLODipine (NORVASC) 5 MG tablet Take 1 tablet (5 mg total) by mouth daily. 30 tablet 3  . amphetamine-dextroamphetamine (ADDERALL) 30 MG tablet Take 1 tablet by mouth daily. 30 tablet 0  . Bromfenac Sodium (PROLENSA) 0.07 % SOLN Place 1 drop into the right eye 3 (three) times daily. Reported on 09/02/2015    . cholecalciferol (VITAMIN D) 1000 units tablet Take 1 tablet (1,000 Units total) by mouth daily. 30 tablet 3  . ciprofloxacin (CIPRO) 500 MG tablet Take 1 tablet by mouth daily. Take  for 5 days    . clonazePAM (KLONOPIN) 0.5 MG tablet Take 1 tablet (0.5 mg total) by mouth at bedtime as needed for anxiety. (Patient not taking: Reported on 08/09/2017) 12 tablet 0  . dexamethasone (DECADRON) 2 MG tablet Take 1 tablet (2 mg total) by mouth daily. 60 tablet 2  . Difluprednate (DUREZOL) 0.05 % EMUL Apply 1 drop to eye 4 (four) times daily.    Marland Kitchen lacosamide (VIMPAT) 50 MG TABS tablet Take 1 tablet (50 mg total) by mouth 2 (two) times daily. 60 tablet 3  . LORazepam (ATIVAN) 0.5 MG tablet TAKE 1 TABLET 30 TO 60 MINUTES PRIOR TO MRI. MAY REPEAT ONCE FOR 1 ADDITIONAL RELIEF  0  . Melatonin 5 MG CAPS Take 1-2 capsules by mouth at bedtime as needed (sleep).     . NON FORMULARY Take 1 drop by mouth 2 (two) times daily. Lazarus Natural CBD Oil    . nystatin cream (MYCOSTATIN) Apply 1 application topically as needed.    . ondansetron (ZOFRAN) 8 MG tablet Take 1 tab (8mg ) by mouth 1 hour before daily temozolomide. Take every 8 hours as needed for nausea.    . pantoprazole (PROTONIX) 40 MG tablet Take 1 tablet (40 mg total) by mouth daily. 30 tablet 3  . senna-docusate (SENOKOT-S) 8.6-50 MG tablet Take 1 tablet by mouth at bedtime as  needed for mild constipation.    Marland Kitchen tretinoin (RETIN-A) 0.05 % cream Apply 1 application topically at bedtime as needed. spots  0   No current facility-administered medications on file prior to visit.     Allergies:  Allergies  Allergen Reactions  . Codeine Nausea And Vomiting and Nausea Only  . 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    . Compazine [Prochlorperazine Edisylate] Other (See Comments) and Rash    Became hyper Became hyper   Past Medical History:  Past Medical History:  Diagnosis Date  . Cancer ([place])   . Depression   . GERD (gastroesophageal reflux disease)   . Glial neoplasm of brain ([place]) 12/17/11   left temporal, grade IV, 2.7 x 3.6 x 2.5 cm ring enhancing; numerous  small surrounding satellite lesions 12/13/11  . Headache(784.0)   . Hiatal hernia   . History of radiation therapy 01/07/2012-02/18/12   left temporal  60GY  . HLD (hyperlipidemia)   . PONV (postoperative nausea and vomiting)   . 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 BUNIONECTOMY  2001   right  . CATARACT EXTRACTION Left   . CRANIOTOMY  12/17/2011   Procedure: CRANIOTOMY TUMOR EXCISION;  Surgeon: Otilio Connors, MD;  Location: [facility];  Service: Neurosurgery;  Laterality: Left;  LEFT temporal craniotomy with stealth for tumor resection  . DILATION AND CURETTAGE OF UTERUS  2007  . TOTAL KNEE ARTHROPLASTY  2000   left  . VENTRAL HERNIA REPAIR N/A 09/03/2015   Procedure: LAPAROSCOPIC repair of incarcerated spigelian hernia with mesh, reduction and repair ;  Surgeon: [name], MD;  Location: [facility];  Service: General;  Laterality: N/A;   Social History:  Social History   Socioeconomic History  . Marital status: Married    Spouse name: Not on file  . Number of children: 3  . Years of education: Not on file  . Highest education level: Not on file  Occupational History  . Not on file  Social Needs  . Financial resource strain: Not on file  . Food insecurity:    Worry: Not on file    Inability: Not on file  . Transportation needs:    Medical: Not on file    Non-medical: Not on file  Tobacco Use  . Smoking status: Never Smoker  . Smokeless tobacco: Never Used  Substance and Sexual Activity  . Alcohol use: No  . Drug use: No  . Sexual activity: Yes  Lifestyle  . Physical activity:    Days per week: Not on file    Minutes per session: Not on file  . Stress: Not on file  Relationships  . Social connections:    Talks on phone: Not on file    Gets together: Not on file    Attends religious service: Not on file     Active member of club or organization: Not on file    Attends meetings of clubs or organizations: Not on file    Relationship status: Not on file  . Intimate partner violence:    Fear of current or ex partner: Not on file    Emotionally abused: Not on file    Physically abused: Not on file    Forced sexual activity: Not on file  Other Topics Concern  . Not on  file  Social History Narrative   2nd marriage of 6 years, previously widowed   Family History:  Family History  Problem Relation Age of Onset  . Heart disease Mother     Review of Systems: Limited by AMS  Physical Exam: Vitals:   09/19/17 1125  BP: 131/90  Pulse: 99  Resp: 18  Temp: 98.2 F (36.8 C)  SpO2: 97%   KPS: 60. General: Cooperative, pleasant, in no acute distress Head: Normal EENT: No conjunctival injection or scleral icterus. Oral mucosa moist Lungs: Resp effort normal Cardiac: Regular rate and rhythm Abdomen: Soft, non-distended abdomen Skin: No rashes cyanosis or petechiae. Extremities: +le edema  Neurologic Exam: Mental Status: Alert, not attentive. Oriented to self and environemnt.  Language is impaired to fluency and comprehension.  Struggles with multiple step commands.  Very poor recall, poor short term memory   Cranial Nerves: Visual acuity is grossly normal. Visual fields are full. Extra-ocular movements intact. Left eye ptosis, chronic. Face is symmetric, tongue midline. Motor: Tone and bulk are normal. Power is full in both arms and legs. Reflexes are symmetric, no pathologic reflexes present. Intact finger to nose bilaterally Sensory: Intact to light touch and temperature Gait: Deferred today   Labs: I have reviewed the data as listed    Component Value Date/Time   NA 140 08/09/2017 1159   NA 140 09/11/2013 1238   K 4.2 08/09/2017 1159   K 4.7 09/11/2013 1238   CL 108 08/09/2017 1159   CL 111 (H) 08/04/2012 1008   CO2 22 08/09/2017 1159   CO2 23 09/11/2013 1238   GLUCOSE 87  08/09/2017 1159   GLUCOSE 87 09/11/2013 1238   GLUCOSE 86 08/04/2012 1008   BUN 15 08/09/2017 1159   BUN 13.3 10/29/2014 1127   CREATININE 0.57 (L) 08/09/2017 1159   CREATININE 0.8 10/29/2014 1127   CALCIUM 8.9 08/09/2017 1159   CALCIUM 9.5 09/11/2013 1238   PROT 6.0 (L) 08/09/2017 1159   PROT 6.6 09/11/2013 1238   ALBUMIN 3.3 (L) 08/09/2017 1159   ALBUMIN 3.9 09/11/2013 1238   AST 29 08/09/2017 1159   AST 19 09/11/2013 1238   ALT 121 (H) 08/09/2017 1159   ALT 27 09/11/2013 1238   ALKPHOS 99 08/09/2017 1159   ALKPHOS 106 09/11/2013 1238   BILITOT 0.9 08/09/2017 1159   BILITOT 0.72 09/11/2013 1238   GFRNONAA >60 08/09/2017 1159   GFRAA >60 08/09/2017 1159   Lab Results  Component Value Date   WBC 9.1 08/09/2017   NEUTROABS 7.8 (H) 08/09/2017   HGB 13.5 08/09/2017   HCT 40.7 08/09/2017   MCV 99.0 08/09/2017   PLT 180 08/09/2017    Assessment/Plan 1. 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  We discussed recommendations given at [facility] including consideration for CCNU montherapy dosed at 90mg /m2 q6 weeks.  We discussed risks and potential limited benefits of starting additional chemotherapy given her poor functional status.  They will continue to weigh this option and contact us if they desire to move forward with treatment.  For persistent cancer related fatigue, Adderall can be increased to 20mg  BID.    We appreciate the opportunity to participate in the care of Kelly Maxwell.   All questions were answered. The patient knows to call the clinic with any problems, questions or concerns. No barriers to learning were detected.  The total time spent in the encounter was 40 minutes and more than 50% was on  counseling and review of test results   Ventura Sellers, MD Medical Director of Neuro-Oncology [facility] at [place] 09/19/17 11:06 AM

## 2017-09-19 NOTE — Progress Notes (Signed)
Family left completed MOST form here at office.   Responses:   CPR YES Full Scope of Treatment YES Determine use of limitation of antibiotics when infection occurs YES IV fluids for a defined trail period YES Feeding tube for a defined trail period YES Discussed with patient spouse and children.  Signed off by Gonzella Lex, NP-C  Notified family to come get form and to take with them with patient at all times for it to be honored.  Chart noted of wishes.  Scanned to records.

## 2017-09-19 NOTE — Progress Notes (Signed)
PALLIATIVE CARE CONSULT VISIT   PATIENT NAME: Kelly Maxwell DOB: 1946/01/18 MRN: [id number]  PRIMARY CARE PROVIDER:   Dr. Gae Gallop PROVIDER:  Dr. Mickeal Skinner  RESPONSIBLE PARTY:   [name] (daughter),  Shanon Brow Abbett(husband) [phone] cell, [phone] hm        RECOMMENDATIONS and PLAN:  1.Generalized weakness  R 53.1: Chronic state.  Increased daytime sleeping. Provide 24 hour assistance with all ADLs.  Improve hydration.  Small, frequent healthy meals/snacks. Complete PT  2. Unsteady gait  R56.81: Unchanged.  Multifactorial.  Gait and transfer training by PT.  Fall prevention explained to family.    3.  Gliosarcoma- L temporal lobe  C71.9:  Pending follow-up with Neuro oncologist. No reports of seizure activity. Family is hopeful for additional treatment.  Monitor.  4.  Advanced care planning Z71.889:  Lengthy discussion with daughter, son and husband.  Palliative and Hospice care explained.  Advanced care planning/MOST form completion.  Pt. Is a full code with full scope of treatment including intubation.  Antibiotic and IV therapy if indicated.  Feeding tube for a trial period was also selected by patient. POA and HCPOA documents in home.  Living will not completed. Encouraged family to assist with completion of business affairs.  Goals of care:  Family desires to provide patient care at home and would like for patient to receive additional treatment of brain cancer.  Patient is unable to express her specific desires other than "she wants to feel better". Appropriate for Hospice care after completion of all therapies.  Palliative care will continue to follow until transition.  I spent 75 minutes providing this consultation,  from 12:30pm to 1:45pm. More than 50% of the time in this consultation was spent coordinating communication with patient, husband, son and daughter.  HISTORY OF PRESENT ILLNESS:  Kelly Maxwell is a 72 y.o. year old female with multiple medical problems  including Grade IV multifocal gliosarcoma, GERD and HLD. She was originally diagnosed with brain cancer in 2013 and is post resection, radiation and chemotherapy treatments.  Husband reports progression of cancer informed during appt on 09/12/17 at [facility] cancer center.  No current chemotherapy agents being used and terminal diagnosis given.  Family reports pt is weak and is receiving home PT.  CNA services have ended. Pt. Is ambulatory only with assistance and walker use.  Reports on one fall in the home without injury.  She requires total care.  She is able to feed self finger foods with cuing.  Weight has been stable at 147#.  Reports of increased sleeping during the day and sleeps well at night.  No complaints of pain.   Palliative Care was asked to help address goals of care.   CODE STATUS: FULL CODE/FULL SCOPE OF TREATMENT  PPS:  40% with total care  HOSPICE ELIGIBILITY/DIAGNOSIS: YES/ Gliosarcoma not responding to chemotherapy/Pt. Receiving in home PT  PAST MEDICAL HISTORY:  Past Medical History:  Diagnosis Date  . Cancer ([place])   . Depression   . GERD (gastroesophageal reflux disease)   . Glial neoplasm of brain ([place]) 12/17/11   left temporal, grade IV, 2.7 x 3.6 x 2.5 cm ring enhancing; numerous small surrounding satellite lesions 12/13/11  . Headache(784.0)   . Hiatal hernia   . History of radiation therapy 01/07/2012-02/18/12   left temporal  60GY  . HLD (hyperlipidemia)   . PONV (postoperative nausea and vomiting)   . 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 Smoking status: Never Smoker  . Smokeless tobacco: Never Used  Substance Use Topics  . Alcohol use: No    ALLERGIES:  Allergies  Allergen Reactions  . Codeine Nausea And Vomiting and Nausea Only  . Succinylcholine Chloride Other (See Comments)    Pseudocholinesterase Deficiency - Required post-op  ventilation.    . Compazine [Prochlorperazine Edisylate] Other (See Comments) and Rash    Became hyper Became hyper     PERTINENT MEDICATIONS:  Outpatient Encounter Medications as of 09/13/2017  Medication Sig  . amLODipine (NORVASC) 5 MG tablet Take 1 tablet (5 mg total) by mouth daily.  Marland Kitchen amphetamine-dextroamphetamine (ADDERALL) 30 MG tablet Take 1 tablet by mouth daily.  . Bromfenac Sodium (PROLENSA) 0.07 % SOLN Place 1 drop into the right eye 3 (three) times daily. Reported on 09/02/2015  . cholecalciferol (VITAMIN D) 1000 units tablet Take 1 tablet (1,000 Units total) by mouth daily.  . ciprofloxacin (CIPRO) 500 MG tablet Take 1 tablet by mouth daily. Take for 5 days  . clonazePAM (KLONOPIN) 0.5 MG tablet Take 1 tablet (0.5 mg total) by mouth at bedtime as needed for anxiety. (Patient not taking: Reported on 08/09/2017)  . dexamethasone (DECADRON) 2 MG tablet Take 1 tablet (2 mg total) by mouth daily.  . Difluprednate (DUREZOL) 0.05 % EMUL Apply 1 drop to eye 4 (four) times daily.  Marland Kitchen lacosamide (VIMPAT) 50 MG TABS tablet Take 1 tablet (50 mg total) by mouth 2 (two) times daily.  [name] (ATIVAN) 0.5 MG tablet TAKE 1 TABLET 30 TO 60 MINUTES PRIOR TO MRI. MAY REPEAT ONCE FOR 1 ADDITIONAL RELIEF  . Melatonin 5 MG CAPS Take 1-2 capsules by mouth at bedtime as needed (sleep).   . NON FORMULARY Take 1 drop by mouth 2 (two) times daily. Lazarus Natural CBD Oil  . nystatin cream (MYCOSTATIN) Apply 1 application topically as needed.  . ondansetron (ZOFRAN) 8 MG tablet Take 1 tab (8mg ) by mouth 1 hour before daily temozolomide. Take every 8 hours as needed for nausea.  . pantoprazole (PROTONIX) 40 MG tablet Take 1 tablet (40 mg total) by mouth daily.  Marland Kitchen senna-docusate (SENOKOT-S) 8.6-50 MG tablet Take 1 tablet by mouth at bedtime as needed for mild constipation.  Marland Kitchen tretinoin (RETIN-A) 0.05 % cream Apply 1 application topically at bedtime as needed. spots   No facility-administered encounter  medications on file as of 09/13/2017.     PHYSICAL EXAM:   BP 110/70 P98 R22  Sats 93% rm air  General: Chronically ill appearing elderly female sitting in chair.  Grimacing when awake.  Denies pain.  "i'm very tired". EENT:  Pupils are dilated and equal.  Dry mucus membranes Cardiovascular: regular rate and rhythm Pulmonary: clear in all lung fields Abdomen: soft, nontender, + bowel sounds Extremities: no edema Skin: exposed skin is intact.  Decreased skin turgor Neurological: Somnolent with forward head tilt . Arouses easily. Oriented to person and place.  Unclear on year. Generalized weakness.  Word searching.  Speaks in brief sentences or nods.  Gonzella Lex, NP-C

## 2017-09-20 ENCOUNTER — Telehealth: Payer: Self-pay

## 2017-09-20 DIAGNOSIS — R2689 Other abnormalities of gait and mobility: Secondary | ICD-10-CM | POA: Diagnosis not present

## 2017-09-20 DIAGNOSIS — C712 Malignant neoplasm of temporal lobe: Secondary | ICD-10-CM | POA: Diagnosis not present

## 2017-09-20 NOTE — Telephone Encounter (Signed)
Per 7/19 no los

## 2017-09-21 DIAGNOSIS — N3944 Nocturnal enuresis: Secondary | ICD-10-CM | POA: Diagnosis not present

## 2017-09-23 ENCOUNTER — Telehealth: Payer: Self-pay | Admitting: *Deleted

## 2017-09-23 ENCOUNTER — Other Ambulatory Visit: Payer: Self-pay | Admitting: Internal Medicine

## 2017-09-23 MED ORDER — AMPHETAMINE-DEXTROAMPHETAMINE 30 MG PO TABS: 30.0000 mg | ORAL_TABLET | Freq: Every day | ORAL | 0 refills | Status: DC

## 2017-09-23 NOTE — Telephone Encounter (Signed)
Patients daughter very adament about getting Epic brand filled for Adderall because it has shown to have less anxiety producing effects that previous brands such as Teva.  Her recent Rx changed from 30 mg daily to 20 mg BID.  Pharmacist Phil states that the Epic brand does not carry 20 mg tablets in Adderall and he would be unable to fill it with the brand requested and needed to know if the patient would be willing to switch to a different manufacture.  After speaking with daughter she decided she did not want to change brands and would cancel this recent Rx with increase and just use 30 mg daily.    Called [facility] to advise to cancel recent RX.  We will reorder at previous Rx of 30 mg once daily.

## 2017-09-24 ENCOUNTER — Telehealth: Payer: Self-pay | Admitting: Pharmacist

## 2017-09-24 ENCOUNTER — Telehealth: Payer: Self-pay | Admitting: *Deleted

## 2017-09-24 ENCOUNTER — Other Ambulatory Visit: Payer: Self-pay | Admitting: Internal Medicine

## 2017-09-24 ENCOUNTER — Encounter: Payer: Self-pay | Admitting: Internal Medicine

## 2017-09-24 DIAGNOSIS — C719 Malignant neoplasm of brain, unspecified: Secondary | ICD-10-CM

## 2017-09-24 DIAGNOSIS — R2689 Other abnormalities of gait and mobility: Secondary | ICD-10-CM | POA: Diagnosis not present

## 2017-09-24 DIAGNOSIS — C712 Malignant neoplasm of temporal lobe: Secondary | ICD-10-CM | POA: Diagnosis not present

## 2017-09-24 MED ORDER — LOMUSTINE 100 MG PO CAPS: 100.0000 mg | ORAL_CAPSULE | Freq: Once | ORAL | 0 refills | Status: AC

## 2017-09-24 MED ORDER — LOMUSTINE 10 MG PO CAPS: 20.0000 mg | ORAL_CAPSULE | Freq: Once | ORAL | 0 refills | Status: AC

## 2017-09-24 MED ORDER — LOMUSTINE 40 MG PO CAPS: 40.0000 mg | ORAL_CAPSULE | Freq: Once | ORAL | 0 refills | Status: AC

## 2017-09-24 NOTE — Telephone Encounter (Signed)
Received call from daughter advising that patient decided to proceed with Lomustine.  Dr Mickeal Skinner ordered and patients family notified via e-mail that oral chemo pharmacist will be in contact with them once it is verified and explain where it will be filled.  They need to notify us when she gets the medication and takes her dose because she will need labs every 2 weeks to monitor following the initiation of this drug.    Letter for necessity also submitted to family via e-mail to help support possible discount for CBD capsules that are helping the patient with anxiety.

## 2017-09-24 NOTE — Telephone Encounter (Signed)
Oral Chemotherapy Pharmacist Encounter   I spoke with patient's daughter, Kelly Maxwell, for overview of: lomustine.  Counseled on administration, dosing, side effects, monitoring, drug-food interactions, safe handling, storage, and disposal.  Patient will take lomustine 100mg  capsules x 1 plus 40mg  capsules x 1 plus 10mg  capsules x 2 (160mg  total) by mouth once, may take at bedtime and on an empty stomach to decrease nausea and vomiting.  Lomustine will be administered once every 6 weeks after blood counts have returned to adequate levels.  Lomustine start date: 09/26/2017 or 09/27/2017   Patient will take Zofran 8mg  tablet, 1 tablet by mouth 30-60 min prior to lomustine dose to help decrease N/V.   Adverse effects include but are not limited to: nausea, vomiting, stomatitis, alopecia, decreased blood counts, alopecia, hepatotoxicity, and pulmonary toxicity.  Reviewed importance of keeping a medication schedule and plan for any missed doses.  Kelly Maxwell voiced understanding and appreciation.   All questions answered. Medication reconciliation performed and medication/allergy list updated.  I updated Kelly Maxwell on medication acquisition and copayment. The lomustine capsules will be ready for pick-up at the [facility] on 09/26/2017 after 2pm. Copayment due is $396.28 total. Kelly Maxwell stated this information should be discussed and coordinated with patient's husband.  Kelly Maxwell knows to call the office with questions or concerns. Oral Oncology Clinic will continue to follow.  Thank you,  Kelly Maxwell, PharmD, BCPS, BCOP  09/24/2017   4:02 PM Oral Oncology Clinic [phone]

## 2017-09-24 NOTE — Progress Notes (Signed)
DISCONTINUE ON PATHWAY REGIMEN - Neuro     A cycle is every 14 days:     Bevacizumab   **Always confirm dose/schedule in your pharmacy ordering system**  REASON: Disease Progression PRIOR TREATMENT: [id number]: Bevacizumab 10 mg/kg q14 Days TREATMENT RESPONSE: Progressive Disease (PD)  START ON PATHWAY REGIMEN - Neuro     A cycle is every 42 days.:     Lomustine   **Always confirm dose/schedule in your pharmacy ordering system**  Patient Characteristics: Glioblastoma, Recurrent or Progressive, Nonsurgical Candidate, Systemic Therapy Candidate Disease Classification: Glioma Disease Classification: Glioblastoma Disease Status: Recurrent or Progressive Treatment Classification: Nonsurgical Candidate Treatment (Nonsurgical/Adjuvant): Systemic Therapy Candidate Intent of Therapy: Non-Curative / Palliative Intent, Discussed with Patient

## 2017-09-24 NOTE — Telephone Encounter (Signed)
Received call from Kelly Maxwell with [employer].  He wanted to advise that patient had a fall at home yesterday with no injury.

## 2017-09-24 NOTE — Telephone Encounter (Signed)
Oral Oncology Pharmacist Encounter  Received new prescription for lomustine for the treatment of recurrent gliosarcoma / glioblastoma, planned duration until disease progression or unacceptable toxicity. Lomustine will be given low dose monotherapy at 90 mg./m2 every 6 weeks.  Labs from 08/09/2017 assessed, OK for treatment.  Current medication list in Epic reviewed, no DDIs with lomustine identified.  Prescription has been e-scribed to the [facility] for benefits analysis and approval.  Oral Oncology Clinic will continue to follow for insurance authorization, copayment issues, initial counseling and start date.  Johny Drilling, PharmD, BCPS, BCOP  09/24/2017 2:26 PM Oral Oncology Clinic [phone]

## 2017-09-25 MED ORDER — ONDANSETRON HCL 8 MG PO TABS: ORAL_TABLET | ORAL | 1 refills | Status: DC

## 2017-09-25 NOTE — Telephone Encounter (Signed)
Oral Chemotherapy Pharmacist Encounter   I spoke with patient's husband, Kelly Maxwell, for overview of: lomustine.  Counseled on administration, dosing, side effects, monitoring, drug-food interactions, safe handling, storage, and disposal.  Patient will take lomustine 100mg  capsules x 1 plus 40mg  capsules x 1 plus 10mg  capsules x 2 (160mg  total) by mouth once, taken at bedtime and on an empty stomach to decrease nausea and vomiting.  Lomustine will be administered once every 6 weeks after blood counts have returned to adequate levels.  Lomustine start date: 09/26/2017  Patient will take Zofran 8mg  tablet, 1 tablet by mouth 30-60 min prior to lomustine dose to help decrease N/V.  New prescription for Zofran 8mg  has been e-scribed to [facility] on [address] per [name]'s request.  Adverse effects include but are not limited to: nausea, vomiting, stomatitis, alopecia, decreased blood counts, alopecia, hepatotoxicity, and pulmonary toxicity.  Reviewed importance of keeping a medication schedule and plan for any missed doses.  Medication reconciliation performed and medication/allergy list updated.  I updated Kelly Maxwell on medication acquisition and copayment. The lomustine capsules will be ready for pick-up at the [facility] on 09/26/2017 after 2pm. Copayment due is $396.28 total. Kelly Maxwell will pick-up the lomustine tomorrow and Mrs. Aguinaga will take her dose tomorrow evening. He understands the office will be contacting them to arrange for lab checks and follow-up now that lomustine start date is known.  Kelly Maxwell voiced understanding and appreciation.   All questions answered.  Kelly Maxwell knows to call the office with questions or concerns. Oral Oncology Clinic will continue to follow.  Thank you,  Johny Drilling, PharmD, BCPS, BCOP  09/25/2017  10:49 AM  Oral Oncology Clinic [phone]

## 2017-09-26 DIAGNOSIS — R2689 Other abnormalities of gait and mobility: Secondary | ICD-10-CM | POA: Diagnosis not present

## 2017-09-26 DIAGNOSIS — C712 Malignant neoplasm of temporal lobe: Secondary | ICD-10-CM | POA: Diagnosis not present

## 2017-09-26 MED FILL — GLEOSTINE 100 MG CAPSULE: 100 | 1 days supply | Qty: 1 | Fill #0

## 2017-09-26 MED FILL — GLEOSTINE 10 MG CAPSULE: 10 | 1 days supply | Qty: 2 | Fill #0

## 2017-09-26 MED FILL — GLEOSTINE 40 MG CAPSULE: 40 | 1 days supply | Qty: 1 | Fill #0

## 2017-09-26 NOTE — Telephone Encounter (Signed)
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  I called the patient's husband to let him know and he expressed great appreciation.  [place] Patient [place] Phone [phone] Fax [phone]

## 2017-09-27 ENCOUNTER — Other Ambulatory Visit: Payer: Medicare HMO | Admitting: Internal Medicine

## 2017-09-27 ENCOUNTER — Telehealth: Payer: Self-pay | Admitting: *Deleted

## 2017-09-27 DIAGNOSIS — R2681 Unsteadiness on feet: Secondary | ICD-10-CM

## 2017-09-27 DIAGNOSIS — C719 Malignant neoplasm of brain, unspecified: Secondary | ICD-10-CM

## 2017-09-27 DIAGNOSIS — Z515 Encounter for palliative care: Secondary | ICD-10-CM | POA: Diagnosis not present

## 2017-09-27 DIAGNOSIS — R2689 Other abnormalities of gait and mobility: Secondary | ICD-10-CM | POA: Diagnosis not present

## 2017-09-27 DIAGNOSIS — R531 Weakness: Secondary | ICD-10-CM

## 2017-09-27 DIAGNOSIS — C712 Malignant neoplasm of temporal lobe: Secondary | ICD-10-CM | POA: Diagnosis not present

## 2017-09-27 NOTE — Telephone Encounter (Signed)
Oral Oncology Patient Advocate Encounter  Lomustine was picked up from [facility] on 09-27-17.  [place] Patient [place] Phone [phone] Fax [phone]

## 2017-09-27 NOTE — Progress Notes (Signed)
PALLIATIVE CARE CONSULT VISIT   PATIENT NAME: Kelly Maxwell DOB: 12-02-1945 MRN: [id number]  PRIMARY CARE PROVIDER:   Dr. Gae Maxwell PROVIDER:  Dr. Mickeal Maxwell  RESPONSIBLE PARTY:   Kelly Maxwell (daughter),  Kelly Maxwell(husband) [phone] cell, [phone] hm        RECOMMENDATIONS and PLAN:  1.Generalized weakness  R 53.1: Chronic state related to brain cancer and deconditioned state. Current treatment for UTI. Expect additional decline as disease process progresses in the future.  Encouraged improved hydration(liquids and fruit) which is altered by dysphagia. Restart magic mouthwash.  Continue speech therapy and PT as tolerated.  2. Unsteady gait  R56.81: Unchanged.  Multifactorial.  Gait and transfer training by PT.  Fall prevention reinforced  to spouse.    3.  Gliosarcoma- L temporal lobe  C71.9:  No reports of seizure activity on Vimpat and Decadron.  Receiving oral chemo. Managed by Neuro Oncologist.  Monitor  4.  Palliative care encounter  Z51.5 : Pt remains a full code with full scope of treatment.  Goals of care: Continue all therapies including current oral chemo.  Provide in home personal care, PT/OT/speech provided by family and hired aids. Quality of life measures re-iterated to spouse.   Palliative care will continue to follow.  I spent 30 minutes providing this consultation,  from 12:00pm to 12:30pm. More than 50% of the time in this consultation was spent coordinating communication with patient and  husband.  HISTORY OF PRESENT ILLNESS: Follow-up with Kelly Maxwell.  Husband states that she is currently being treated for a UTI and he is attempting to keep patient hydrated.  She is still having difficulty with swallowing due to discomfort.  No reported choking episodes.  She was able to take oral chemo tablet and will have F/U with Kelly Maxwell in near future.  Meds have to be given in applesauce now related to swallowing and patient refuses at times.  She remains total  care and transfers with assistance.  No reports of headaches or seizure activity.  CODE STATUS: FULL CODE/FULL SCOPE OF TREATMENT  PPS:  40% with total care  HOSPICE ELIGIBILITY/DIAGNOSIS: YES/ Gliosarcoma but receiving chemo and therapies PAST MEDICAL HISTORY:  Past Medical History:  Diagnosis Date  . Cancer ([place])   . Depression   . GERD (gastroesophageal reflux disease)   . 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 Headache(784.0)   . Hiatal hernia   . History of radiation therapy 01/07/2012-02/18/12   left temporal  60GY  . HLD (hyperlipidemia)   . PONV (postoperative nausea and vomiting)   . 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 Smoking status: Never Smoker  . Smokeless tobacco: Never Used  Substance Use Topics  . Alcohol use: No    ALLERGIES:  Allergies  Allergen Reactions  . Codeine Nausea And Vomiting and Nausea Only  . 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    . Compazine [Prochlorperazine Edisylate] Other (See Comments) and Rash    Became hyper Became hyper     PERTINENT MEDICATIONS:  Outpatient Encounter Medications as of 09/13/2017  Medication Sig  . amLODipine (NORVASC) 5 MG tablet Take 1 tablet (5 mg total) by mouth daily.  Marland Kitchen  amphetamine-dextroamphetamine (ADDERALL) 30 MG tablet Take 1 tablet by mouth daily.  . Bromfenac Sodium (PROLENSA) 0.07 % SOLN Place 1 drop into the right eye 3 (three) times daily. Reported on 09/02/2015  . cholecalciferol (VITAMIN D) 1000 units tablet Take 1 tablet (1,000 Units total) by mouth daily.  . ciprofloxacin (CIPRO) 500 MG tablet Take 1 tablet by mouth daily. Take for 5 days  . clonazePAM (KLONOPIN) 0.5 MG tablet Take 1  tablet (0.5 mg total) by mouth at bedtime as needed for anxiety. (Patient not taking: Reported on 08/09/2017)  . dexamethasone (DECADRON) 2 MG tablet Take 1 tablet (2 mg total) by mouth daily.  . Difluprednate (DUREZOL) 0.05 % EMUL Apply 1 drop to eye 4 (four) times daily.  Marland Kitchen lacosamide (VIMPAT) 50 MG TABS tablet Take 1 tablet (50 mg total) by mouth 2 (two) times daily.  [name] (ATIVAN) 0.5 MG tablet TAKE 1 TABLET 30 TO 60 MINUTES PRIOR TO MRI. MAY REPEAT ONCE FOR 1 ADDITIONAL RELIEF  . Melatonin 5 MG CAPS Take 1-2 capsules by mouth at bedtime as needed (sleep).   . NON FORMULARY Take 1 drop by mouth 2 (two) times daily. Lazarus Natural CBD Oil  . nystatin cream (MYCOSTATIN) Apply 1 application topically as needed.  . ondansetron (ZOFRAN) 8 MG tablet Take 1 tab (8mg ) by mouth 1 hour before daily temozolomide. Take every 8 hours as needed for nausea.  . pantoprazole (PROTONIX) 40 MG tablet Take 1 tablet (40 mg total) by mouth daily.  Marland Kitchen senna-docusate (SENOKOT-S) 8.6-50 MG tablet Take 1 tablet by mouth at bedtime as needed for mild constipation.  Marland Kitchen tretinoin (RETIN-A) 0.05 % cream Apply 1 application topically at bedtime as needed. spots   No facility-administered encounter medications on file as of 09/13/2017.     PHYSICAL EXAM:    General: Chronically ill appearing elderly female sitting in chair.  In NAD EENT:  Pupils are dilated and equal.  Dry mucus membranes Cardiovascular: regular rate and rhythm Pulmonary: clear in all lung fields Abdomen: soft, nontender, + bowel sounds Extremities: no edema Skin: exposed skin is intact.  Poor skin turgor Neurological: Alert and oriented x2.  Generalized weakness.  Word searching.  Speaks in brief sentences or nods.  Kelly Lex, NP-C

## 2017-09-27 NOTE — Telephone Encounter (Signed)
"  Kelly Maxwell [phone]) calling to see if my mother's medication for seizures needs to be increased.  She has increased confusion and focal seizures.  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  Maybe things will settle more if medication increased. "

## 2017-09-30 ENCOUNTER — Other Ambulatory Visit: Payer: Self-pay | Admitting: *Deleted

## 2017-09-30 DIAGNOSIS — N898 Other specified noninflammatory disorders of vagina: Secondary | ICD-10-CM | POA: Diagnosis not present

## 2017-09-30 DIAGNOSIS — R140 Abdominal distension (gaseous): Secondary | ICD-10-CM | POA: Diagnosis not present

## 2017-09-30 DIAGNOSIS — L89212 Pressure ulcer of right hip, stage 2: Secondary | ICD-10-CM | POA: Diagnosis not present

## 2017-09-30 DIAGNOSIS — N390 Urinary tract infection, site not specified: Secondary | ICD-10-CM | POA: Diagnosis not present

## 2017-10-01 ENCOUNTER — Telehealth: Payer: Self-pay | Admitting: *Deleted

## 2017-10-01 ENCOUNTER — Other Ambulatory Visit: Payer: Self-pay | Admitting: *Deleted

## 2017-10-01 DIAGNOSIS — C719 Malignant neoplasm of brain, unspecified: Secondary | ICD-10-CM

## 2017-10-01 NOTE — Telephone Encounter (Signed)
Spoke to Dr Mickeal Skinner regarding fluctuating changes in patients behavior per the daughter [name] who also reports improvement.  He does not wish to make any changes to any medications especially her seizure medications at this time.

## 2017-10-02 ENCOUNTER — Other Ambulatory Visit: Payer: Self-pay | Admitting: Internal Medicine

## 2017-10-02 DIAGNOSIS — R4189 Other symptoms and signs involving cognitive functions and awareness: Secondary | ICD-10-CM

## 2017-10-02 DIAGNOSIS — C719 Malignant neoplasm of brain, unspecified: Secondary | ICD-10-CM

## 2017-10-02 NOTE — Progress Notes (Signed)
.  sp

## 2017-10-03 ENCOUNTER — Telehealth: Payer: Self-pay | Admitting: Medical Oncology

## 2017-10-03 DIAGNOSIS — C712 Malignant neoplasm of temporal lobe: Secondary | ICD-10-CM | POA: Diagnosis not present

## 2017-10-03 DIAGNOSIS — R2689 Other abnormalities of gait and mobility: Secondary | ICD-10-CM | POA: Diagnosis not present

## 2017-10-03 NOTE — Telephone Encounter (Signed)
Requests wound care orders.

## 2017-10-04 ENCOUNTER — Encounter: Payer: Self-pay | Admitting: Physical Therapy

## 2017-10-04 NOTE — Therapy (Signed)
[facility] [address] [place], [place], [address] Phone: [phone]   Fax:  [phone]  Patient Details  Name: Kelly Maxwell MRN: [id number] Date of Birth: 1946/03/05 Referring Provider:  No ref. provider found  Encounter Date: 10/04/2017 PHYSICAL THERAPY DISCHARGE SUMMARY  Visits from Start of Care: 4  Current functional level related to goals / functional outcomes: Refer to final progress note 05/24/17   Remaining deficits: Refer to final progress note 05/24/17   Education / Equipment: Refer to final progress note 05/24/17  Plan: Patient agrees to discharge.  Patient goals were not met. Patient is being discharged due to lack of progress.  ?????       Rexanne Mano , PT 10/04/2017, 8:50 AM  [facility] [address] [place], [place], [address] Phone: [phone]   Fax:  [phone]

## 2017-10-07 ENCOUNTER — Other Ambulatory Visit: Payer: Medicare HMO | Admitting: Internal Medicine

## 2017-10-07 DIAGNOSIS — R1310 Dysphagia, unspecified: Secondary | ICD-10-CM

## 2017-10-07 DIAGNOSIS — Z515 Encounter for palliative care: Secondary | ICD-10-CM

## 2017-10-07 DIAGNOSIS — R531 Weakness: Secondary | ICD-10-CM

## 2017-10-07 DIAGNOSIS — R140 Abdominal distension (gaseous): Secondary | ICD-10-CM | POA: Diagnosis not present

## 2017-10-08 ENCOUNTER — Other Ambulatory Visit: Payer: Self-pay

## 2017-10-08 ENCOUNTER — Encounter (HOSPITAL_COMMUNITY): Payer: Self-pay | Admitting: *Deleted

## 2017-10-08 ENCOUNTER — Observation Stay (HOSPITAL_COMMUNITY)
Admission: EM | Admit: 2017-10-08 | Discharge: 2017-10-11 | Disposition: A | Discharge status: Home / Self Care | Payer: Medicare HMO | Attending: Internal Medicine | Admitting: Internal Medicine

## 2017-10-08 ENCOUNTER — Emergency Department (HOSPITAL_COMMUNITY): Payer: Medicare HMO

## 2017-10-08 DIAGNOSIS — R918 Other nonspecific abnormal finding of lung field: Secondary | ICD-10-CM | POA: Diagnosis not present

## 2017-10-08 DIAGNOSIS — N281 Cyst of kidney, acquired: Secondary | ICD-10-CM | POA: Diagnosis not present

## 2017-10-08 DIAGNOSIS — E119 Type 2 diabetes mellitus without complications: Secondary | ICD-10-CM | POA: Diagnosis not present

## 2017-10-08 DIAGNOSIS — G3184 Mild cognitive impairment, so stated: Secondary | ICD-10-CM | POA: Diagnosis not present

## 2017-10-08 DIAGNOSIS — R69 Illness, unspecified: Secondary | ICD-10-CM | POA: Diagnosis not present

## 2017-10-08 DIAGNOSIS — G629 Polyneuropathy, unspecified: Secondary | ICD-10-CM | POA: Diagnosis not present

## 2017-10-08 DIAGNOSIS — R404 Transient alteration of awareness: Secondary | ICD-10-CM | POA: Diagnosis not present

## 2017-10-08 DIAGNOSIS — E860 Dehydration: Secondary | ICD-10-CM | POA: Diagnosis not present

## 2017-10-08 DIAGNOSIS — I10 Essential (primary) hypertension: Secondary | ICD-10-CM | POA: Insufficient documentation

## 2017-10-08 DIAGNOSIS — R4182 Altered mental status, unspecified: Secondary | ICD-10-CM | POA: Diagnosis not present

## 2017-10-08 DIAGNOSIS — R569 Unspecified convulsions: Secondary | ICD-10-CM | POA: Diagnosis not present

## 2017-10-08 DIAGNOSIS — I700 Atherosclerosis of aorta: Secondary | ICD-10-CM | POA: Diagnosis not present

## 2017-10-08 DIAGNOSIS — K219 Gastro-esophageal reflux disease without esophagitis: Secondary | ICD-10-CM | POA: Diagnosis not present

## 2017-10-08 DIAGNOSIS — N390 Urinary tract infection, site not specified: Secondary | ICD-10-CM | POA: Diagnosis not present

## 2017-10-08 DIAGNOSIS — C719 Malignant neoplasm of brain, unspecified: Secondary | ICD-10-CM | POA: Diagnosis not present

## 2017-10-08 DIAGNOSIS — R1084 Generalized abdominal pain: Secondary | ICD-10-CM | POA: Insufficient documentation

## 2017-10-08 DIAGNOSIS — R001 Bradycardia, unspecified: Secondary | ICD-10-CM | POA: Diagnosis not present

## 2017-10-08 DIAGNOSIS — Z7984 Long term (current) use of oral hypoglycemic drugs: Secondary | ICD-10-CM | POA: Insufficient documentation

## 2017-10-08 DIAGNOSIS — G9341 Metabolic encephalopathy: Secondary | ICD-10-CM | POA: Diagnosis not present

## 2017-10-08 DIAGNOSIS — R2241 Localized swelling, mass and lump, right lower limb: Secondary | ICD-10-CM | POA: Diagnosis not present

## 2017-10-08 DIAGNOSIS — R0902 Hypoxemia: Secondary | ICD-10-CM | POA: Diagnosis not present

## 2017-10-08 DIAGNOSIS — Z79899 Other long term (current) drug therapy: Secondary | ICD-10-CM | POA: Insufficient documentation

## 2017-10-08 DIAGNOSIS — H547 Unspecified visual loss: Secondary | ICD-10-CM | POA: Diagnosis not present

## 2017-10-08 LAB — URINALYSIS, ROUTINE W REFLEX MICROSCOPIC
BILIRUBIN URINE: NEGATIVE
Glucose, UA: NEGATIVE mg/dL
Hgb urine dipstick: NEGATIVE
Ketones, ur: 5 mg/dL — AB
Leukocytes, UA: NEGATIVE
NITRITE: NEGATIVE
PROTEIN: NEGATIVE mg/dL
Specific Gravity, Urine: 1.016 (ref 1.005–1.030)
pH: 6 (ref 5.0–8.0)

## 2017-10-08 LAB — CBC WITH DIFFERENTIAL/PLATELET
Basophils Absolute: 0 10*3/uL (ref 0.0–0.1)
Basophils Relative: 0 %
Eosinophils Absolute: 0 10*3/uL (ref 0.0–0.7)
Eosinophils Relative: 0 %
HEMATOCRIT: 36.3 % (ref 36.0–46.0)
HEMOGLOBIN: 11.5 g/dL — AB (ref 12.0–15.0)
Lymphocytes Relative: 6 %
Lymphs Abs: 0.4 10*3/uL — ABNORMAL LOW (ref 0.7–4.0)
MCH: 30.3 pg (ref 26.0–34.0)
MCHC: 31.7 g/dL (ref 30.0–36.0)
MCV: 95.5 fL (ref 78.0–100.0)
Monocytes Absolute: 0.2 10*3/uL (ref 0.1–1.0)
Monocytes Relative: 3 %
Neutro Abs: 7.4 10*3/uL (ref 1.7–7.7)
Neutrophils Relative %: 91 %
Platelets: 235 10*3/uL (ref 150–400)
RBC: 3.8 MIL/uL — ABNORMAL LOW (ref 3.87–5.11)
RDW: 18.9 % — AB (ref 11.5–15.5)
WBC: 8.1 10*3/uL (ref 4.0–10.5)

## 2017-10-08 LAB — TROPONIN I

## 2017-10-08 LAB — COMPREHENSIVE METABOLIC PANEL
ALK PHOS: 62 U/L (ref 38–126)
ALT: 31 U/L (ref 0–44)
ANION GAP: 10 (ref 5–15)
AST: 30 U/L (ref 15–41)
Albumin: 3.2 g/dL — ABNORMAL LOW (ref 3.5–5.0)
BUN: 12 mg/dL (ref 8–23)
CHLORIDE: 107 mmol/L (ref 98–111)
CO2: 22 mmol/L (ref 22–32)
Calcium: 8.9 mg/dL (ref 8.9–10.3)
Creatinine, Ser: 0.31 mg/dL — ABNORMAL LOW (ref 0.44–1.00)
GFR calc non Af Amer: >60 mL/min (ref >60)
Glucose, Bld: 93 mg/dL (ref 70–99)
Potassium: 3.5 mmol/L (ref 3.5–5.1)
SODIUM: 139 mmol/L (ref 135–145)
Total Bilirubin: 0.5 mg/dL (ref 0.3–1.2)
Total Protein: 5.9 g/dL — ABNORMAL LOW (ref 6.5–8.1)

## 2017-10-08 LAB — I-STAT CHEM 8, ED
BUN: 11 mg/dL (ref 8–23)
CALCIUM ION: 1.16 mmol/L (ref 1.15–1.40)
Chloride: 105 mmol/L (ref 98–111)
Creatinine, Ser: 0.3 mg/dL — ABNORMAL LOW (ref 0.44–1.00)
GLUCOSE: 91 mg/dL (ref 70–99)
HCT: 34 % — ABNORMAL LOW (ref 36.0–46.0)
HEMOGLOBIN: 11.6 g/dL — AB (ref 12.0–15.0)
Potassium: 3.6 mmol/L (ref 3.5–5.1)
Sodium: 137 mmol/L (ref 135–145)
TCO2: 22 mmol/L (ref 22–32)

## 2017-10-08 LAB — LIPASE, BLOOD: Lipase: 44 U/L (ref 11–51)

## 2017-10-08 LAB — D-DIMER, QUANTITATIVE (NOT AT ARMC): D DIMER QUANT: 0.68 ug{FEU}/mL — AB (ref 0.00–0.50)

## 2017-10-08 MED ORDER — IOPAMIDOL (ISOVUE-370) INJECTION 76%
100.0000 mL | Freq: Once | INTRAVENOUS | Status: AC | PRN
  Administered 2017-10-08: 100 mL via INTRAVENOUS

## 2017-10-08 MED ORDER — SODIUM CHLORIDE 0.9 % IV BOLUS
500.0000 mL | Freq: Once | INTRAVENOUS | Status: AC
  Administered 2017-10-08: 500 mL via INTRAVENOUS

## 2017-10-08 MED ORDER — LACOSAMIDE 50 MG PO TABS
50.0000 mg | ORAL_TABLET | Freq: Two times a day (BID) | ORAL | Status: DC
  Administered 2017-10-08 – 2017-10-11 (×5): 50 mg via ORAL
  Filled 2017-10-08 (×6): qty 1

## 2017-10-08 MED ORDER — IOPAMIDOL (ISOVUE-370) INJECTION 76%
INTRAVENOUS | Status: AC
  Filled 2017-10-08: qty 100

## 2017-10-08 MED ORDER — SODIUM CHLORIDE 0.9 % IV SOLN
INTRAVENOUS | Status: DC
  Administered 2017-10-08 – 2017-10-11 (×5): via INTRAVENOUS

## 2017-10-08 NOTE — ED Triage Notes (Signed)
Per family, patient was more altered than normal, hx of same. Per EMS, family states its because she is dehydrated. Patient not willing to take PO fluids. Patient currently on antibiotics, unknown for what. Hx brain cancer, intermittent ability to talk and communicate.

## 2017-10-08 NOTE — ED Notes (Signed)
Bed: WA17 Expected date: 10/08/17 Expected time: 6:29 PM Means of arrival: Ambulance Comments: EMS

## 2017-10-09 ENCOUNTER — Encounter (HOSPITAL_COMMUNITY): Payer: Self-pay

## 2017-10-09 ENCOUNTER — Observation Stay (HOSPITAL_BASED_OUTPATIENT_CLINIC_OR_DEPARTMENT_OTHER): Payer: Medicare HMO

## 2017-10-09 DIAGNOSIS — E119 Type 2 diabetes mellitus without complications: Secondary | ICD-10-CM

## 2017-10-09 DIAGNOSIS — G9341 Metabolic encephalopathy: Secondary | ICD-10-CM

## 2017-10-09 DIAGNOSIS — M7989 Other specified soft tissue disorders: Secondary | ICD-10-CM | POA: Diagnosis not present

## 2017-10-09 LAB — GLUCOSE, CAPILLARY
GLUCOSE-CAPILLARY: 74 mg/dL (ref 70–99)
GLUCOSE-CAPILLARY: 114 mg/dL — AB (ref 70–99)
Glucose-Capillary: 64 mg/dL — ABNORMAL LOW (ref 70–99)
Glucose-Capillary: 94 mg/dL (ref 70–99)
Glucose-Capillary: 113 mg/dL — ABNORMAL HIGH (ref 70–99)

## 2017-10-09 LAB — BASIC METABOLIC PANEL
Anion gap: 8 (ref 5–15)
BUN: 9 mg/dL (ref 8–23)
CHLORIDE: 109 mmol/L (ref 98–111)
CO2: 21 mmol/L — AB (ref 22–32)
Calcium: 8.5 mg/dL — ABNORMAL LOW (ref 8.9–10.3)
Creatinine, Ser: <0.3 mg/dL — ABNORMAL LOW (ref 0.44–1.00)
GLUCOSE: 79 mg/dL (ref 70–99)
POTASSIUM: 3.6 mmol/L (ref 3.5–5.1)
Sodium: 138 mmol/L (ref 135–145)

## 2017-10-09 MED ORDER — DIFLUPREDNATE 0.05 % OP EMUL: 1.0000 [drp] | Freq: Four times a day (QID) | OPHTHALMIC | Status: DC

## 2017-10-09 MED ORDER — ONDANSETRON HCL 4 MG/2ML IJ SOLN: 4.0000 mg | Freq: Four times a day (QID) | INTRAMUSCULAR | Status: DC | PRN

## 2017-10-09 MED ORDER — VITAMIN D3 25 MCG (1000 UNIT) PO TABS
1000.0000 [IU] | ORAL_TABLET | Freq: Every day | ORAL | Status: DC
  Administered 2017-10-10 – 2017-10-11 (×2): 1000 [IU] via ORAL
  Filled 2017-10-09 (×3): qty 1

## 2017-10-09 MED ORDER — ACETAMINOPHEN 650 MG RE SUPP: 650.0000 mg | Freq: Four times a day (QID) | RECTAL | Status: DC | PRN

## 2017-10-09 MED ORDER — DEXAMETHASONE 2 MG PO TABS
2.0000 mg | ORAL_TABLET | Freq: Every day | ORAL | Status: DC
  Administered 2017-10-09 – 2017-10-11 (×3): 2 mg via ORAL
  Filled 2017-10-09 (×3): qty 1

## 2017-10-09 MED ORDER — ACETAMINOPHEN 325 MG PO TABS: 650.0000 mg | ORAL_TABLET | Freq: Four times a day (QID) | ORAL | Status: DC | PRN

## 2017-10-09 MED ORDER — CLONAZEPAM 0.5 MG PO TABS
0.5000 mg | ORAL_TABLET | Freq: Every evening | ORAL | Status: DC | PRN
  Administered 2017-10-09 – 2017-10-10 (×2): 0.5 mg via ORAL
  Filled 2017-10-09 (×2): qty 1

## 2017-10-09 MED ORDER — PANTOPRAZOLE SODIUM 40 MG PO TBEC
40.0000 mg | DELAYED_RELEASE_TABLET | Freq: Every day | ORAL | Status: DC
  Administered 2017-10-10 – 2017-10-11 (×2): 40 mg via ORAL
  Filled 2017-10-09 (×3): qty 1

## 2017-10-09 MED ORDER — KETOROLAC TROMETHAMINE 0.5 % OP SOLN
1.0000 [drp] | Freq: Four times a day (QID) | OPHTHALMIC | Status: DC
  Administered 2017-10-09 – 2017-10-10 (×7): 1 [drp] via OPHTHALMIC
  Filled 2017-10-09: qty 5

## 2017-10-09 MED ORDER — AMLODIPINE BESYLATE 5 MG PO TABS
5.0000 mg | ORAL_TABLET | Freq: Every day | ORAL | Status: DC
  Administered 2017-10-10 – 2017-10-11 (×2): 5 mg via ORAL
  Filled 2017-10-09 (×3): qty 1

## 2017-10-09 MED ORDER — MEBENDAZOLE 100 MG PO CHEW: 100.0000 mg | CHEWABLE_TABLET | Freq: Every day | ORAL | Status: DC

## 2017-10-09 MED ORDER — INSULIN ASPART 100 UNIT/ML ~~LOC~~ SOLN
0.0000 [IU] | Freq: Three times a day (TID) | SUBCUTANEOUS | Status: DC
  Administered 2017-10-10: 2 [IU] via SUBCUTANEOUS
  Administered 2017-10-11: 1 [IU] via SUBCUTANEOUS

## 2017-10-09 MED ORDER — SENNOSIDES-DOCUSATE SODIUM 8.6-50 MG PO TABS: 1.0000 | ORAL_TABLET | Freq: Every evening | ORAL | Status: DC | PRN

## 2017-10-09 MED ORDER — ATORVASTATIN CALCIUM 40 MG PO TABS
40.0000 mg | ORAL_TABLET | Freq: Two times a day (BID) | ORAL | Status: DC
  Administered 2017-10-09 – 2017-10-11 (×5): 40 mg via ORAL
  Filled 2017-10-09 (×5): qty 1

## 2017-10-09 MED ORDER — ONDANSETRON HCL 4 MG PO TABS: 4.0000 mg | ORAL_TABLET | Freq: Four times a day (QID) | ORAL | Status: DC | PRN

## 2017-10-09 MED ORDER — AMPHETAMINE-DEXTROAMPHETAMINE 10 MG PO TABS
30.0000 mg | ORAL_TABLET | Freq: Every day | ORAL | Status: DC
  Administered 2017-10-09 – 2017-10-11 (×3): 30 mg via ORAL
  Filled 2017-10-09 (×3): qty 3

## 2017-10-09 MED ORDER — ENOXAPARIN SODIUM 40 MG/0.4ML ~~LOC~~ SOLN
40.0000 mg | Freq: Every day | SUBCUTANEOUS | Status: DC
  Administered 2017-10-09 – 2017-10-11 (×3): 40 mg via SUBCUTANEOUS
  Filled 2017-10-09 (×3): qty 0.4

## 2017-10-09 NOTE — Care Management Note (Signed)
Case Management Note  Patient Details  Name: SHARNIKA BINNEY MRN: [id number] Date of Birth: September 26, 1945  Subjective/Objective:CM asked to discuss resources for affording meds w/family. Left vm w/contact #'s in chart-[name](spouse) [phone]/Erinn(daughter)[phone]-await call back.                    Action/Plan:d/c plan home.   Expected Discharge Date:                  Expected Discharge Plan:  Home/Self Care  In-House Referral:     Discharge planning Services     Post Acute Care Choice:    Choice offered to:     DME Arranged:    DME Agency:     HH Arranged:    HH Agency:     Status of Service:  In process, will continue to follow  If discussed at Long Length of Stay Meetings, dates discussed:    Additional Comments:  Dessa Phi, RN 10/09/2017, 1:50 PM

## 2017-10-09 NOTE — H&P (Signed)
History and Physical    OASIS GOEHRING VVO:[id number] DOB: 07-09-1945 DOA: 10/08/2017  PCP: Oncology, Physician, MD  Patient coming from: Home  I have personally briefly reviewed patient's old medical records in [place]  Chief Complaint: AMS  HPI: Kelly Maxwell is a 72 y.o. female with medical history significant of gliosarcoma, DM2.  Patient brought in to ED by family for AMS.  Patient recently had admission in May for AMS which was determined to be due to a combination of dehydration and treatment of UTI with cipro.  At baseline, patient is described as: alert but not attentative oriented to self and environment language is impaired to fluency and comprehension.  Struggles with multiple step commands.  Very poor recall poor short-term memory.  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  Patient is also been visited by palliative care.  Patient is still a full code.  Today patient was noted to be more sleepy and less active.  Not taking POs very well.  Family concerned for dehydration.  Of note patient just finished Keflex today for treatment of UTI.   ED Course: After IVF patient's mental status is already improving per family and she is once again somewhat verbal.   Review of Systems: As per HPI otherwise 10 point review of systems negative.   Past Medical History:  Diagnosis Date  . Cancer ([place])   . Depression   . GERD (gastroesophageal reflux disease)   . 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 Headache(784.0)   . Hiatal hernia   . History of radiation therapy 01/07/2012-02/18/12   left temporal  58GY  . HLD (hyperlipidemia)   . PONV (postoperative nausea and vomiting)   . 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 BUNIONECTOMY  2001   right  . CATARACT EXTRACTION Left   . CRANIOTOMY  12/17/2011   Procedure: CRANIOTOMY TUMOR EXCISION;  Surgeon: Otilio Connors, MD;  Location: [facility];  Service: Neurosurgery;  Laterality: Left;  LEFT temporal craniotomy with stealth for tumor resection  . DILATION AND CURETTAGE OF UTERUS  2007  . TOTAL KNEE ARTHROPLASTY  2000   left  . VENTRAL HERNIA REPAIR N/A 09/03/2015   Procedure: LAPAROSCOPIC repair of incarcerated spigelian hernia with mesh, reduction and repair ;  Surgeon: [name], MD;  Location: [facility];  Service: General;  Laterality: N/A;     reports that she has never smoked. She has never used smokeless tobacco. She reports that she does not drink alcohol or use drugs.  Allergies  Allergen Reactions  . Ciprofloxacin     AMS  . Codeine Nausea And Vomiting and Nausea Only  . 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    . Compazine [Prochlorperazine Edisylate] Other (See Comments) and Rash    Became hyper Became hyper    Family History  Problem Relation Age of Onset  . Heart disease Mother      Prior to Admission medications   Medication Sig Start Date End Date Taking? Authorizing Provider  amLODipine (NORVASC) 5 MG tablet Take 1 tablet (5 mg total) by mouth daily. 08/26/17  Yes Vaslow, Acey Lav, MD  amphetamine-dextroamphetamine (ADDERALL) 30 MG tablet Take 1 tablet by mouth daily. 09/23/17  Yes Vaslow, Acey Lav, MD  atorvastatin (LIPITOR) 40 MG tablet Take 40 mg by mouth 2 (two) times daily.    Yes [provider]  Bromfenac Sodium (PROLENSA) 0.07 % SOLN Place 1 drop into the right eye 3 (three) times daily. Reported on 09/02/2015   Yes [provider]  cephALEXin (KEFLEX) 500 MG capsule Take 500 mg by mouth 2 (two) times daily. 10/01/17  Yes [provider]  cholecalciferol (VITAMIN D) 1000 units tablet Take 1  tablet (1,000 Units total) by mouth daily. 06/20/17  Yes Vaslow, Acey Lav, MD  dexamethasone (DECADRON) 2 MG tablet Take 1 tablet (2 mg total) by mouth daily. 08/09/17  Yes Vaslow, Acey Lav, MD  Diaper Rash Products ([employer] DIAPER) 1-10 % CREA APPLY EXTERNALLY AS DIRECTED AS NEEDED WITH EACH [facility] CHANGE 09/06/17  Yes [provider]  Difluprednate (DUREZOL) 0.05 % EMUL Place 1 drop into the left eye 4 (four) times daily.    Yes [provider]  Diphenhyd-Hydrocort-Nystatin (FIRST-DUKES MOUTHWASH MT) Take 5 mLs by mouth every 5 (five) hours as needed for mouth pain. 09/10/17  Yes [provider]  GLEOSTINE 10 MG capsule Take 10 mg by mouth daily. 09/26/17  Yes [provider]  GLEOSTINE 100 MG capsule Take 100 mg by mouth daily. 09/26/17  Yes [provider]  GLEOSTINE 40 MG capsule Take 40 mg by mouth daily. 09/26/17  Yes [provider]  lacosamide (VIMPAT) 50 MG TABS tablet Take 1 tablet (50 mg total) by mouth 2 (two) times daily. 06/24/17  Yes Vaslow, Acey Lav, MD  mebendazole (VERMOX) 100 MG chewable tablet Chew 100 mg by mouth daily.    Yes [provider]  Melatonin 5 MG CAPS Take 10-20 mg by mouth at bedtime as needed (sleep).    Yes [provider]  metFORMIN (GLUCOPHAGE) 500 MG tablet Take by mouth 2 (two) times daily with a meal.   Yes [provider]  NON FORMULARY Take 1 capsule by mouth 2 (two) times daily. Jennye Moccasin CBD Oil    Yes [provider]  nystatin cream (MYCOSTATIN) Apply 1 application topically as needed. 08/21/17  Yes [provider]  pantoprazole (PROTONIX) 40 MG tablet Take 1 tablet (40 mg total) by mouth daily. 08/09/17  Yes Vaslow, Acey Lav, MD  senna-docusate (SENOKOT-S) 8.6-50 MG tablet Take 1 tablet by mouth at bedtime as needed for mild constipation. 07/19/17  Yes Eugenie Filler, MD  Steva Colder Extract POWD 1 packet by Does not apply route.    [provider]  clonazePAM (KLONOPIN) 0.5 MG tablet Take 1 tablet (0.5 mg total) by mouth at bedtime as needed for anxiety. 07/19/17   Eugenie Filler, MD  ondansetron (ZOFRAN) 8 MG tablet Take 1 tablets (8mg ) by mouth once 30-60 prior to lomustine and Q 8h prn N/V 09/25/17   Ventura Sellers, MD    Physical Exam: Vitals:   10/08/17 1837 10/08/17 2135 10/09/17 0003  BP: 140/79 126/81 126/82  Pulse: 98 79 73  Resp: 15 20 16   Temp: (!) 97.4 F (36.3 C) 97.7 F (36.5 C)   TempSrc: Axillary Oral   SpO2: 95% 99% 94%  Weight: 65.8 kg (145 lb)    Height: 5\' 5"  (1.651 m)      Constitutional: NAD, calm, comfortable Eyes: PERRL, lids and conjunctivae normal ENMT: Mucous membranes are moist. Posterior pharynx clear of any  exudate or lesions.Normal dentition.  Neck: normal, supple, no masses, no thyromegaly Respiratory: clear to auscultation bilaterally, no wheezing, no crackles. Normal respiratory effort. No accessory muscle use.  Cardiovascular: Regular rate and rhythm, no murmurs / rubs / gallops. No extremity edema. 2+ pedal pulses. No carotid bruits.  Abdomen: no tenderness, no masses palpated. No hepatosplenomegaly. Bowel sounds positive.  Musculoskeletal: no clubbing / cyanosis. No joint deformity upper and lower extremities. Good ROM, no contractures. Normal muscle tone.  Skin: no rashes, lesions, ulcers. No induration Neurologic: CN 2-12 grossly intact. Sensation intact, DTR normal. Strength 5/5 in all 4.  Psychiatric: Normal judgment and insight. Alert and oriented x 3. Normal mood.    Labs on Admission: I have personally reviewed following labs and imaging studies  CBC: Recent Labs  Lab 10/08/17 2130 10/08/17 2147  WBC  --  8.1  NEUTROABS  --  7.4  HGB 11.6* 11.5*  HCT 34.0* 36.3  MCV  --  95.5  PLT  --  536   Basic Metabolic Panel: Recent Labs  Lab 10/08/17 2119 10/08/17 2130  NA 139 137  K 3.5 3.6  CL 107 105  CO2 22  --   GLUCOSE 93 91  BUN 12 11  CREATININE 0.31*  0.30*  CALCIUM 8.9  --    GFR: Estimated Creatinine Clearance: 56.4 mL/min (A) (by C-G formula based on SCr of 0.3 mg/dL (L)). Liver Function Tests: Recent Labs  Lab 10/08/17 2119  AST 30  ALT 31  ALKPHOS 62  BILITOT 0.5  PROT 5.9*  ALBUMIN 3.2*   Recent Labs  Lab 10/08/17 2119  LIPASE 44   No results for input(s): AMMONIA in the last 168 hours. Coagulation Profile: No results for input(s): INR, PROTIME in the last 168 hours. Cardiac Enzymes: Recent Labs  Lab 10/08/17 2119  TROPONINI <0.03   BNP (last 3 results) No results for input(s): PROBNP in the last 8760 hours. HbA1C: No results for input(s): HGBA1C in the last 72 hours. CBG: No results for input(s): GLUCAP in the last 168 hours. Lipid Profile: No results for input(s): CHOL, HDL, LDLCALC, TRIG, CHOLHDL, LDLDIRECT in the last 72 hours. Thyroid Function Tests: No results for input(s): TSH, T4TOTAL, FREET4, T3FREE, THYROIDAB in the last 72 hours. Anemia Panel: No results for input(s): VITAMINB12, FOLATE, FERRITIN, TIBC, IRON, RETICCTPCT in the last 72 hours. Urine analysis:    Component Value Date/Time   COLORURINE YELLOW 10/08/2017 1931   APPEARANCEUR CLEAR 10/08/2017 1931   LABSPEC 1.016 10/08/2017 1931   PHURINE 6.0 10/08/2017 1931   GLUCOSEU NEGATIVE 10/08/2017 1931   HGBUR NEGATIVE 10/08/2017 1931   BILIRUBINUR NEGATIVE 10/08/2017 1931   KETONESUR 5 (A) 10/08/2017 1931   PROTEINUR NEGATIVE 10/08/2017 1931   NITRITE NEGATIVE 10/08/2017 1931   LEUKOCYTESUR NEGATIVE 10/08/2017 1931    Radiological Exams on Admission: Dg Chest 2 View  Result Date: 10/08/2017 CLINICAL DATA:  Acute onset of altered mental status. Dehydration. EXAM: CHEST - 2 VIEW COMPARISON:  Chest radiograph performed 07/16/2017 FINDINGS: The lungs are hypoexpanded. Mild bibasilar airspace opacities likely reflect atelectasis. No pleural effusion or pneumothorax is seen. The heart is normal in size. No acute osseous abnormalities are  identified. IMPRESSION: Lungs hypoexpanded. Mild bibasilar airspace opacities likely reflect atelectasis. Electronically Signed   By: Garald Balding M.D.   On: 10/08/2017 22:20   Ct Head Wo Contrast  Result Date: 10/09/2017 CLINICAL DATA:  Altered mental status. Patient is currently being treated for a glial neoplasm with radiation therapy.  EXAM: CT HEAD WITHOUT CONTRAST TECHNIQUE: Contiguous axial images were obtained from the base of the skull through the vertex without intravenous contrast. COMPARISON:  CT 07/16/2017 and MRI 08/27/2017 FINDINGS: Brain: Acute intracranial hemorrhage or large vascular territory infarct. Extensive white matter changes from known glial tumor involving the left frontal and temporal lobes as well as left caudate and basal ganglia appear stable. No hydrocephalus. No extra-axial fluid. Midline fourth ventricle and basal cisterns with patency and without effacement. No cerebellar lesion is identified. Brainstem unremarkable. Vascular: No hyperdense vessel sign. Carotid calcifications are noted bilaterally. Skull: Left temporoparietal craniotomy change. No acute fracture or suspicious osseous lesions. Sinuses/Orbits: Left lens replacement. No acute paranasal sinus disease. Other: None IMPRESSION: Extensive supratentorial white matter change, more so on the left involving the left frontal and temporal lobes as well as the left caudate and basal ganglia consistent with known glial tumor. No significant change identified. Left-sided temporoparietal craniotomy. Electronically Signed   By: Ashley Royalty M.D.   On: 10/09/2017 00:06   Ct Angio Chest Pe W/cm &/or Wo Cm  Result Date: 10/09/2017 CLINICAL DATA:  Acute onset of altered mental status. Generalized abdominal pain. Patient on radiation therapy for glial neoplasm. EXAM: CT ANGIOGRAPHY CHEST CT ABDOMEN AND PELVIS WITH CONTRAST TECHNIQUE: Multidetector CT imaging of the chest was performed using the standard protocol during bolus  administration of intravenous contrast. Multiplanar CT image reconstructions and MIPs were obtained to evaluate the vascular anatomy. Multidetector CT imaging of the abdomen and pelvis was performed using the standard protocol during bolus administration of intravenous contrast. CONTRAST:  117mL ISOVUE-370 IOPAMIDOL (ISOVUE-370) INJECTION 76% COMPARISON:  CT of the abdomen and pelvis performed 09/02/2015, and CT of the chest performed 12/14/2011 FINDINGS: CTA CHEST FINDINGS Cardiovascular:  There is no evidence of pulmonary embolus. The heart is normal in size. The thoracic aorta is grossly unremarkable. The great vessels are within normal limits. Mediastinum/Nodes: The mediastinum is unremarkable in appearance. No mediastinal lymphadenopathy is seen. No pericardial effusion is identified. The left thyroid lobe is mildly heterogeneous but otherwise unremarkable. The right thyroid lobe is diminutive. No axillary lymphadenopathy is seen. A moderate hiatal hernia is noted. Lungs/Pleura: Bilateral atelectasis or scarring is noted. No pleural effusion or pneumothorax is seen. No masses are identified. Musculoskeletal: No acute osseous abnormalities are identified. The visualized musculature is unremarkable in appearance. Review of the MIP images confirms the above findings. CT ABDOMEN and PELVIS FINDINGS Hepatobiliary: The liver is unremarkable in appearance. The gallbladder is unremarkable in appearance. The common bile duct remains normal in caliber. Pancreas: The pancreas is within normal limits. Spleen: A few small hypodensities within the spleen may reflect small cysts. Adrenals/Urinary Tract: The adrenal glands are unremarkable in appearance. A small right renal cyst is noted. There is no evidence of hydronephrosis. No renal or ureteral stones are identified. No perinephric stranding is appreciated. Stomach/Bowel: The stomach is unremarkable in appearance. The small bowel is within normal limits. The appendix is  normal in caliber, without evidence of appendicitis. The colon is unremarkable in appearance. Mild wall thickening at the rectum could reflect mild chronic inflammation or proctitis. Vascular/Lymphatic: Scattered calcification is seen along the abdominal aorta and its branches. The abdominal aorta is otherwise grossly unremarkable. The inferior vena cava is grossly unremarkable. No retroperitoneal lymphadenopathy is seen. No pelvic sidewall lymphadenopathy is identified. Reproductive: The bladder is mildly distended and grossly unremarkable. The uterus is unremarkable in appearance. The ovaries are relatively symmetric. No suspicious adnexal masses are seen. Other: No  additional soft tissue abnormalities are seen. Musculoskeletal: No acute osseous abnormalities are identified. Multilevel vacuum phenomenon is noted along the lower thoracic and lumbar spine. The visualized musculature is unremarkable in appearance. Review of the MIP images confirms the above findings. IMPRESSION: 1. No evidence of pulmonary embolus. 2. No acute abnormality seen to explain the patient's symptoms. 3. Mild wall thickening at the rectum could reflect mild chronic inflammation or possibly proctitis. 4. Moderate hiatal hernia noted. 5. Bilateral atelectasis or scarring noted. Lungs otherwise clear. 6. Small right renal cyst noted. Aortic Atherosclerosis (ICD10-I70.0). Electronically Signed   By: Garald Balding M.D.   On: 10/09/2017 00:07   Ct Abdomen Pelvis W Contrast  Result Date: 10/09/2017 CLINICAL DATA:  Acute onset of altered mental status. Generalized abdominal pain. Patient on radiation therapy for glial neoplasm. EXAM: CT ANGIOGRAPHY CHEST CT ABDOMEN AND PELVIS WITH CONTRAST TECHNIQUE: Multidetector CT imaging of the chest was performed using the standard protocol during bolus administration of intravenous contrast. Multiplanar CT image reconstructions and MIPs were obtained to evaluate the vascular anatomy. Multidetector CT  imaging of the abdomen and pelvis was performed using the standard protocol during bolus administration of intravenous contrast. CONTRAST:  140mL ISOVUE-370 IOPAMIDOL (ISOVUE-370) INJECTION 76% COMPARISON:  CT of the abdomen and pelvis performed 09/02/2015, and CT of the chest performed 12/14/2011 FINDINGS: CTA CHEST FINDINGS Cardiovascular:  There is no evidence of pulmonary embolus. The heart is normal in size. The thoracic aorta is grossly unremarkable. The great vessels are within normal limits. Mediastinum/Nodes: The mediastinum is unremarkable in appearance. No mediastinal lymphadenopathy is seen. No pericardial effusion is identified. The left thyroid lobe is mildly heterogeneous but otherwise unremarkable. The right thyroid lobe is diminutive. No axillary lymphadenopathy is seen. A moderate hiatal hernia is noted. Lungs/Pleura: Bilateral atelectasis or scarring is noted. No pleural effusion or pneumothorax is seen. No masses are identified. Musculoskeletal: No acute osseous abnormalities are identified. The visualized musculature is unremarkable in appearance. Review of the MIP images confirms the above findings. CT ABDOMEN and PELVIS FINDINGS Hepatobiliary: The liver is unremarkable in appearance. The gallbladder is unremarkable in appearance. The common bile duct remains normal in caliber. Pancreas: The pancreas is within normal limits. Spleen: A few small hypodensities within the spleen may reflect small cysts. Adrenals/Urinary Tract: The adrenal glands are unremarkable in appearance. A small right renal cyst is noted. There is no evidence of hydronephrosis. No renal or ureteral stones are identified. No perinephric stranding is appreciated. Stomach/Bowel: The stomach is unremarkable in appearance. The small bowel is within normal limits. The appendix is normal in caliber, without evidence of appendicitis. The colon is unremarkable in appearance. Mild wall thickening at the rectum could reflect mild  chronic inflammation or proctitis. Vascular/Lymphatic: Scattered calcification is seen along the abdominal aorta and its branches. The abdominal aorta is otherwise grossly unremarkable. The inferior vena cava is grossly unremarkable. No retroperitoneal lymphadenopathy is seen. No pelvic sidewall lymphadenopathy is identified. Reproductive: The bladder is mildly distended and grossly unremarkable. The uterus is unremarkable in appearance. The ovaries are relatively symmetric. No suspicious adnexal masses are seen. Other: No additional soft tissue abnormalities are seen. Musculoskeletal: No acute osseous abnormalities are identified. Multilevel vacuum phenomenon is noted along the lower thoracic and lumbar spine. The visualized musculature is unremarkable in appearance. Review of the MIP images confirms the above findings. IMPRESSION: 1. No evidence of pulmonary embolus. 2. No acute abnormality seen to explain the patient's symptoms. 3. Mild wall thickening at the rectum could reflect  mild chronic inflammation or possibly proctitis. 4. Moderate hiatal hernia noted. 5. Bilateral atelectasis or scarring noted. Lungs otherwise clear. 6. Small right renal cyst noted. Aortic Atherosclerosis (ICD10-I70.0). Electronically Signed   By: Garald Balding M.D.   On: 10/09/2017 00:07    EKG: Independently reviewed.  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 Acute metabolic encephalopathy - 1. Possibly just dehydration 2. Continue IVF 3. Further work up planned if patient doesn't continue to improve. 2. Gliosarcoma - 1. Currently on chemo 2. Worsening on recent images 3. Has been recommended for pal care 4. But still full code at this time 3. HTN - continue home meds 4. DM2 - 1. Hold metformin 2. Sensitive SSI AC 5. Swelling of R leg - 1. DVT US in  AM 2. CTA neg for PE  DVT prophylaxis: Lovenox Code Status: Full Family Communication: Daughter at bedside Disposition Plan: Home after admit Consults called: None Admission status: Place in [place], [facility] Pager [phone] Only works nights!  If 7AM-7PM, please contact the primary day team physician taking care of patient  [email or web address] Password [email or web address]  10/09/2017, 1:12 AM

## 2017-10-09 NOTE — Progress Notes (Signed)
Spoke to dtr Erinn-informed of resources for med [employer], also informed of medicare obs notice left in rm-voiced understanding. PT cons-await recc.

## 2017-10-09 NOTE — Progress Notes (Signed)
Right lower extremity venous duplex has been completed. Negative for DVT.  10/09/17 9:17 AM Carlos Levering RVT

## 2017-10-09 NOTE — Progress Notes (Signed)
CSW received consult "may need help paying for medications." Case Manager aware, and will follow up with the patient.   Kathrin Greathouse, Marlinda Mike, MSW Clinical Social Worker  [phone] 10/09/2017  11:19 AM

## 2017-10-09 NOTE — ED Provider Notes (Signed)
[facility] DEPT Provider Note   CSN: [id number] Arrival date & time: 10/08/17  1826     History   Chief Complaint Chief Complaint  Patient presents with  . Altered Mental Status    HPI Kelly Maxwell is a 72 y.o. female.  History is obtained entirely from family.  Patient nonverbal here tonight.  Family states very similar events today that led to her admission on May 14 when she was admitted for dehydration and decreased mental status.  Patient has a history of gliosarcoma originally diagnosed in 2013 currently followed by hematology oncology here [facility].  Patient is beginning a new type of chemotherapy that will be every 6 weeks.  Most recently seen by them on July 18.  During that visit he describes her baseline mental status to be alert not a tentative oriented to self and environment language is impaired to fluency and comprehension.  Struggles with multiple step commands.  Very poor recall poor short-term memory.  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  Patient is also been visited by palliative care.  Patient is still a full code.  Patient's daughter states that sometimes she sleeps most of the day other day she is very alert and  and more active than she is today.  Stated that she p.o. intake both with liquids and food is been very decreased today they are concerned about dehydration.  Patient also currently finishing up a course of Keflex for urinary tract infection.     Past Medical History:  Diagnosis Date  . Cancer ([place])   . Depression   . GERD (gastroesophageal reflux disease)   . 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 Headache(784.0)   . Hiatal hernia   . History of radiation therapy 01/07/2012-02/18/12   left temporal  58GY  . HLD (hyperlipidemia)   . PONV (postoperative nausea  and vomiting)   . 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 Unsteady gait 09/19/2017  . Weakness generalized 09/19/2017  . Counseling regarding advanced care planning and goals of care 09/19/2017  . HTN (hypertension) 07/19/2017  . Acute metabolic encephalopathy 62/26/3335  . Dehydration 07/16/2017  . 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 Anxiety and depression 09/03/2015  . Personal history of other infectious and parasitic diseases 09/03/2015  . 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 HLD (hyperlipidemia)   . GERD (gastroesophageal reflux disease)   . PONV (postoperative nausea and vomiting)   . 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 Hiatal hernia     Past Surgical History:  Procedure Laterality Date  . BUNIONECTOMY  2001   right  . CATARACT EXTRACTION Left   . CRANIOTOMY  12/17/2011   Procedure: CRANIOTOMY TUMOR EXCISION;  Surgeon: Otilio Connors, MD;  Location: [facility];  Service: Neurosurgery;  Laterality: Left;  LEFT temporal craniotomy with stealth for tumor resection  . DILATION AND CURETTAGE OF UTERUS  2007  . TOTAL KNEE ARTHROPLASTY  2000   left  . VENTRAL HERNIA REPAIR N/A 09/03/2015   Procedure: LAPAROSCOPIC repair of incarcerated spigelian hernia with mesh, reduction and repair ;  Surgeon: [name], MD;  Location: [facility];  Service: General;  Laterality: N/A;     OB History   None      Home Medications    Prior to Admission medications   Medication Sig Start Date End Date Taking? Authorizing Provider  amLODipine (NORVASC) 5 MG tablet Take 1 tablet (5 mg total) by mouth daily. 08/26/17  Yes Vaslow, Acey Lav, MD  amphetamine-dextroamphetamine (ADDERALL) 30 MG tablet Take 1 tablet by mouth daily. 09/23/17  Yes Vaslow, Acey Lav, MD  atorvastatin (LIPITOR) 40 MG tablet  Take 40 mg by mouth 2 (two) times daily.    Yes [provider]  Bromfenac Sodium (PROLENSA) 0.07 % SOLN Place 1 drop into the right eye 3 (three) times daily. Reported on 09/02/2015   Yes [provider]  cephALEXin (KEFLEX) 500 MG capsule Take 500 mg by mouth 2 (two) times daily. 10/01/17  Yes [provider]  cholecalciferol (VITAMIN D) 1000 units tablet Take 1 tablet (1,000 Units total) by mouth daily. 06/20/17  Yes Vaslow, Acey Lav, MD  dexamethasone (DECADRON) 2 MG tablet Take 1 tablet (2 mg total) by mouth daily. 08/09/17  Yes Vaslow, Acey Lav, MD  Diaper Rash Products ([employer] DIAPER) 1-10 % CREA APPLY EXTERNALLY AS DIRECTED AS NEEDED WITH EACH [facility] CHANGE 09/06/17  Yes [provider]  Difluprednate (DUREZOL) 0.05 % EMUL Place 1 drop into the left eye 4 (four) times daily.    Yes [provider]  Diphenhyd-Hydrocort-Nystatin (FIRST-DUKES MOUTHWASH MT) Take 5 mLs by mouth every 5 (five) hours as needed for mouth pain. 09/10/17  Yes [provider]  GLEOSTINE 10 MG capsule Take 10 mg by mouth daily. 09/26/17  Yes [provider]  GLEOSTINE 100 MG capsule Take 100 mg by mouth daily. 09/26/17  Yes [provider]  GLEOSTINE 40 MG capsule Take 40 mg by mouth daily. 09/26/17  Yes [provider]  lacosamide (VIMPAT) 50 MG TABS tablet Take 1 tablet (50 mg total) by mouth 2 (two) times daily. 06/24/17  Yes Vaslow, Acey Lav, MD  mebendazole (VERMOX) 100 MG chewable tablet Chew 100 mg by mouth daily.    Yes [provider]  Melatonin 5 MG CAPS Take 10-20 mg by mouth at bedtime as needed (sleep).    Yes [provider]  metFORMIN (GLUCOPHAGE) 500 MG tablet Take by mouth 2 (two) times daily with a meal.   Yes [provider]  NON FORMULARY Take 1 capsule by mouth 2 (two) times daily. Jennye Moccasin CBD Oil    Yes [provider]  nystatin cream (MYCOSTATIN) Apply 1 application topically as needed.  08/21/17  Yes [provider]  pantoprazole (PROTONIX) 20 MG tablet Take 20 mg by mouth daily. 09/10/17  Yes [provider]  pantoprazole (PROTONIX) 40 MG tablet Take 1 tablet (40 mg total) by mouth daily. 08/09/17  Yes Vaslow, Acey Lav, MD  senna-docusate (SENOKOT-S) 8.6-50 MG tablet Take 1 tablet by mouth at bedtime as needed for mild constipation. 07/19/17  Yes Eugenie Filler, MD  Steva Colder Extract POWD 1 packet by Does not apply route.    [provider]  clonazePAM (KLONOPIN) 0.5 MG tablet Take 1 tablet (0.5 mg total) by mouth at bedtime as needed for anxiety. 07/19/17   Eugenie Filler, MD  ondansetron (ZOFRAN) 8 MG tablet Take 1 tablets (8mg ) by mouth once 30-60 prior to lomustine and Q 8h prn N/V 09/25/17   Ventura Sellers, MD    Family History Family  History  Problem Relation Age of Onset  . Heart disease Mother     Social History Social History   Tobacco Use  . Smoking status: Never Smoker  . Smokeless tobacco: Never Used  Substance Use Topics  . Alcohol use: No  . Drug use: No     Allergies   Ciprofloxacin; Codeine; Succinylcholine chloride; and Compazine [prochlorperazine edisylate]   Review of Systems Review of Systems  Unable to perform ROS: Mental status change     Physical Exam Updated Vital Signs BP 126/82 (BP Location: Right Arm)   Pulse 73   Temp 97.7 F (36.5 C) (Oral)   Resp 16   Ht 1.651 m (5\' 5" )   Wt 65.8 kg (145 lb)   SpO2 94%   BMI 24.13 kg/m   Physical Exam  Constitutional: She appears well-developed and well-nourished. No distress.  HENT:  Head: Normocephalic and atraumatic.  Mucous membranes dry  Eyes: Conjunctivae are normal.  Some left eye ptosis reportedly chronic.  Neck: Neck supple.  Cardiovascular: Normal rate and regular rhythm.  Pulmonary/Chest: Effort normal and breath sounds normal. No respiratory distress.  Abdominal: Soft. Bowel sounds are normal. She exhibits distension. There  is no tenderness.  Musculoskeletal: Normal range of motion. She exhibits edema.  Significant swelling to the right leg no erythema vertically compared to the left leg.  Nontender to palpation  Neurological:  Patient is awake nonverbal does look around.  Will spontaneously move upper extremities and lower extremities but does not follow commands well.  Skin: Skin is warm.  Nursing note and vitals reviewed.    ED Treatments / Results  Labs (all labs ordered are listed, but only abnormal results are displayed) Labs Reviewed  COMPREHENSIVE METABOLIC PANEL - Abnormal; Notable for the following components:      Result Value   Creatinine, Ser 0.31 (*)    Total Protein 5.9 (*)    Albumin 3.2 (*)    All other components within normal limits  URINALYSIS, ROUTINE W REFLEX MICROSCOPIC - Abnormal; Notable for the following components:   Ketones, ur 5 (*)    All other components within normal limits  D-DIMER, QUANTITATIVE (NOT AT [facility]) - Abnormal; Notable for the following components:   D-Dimer, Quant 0.68 (*)    All other components within normal limits  CBC WITH DIFFERENTIAL/PLATELET - Abnormal; Notable for the following components:   RBC 3.80 (*)    Hemoglobin 11.5 (*)    RDW 18.9 (*)    Lymphs Abs 0.4 (*)    All other components within normal limits  I-STAT CHEM 8, ED - Abnormal; Notable for the following components:   Creatinine, Ser 0.30 (*)    Hemoglobin 11.6 (*)    HCT 34.0 (*)    All other components within normal limits  LIPASE, BLOOD  TROPONIN I  CBC WITH DIFFERENTIAL/PLATELET    EKG EKG Interpretation  Date/Time:  Tuesday October 08 2017 21:30:19 EDT Ventricular Rate:  79 PR Interval:    QRS Duration: 67 QT Interval:  401 QTC Calculation: 460 R Axis:   7 Text Interpretation:  Sinus rhythm Anterior infarct, old No significant change since last tracing Confirmed by Fredia Sorrow [phone]) on 10/08/2017 9:38:14 PM   Radiology Dg Chest 2 View  Result Date:  10/08/2017 CLINICAL DATA:  Acute onset of altered mental status. Dehydration. EXAM: CHEST - 2 VIEW COMPARISON:  Chest radiograph performed 07/16/2017 FINDINGS: The lungs are hypoexpanded. Mild bibasilar airspace opacities likely reflect atelectasis. No pleural effusion or  pneumothorax is seen. The heart is normal in size. No acute osseous abnormalities are identified. IMPRESSION: Lungs hypoexpanded. Mild bibasilar airspace opacities likely reflect atelectasis. Electronically Signed   By: Garald Balding M.D.   On: 10/08/2017 22:20   Ct Head Wo Contrast  Result Date: 10/09/2017 CLINICAL DATA:  Altered mental status. Patient is currently being treated for a glial neoplasm with radiation therapy. EXAM: CT HEAD WITHOUT CONTRAST TECHNIQUE: Contiguous axial images were obtained from the base of the skull through the vertex without intravenous contrast. COMPARISON:  CT 07/16/2017 and MRI 08/27/2017 FINDINGS: Brain: Acute intracranial hemorrhage or large vascular territory infarct. Extensive white matter changes from known glial tumor involving the left frontal and temporal lobes as well as left caudate and basal ganglia appear stable. No hydrocephalus. No extra-axial fluid. Midline fourth ventricle and basal cisterns with patency and without effacement. No cerebellar lesion is identified. Brainstem unremarkable. Vascular: No hyperdense vessel sign. Carotid calcifications are noted bilaterally. Skull: Left temporoparietal craniotomy change. No acute fracture or suspicious osseous lesions. Sinuses/Orbits: Left lens replacement. No acute paranasal sinus disease. Other: None IMPRESSION: Extensive supratentorial white matter change, more so on the left involving the left frontal and temporal lobes as well as the left caudate and basal ganglia consistent with known glial tumor. No significant change identified. Left-sided temporoparietal craniotomy. Electronically Signed   By: Ashley Royalty M.D.   On: 10/09/2017 00:06   Ct  Angio Chest Pe W/cm &/or Wo Cm  Result Date: 10/09/2017 CLINICAL DATA:  Acute onset of altered mental status. Generalized abdominal pain. Patient on radiation therapy for glial neoplasm. EXAM: CT ANGIOGRAPHY CHEST CT ABDOMEN AND PELVIS WITH CONTRAST TECHNIQUE: Multidetector CT imaging of the chest was performed using the standard protocol during bolus administration of intravenous contrast. Multiplanar CT image reconstructions and MIPs were obtained to evaluate the vascular anatomy. Multidetector CT imaging of the abdomen and pelvis was performed using the standard protocol during bolus administration of intravenous contrast. CONTRAST:  127mL ISOVUE-370 IOPAMIDOL (ISOVUE-370) INJECTION 76% COMPARISON:  CT of the abdomen and pelvis performed 09/02/2015, and CT of the chest performed 12/14/2011 FINDINGS: CTA CHEST FINDINGS Cardiovascular:  There is no evidence of pulmonary embolus. The heart is normal in size. The thoracic aorta is grossly unremarkable. The great vessels are within normal limits. Mediastinum/Nodes: The mediastinum is unremarkable in appearance. No mediastinal lymphadenopathy is seen. No pericardial effusion is identified. The left thyroid lobe is mildly heterogeneous but otherwise unremarkable. The right thyroid lobe is diminutive. No axillary lymphadenopathy is seen. A moderate hiatal hernia is noted. Lungs/Pleura: Bilateral atelectasis or scarring is noted. No pleural effusion or pneumothorax is seen. No masses are identified. Musculoskeletal: No acute osseous abnormalities are identified. The visualized musculature is unremarkable in appearance. Review of the MIP images confirms the above findings. CT ABDOMEN and PELVIS FINDINGS Hepatobiliary: The liver is unremarkable in appearance. The gallbladder is unremarkable in appearance. The common bile duct remains normal in caliber. Pancreas: The pancreas is within normal limits. Spleen: A few small hypodensities within the spleen may reflect small  cysts. Adrenals/Urinary Tract: The adrenal glands are unremarkable in appearance. A small right renal cyst is noted. There is no evidence of hydronephrosis. No renal or ureteral stones are identified. No perinephric stranding is appreciated. Stomach/Bowel: The stomach is unremarkable in appearance. The small bowel is within normal limits. The appendix is normal in caliber, without evidence of appendicitis. The colon is unremarkable in appearance. Mild wall thickening at the rectum could reflect mild chronic inflammation  or proctitis. Vascular/Lymphatic: Scattered calcification is seen along the abdominal aorta and its branches. The abdominal aorta is otherwise grossly unremarkable. The inferior vena cava is grossly unremarkable. No retroperitoneal lymphadenopathy is seen. No pelvic sidewall lymphadenopathy is identified. Reproductive: The bladder is mildly distended and grossly unremarkable. The uterus is unremarkable in appearance. The ovaries are relatively symmetric. No suspicious adnexal masses are seen. Other: No additional soft tissue abnormalities are seen. Musculoskeletal: No acute osseous abnormalities are identified. Multilevel vacuum phenomenon is noted along the lower thoracic and lumbar spine. The visualized musculature is unremarkable in appearance. Review of the MIP images confirms the above findings. IMPRESSION: 1. No evidence of pulmonary embolus. 2. No acute abnormality seen to explain the patient's symptoms. 3. Mild wall thickening at the rectum could reflect mild chronic inflammation or possibly proctitis. 4. Moderate hiatal hernia noted. 5. Bilateral atelectasis or scarring noted. Lungs otherwise clear. 6. Small right renal cyst noted. Aortic Atherosclerosis (ICD10-I70.0). Electronically Signed   By: Garald Balding M.D.   On: 10/09/2017 00:07   Ct Abdomen Pelvis W Contrast  Result Date: 10/09/2017 CLINICAL DATA:  Acute onset of altered mental status. Generalized abdominal pain. Patient on  radiation therapy for glial neoplasm. EXAM: CT ANGIOGRAPHY CHEST CT ABDOMEN AND PELVIS WITH CONTRAST TECHNIQUE: Multidetector CT imaging of the chest was performed using the standard protocol during bolus administration of intravenous contrast. Multiplanar CT image reconstructions and MIPs were obtained to evaluate the vascular anatomy. Multidetector CT imaging of the abdomen and pelvis was performed using the standard protocol during bolus administration of intravenous contrast. CONTRAST:  116mL ISOVUE-370 IOPAMIDOL (ISOVUE-370) INJECTION 76% COMPARISON:  CT of the abdomen and pelvis performed 09/02/2015, and CT of the chest performed 12/14/2011 FINDINGS: CTA CHEST FINDINGS Cardiovascular:  There is no evidence of pulmonary embolus. The heart is normal in size. The thoracic aorta is grossly unremarkable. The great vessels are within normal limits. Mediastinum/Nodes: The mediastinum is unremarkable in appearance. No mediastinal lymphadenopathy is seen. No pericardial effusion is identified. The left thyroid lobe is mildly heterogeneous but otherwise unremarkable. The right thyroid lobe is diminutive. No axillary lymphadenopathy is seen. A moderate hiatal hernia is noted. Lungs/Pleura: Bilateral atelectasis or scarring is noted. No pleural effusion or pneumothorax is seen. No masses are identified. Musculoskeletal: No acute osseous abnormalities are identified. The visualized musculature is unremarkable in appearance. Review of the MIP images confirms the above findings. CT ABDOMEN and PELVIS FINDINGS Hepatobiliary: The liver is unremarkable in appearance. The gallbladder is unremarkable in appearance. The common bile duct remains normal in caliber. Pancreas: The pancreas is within normal limits. Spleen: A few small hypodensities within the spleen may reflect small cysts. Adrenals/Urinary Tract: The adrenal glands are unremarkable in appearance. A small right renal cyst is noted. There is no evidence of  hydronephrosis. No renal or ureteral stones are identified. No perinephric stranding is appreciated. Stomach/Bowel: The stomach is unremarkable in appearance. The small bowel is within normal limits. The appendix is normal in caliber, without evidence of appendicitis. The colon is unremarkable in appearance. Mild wall thickening at the rectum could reflect mild chronic inflammation or proctitis. Vascular/Lymphatic: Scattered calcification is seen along the abdominal aorta and its branches. The abdominal aorta is otherwise grossly unremarkable. The inferior vena cava is grossly unremarkable. No retroperitoneal lymphadenopathy is seen. No pelvic sidewall lymphadenopathy is identified. Reproductive: The bladder is mildly distended and grossly unremarkable. The uterus is unremarkable in appearance. The ovaries are relatively symmetric. No suspicious adnexal masses are seen. Other: No  additional soft tissue abnormalities are seen. Musculoskeletal: No acute osseous abnormalities are identified. Multilevel vacuum phenomenon is noted along the lower thoracic and lumbar spine. The visualized musculature is unremarkable in appearance. Review of the MIP images confirms the above findings. IMPRESSION: 1. No evidence of pulmonary embolus. 2. No acute abnormality seen to explain the patient's symptoms. 3. Mild wall thickening at the rectum could reflect mild chronic inflammation or possibly proctitis. 4. Moderate hiatal hernia noted. 5. Bilateral atelectasis or scarring noted. Lungs otherwise clear. 6. Small right renal cyst noted. Aortic Atherosclerosis (ICD10-I70.0). Electronically Signed   By: Garald Balding M.D.   On: 10/09/2017 00:07    Procedures Procedures (including critical care time)  Medications Ordered in ED Medications  0.9 %  sodium chloride infusion ( Intravenous New Bag/Given 10/08/17 2009)  lacosamide (VIMPAT) tablet 50 mg (50 mg Oral Given 10/08/17 2259)  iopamidol (ISOVUE-370) 76 % injection (has no  administration in time range)  sodium chloride 0.9 % bolus 500 mL (0 mLs Intravenous Stopped 10/08/17 2039)  iopamidol (ISOVUE-370) 76 % injection 100 mL (100 mLs Intravenous Contrast Given 10/08/17 2324)     Initial Impression / Assessment and Plan / ED Course  I have reviewed the triage vital signs and the nursing notes.  Pertinent labs & imaging results that were available during my care of the patient were reviewed by me and considered in my medical decision making (see chart for details).     Work-up here without any significant findings however patient's mental status decreased and clinical dehydration very suggestive of her admission on May 14.  At that time they thought maybe Cipro was playing a role and resulting in an acute metabolic encephalopathy patient was treated for urinary tract infection at that time.  Coincidentally patient is currently being treated with Keflex just finished up today for urinary tract infection.  Today's urine is negative.  Extensive work-up to include CT head chest angios and CT abdomen without any acute findings.  CT angios chest negative for pneumonia or pulmonary embolus.  The concern there was due to the new right leg swelling that there could have been a pulmonary embolus.  However DVT of the right leg is not been ruled out.  Patient with some improvement noted here just with IV fluids.  Patient still nonverbal but is looking around and a little bit better color in the face.  Patient nontoxic no acute distress labs without significant abnormalities.  Discussed with hospitalist they will admit for IV hydration.  And they will get Doppler studies of the right leg to rule out DVT.  CT head was baseline does not show any new or acute changes.  Final Clinical Impressions(s) / ED Diagnoses   Final diagnoses:  Altered mental status, unspecified altered mental status type  Dehydration    ED Discharge Orders    None       Fredia Sorrow, MD 10/09/17  [phone]

## 2017-10-09 NOTE — Care Management Obs Status (Signed)
[place] NOTIFICATION   Patient Details  Name: Kelly Maxwell MRN: [id number] Date of Birth: 08-29-1945   Medicare Observation Status Notification Given:       Dessa Phi, RN 10/09/2017, 2:33 PM

## 2017-10-09 NOTE — Progress Notes (Signed)
Hypoglycemic Event  CBG: 64 at 0751  Treatment: 15 GM carbohydrate snack  Symptoms: None  Follow-up CBG: Time: 0810 CBG Result: 74  Possible Reasons for Event: Inadequate meal intake  Comments/MD notified: Dr. Ree Kida notified.  Patient given juice as well as has breakfast tray that she is currently eating    Kelly Maxwell

## 2017-10-09 NOTE — Progress Notes (Signed)
ED TO INPATIENT HANDOFF REPORT  Name/Age/Gender Kelly Maxwell 72 y.o. female  Code Status    Code Status Orders  (From admission, onward)        Start     Ordered   10/09/17 0111  Full code  Continuous     10/09/17 0111    Code Status History    Date Active Date Inactive Code Status Order ID Comments User Context   07/16/2017 2238 07/19/2017 2154 Full Code [id number]  [name], DO Inpatient   09/03/2015 4599 09/06/2015 1440 Full Code [id number]  [name], MD Inpatient      Home/SNF/Other Home  Chief Complaint Dehydration  Level of Care/Admitting Diagnosis ED Disposition    ED Disposition Condition [place]: [facility] [[id number]]  Level of Care: Med-Surg [16]  Diagnosis: Acute metabolic encephalopathy [4356861]  Admitting Physician: [name] [[id number]]  Attending Physician: [name] [[id number]]  PT Class (Do Not Modify): Observation [104]  PT Acc Code (Do Not Modify): Observation [10022]       Medical History Past Medical History:  Diagnosis Date  . Cancer ([place])   . Depression   . GERD (gastroesophageal reflux disease)   . 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 Headache(784.0)   . Hiatal hernia   . History of radiation therapy 01/07/2012-02/18/12   left temporal  58GY  . HLD (hyperlipidemia)   . PONV (postoperative nausea and vomiting)   . 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 Ciprofloxacin     AMS  . Codeine Nausea And Vomiting and Nausea Only  . 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    . Compazine [Prochlorperazine Edisylate] Other (See Comments) and Rash    Became hyper Became hyper     IV Location/Drains/Wounds Patient Lines/Drains/Airways Status   Active Line/Drains/Airways    Name:   Placement date:   Placement time:   Site:   Days:   Peripheral IV 10/08/17 Left Hand   10/08/17    1902    Hand   1   Peripheral IV 10/08/17 Right Antecubital   10/08/17    2259    Antecubital   1   Incision (Closed) 09/03/15 Abdomen   09/03/15    0353     767   Incision - 3 Ports Abdomen 1: Right;Lower 2: Right;Mid 3: Right;Upper   09/03/15    0352     767          Labs/Imaging Results for orders placed or performed during the hospital encounter of 10/08/17 (from the past 48 hour(s))  Urinalysis, Routine w reflex microscopic     Status: Abnormal   Collection Time: 10/08/17  7:31 PM  Result Value Ref Range   Color, Urine YELLOW YELLOW   APPearance CLEAR CLEAR   Specific Gravity, Urine 1.016 1.005 - 1.030   pH 6.0 5.0 - 8.0   Glucose, UA NEGATIVE NEGATIVE mg/dL   Hgb urine dipstick NEGATIVE NEGATIVE   Bilirubin Urine NEGATIVE NEGATIVE   Ketones, ur 5 (A) NEGATIVE mg/dL   Protein, ur NEGATIVE NEGATIVE mg/dL   Nitrite NEGATIVE NEGATIVE   Leukocytes, UA NEGATIVE NEGATIVE    Comment: Performed at [facility]  [place] [address] [place], [place] [address]  Comprehensive metabolic panel     Status: Abnormal   Collection Time: 10/08/17  9:19 PM  Result Value Ref Range   Sodium 139 135 - 145 mmol/L   Potassium 3.5 3.5 - 5.1 mmol/L   Chloride 107 98 - 111 mmol/L   CO2 22 22 - 32 mmol/L   Glucose, Bld 93 70 - 99 mg/dL   BUN 12 8 - 23 mg/dL   Creatinine, Ser 0.31 (L) 0.44 - 1.00 mg/dL   Calcium 8.9 8.9 - 10.3 mg/dL   Total Protein 5.9 (L) 6.5 - 8.1 g/dL   Albumin 3.2 (L) 3.5 - 5.0 g/dL   AST 30 15 - 41 U/L   ALT 31 0 - 44 U/L   Alkaline Phosphatase 62 38 - 126 U/L   Total Bilirubin 0.5 0.3 - 1.2 mg/dL   GFR calc non Af Amer >60 >60 mL/min   GFR calc Af Amer >60 >60 mL/min    Comment: (NOTE) The eGFR has been calculated using the CKD EPI equation. This  calculation has not been validated in all clinical situations. eGFR's persistently <60 mL/min signify possible Chronic Kidney Disease.    Anion gap 10 5 - 15    Comment: Performed at [facility], [place] [address], [place], [place] [address]  Lipase, blood     Status: None   Collection Time: 10/08/17  9:19 PM  Result Value Ref Range   Lipase 44 11 - 51 U/L    Comment: Performed at [facility], [place] [address] [place], [place] [address]  D-dimer, quantitative (not at [facility])     Status: Abnormal   Collection Time: 10/08/17  9:19 PM  Result Value Ref Range   D-Dimer, Quant 0.68 (H) 0.00 - 0.50 ug/mL-FEU    Comment: (NOTE) At the manufacturer cut-off of 0.50 ug/mL FEU, this assay has been documented to exclude PE with a sensitivity and negative predictive value of 97 to 99%.  At this time, this assay has not been approved by the FDA to exclude DVT/VTE. Results should be correlated with clinical presentation. Performed at [facility], [place] [address], [place],  [address]   Troponin I     Status: None   Collection Time: 10/08/17  9:19 PM  Result Value Ref Range   Troponin I <0.03 <0.03 ng/mL    Comment: Performed at [facility], [place] [address] [place],  [address]  I-stat Chem 8, ED     Status: Abnormal   Collection Time: 10/08/17  9:30 PM  Result Value Ref Range   Sodium 137 135 - 145 mmol/L   Potassium 3.6 3.5 - 5.1 mmol/L   Chloride 105 98 - 111 mmol/L   BUN 11 8 - 23 mg/dL   Creatinine, Ser 0.30 (L) 0.44 - 1.00 mg/dL   Glucose, Bld 91 70 - 99 mg/dL   Calcium, Ion 1.16 1.15 - 1.40 mmol/L   TCO2 22 22 - 32 mmol/L   Hemoglobin 11.6 (L) 12.0 - 15.0 g/dL   HCT 34.0 (L) 36.0 - 46.0 %  CBC with Differential/Platelet     Status: Abnormal   Collection Time: 10/08/17  9:47 PM  Result Value Ref Range   WBC 8.1 4.0 - 10.5 K/uL   RBC 3.80 (L) 3.87 - 5.11 MIL/uL   Hemoglobin 11.5 (L) 12.0 - 15.0  g/dL   HCT 36.3 36.0 - 46.0 %  MCV 95.5 78.0 - 100.0 fL   MCH 30.3 26.0 - 34.0 pg   MCHC 31.7 30.0 - 36.0 g/dL   RDW 18.9 (H) 11.5 - 15.5 %   Platelets 235 150 - 400 K/uL   Neutrophils Relative % 91 %   Neutro Abs 7.4 1.7 - 7.7 K/uL   Lymphocytes Relative 6 %   Lymphs Abs 0.4 (L) 0.7 - 4.0 K/uL   Monocytes Relative 3 %   Monocytes Absolute 0.2 0.1 - 1.0 K/uL   Eosinophils Relative 0 %   Eosinophils Absolute 0.0 0.0 - 0.7 K/uL   Basophils Relative 0 %   Basophils Absolute 0.0 0.0 - 0.1 K/uL    Comment: Performed at [facility], [place] [address], [place], [place] [address]   Dg Chest 2 View  Result Date: 10/08/2017 CLINICAL DATA:  Acute onset of altered mental status. Dehydration. EXAM: CHEST - 2 VIEW COMPARISON:  Chest radiograph performed 07/16/2017 FINDINGS: The lungs are hypoexpanded. Mild bibasilar airspace opacities likely reflect atelectasis. No pleural effusion or pneumothorax is seen. The heart is normal in size. No acute osseous abnormalities are identified. IMPRESSION: Lungs hypoexpanded. Mild bibasilar airspace opacities likely reflect atelectasis. Electronically Signed   By: Garald Balding M.D.   On: 10/08/2017 22:20   Ct Head Wo Contrast  Result Date: 10/09/2017 CLINICAL DATA:  Altered mental status. Patient is currently being treated for a glial neoplasm with radiation therapy. EXAM: CT HEAD WITHOUT CONTRAST TECHNIQUE: Contiguous axial images were obtained from the base of the skull through the vertex without intravenous contrast. COMPARISON:  CT 07/16/2017 and MRI 08/27/2017 FINDINGS: Brain: Acute intracranial hemorrhage or large vascular territory infarct. Extensive white matter changes from known glial tumor involving the left frontal and temporal lobes as well as left caudate and basal ganglia appear stable. No hydrocephalus. No extra-axial fluid. Midline fourth ventricle and basal cisterns with patency and without effacement. No cerebellar lesion is  identified. Brainstem unremarkable. Vascular: No hyperdense vessel sign. Carotid calcifications are noted bilaterally. Skull: Left temporoparietal craniotomy change. No acute fracture or suspicious osseous lesions. Sinuses/Orbits: Left lens replacement. No acute paranasal sinus disease. Other: None IMPRESSION: Extensive supratentorial white matter change, more so on the left involving the left frontal and temporal lobes as well as the left caudate and basal ganglia consistent with known glial tumor. No significant change identified. Left-sided temporoparietal craniotomy. Electronically Signed   By: Ashley Royalty M.D.   On: 10/09/2017 00:06   Ct Angio Chest Pe W/cm &/or Wo Cm  Result Date: 10/09/2017 CLINICAL DATA:  Acute onset of altered mental status. Generalized abdominal pain. Patient on radiation therapy for glial neoplasm. EXAM: CT ANGIOGRAPHY CHEST CT ABDOMEN AND PELVIS WITH CONTRAST TECHNIQUE: Multidetector CT imaging of the chest was performed using the standard protocol during bolus administration of intravenous contrast. Multiplanar CT image reconstructions and MIPs were obtained to evaluate the vascular anatomy. Multidetector CT imaging of the abdomen and pelvis was performed using the standard protocol during bolus administration of intravenous contrast. CONTRAST:  155m ISOVUE-370 IOPAMIDOL (ISOVUE-370) INJECTION 76% COMPARISON:  CT of the abdomen and pelvis performed 09/02/2015, and CT of the chest performed 12/14/2011 FINDINGS: CTA CHEST FINDINGS Cardiovascular:  There is no evidence of pulmonary embolus. The heart is normal in size. The thoracic aorta is grossly unremarkable. The great vessels are within normal limits. Mediastinum/Nodes: The mediastinum is unremarkable in appearance. No mediastinal lymphadenopathy is seen. No pericardial effusion is identified. The left thyroid lobe is mildly heterogeneous but otherwise  unremarkable. The right thyroid lobe is diminutive. No axillary lymphadenopathy  is seen. A moderate hiatal hernia is noted. Lungs/Pleura: Bilateral atelectasis or scarring is noted. No pleural effusion or pneumothorax is seen. No masses are identified. Musculoskeletal: No acute osseous abnormalities are identified. The visualized musculature is unremarkable in appearance. Review of the MIP images confirms the above findings. CT ABDOMEN and PELVIS FINDINGS Hepatobiliary: The liver is unremarkable in appearance. The gallbladder is unremarkable in appearance. The common bile duct remains normal in caliber. Pancreas: The pancreas is within normal limits. Spleen: A few small hypodensities within the spleen may reflect small cysts. Adrenals/Urinary Tract: The adrenal glands are unremarkable in appearance. A small right renal cyst is noted. There is no evidence of hydronephrosis. No renal or ureteral stones are identified. No perinephric stranding is appreciated. Stomach/Bowel: The stomach is unremarkable in appearance. The small bowel is within normal limits. The appendix is normal in caliber, without evidence of appendicitis. The colon is unremarkable in appearance. Mild wall thickening at the rectum could reflect mild chronic inflammation or proctitis. Vascular/Lymphatic: Scattered calcification is seen along the abdominal aorta and its branches. The abdominal aorta is otherwise grossly unremarkable. The inferior vena cava is grossly unremarkable. No retroperitoneal lymphadenopathy is seen. No pelvic sidewall lymphadenopathy is identified. Reproductive: The bladder is mildly distended and grossly unremarkable. The uterus is unremarkable in appearance. The ovaries are relatively symmetric. No suspicious adnexal masses are seen. Other: No additional soft tissue abnormalities are seen. Musculoskeletal: No acute osseous abnormalities are identified. Multilevel vacuum phenomenon is noted along the lower thoracic and lumbar spine. The visualized musculature is unremarkable in appearance. Review of the  MIP images confirms the above findings. IMPRESSION: 1. No evidence of pulmonary embolus. 2. No acute abnormality seen to explain the patient's symptoms. 3. Mild wall thickening at the rectum could reflect mild chronic inflammation or possibly proctitis. 4. Moderate hiatal hernia noted. 5. Bilateral atelectasis or scarring noted. Lungs otherwise clear. 6. Small right renal cyst noted. Aortic Atherosclerosis (ICD10-I70.0). Electronically Signed   By: Garald Balding M.D.   On: 10/09/2017 00:07   Ct Abdomen Pelvis W Contrast  Result Date: 10/09/2017 CLINICAL DATA:  Acute onset of altered mental status. Generalized abdominal pain. Patient on radiation therapy for glial neoplasm. EXAM: CT ANGIOGRAPHY CHEST CT ABDOMEN AND PELVIS WITH CONTRAST TECHNIQUE: Multidetector CT imaging of the chest was performed using the standard protocol during bolus administration of intravenous contrast. Multiplanar CT image reconstructions and MIPs were obtained to evaluate the vascular anatomy. Multidetector CT imaging of the abdomen and pelvis was performed using the standard protocol during bolus administration of intravenous contrast. CONTRAST:  170m ISOVUE-370 IOPAMIDOL (ISOVUE-370) INJECTION 76% COMPARISON:  CT of the abdomen and pelvis performed 09/02/2015, and CT of the chest performed 12/14/2011 FINDINGS: CTA CHEST FINDINGS Cardiovascular:  There is no evidence of pulmonary embolus. The heart is normal in size. The thoracic aorta is grossly unremarkable. The great vessels are within normal limits. Mediastinum/Nodes: The mediastinum is unremarkable in appearance. No mediastinal lymphadenopathy is seen. No pericardial effusion is identified. The left thyroid lobe is mildly heterogeneous but otherwise unremarkable. The right thyroid lobe is diminutive. No axillary lymphadenopathy is seen. A moderate hiatal hernia is noted. Lungs/Pleura: Bilateral atelectasis or scarring is noted. No pleural effusion or pneumothorax is seen. No  masses are identified. Musculoskeletal: No acute osseous abnormalities are identified. The visualized musculature is unremarkable in appearance. Review of the MIP images confirms the above findings. CT ABDOMEN and PELVIS FINDINGS Hepatobiliary: The  liver is unremarkable in appearance. The gallbladder is unremarkable in appearance. The common bile duct remains normal in caliber. Pancreas: The pancreas is within normal limits. Spleen: A few small hypodensities within the spleen may reflect small cysts. Adrenals/Urinary Tract: The adrenal glands are unremarkable in appearance. A small right renal cyst is noted. There is no evidence of hydronephrosis. No renal or ureteral stones are identified. No perinephric stranding is appreciated. Stomach/Bowel: The stomach is unremarkable in appearance. The small bowel is within normal limits. The appendix is normal in caliber, without evidence of appendicitis. The colon is unremarkable in appearance. Mild wall thickening at the rectum could reflect mild chronic inflammation or proctitis. Vascular/Lymphatic: Scattered calcification is seen along the abdominal aorta and its branches. The abdominal aorta is otherwise grossly unremarkable. The inferior vena cava is grossly unremarkable. No retroperitoneal lymphadenopathy is seen. No pelvic sidewall lymphadenopathy is identified. Reproductive: The bladder is mildly distended and grossly unremarkable. The uterus is unremarkable in appearance. The ovaries are relatively symmetric. No suspicious adnexal masses are seen. Other: No additional soft tissue abnormalities are seen. Musculoskeletal: No acute osseous abnormalities are identified. Multilevel vacuum phenomenon is noted along the lower thoracic and lumbar spine. The visualized musculature is unremarkable in appearance. Review of the MIP images confirms the above findings. IMPRESSION: 1. No evidence of pulmonary embolus. 2. No acute abnormality seen to explain the patient's symptoms.  3. Mild wall thickening at the rectum could reflect mild chronic inflammation or possibly proctitis. 4. Moderate hiatal hernia noted. 5. Bilateral atelectasis or scarring noted. Lungs otherwise clear. 6. Small right renal cyst noted. Aortic Atherosclerosis (ICD10-I70.0). Electronically Signed   By: Garald Balding M.D.   On: 10/09/2017 00:07    Pending Labs Unresulted Labs (From admission, onward)   Start     Ordered   10/09/17 2694  Basic metabolic panel  Tomorrow morning,   R     10/09/17 0111   10/08/17 1931  CBC with Differential/Platelet  Once,   R     10/08/17 1931      Vitals/Pain Today's Vitals   10/08/17 2230 10/09/17 0003 10/09/17 0030 10/09/17 0100  BP: 119/79 126/82 131/87 (!) 141/79  Pulse: 78 73 89 97  Resp: (!) 29 16 16 16   Temp:      TempSrc:      SpO2: (!) 89% 94% 95% 95%  Weight:      Height:      PainSc:        Isolation Precautions No active isolations  Medications Medications  0.9 %  sodium chloride infusion ( Intravenous Rate/Dose Change 10/09/17 0104)  lacosamide (VIMPAT) tablet 50 mg (50 mg Oral Given 10/08/17 2259)  iopamidol (ISOVUE-370) 76 % injection (has no administration in time range)  amphetamine-dextroamphetamine (ADDERALL) tablet 1 tablet (has no administration in time range)  amLODipine (NORVASC) tablet 5 mg (has no administration in time range)  atorvastatin (LIPITOR) tablet 40 mg (has no administration in time range)  dexamethasone (DECADRON) tablet 2 mg (has no administration in time range)  Difluprednate 0.05 % EMUL 1 drop (has no administration in time range)  pantoprazole (PROTONIX) EC tablet 40 mg (has no administration in time range)  senna-docusate (Senokot-S) tablet 1 tablet (has no administration in time range)  mebendazole (VERMOX) chewable tablet 100 mg (has no administration in time range)  clonazePAM (KLONOPIN) tablet 0.5 mg (has no administration in time range)  ketorolac (ACULAR) 0.5 % ophthalmic solution 1 drop (has no  administration in time range)  cholecalciferol (  VITAMIN D) tablet 1,000 Units (has no administration in time range)  acetaminophen (TYLENOL) tablet 650 mg (has no administration in time range)    Or  acetaminophen (TYLENOL) suppository 650 mg (has no administration in time range)  ondansetron (ZOFRAN) tablet 4 mg (has no administration in time range)    Or  ondansetron (ZOFRAN) injection 4 mg (has no administration in time range)  insulin aspart (novoLOG) injection 0-9 Units (has no administration in time range)  enoxaparin (LOVENOX) injection 40 mg (has no administration in time range)  sodium chloride 0.9 % bolus 500 mL (0 mLs Intravenous Stopped 10/08/17 2039)  iopamidol (ISOVUE-370) 76 % injection 100 mL (100 mLs Intravenous Contrast Given 10/08/17 2324)    Mobility walks

## 2017-10-09 NOTE — Progress Notes (Signed)
PROGRESS NOTE    Kelly Maxwell  RSW:[id number] DOB: 1945-09-28 DOA: 10/08/2017 PCP: Oncology, Physician, MD   Brief Narrative:  HPI on 10/09/2017 by Dr. Roland Rack is a 72 y.o. female with medical history significant of gliosarcoma, DM2.  Patient brought in to ED by family for AMS. Patient recently had admission in May for AMS which was determined to be due to a combination of dehydration and treatment of UTI with cipro. At baseline, patient is described as: alert but not attentative oriented to self and environment language is impaired to fluency and comprehension. Struggles with multiple step commands. Very poor recall poor short-term memory. 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 Patient is also been visited by palliative care. Patient is still a full code. Today patient was noted to be more sleepy and less active.  Not taking POs very well.  Family concerned for dehydration. Of note patient just finished Keflex today for treatment of UTI.  Interim history Admitted for acute metabolic encephalopathy and placed on IV hydration.  Encephalopathy does appear to be improving slowly. Assessment & Plan   Acute metabolic encephalopathy -Possibly secondary to dehydration -CXR and UA negative for infection -CT head showed white matter changes (noted below) -Currently afebrile with no leukocytosis -Patient currently alert and oriented to self however unknown baseline.  Upon review of patient's chart, during her last office visit with Dr. Mickeal Skinner, patient was alert to self and environment however was not attentive.  She had poor recall and short-term memory.  Language is impaired to do fluency and comprehension.  Gliosarcoma -Currently receiving chemotherapy -CT head showed extensive supratentorial white matter change, more on left, involving left frontal and temporal lobes as well as left caudate and basal ganglia consistent with known glial  tumor.  No significant change identified. -Patient follows with Dr. Mickeal Skinner.  Placed on Adderall for cancer related fatigue.  Last progress note 09/19/2017 did discuss patient's poor functional status -Continue Vimpat, Decadron -Of note, patient is followed by palliative care/[facility]  Essential Hypertension -Stable, continue amlodipine  Diabetes mellitus, type II -Metformin held -Continue insulin sliding scale  and CBG monitoring  Right lower extremity edema -CTA unremarkable for PE -Lower extremity doppler negative for DVT  DVT Prophylaxis  Lovenox  Code Status: Full  Family Communication: None at bedside  Disposition Plan: Currently in observation. Suspect home when medically improved.   Consultants None  Procedures  Right lower extremity doppler   Antibiotics   Anti-infectives (From admission, onward)   Start     Dose/Rate Route Frequency Ordered Stop   10/09/17 1000  mebendazole (VERMOX) chewable tablet 100 mg     100 mg Oral Daily 10/09/17 0045        Subjective:   Kelly Maxwell seen and examined today.  Patient only alert to self at this time.  Objective:   Vitals:   10/09/17 0030 10/09/17 0100 10/09/17 0242 10/09/17 0549  BP: 131/87 (!) 141/79 129/80 134/73  Pulse: 89 97 84 90  Resp: 16 16 20 20   Temp:   98.4 F (36.9 C) 97.6 F (36.4 C)  TempSrc:   Oral Oral  SpO2: 95% 95% 95% 97%  Weight:   65 kg (143 lb 4.8 oz)   Height:   5\' 4"  (1.626 m)     Intake/Output Summary (Last 24 hours) at 10/09/2017 1214 Last data filed at 10/09/2017 0310 Gross per 24 hour  Intake 1009.87 ml  Output -  Net 1009.87 ml   Filed Weights   10/08/17 1837 10/09/17 0242  Weight: 65.8 kg (145 lb) 65 kg (143 lb 4.8 oz)    Exam  General: Well developed, well nourished, NAD, appears stated age  HEENT: NCAT, mucous membranes moist.   Neck: Supple  Cardiovascular: S1 S2 auscultated, no rubs, murmurs or gallops. Regular rate and rhythm.  Respiratory: Clear to  auscultation bilaterally with equal chest rise  Abdomen: Soft, nontender, nondistended, + bowel sounds  Extremities: warm dry without cyanosis clubbing or edema  Neuro: AAOx1 (self only), does not follow commands.   Skin: Without rashes exudates or nodules  Psych: Cannot fully assess at this time, however pleasant    Data Reviewed: I have personally reviewed following labs and imaging studies  CBC: Recent Labs  Lab 10/08/17 2130 10/08/17 2147  WBC  --  8.1  NEUTROABS  --  7.4  HGB 11.6* 11.5*  HCT 34.0* 36.3  MCV  --  95.5  PLT  --  546   Basic Metabolic Panel: Recent Labs  Lab 10/08/17 2119 10/08/17 2130 10/09/17 0417  NA 139 137 138  K 3.5 3.6 3.6  CL 107 105 109  CO2 22  --  21*  GLUCOSE 93 91 79  BUN 12 11 9   CREATININE 0.31* 0.30* <0.30*  CALCIUM 8.9  --  8.5*   GFR: CrCl cannot be calculated (This lab value cannot be used to calculate CrCl because it is not a number: <0.30). Liver Function Tests: Recent Labs  Lab 10/08/17 2119  AST 30  ALT 31  ALKPHOS 62  BILITOT 0.5  PROT 5.9*  ALBUMIN 3.2*   Recent Labs  Lab 10/08/17 2119  LIPASE 44   No results for input(s): AMMONIA in the last 168 hours. Coagulation Profile: No results for input(s): INR, PROTIME in the last 168 hours. Cardiac Enzymes: Recent Labs  Lab 10/08/17 2119  TROPONINI <0.03   BNP (last 3 results) No results for input(s): PROBNP in the last 8760 hours. HbA1C: No results for input(s): HGBA1C in the last 72 hours. CBG: Recent Labs  Lab 10/09/17 0751 10/09/17 0811 10/09/17 1145  GLUCAP 64* 74 94   Lipid Profile: No results for input(s): CHOL, HDL, LDLCALC, TRIG, CHOLHDL, LDLDIRECT in the last 72 hours. Thyroid Function Tests: No results for input(s): TSH, T4TOTAL, FREET4, T3FREE, THYROIDAB in the last 72 hours. Anemia Panel: No results for input(s): VITAMINB12, FOLATE, FERRITIN, TIBC, IRON, RETICCTPCT in the last 72 hours. Urine analysis:    Component Value  Date/Time   COLORURINE YELLOW 10/08/2017 1931   APPEARANCEUR CLEAR 10/08/2017 1931   LABSPEC 1.016 10/08/2017 1931   PHURINE 6.0 10/08/2017 1931   GLUCOSEU NEGATIVE 10/08/2017 1931   HGBUR NEGATIVE 10/08/2017 1931   BILIRUBINUR NEGATIVE 10/08/2017 1931   KETONESUR 5 (A) 10/08/2017 1931   PROTEINUR NEGATIVE 10/08/2017 1931   NITRITE NEGATIVE 10/08/2017 1931   LEUKOCYTESUR NEGATIVE 10/08/2017 1931   Sepsis Labs: @LABRCNTIP (procalcitonin:4,lacticidven:4)  )No results found for this or any previous visit (from the past 240 hour(s)).    Radiology Studies: Dg Chest 2 View  Result Date: 10/08/2017 CLINICAL DATA:  Acute onset of altered mental status. Dehydration. EXAM: CHEST - 2 VIEW COMPARISON:  Chest radiograph performed 07/16/2017 FINDINGS: The lungs are hypoexpanded. Mild bibasilar airspace opacities likely reflect atelectasis. No pleural effusion or pneumothorax is seen. The heart is normal in size. No acute osseous abnormalities are identified. IMPRESSION: Lungs hypoexpanded. Mild bibasilar airspace opacities likely reflect atelectasis.  Electronically Signed   By: Garald Balding M.D.   On: 10/08/2017 22:20   Ct Head Wo Contrast  Result Date: 10/09/2017 CLINICAL DATA:  Altered mental status. Patient is currently being treated for a glial neoplasm with radiation therapy. EXAM: CT HEAD WITHOUT CONTRAST TECHNIQUE: Contiguous axial images were obtained from the base of the skull through the vertex without intravenous contrast. COMPARISON:  CT 07/16/2017 and MRI 08/27/2017 FINDINGS: Brain: Acute intracranial hemorrhage or large vascular territory infarct. Extensive white matter changes from known glial tumor involving the left frontal and temporal lobes as well as left caudate and basal ganglia appear stable. No hydrocephalus. No extra-axial fluid. Midline fourth ventricle and basal cisterns with patency and without effacement. No cerebellar lesion is identified. Brainstem unremarkable. Vascular: No  hyperdense vessel sign. Carotid calcifications are noted bilaterally. Skull: Left temporoparietal craniotomy change. No acute fracture or suspicious osseous lesions. Sinuses/Orbits: Left lens replacement. No acute paranasal sinus disease. Other: None IMPRESSION: Extensive supratentorial white matter change, more so on the left involving the left frontal and temporal lobes as well as the left caudate and basal ganglia consistent with known glial tumor. No significant change identified. Left-sided temporoparietal craniotomy. Electronically Signed   By: Ashley Royalty M.D.   On: 10/09/2017 00:06   Ct Angio Chest Pe W/cm &/or Wo Cm  Result Date: 10/09/2017 CLINICAL DATA:  Acute onset of altered mental status. Generalized abdominal pain. Patient on radiation therapy for glial neoplasm. EXAM: CT ANGIOGRAPHY CHEST CT ABDOMEN AND PELVIS WITH CONTRAST TECHNIQUE: Multidetector CT imaging of the chest was performed using the standard protocol during bolus administration of intravenous contrast. Multiplanar CT image reconstructions and MIPs were obtained to evaluate the vascular anatomy. Multidetector CT imaging of the abdomen and pelvis was performed using the standard protocol during bolus administration of intravenous contrast. CONTRAST:  11mL ISOVUE-370 IOPAMIDOL (ISOVUE-370) INJECTION 76% COMPARISON:  CT of the abdomen and pelvis performed 09/02/2015, and CT of the chest performed 12/14/2011 FINDINGS: CTA CHEST FINDINGS Cardiovascular:  There is no evidence of pulmonary embolus. The heart is normal in size. The thoracic aorta is grossly unremarkable. The great vessels are within normal limits. Mediastinum/Nodes: The mediastinum is unremarkable in appearance. No mediastinal lymphadenopathy is seen. No pericardial effusion is identified. The left thyroid lobe is mildly heterogeneous but otherwise unremarkable. The right thyroid lobe is diminutive. No axillary lymphadenopathy is seen. A moderate hiatal hernia is noted.  Lungs/Pleura: Bilateral atelectasis or scarring is noted. No pleural effusion or pneumothorax is seen. No masses are identified. Musculoskeletal: No acute osseous abnormalities are identified. The visualized musculature is unremarkable in appearance. Review of the MIP images confirms the above findings. CT ABDOMEN and PELVIS FINDINGS Hepatobiliary: The liver is unremarkable in appearance. The gallbladder is unremarkable in appearance. The common bile duct remains normal in caliber. Pancreas: The pancreas is within normal limits. Spleen: A few small hypodensities within the spleen may reflect small cysts. Adrenals/Urinary Tract: The adrenal glands are unremarkable in appearance. A small right renal cyst is noted. There is no evidence of hydronephrosis. No renal or ureteral stones are identified. No perinephric stranding is appreciated. Stomach/Bowel: The stomach is unremarkable in appearance. The small bowel is within normal limits. The appendix is normal in caliber, without evidence of appendicitis. The colon is unremarkable in appearance. Mild wall thickening at the rectum could reflect mild chronic inflammation or proctitis. Vascular/Lymphatic: Scattered calcification is seen along the abdominal aorta and its branches. The abdominal aorta is otherwise grossly unremarkable. The inferior vena cava  is grossly unremarkable. No retroperitoneal lymphadenopathy is seen. No pelvic sidewall lymphadenopathy is identified. Reproductive: The bladder is mildly distended and grossly unremarkable. The uterus is unremarkable in appearance. The ovaries are relatively symmetric. No suspicious adnexal masses are seen. Other: No additional soft tissue abnormalities are seen. Musculoskeletal: No acute osseous abnormalities are identified. Multilevel vacuum phenomenon is noted along the lower thoracic and lumbar spine. The visualized musculature is unremarkable in appearance. Review of the MIP images confirms the above findings.  IMPRESSION: 1. No evidence of pulmonary embolus. 2. No acute abnormality seen to explain the patient's symptoms. 3. Mild wall thickening at the rectum could reflect mild chronic inflammation or possibly proctitis. 4. Moderate hiatal hernia noted. 5. Bilateral atelectasis or scarring noted. Lungs otherwise clear. 6. Small right renal cyst noted. Aortic Atherosclerosis (ICD10-I70.0). Electronically Signed   By: Garald Balding M.D.   On: 10/09/2017 00:07   Ct Abdomen Pelvis W Contrast  Result Date: 10/09/2017 CLINICAL DATA:  Acute onset of altered mental status. Generalized abdominal pain. Patient on radiation therapy for glial neoplasm. EXAM: CT ANGIOGRAPHY CHEST CT ABDOMEN AND PELVIS WITH CONTRAST TECHNIQUE: Multidetector CT imaging of the chest was performed using the standard protocol during bolus administration of intravenous contrast. Multiplanar CT image reconstructions and MIPs were obtained to evaluate the vascular anatomy. Multidetector CT imaging of the abdomen and pelvis was performed using the standard protocol during bolus administration of intravenous contrast. CONTRAST:  158mL ISOVUE-370 IOPAMIDOL (ISOVUE-370) INJECTION 76% COMPARISON:  CT of the abdomen and pelvis performed 09/02/2015, and CT of the chest performed 12/14/2011 FINDINGS: CTA CHEST FINDINGS Cardiovascular:  There is no evidence of pulmonary embolus. The heart is normal in size. The thoracic aorta is grossly unremarkable. The great vessels are within normal limits. Mediastinum/Nodes: The mediastinum is unremarkable in appearance. No mediastinal lymphadenopathy is seen. No pericardial effusion is identified. The left thyroid lobe is mildly heterogeneous but otherwise unremarkable. The right thyroid lobe is diminutive. No axillary lymphadenopathy is seen. A moderate hiatal hernia is noted. Lungs/Pleura: Bilateral atelectasis or scarring is noted. No pleural effusion or pneumothorax is seen. No masses are identified. Musculoskeletal: No  acute osseous abnormalities are identified. The visualized musculature is unremarkable in appearance. Review of the MIP images confirms the above findings. CT ABDOMEN and PELVIS FINDINGS Hepatobiliary: The liver is unremarkable in appearance. The gallbladder is unremarkable in appearance. The common bile duct remains normal in caliber. Pancreas: The pancreas is within normal limits. Spleen: A few small hypodensities within the spleen may reflect small cysts. Adrenals/Urinary Tract: The adrenal glands are unremarkable in appearance. A small right renal cyst is noted. There is no evidence of hydronephrosis. No renal or ureteral stones are identified. No perinephric stranding is appreciated. Stomach/Bowel: The stomach is unremarkable in appearance. The small bowel is within normal limits. The appendix is normal in caliber, without evidence of appendicitis. The colon is unremarkable in appearance. Mild wall thickening at the rectum could reflect mild chronic inflammation or proctitis. Vascular/Lymphatic: Scattered calcification is seen along the abdominal aorta and its branches. The abdominal aorta is otherwise grossly unremarkable. The inferior vena cava is grossly unremarkable. No retroperitoneal lymphadenopathy is seen. No pelvic sidewall lymphadenopathy is identified. Reproductive: The bladder is mildly distended and grossly unremarkable. The uterus is unremarkable in appearance. The ovaries are relatively symmetric. No suspicious adnexal masses are seen. Other: No additional soft tissue abnormalities are seen. Musculoskeletal: No acute osseous abnormalities are identified. Multilevel vacuum phenomenon is noted along the lower thoracic and lumbar spine.  The visualized musculature is unremarkable in appearance. Review of the MIP images confirms the above findings. IMPRESSION: 1. No evidence of pulmonary embolus. 2. No acute abnormality seen to explain the patient's symptoms. 3. Mild wall thickening at the rectum  could reflect mild chronic inflammation or possibly proctitis. 4. Moderate hiatal hernia noted. 5. Bilateral atelectasis or scarring noted. Lungs otherwise clear. 6. Small right renal cyst noted. Aortic Atherosclerosis (ICD10-I70.0). Electronically Signed   By: Garald Balding M.D.   On: 10/09/2017 00:07     Scheduled Meds: . amLODipine  5 mg Oral Daily  . amphetamine-dextroamphetamine  30 mg Oral Daily  . atorvastatin  40 mg Oral BID WC  . cholecalciferol  1,000 Units Oral Daily  . dexamethasone  2 mg Oral Q breakfast  . Difluprednate  1 drop Left Eye QID  . enoxaparin (LOVENOX) injection  40 mg Subcutaneous Daily  . insulin aspart  0-9 Units Subcutaneous TID WC  . ketorolac  1 drop Right Eye QID  . lacosamide  50 mg Oral BID  . mebendazole  100 mg Oral Daily  . pantoprazole  40 mg Oral Daily   Continuous Infusions: . sodium chloride 100 mL/hr at 10/09/17 0805     LOS: 0 days   Time Spent in minutes   30 minutes  Anastaisa Wooding D.O. on 10/09/2017 at 12:14 PM  Between 7am to 7pm - Pager - [phone]  After 7pm go to [email or web address] - password [email or web address]  And look for the night coverage person covering for me after hours  [facility] Office  [phone]

## 2017-10-10 DIAGNOSIS — K436 Other and unspecified ventral hernia with obstruction, without gangrene: Secondary | ICD-10-CM | POA: Diagnosis not present

## 2017-10-10 DIAGNOSIS — Z9181 History of falling: Secondary | ICD-10-CM | POA: Diagnosis not present

## 2017-10-10 DIAGNOSIS — I10 Essential (primary) hypertension: Secondary | ICD-10-CM | POA: Diagnosis not present

## 2017-10-10 DIAGNOSIS — M6281 Muscle weakness (generalized): Secondary | ICD-10-CM | POA: Diagnosis not present

## 2017-10-10 DIAGNOSIS — L989 Disorder of the skin and subcutaneous tissue, unspecified: Secondary | ICD-10-CM | POA: Diagnosis not present

## 2017-10-10 DIAGNOSIS — G9341 Metabolic encephalopathy: Secondary | ICD-10-CM | POA: Diagnosis not present

## 2017-10-10 DIAGNOSIS — R269 Unspecified abnormalities of gait and mobility: Secondary | ICD-10-CM | POA: Diagnosis not present

## 2017-10-10 DIAGNOSIS — C719 Malignant neoplasm of brain, unspecified: Secondary | ICD-10-CM | POA: Diagnosis not present

## 2017-10-10 DIAGNOSIS — C712 Malignant neoplasm of temporal lobe: Secondary | ICD-10-CM | POA: Diagnosis not present

## 2017-10-10 DIAGNOSIS — E118 Type 2 diabetes mellitus with unspecified complications: Secondary | ICD-10-CM

## 2017-10-10 DIAGNOSIS — L89151 Pressure ulcer of sacral region, stage 1: Secondary | ICD-10-CM | POA: Diagnosis not present

## 2017-10-10 LAB — GLUCOSE, CAPILLARY
GLUCOSE-CAPILLARY: 75 mg/dL (ref 70–99)
GLUCOSE-CAPILLARY: 127 mg/dL — AB (ref 70–99)
Glucose-Capillary: 75 mg/dL (ref 70–99)
Glucose-Capillary: 67 mg/dL — ABNORMAL LOW (ref 70–99)
Glucose-Capillary: 78 mg/dL (ref 70–99)
Glucose-Capillary: 93 mg/dL (ref 70–99)
Glucose-Capillary: 159 mg/dL — ABNORMAL HIGH (ref 70–99)

## 2017-10-10 MED ORDER — KETOROLAC TROMETHAMINE 0.5 % OP SOLN
1.0000 [drp] | Freq: Four times a day (QID) | OPHTHALMIC | Status: DC
  Administered 2017-10-10: 1 [drp] via OPHTHALMIC
  Filled 2017-10-10: qty 5

## 2017-10-10 MED ORDER — BROMFENAC SODIUM 0.07 % OP SOLN
1.0000 [drp] | Freq: Three times a day (TID) | OPHTHALMIC | Status: DC
  Administered 2017-10-11: 1 [drp] via OPHTHALMIC

## 2017-10-10 MED ORDER — NON FORMULARY: 1.0000 [drp] | Freq: Every day | Status: DC

## 2017-10-10 NOTE — Care Management Note (Signed)
Case Management Note  Patient Details  Name: Kelly Maxwell MRN: [id number] Date of Birth: 07-20-1945  Subjective/Objective:Spoke to dtr Erinn about d/c plans-patient lives w/spouse, & dtr Erinn-states they have taken are of her Mom in the home, & can continue to take care of her if she is still @ the same functioning level as before-she will talk to her Dad & call me back with the answer since she has not been @ the hospital but her Dad is. Dtr is in agreement to [facility] for Jay aware & following.Recc-HHRN/PT/aide/CSW.   Action/Plan:d/c plan SNF vs HHC.   Expected Discharge Date:                  Expected Discharge Plan:  [place]  In-House Referral:  Clinical Social Work  Discharge planning Services  CM Consult  Post Acute Care Choice:    Choice offered to:  Adult Children  DME Arranged:    DME Agency:     HH Arranged:    HH Agency:     Status of Service:  In process, will continue to follow  If discussed at Long Length of Stay Meetings, dates discussed:    Additional Comments:  Dessa Phi, RN 10/10/2017, 2:53 PM

## 2017-10-10 NOTE — Progress Notes (Signed)
PROGRESS NOTE    NAHOMY LIMBURG  IFO:[id number] DOB: 1945-10-04 DOA: 10/08/2017 PCP: Oncology, Physician, MD   Brief Narrative:  HPI on 10/09/2017 by Dr. Roland Rack is a 72 y.o. female with medical history significant of gliosarcoma, DM2.  Patient brought in to ED by family for AMS. Patient recently had admission in May for AMS which was determined to be due to a combination of dehydration and treatment of UTI with cipro. At baseline, patient is described as: alert but not attentative oriented to self and environment language is impaired to fluency and comprehension. Struggles with multiple step commands. Very poor recall poor short-term memory. 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 Patient is also been visited by palliative care. Patient is still a full code. Today patient was noted to be more sleepy and less active.  Not taking POs very well.  Family concerned for dehydration. Of note patient just finished Keflex today for treatment of UTI.  Interim history Admitted for acute metabolic encephalopathy and placed on IV hydration.  Encephalopathy does appear to be improving.  Assessment & Plan   Acute metabolic encephalopathy -Possibly secondary to dehydration -CXR and UA negative for infection -CT head showed white matter changes (noted below) -Currently afebrile with no leukocytosis -Patient currently alert and oriented to self however unknown baseline.  Upon review of patient's chart, during her last office visit with Dr. Mickeal Skinner, patient was alert to self and environment however was not attentive.  She had poor recall and short-term memory.  Language is impaired to do fluency and comprehension.  Gliosarcoma -Currently receiving chemotherapy -CT head showed extensive supratentorial white matter change, more on left, involving left frontal and temporal lobes as well as left caudate and basal ganglia consistent with known glial tumor.   No significant change identified. -Patient follows with Dr. Mickeal Skinner.  Placed on Adderall for cancer related fatigue.  Last progress note 09/19/2017 did discuss patient's poor functional status -Continue Vimpat, Decadron -Of note, patient is followed by palliative care/[facility]  Essential Hypertension -Stable, continue amlodipine  Diabetes mellitus, type II -Metformin held -Continue insulin sliding scale  and CBG monitoring  Right lower extremity edema -CTA unremarkable for PE -Lower extremity doppler negative for DVT  Physical deconditioning -PT evaluated patient, recommending SNF -Requiring 2 person assist -Consult OT -Social work consulted  DVT Prophylaxis  Lovenox  Code Status: Full  Family Communication: None at bedside  Disposition Plan: Currently in observation. SNF when available.  Patient does not appear to be a safe discharge to home given her dementia and immobility.  Patient needing 2 person assist for minimal movement.  Consultants None  Procedures  Right lower extremity doppler   Antibiotics   Anti-infectives (From admission, onward)   Start     Dose/Rate Route Frequency Ordered Stop   10/09/17 1000  mebendazole (VERMOX) chewable tablet 100 mg  Status:  Discontinued     100 mg Oral Daily 10/09/17 0045 10/09/17 1301      Subjective:   [name] seen and examined today.  Patient only alert to self at this time.  Objective:   Vitals:   10/09/17 0549 10/09/17 1310 10/09/17 2056 10/10/17 0544  BP: 134/73 121/70 126/82 119/72  Pulse: 90 76 74 78  Resp: 20 16 14 18   Temp: 97.6 F (36.4 C) 98.2 F (36.8 C) 98.9 F (37.2 C) 98.3 F (36.8 C)  TempSrc: Oral Oral Oral Oral  SpO2: 97%  100% 96% 98%  Weight:      Height:        Intake/Output Summary (Last 24 hours) at 10/10/2017 1235 Last data filed at 10/10/2017 1000 Gross per 24 hour  Intake 2850.69 ml  Output 1300 ml  Net 1550.69 ml   Filed Weights   10/08/17 1837 10/09/17 0242    Weight: 65.8 kg 65 kg   Exam  General: Well developed, well nourished, NAD, appears stated age  34: NCAT, mucous membranes moist.   Neck: Supple  Cardiovascular: S1 S2 auscultated, RRR, no murmur  Respiratory: Clear to auscultation bilaterally with equal chest rise  Abdomen: Soft, nontender, nondistended, + bowel sounds  Extremities: warm dry without cyanosis clubbing or edema  Neuro: AAOx1 (self only)  Data Reviewed: I have personally reviewed following labs and imaging studies  CBC: Recent Labs  Lab 10/08/17 2130 10/08/17 2147  WBC  --  8.1  NEUTROABS  --  7.4  HGB 11.6* 11.5*  HCT 34.0* 36.3  MCV  --  95.5  PLT  --  081   Basic Metabolic Panel: Recent Labs  Lab 10/08/17 2119 10/08/17 2130 10/09/17 0417  NA 139 137 138  K 3.5 3.6 3.6  CL 107 105 109  CO2 22  --  21*  GLUCOSE 93 91 79  BUN 12 11 9   CREATININE 0.31* 0.30* <0.30*  CALCIUM 8.9  --  8.5*   GFR: CrCl cannot be calculated (This lab value cannot be used to calculate CrCl because it is not a number: <0.30). Liver Function Tests: Recent Labs  Lab 10/08/17 2119  AST 30  ALT 31  ALKPHOS 62  BILITOT 0.5  PROT 5.9*  ALBUMIN 3.2*   Recent Labs  Lab 10/08/17 2119  LIPASE 44   No results for input(s): AMMONIA in the last 168 hours. Coagulation Profile: No results for input(s): INR, PROTIME in the last 168 hours. Cardiac Enzymes: Recent Labs  Lab 10/08/17 2119  TROPONINI <0.03   BNP (last 3 results) No results for input(s): PROBNP in the last 8760 hours. HbA1C: No results for input(s): HGBA1C in the last 72 hours. CBG: Recent Labs  Lab 10/10/17 0214 10/10/17 0608 10/10/17 0741 10/10/17 1019 10/10/17 1203  GLUCAP 75 75 67* 78 93   Lipid Profile: No results for input(s): CHOL, HDL, LDLCALC, TRIG, CHOLHDL, LDLDIRECT in the last 72 hours. Thyroid Function Tests: No results for input(s): TSH, T4TOTAL, FREET4, T3FREE, THYROIDAB in the last 72 hours. Anemia Panel: No  results for input(s): VITAMINB12, FOLATE, FERRITIN, TIBC, IRON, RETICCTPCT in the last 72 hours. Urine analysis:    Component Value Date/Time   COLORURINE YELLOW 10/08/2017 1931   APPEARANCEUR CLEAR 10/08/2017 1931   LABSPEC 1.016 10/08/2017 1931   PHURINE 6.0 10/08/2017 1931   GLUCOSEU NEGATIVE 10/08/2017 1931   HGBUR NEGATIVE 10/08/2017 1931   BILIRUBINUR NEGATIVE 10/08/2017 1931   KETONESUR 5 (A) 10/08/2017 1931   PROTEINUR NEGATIVE 10/08/2017 1931   NITRITE NEGATIVE 10/08/2017 1931   LEUKOCYTESUR NEGATIVE 10/08/2017 1931   Sepsis Labs: @LABRCNTIP (procalcitonin:4,lacticidven:4)  )No results found for this or any previous visit (from the past 240 hour(s)).    Radiology Studies: Dg Chest 2 View  Result Date: 10/08/2017 CLINICAL DATA:  Acute onset of altered mental status. Dehydration. EXAM: CHEST - 2 VIEW COMPARISON:  Chest radiograph performed 07/16/2017 FINDINGS: The lungs are hypoexpanded. Mild bibasilar airspace opacities likely reflect atelectasis. No pleural effusion or pneumothorax is seen. The heart is normal in size. No acute osseous abnormalities  are identified. IMPRESSION: Lungs hypoexpanded. Mild bibasilar airspace opacities likely reflect atelectasis. Electronically Signed   By: Garald Balding M.D.   On: 10/08/2017 22:20   Ct Head Wo Contrast  Result Date: 10/09/2017 CLINICAL DATA:  Altered mental status. Patient is currently being treated for a glial neoplasm with radiation therapy. EXAM: CT HEAD WITHOUT CONTRAST TECHNIQUE: Contiguous axial images were obtained from the base of the skull through the vertex without intravenous contrast. COMPARISON:  CT 07/16/2017 and MRI 08/27/2017 FINDINGS: Brain: Acute intracranial hemorrhage or large vascular territory infarct. Extensive white matter changes from known glial tumor involving the left frontal and temporal lobes as well as left caudate and basal ganglia appear stable. No hydrocephalus. No extra-axial fluid. Midline fourth  ventricle and basal cisterns with patency and without effacement. No cerebellar lesion is identified. Brainstem unremarkable. Vascular: No hyperdense vessel sign. Carotid calcifications are noted bilaterally. Skull: Left temporoparietal craniotomy change. No acute fracture or suspicious osseous lesions. Sinuses/Orbits: Left lens replacement. No acute paranasal sinus disease. Other: None IMPRESSION: Extensive supratentorial white matter change, more so on the left involving the left frontal and temporal lobes as well as the left caudate and basal ganglia consistent with known glial tumor. No significant change identified. Left-sided temporoparietal craniotomy. Electronically Signed   By: Ashley Royalty M.D.   On: 10/09/2017 00:06   Ct Angio Chest Pe W/cm &/or Wo Cm  Result Date: 10/09/2017 CLINICAL DATA:  Acute onset of altered mental status. Generalized abdominal pain. Patient on radiation therapy for glial neoplasm. EXAM: CT ANGIOGRAPHY CHEST CT ABDOMEN AND PELVIS WITH CONTRAST TECHNIQUE: Multidetector CT imaging of the chest was performed using the standard protocol during bolus administration of intravenous contrast. Multiplanar CT image reconstructions and MIPs were obtained to evaluate the vascular anatomy. Multidetector CT imaging of the abdomen and pelvis was performed using the standard protocol during bolus administration of intravenous contrast. CONTRAST:  127mL ISOVUE-370 IOPAMIDOL (ISOVUE-370) INJECTION 76% COMPARISON:  CT of the abdomen and pelvis performed 09/02/2015, and CT of the chest performed 12/14/2011 FINDINGS: CTA CHEST FINDINGS Cardiovascular:  There is no evidence of pulmonary embolus. The heart is normal in size. The thoracic aorta is grossly unremarkable. The great vessels are within normal limits. Mediastinum/Nodes: The mediastinum is unremarkable in appearance. No mediastinal lymphadenopathy is seen. No pericardial effusion is identified. The left thyroid lobe is mildly heterogeneous but  otherwise unremarkable. The right thyroid lobe is diminutive. No axillary lymphadenopathy is seen. A moderate hiatal hernia is noted. Lungs/Pleura: Bilateral atelectasis or scarring is noted. No pleural effusion or pneumothorax is seen. No masses are identified. Musculoskeletal: No acute osseous abnormalities are identified. The visualized musculature is unremarkable in appearance. Review of the MIP images confirms the above findings. CT ABDOMEN and PELVIS FINDINGS Hepatobiliary: The liver is unremarkable in appearance. The gallbladder is unremarkable in appearance. The common bile duct remains normal in caliber. Pancreas: The pancreas is within normal limits. Spleen: A few small hypodensities within the spleen may reflect small cysts. Adrenals/Urinary Tract: The adrenal glands are unremarkable in appearance. A small right renal cyst is noted. There is no evidence of hydronephrosis. No renal or ureteral stones are identified. No perinephric stranding is appreciated. Stomach/Bowel: The stomach is unremarkable in appearance. The small bowel is within normal limits. The appendix is normal in caliber, without evidence of appendicitis. The colon is unremarkable in appearance. Mild wall thickening at the rectum could reflect mild chronic inflammation or proctitis. Vascular/Lymphatic: Scattered calcification is seen along the abdominal aorta and its  branches. The abdominal aorta is otherwise grossly unremarkable. The inferior vena cava is grossly unremarkable. No retroperitoneal lymphadenopathy is seen. No pelvic sidewall lymphadenopathy is identified. Reproductive: The bladder is mildly distended and grossly unremarkable. The uterus is unremarkable in appearance. The ovaries are relatively symmetric. No suspicious adnexal masses are seen. Other: No additional soft tissue abnormalities are seen. Musculoskeletal: No acute osseous abnormalities are identified. Multilevel vacuum phenomenon is noted along the lower thoracic  and lumbar spine. The visualized musculature is unremarkable in appearance. Review of the MIP images confirms the above findings. IMPRESSION: 1. No evidence of pulmonary embolus. 2. No acute abnormality seen to explain the patient's symptoms. 3. Mild wall thickening at the rectum could reflect mild chronic inflammation or possibly proctitis. 4. Moderate hiatal hernia noted. 5. Bilateral atelectasis or scarring noted. Lungs otherwise clear. 6. Small right renal cyst noted. Aortic Atherosclerosis (ICD10-I70.0). Electronically Signed   By: Garald Balding M.D.   On: 10/09/2017 00:07   Ct Abdomen Pelvis W Contrast  Result Date: 10/09/2017 CLINICAL DATA:  Acute onset of altered mental status. Generalized abdominal pain. Patient on radiation therapy for glial neoplasm. EXAM: CT ANGIOGRAPHY CHEST CT ABDOMEN AND PELVIS WITH CONTRAST TECHNIQUE: Multidetector CT imaging of the chest was performed using the standard protocol during bolus administration of intravenous contrast. Multiplanar CT image reconstructions and MIPs were obtained to evaluate the vascular anatomy. Multidetector CT imaging of the abdomen and pelvis was performed using the standard protocol during bolus administration of intravenous contrast. CONTRAST:  155mL ISOVUE-370 IOPAMIDOL (ISOVUE-370) INJECTION 76% COMPARISON:  CT of the abdomen and pelvis performed 09/02/2015, and CT of the chest performed 12/14/2011 FINDINGS: CTA CHEST FINDINGS Cardiovascular:  There is no evidence of pulmonary embolus. The heart is normal in size. The thoracic aorta is grossly unremarkable. The great vessels are within normal limits. Mediastinum/Nodes: The mediastinum is unremarkable in appearance. No mediastinal lymphadenopathy is seen. No pericardial effusion is identified. The left thyroid lobe is mildly heterogeneous but otherwise unremarkable. The right thyroid lobe is diminutive. No axillary lymphadenopathy is seen. A moderate hiatal hernia is noted. Lungs/Pleura:  Bilateral atelectasis or scarring is noted. No pleural effusion or pneumothorax is seen. No masses are identified. Musculoskeletal: No acute osseous abnormalities are identified. The visualized musculature is unremarkable in appearance. Review of the MIP images confirms the above findings. CT ABDOMEN and PELVIS FINDINGS Hepatobiliary: The liver is unremarkable in appearance. The gallbladder is unremarkable in appearance. The common bile duct remains normal in caliber. Pancreas: The pancreas is within normal limits. Spleen: A few small hypodensities within the spleen may reflect small cysts. Adrenals/Urinary Tract: The adrenal glands are unremarkable in appearance. A small right renal cyst is noted. There is no evidence of hydronephrosis. No renal or ureteral stones are identified. No perinephric stranding is appreciated. Stomach/Bowel: The stomach is unremarkable in appearance. The small bowel is within normal limits. The appendix is normal in caliber, without evidence of appendicitis. The colon is unremarkable in appearance. Mild wall thickening at the rectum could reflect mild chronic inflammation or proctitis. Vascular/Lymphatic: Scattered calcification is seen along the abdominal aorta and its branches. The abdominal aorta is otherwise grossly unremarkable. The inferior vena cava is grossly unremarkable. No retroperitoneal lymphadenopathy is seen. No pelvic sidewall lymphadenopathy is identified. Reproductive: The bladder is mildly distended and grossly unremarkable. The uterus is unremarkable in appearance. The ovaries are relatively symmetric. No suspicious adnexal masses are seen. Other: No additional soft tissue abnormalities are seen. Musculoskeletal: No acute osseous abnormalities are identified.  Multilevel vacuum phenomenon is noted along the lower thoracic and lumbar spine. The visualized musculature is unremarkable in appearance. Review of the MIP images confirms the above findings. IMPRESSION: 1. No  evidence of pulmonary embolus. 2. No acute abnormality seen to explain the patient's symptoms. 3. Mild wall thickening at the rectum could reflect mild chronic inflammation or possibly proctitis. 4. Moderate hiatal hernia noted. 5. Bilateral atelectasis or scarring noted. Lungs otherwise clear. 6. Small right renal cyst noted. Aortic Atherosclerosis (ICD10-I70.0). Electronically Signed   By: Garald Balding M.D.   On: 10/09/2017 00:07     Scheduled Meds: . amLODipine  5 mg Oral Daily  . amphetamine-dextroamphetamine  30 mg Oral Daily  . atorvastatin  40 mg Oral BID WC  . cholecalciferol  1,000 Units Oral Daily  . dexamethasone  2 mg Oral Q breakfast  . Difluprednate  1 drop Left Eye QID  . enoxaparin (LOVENOX) injection  40 mg Subcutaneous Daily  . insulin aspart  0-9 Units Subcutaneous TID WC  . ketorolac  1 drop Right Eye QID  . lacosamide  50 mg Oral BID  . pantoprazole  40 mg Oral Daily   Continuous Infusions: . sodium chloride 100 mL/hr at 10/10/17 0600     LOS: 0 days   Time Spent in minutes   30 minutes  Yadhira Mckneely D.O. on 10/10/2017 at 12:35 PM  Between 7am to 7pm - Pager - [phone]  After 7pm go to [email or web address] - password [email or web address]  And look for the night coverage person covering for me after hours  [facility] Office  [phone]

## 2017-10-10 NOTE — NC FL2 (Signed)
[place] LEVEL OF CARE SCREENING TOOL     IDENTIFICATION  Patient Name: Kelly Maxwell Birthdate: 09-12-1945 Sex: female Admission Date (Current Location): 10/08/2017  [facility] and [place] Number:  Herbalist and Address:  [facility],  [place] [address], [place]      Provider Number: [id number]  Attending Physician Name and Address:  Cristal Ford, DO  Relative Name and Phone Number:       Current Level of Care: Hospital Recommended Level of Care: [place] Prior Approval Number:    Date Approved/Denied:   PASRR Number:    Discharge Plan: SNF    Current Diagnoses: Patient Active Problem List   Diagnosis Date Noted  . DM2 (diabetes mellitus, type 2) ([place]) 10/09/2017  . Unsteady gait 09/19/2017  . Weakness generalized 09/19/2017  . Counseling regarding advanced care planning and goals of care 09/19/2017  . HTN (hypertension) 07/19/2017  . Acute metabolic encephalopathy 70/35/0093  . Dehydration 07/16/2017  . 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 Anxiety and depression 09/03/2015  . Personal history of other infectious and parasitic diseases 09/03/2015  . 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 HLD (hyperlipidemia)   . GERD (gastroesophageal reflux disease)   . PONV (postoperative nausea and vomiting)   . 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 Hiatal hernia     Orientation RESPIRATION BLADDER Height & Weight     Self  Normal Continent Weight: 143 lb 4.8 oz (65 kg) Height:  5\' 4"  (162.6 cm)  BEHAVIORAL SYMPTOMS/MOOD NEUROLOGICAL BOWEL NUTRITION STATUS      Continent Diet(Carb Modified. )  AMBULATORY STATUS COMMUNICATION OF NEEDS Skin   Extensive Assist Verbally Normal                       Personal Care Assistance Level of Assistance  Bathing, Feeding, Dressing Bathing Assistance: Maximum assistance Feeding assistance: Limited  assistance Dressing Assistance: Maximum assistance     Functional Limitations Info  Sight, Hearing, Speech Sight Info: Adequate Hearing Info: Adequate Speech Info: Adequate    SPECIAL CARE FACTORS FREQUENCY  PT (By licensed PT), OT (By licensed OT)     PT Frequency: 5x/week  OT Frequency: 5x/week            Contractures Contractures Info: Not present    Additional Factors Info  Code Status, Allergies Code Status Info: Fullcode              Current Medications (10/10/2017):  This is the current hospital active medication list Current Facility-Administered Medications  Medication Dose Route Frequency Provider Last Rate Last Dose  . 0.9 %  sodium chloride infusion   Intravenous Continuous Jennette Kettle M, DO 100 mL/hr at 10/10/17 0600    . acetaminophen (TYLENOL) tablet 650 mg  650 mg Oral Q6H PRN [name], DO       Or  . acetaminophen (TYLENOL) suppository 650 mg  650 mg Rectal Q6H PRN [name], DO      . amLODipine (NORVASC) tablet 5 mg  5 mg Oral Daily Jennette Kettle M, DO   5 mg at 10/10/17 1025  . amphetamine-dextroamphetamine (ADDERALL) tablet 30 mg  30 mg Oral Daily Jennette Kettle M, DO   30 mg at 10/10/17 1024  . atorvastatin (LIPITOR) tablet 40 mg  40 mg Oral BID WC Jennette Kettle M, DO   40 mg at 10/10/17 1025  .  cholecalciferol (VITAMIN D) tablet 1,000 Units  1,000 Units Oral Daily [name], DO   1,000 Units at 10/10/17 1025  . clonazePAM (KLONOPIN) tablet 0.5 mg  0.5 mg Oral QHS PRN [name], DO   0.5 mg at 10/09/17 2129  . dexamethasone (DECADRON) tablet 2 mg  2 mg Oral Q breakfast Jennette Kettle M, DO   2 mg at 10/10/17 1101  . Difluprednate 0.05 % EMUL 1 drop  1 drop Left Eye QID [name], DO   Stopped at 10/10/17 1257  . enoxaparin (LOVENOX) injection 40 mg  40 mg Subcutaneous Daily Jennette Kettle M, DO   40 mg at 10/10/17 1026  . insulin aspart (novoLOG) injection 0-9 Units  0-9 Units Subcutaneous TID WC [name], DO      . ketorolac (ACULAR) 0.5 % ophthalmic solution 1 drop  1 drop Right Eye QID Jennette Kettle M, DO   1 drop at 10/10/17 1430  . lacosamide (VIMPAT) tablet 50 mg  50 mg Oral BID Fredia Sorrow, MD   50 mg at 10/10/17 1025  . ondansetron (ZOFRAN) tablet 4 mg  4 mg Oral Q6H PRN [name], DO       Or  . ondansetron [facility]) injection 4 mg  4 mg Intravenous Q6H PRN [name], DO      . pantoprazole (PROTONIX) EC tablet 40 mg  40 mg Oral Daily Jennette Kettle M, DO   40 mg at 10/10/17 1025  . senna-docusate (Senokot-S) tablet 1 tablet  1 tablet Oral QHS PRN [name], DO         Discharge Medications: Please see discharge summary for a list of discharge medications.  Relevant Imaging Results:  Relevant Lab Results:   Additional Information [id number]  Lia Hopping, LCSW

## 2017-10-10 NOTE — Discharge Summary (Addendum)
Physician Discharge Summary  Kelly Maxwell HYW:[id number] DOB: September 28, 1945 DOA: 10/08/2017  PCP: Oncology, Physician, MD  Admit date: 10/08/2017 Discharge date: 10/11/2017  Time spent: 45 minutes  Recommendations for Outpatient Follow-up:  Kelly Maxwell will be discharged to home with home health services, physical and occupational therapy, nursing, aide, social work.  Kelly Maxwell will need to follow up with primary care provider within one week of discharge.  Kelly Maxwell should continue medications as prescribed.  Kelly Maxwell should follow a carb modified diet.   Discharge Diagnoses:  Acute metabolic encephalopathy Gliosarcoma Essential Hypertension Diabetes mellitus, type II Right lower extremity edema  Discharge Condition: Stable  Diet recommendation: carb modified  Filed Weights   10/08/17 1837 10/09/17 0242  Weight: 65.8 kg 65 kg    History of present illness:  on 10/09/2017 by Dr. Launa Grill a 72 y.o.femalewith medical history significant ofgliosarcoma, DM2. Kelly Maxwell brought in to ED by family for AMS. Kelly Maxwell recently had admission in May for AMS which was determined to be due to a combination of dehydration and treatment of UTI with cipro. At baseline, Kelly Maxwell is described SW:[id number] attentative oriented to self and environment language is impaired to fluency and comprehension. Struggles with multiple step commands. Very poor recall poor short-term memory. 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 Kelly Maxwell is also been visited by palliative care. Kelly Maxwell is still a full code. Today Kelly Maxwell was noted to be more sleepy and less active. Not taking POs very well. Family concerned for dehydration. Of note Kelly Maxwell just finished Keflex today for treatment of UTI.  Hospital Course:  Acute metabolic encephalopathy -Possibly secondary to dehydration -CXR and UA negative for infection -CT head showed white matter changes (noted  below) -Currently afebrile with no leukocytosis -Kelly Maxwell currently alert and oriented to self however unknown baseline.  Upon review of Kelly Maxwell's chart, during Kelly Maxwell last office visit with Dr. Mickeal Skinner, Kelly Maxwell was alert to self and environment however was not attentive.  She had poor recall and short-term memory.  Language is impaired to do fluency and comprehension.  Gliosarcoma -Currently receiving chemotherapy -CT head showed extensive supratentorial white matter change, more on left, involving left frontal and temporal lobes as well as left caudate and basal ganglia consistent with known glial tumor.  No significant change identified. -Kelly Maxwell follows with Dr. Mickeal Skinner.  Placed on Adderall for cancer related fatigue.  Last progress note 09/19/2017 did discuss Kelly Maxwell's poor functional status -Continue Vimpat, Decadron -Of note, Kelly Maxwell is followed by palliative care/[facility]  Essential Hypertension -Stable, continue amlodipine  Diabetes mellitus, type II -Metformin held- may restart on discharge -was placed on ISS during hospitalization   Right lower extremity edema -CTA unremarkable for PE -Lower extremity doppler negative for DVT  Physical deconditioning  -PT recommended SNF -Kelly Maxwell requiring 2person assist -social work consulted  -family decided to take the Kelly Maxwell home- will discharge Kelly Maxwell with [facility]  Procedures: RLE doppler  Consultations: None  Discharge Exam: Vitals:   10/10/17 2249 10/11/17 0632  BP: 117/77 (!) 144/77  Pulse: 72 65  Resp: 20 20  Temp: 98.6 F (37 C) 97.6 F (36.4 C)  SpO2: 97% 95%     General: Well developed, well nourished, NAD, appears stated age  HEENT: NCAT, mucous membranes moist.  Neck: Supple  Cardiovascular: S1 S2 auscultated, RRR, no murmur  Respiratory: Clear to auscultation bilaterally with equal chest rise  Abdomen: Soft, nontender, nondistended, + bowel sounds  Extremities: warm dry without cyanosis  clubbing or edema  Neuro: AAOx1 (self only)  Discharge Instructions Discharge Instructions    Discharge instructions   Complete by:  As directed    Kelly Maxwell will be discharged to home with home health services, physical and occupational therapy, nursing, aide, social work.  Kelly Maxwell will need to follow up with primary care provider within one week of discharge.  Kelly Maxwell should continue medications as prescribed.  Kelly Maxwell should follow a carb modified diet.     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     Compazine [prochlorperazine Edisylate] Other (See Comments), Rash   Became hyper Became hyper      Medication List    STOP taking these medications   cephALEXin 500 MG capsule Commonly known as:  KEFLEX   mebendazole 100 MG chewable tablet Commonly known as:  VERMOX     TAKE these medications   amLODipine 5 MG tablet Commonly known as:  NORVASC Take 1 tablet (5 mg total) by mouth daily.   amphetamine-dextroamphetamine 30 MG tablet Commonly known as:  ADDERALL Take 1 tablet by mouth daily.   atorvastatin 40 MG tablet Commonly known as:  LIPITOR Take 40 mg by mouth 2 (two) times daily.   Boswellia Serrata Extract Powd 1 packet by Does not apply route.   cholecalciferol 1000 units tablet Commonly known as:  VITAMIN D Take 1 tablet (1,000 Units total) by mouth daily.   clonazePAM 0.5 MG tablet Commonly known as:  KLONOPIN Take 1 tablet (0.5 mg total) by mouth at bedtime as needed for anxiety.   [employer] DIAPER 1-10 % Crea APPLY EXTERNALLY AS DIRECTED AS NEEDED WITH EACH DIAPHER CHANGE   dexamethasone 2 MG tablet Commonly known as:  DECADRON Take 1 tablet (2 mg total) by mouth daily.   DUREZOL 0.05 % Emul Generic drug:  Difluprednate Place 1 drop into the left eye 4 (four) times daily.    FIRST-DUKES MOUTHWASH MT Take 5 mLs by mouth every 5 (five) hours as needed for mouth pain.   GLEOSTINE 10 MG capsule Generic drug:  lomustine Take 10 mg by mouth daily.   GLEOSTINE 100 MG capsule Generic drug:  lomustine Take 100 mg by mouth daily.   GLEOSTINE 40 MG capsule Generic drug:  lomustine Take 40 mg by mouth daily.   lacosamide 50 MG Tabs tablet Commonly known as:  VIMPAT Take 1 tablet (50 mg total) by mouth 2 (two) times daily.   Melatonin 5 MG Caps Take 10-20 mg by mouth at bedtime as needed (sleep).   metFORMIN 500 MG tablet Commonly known as:  GLUCOPHAGE Take by mouth 2 (two) times daily with a meal.   NON FORMULARY Take 1 capsule by mouth 2 (two) times daily. Lazarus Natural CBD Oil   nystatin cream Commonly known as:  MYCOSTATIN Apply 1 application topically as needed.   ondansetron 8 MG tablet Commonly known as:  ZOFRAN Take 1 tablets (8mg ) by mouth once 30-60 prior to lomustine and Q 8h prn N/V   pantoprazole 40 MG tablet Commonly known as:  PROTONIX Take 1 tablet (40 mg total) by mouth daily.   PROLENSA 0.07 % Soln Generic drug:  Bromfenac Sodium Place 1 drop into the right eye 3 (three) times daily. Reported on 09/02/2015   senna-docusate 8.6-50 MG tablet Commonly known as:  Senokot-S  Take 1 tablet by mouth at bedtime as needed for mild constipation.      Allergies  Allergen Reactions  . Ciprofloxacin     AMS  . Codeine Nausea And Vomiting and Nausea Only  . 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    . Compazine [Prochlorperazine Edisylate] Other (See Comments) and Rash    Became hyper Became hyper   Follow-up Information    Oncology, Physician, MD. Schedule an appointment as soon as possible for a visit in 1 week(s).   Specialty:  Oncology Why:  Hospital follow up Contact information: [place] [address] [phone]             The results of significant diagnostics from this hospitalization (including imaging, microbiology, ancillary and laboratory) are listed below for reference.    Significant Diagnostic Studies: Dg Chest 2 View  Result Date: 10/08/2017 CLINICAL DATA:  Acute onset of altered mental status. Dehydration. EXAM: CHEST - 2 VIEW COMPARISON:  Chest radiograph performed 07/16/2017 FINDINGS: The lungs are hypoexpanded. Mild bibasilar airspace opacities likely reflect atelectasis. No pleural effusion or pneumothorax is seen. The heart is normal in size. No acute osseous abnormalities are identified. IMPRESSION: Lungs hypoexpanded. Mild bibasilar airspace opacities likely reflect atelectasis. Electronically Signed   By: Garald Balding M.D.   On: 10/08/2017 22:20   Ct Head Wo Contrast  Result Date: 10/09/2017 CLINICAL DATA:  Altered mental status. Kelly Maxwell is currently being treated for a glial neoplasm with radiation therapy. EXAM: CT HEAD WITHOUT CONTRAST TECHNIQUE: Contiguous axial images were obtained from the base of the skull through the vertex without intravenous contrast. COMPARISON:  CT 07/16/2017 and MRI 08/27/2017 FINDINGS: Brain: Acute intracranial hemorrhage or large vascular territory infarct. Extensive white matter changes from known glial tumor involving the left frontal and temporal lobes as well as left caudate and basal ganglia appear stable. No hydrocephalus. No extra-axial fluid. Midline fourth ventricle and basal cisterns with patency and without effacement. No cerebellar lesion is identified. Brainstem unremarkable. Vascular: No hyperdense vessel sign. Carotid calcifications are noted bilaterally. Skull: Left temporoparietal craniotomy change. No acute fracture or suspicious osseous lesions. Sinuses/Orbits: Left lens replacement. No acute paranasal sinus disease. Other: None IMPRESSION: Extensive supratentorial white matter change, more so on the left involving the left frontal and temporal  lobes as well as the left caudate and basal ganglia consistent with known glial tumor. No significant change identified. Left-sided temporoparietal craniotomy. Electronically Signed   By: Ashley Royalty M.D.   On: 10/09/2017 00:06   Ct Angio Chest Pe W/cm &/or Wo Cm  Result Date: 10/09/2017 CLINICAL DATA:  Acute onset of altered mental status. Generalized abdominal pain. Kelly Maxwell on radiation therapy for glial neoplasm. EXAM: CT ANGIOGRAPHY CHEST CT ABDOMEN AND PELVIS WITH CONTRAST TECHNIQUE: Multidetector CT imaging of the chest was performed using the standard protocol during bolus administration of intravenous contrast. Multiplanar CT image reconstructions and MIPs were obtained to evaluate the vascular anatomy. Multidetector CT imaging of the abdomen and pelvis was performed using the standard protocol during bolus administration of intravenous contrast. CONTRAST:  173mL ISOVUE-370 IOPAMIDOL (ISOVUE-370) INJECTION 76% COMPARISON:  CT of the abdomen and pelvis performed 09/02/2015, and CT of the chest performed 12/14/2011 FINDINGS: CTA CHEST FINDINGS Cardiovascular:  There is no evidence of pulmonary embolus. The heart is normal in size. The thoracic aorta is grossly unremarkable. The great vessels are within normal limits. Mediastinum/Nodes: The mediastinum is unremarkable in appearance. No mediastinal  lymphadenopathy is seen. No pericardial effusion is identified. The left thyroid lobe is mildly heterogeneous but otherwise unremarkable. The right thyroid lobe is diminutive. No axillary lymphadenopathy is seen. A moderate hiatal hernia is noted. Lungs/Pleura: Bilateral atelectasis or scarring is noted. No pleural effusion or pneumothorax is seen. No masses are identified. Musculoskeletal: No acute osseous abnormalities are identified. The visualized musculature is unremarkable in appearance. Review of the MIP images confirms the above findings. CT ABDOMEN and PELVIS FINDINGS Hepatobiliary: The liver is  unremarkable in appearance. The gallbladder is unremarkable in appearance. The common bile duct remains normal in caliber. Pancreas: The pancreas is within normal limits. Spleen: A few small hypodensities within the spleen may reflect small cysts. Adrenals/Urinary Tract: The adrenal glands are unremarkable in appearance. A small right renal cyst is noted. There is no evidence of hydronephrosis. No renal or ureteral stones are identified. No perinephric stranding is appreciated. Stomach/Bowel: The stomach is unremarkable in appearance. The small bowel is within normal limits. The appendix is normal in caliber, without evidence of appendicitis. The colon is unremarkable in appearance. Mild wall thickening at the rectum could reflect mild chronic inflammation or proctitis. Vascular/Lymphatic: Scattered calcification is seen along the abdominal aorta and its branches. The abdominal aorta is otherwise grossly unremarkable. The inferior vena cava is grossly unremarkable. No retroperitoneal lymphadenopathy is seen. No pelvic sidewall lymphadenopathy is identified. Reproductive: The bladder is mildly distended and grossly unremarkable. The uterus is unremarkable in appearance. The ovaries are relatively symmetric. No suspicious adnexal masses are seen. Other: No additional soft tissue abnormalities are seen. Musculoskeletal: No acute osseous abnormalities are identified. Multilevel vacuum phenomenon is noted along the lower thoracic and lumbar spine. The visualized musculature is unremarkable in appearance. Review of the MIP images confirms the above findings. IMPRESSION: 1. No evidence of pulmonary embolus. 2. No acute abnormality seen to explain the Kelly Maxwell's symptoms. 3. Mild wall thickening at the rectum could reflect mild chronic inflammation or possibly proctitis. 4. Moderate hiatal hernia noted. 5. Bilateral atelectasis or scarring noted. Lungs otherwise clear. 6. Small right renal cyst noted. Aortic Atherosclerosis  (ICD10-I70.0). Electronically Signed   By: Garald Balding M.D.   On: 10/09/2017 00:07   Ct Abdomen Pelvis W Contrast  Result Date: 10/09/2017 CLINICAL DATA:  Acute onset of altered mental status. Generalized abdominal pain. Kelly Maxwell on radiation therapy for glial neoplasm. EXAM: CT ANGIOGRAPHY CHEST CT ABDOMEN AND PELVIS WITH CONTRAST TECHNIQUE: Multidetector CT imaging of the chest was performed using the standard protocol during bolus administration of intravenous contrast. Multiplanar CT image reconstructions and MIPs were obtained to evaluate the vascular anatomy. Multidetector CT imaging of the abdomen and pelvis was performed using the standard protocol during bolus administration of intravenous contrast. CONTRAST:  148mL ISOVUE-370 IOPAMIDOL (ISOVUE-370) INJECTION 76% COMPARISON:  CT of the abdomen and pelvis performed 09/02/2015, and CT of the chest performed 12/14/2011 FINDINGS: CTA CHEST FINDINGS Cardiovascular:  There is no evidence of pulmonary embolus. The heart is normal in size. The thoracic aorta is grossly unremarkable. The great vessels are within normal limits. Mediastinum/Nodes: The mediastinum is unremarkable in appearance. No mediastinal lymphadenopathy is seen. No pericardial effusion is identified. The left thyroid lobe is mildly heterogeneous but otherwise unremarkable. The right thyroid lobe is diminutive. No axillary lymphadenopathy is seen. A moderate hiatal hernia is noted. Lungs/Pleura: Bilateral atelectasis or scarring is noted. No pleural effusion or pneumothorax is seen. No masses are identified. Musculoskeletal: No acute osseous abnormalities are identified. The visualized musculature is unremarkable in appearance.  Review of the MIP images confirms the above findings. CT ABDOMEN and PELVIS FINDINGS Hepatobiliary: The liver is unremarkable in appearance. The gallbladder is unremarkable in appearance. The common bile duct remains normal in caliber. Pancreas: The pancreas is within  normal limits. Spleen: A few small hypodensities within the spleen may reflect small cysts. Adrenals/Urinary Tract: The adrenal glands are unremarkable in appearance. A small right renal cyst is noted. There is no evidence of hydronephrosis. No renal or ureteral stones are identified. No perinephric stranding is appreciated. Stomach/Bowel: The stomach is unremarkable in appearance. The small bowel is within normal limits. The appendix is normal in caliber, without evidence of appendicitis. The colon is unremarkable in appearance. Mild wall thickening at the rectum could reflect mild chronic inflammation or proctitis. Vascular/Lymphatic: Scattered calcification is seen along the abdominal aorta and its branches. The abdominal aorta is otherwise grossly unremarkable. The inferior vena cava is grossly unremarkable. No retroperitoneal lymphadenopathy is seen. No pelvic sidewall lymphadenopathy is identified. Reproductive: The bladder is mildly distended and grossly unremarkable. The uterus is unremarkable in appearance. The ovaries are relatively symmetric. No suspicious adnexal masses are seen. Other: No additional soft tissue abnormalities are seen. Musculoskeletal: No acute osseous abnormalities are identified. Multilevel vacuum phenomenon is noted along the lower thoracic and lumbar spine. The visualized musculature is unremarkable in appearance. Review of the MIP images confirms the above findings. IMPRESSION: 1. No evidence of pulmonary embolus. 2. No acute abnormality seen to explain the Kelly Maxwell's symptoms. 3. Mild wall thickening at the rectum could reflect mild chronic inflammation or possibly proctitis. 4. Moderate hiatal hernia noted. 5. Bilateral atelectasis or scarring noted. Lungs otherwise clear. 6. Small right renal cyst noted. Aortic Atherosclerosis (ICD10-I70.0). Electronically Signed   By: Garald Balding M.D.   On: 10/09/2017 00:07    Microbiology: No results found for this or any previous visit  (from the past 240 hour(s)).   Labs: Basic Metabolic Panel: Recent Labs  Lab 10/08/17 2119 10/08/17 2130 10/09/17 0417  NA 139 137 138  K 3.5 3.6 3.6  CL 107 105 109  CO2 22  --  21*  GLUCOSE 93 91 79  BUN 12 11 9   CREATININE 0.31* 0.30* <0.30*  CALCIUM 8.9  --  8.5*   Liver Function Tests: Recent Labs  Lab 10/08/17 2119  AST 30  ALT 31  ALKPHOS 62  BILITOT 0.5  PROT 5.9*  ALBUMIN 3.2*   Recent Labs  Lab 10/08/17 2119  LIPASE 44   No results for input(s): AMMONIA in the last 168 hours. CBC: Recent Labs  Lab 10/08/17 2130 10/08/17 2147  WBC  --  8.1  NEUTROABS  --  7.4  HGB 11.6* 11.5*  HCT 34.0* 36.3  MCV  --  95.5  PLT  --  235   Cardiac Enzymes: Recent Labs  Lab 10/08/17 2119  TROPONINI <0.03   BNP: BNP (last 3 results) No results for input(s): BNP in the last 8760 hours.  ProBNP (last 3 results) No results for input(s): PROBNP in the last 8760 hours.  CBG: Recent Labs  Lab 10/10/17 1203 10/10/17 1632 10/10/17 2309 10/11/17 0801 10/11/17 0835  GLUCAP 93 159* 127* 67* 86       Signed:  Glori Machnik  [facility] 10/11/2017, 10:36 AM

## 2017-10-10 NOTE — Evaluation (Signed)
Physical Therapy Evaluation Patient Details Name: Kelly Maxwell MRN: [id number] DOB: [birth year] Today's Date: 10/10/2017   History of Present Illness  72 y.o. female with a known history of glioblastoma diagnosed in 2013 status post radiation and chemotherapy, depression, hyperlipidemia presents to the emergency department for evaluation of AMS, weakness, with recent UTI and cleared for PE.  PMHx:  L frontal and temp lobe gliosarcoma, L eye ptosis, HTN, post chemo and radiation  Clinical Impression  Pt was assessed and likely will not have coverage for SNF but is at that care level.  If going home will need 2 person assist to transfer and attempt gait.  PT is to be continued acutely but pt did not get up to chair due to feeling too weak to attempt.  Follow for strengthening, balance and endurance as tolerated.    Follow Up Recommendations SNF    Equipment Recommendations  None recommended by PT(will need family to provide information about pt)    Recommendations for Other Services       Precautions / Restrictions Precautions Precautions: Fall Precaution Comments: on IV and has bed alarm Restrictions Weight Bearing Restrictions: No      Mobility  Bed Mobility Overal bed mobility: Needs Assistance Bed Mobility: Supine to Sit;Sit to Supine     Supine to sit: Mod assist;Max assist Sit to supine: Mod assist;Max assist   General bed mobility comments: assist to pivot and sit up initially then to return legs to bed and slide up with trendelenburg feature  Transfers                 General transfer comment: pt refused to attempt  Ambulation/Gait             General Gait Details: pt would not stand  Stairs            Wheelchair Mobility    Modified Rankin (Stroke Patients Only)       Balance Overall balance assessment: Needs assistance Sitting-balance support: Feet supported;Bilateral upper extremity supported Sitting balance-Leahy Scale: Fair                                       Pertinent Vitals/Pain Pain Assessment: No/denies pain    Home Living Family/patient expects to be discharged to:: Unsure Living Arrangements: Children               Additional Comments: pt is completely unable to tell PT her PLOF    Prior Function Level of Independence: Needs assistance   Gait / Transfers Assistance Needed: family assists her mobility but pt cannot say how much  ADL's / Homemaking Assistance Needed: family care for bathing and dressing        Hand Dominance   Dominant Hand: (unsure)    Extremity/Trunk Assessment   Upper Extremity Assessment Upper Extremity Assessment: Generalized weakness    Lower Extremity Assessment Lower Extremity Assessment: Generalized weakness    Cervical / Trunk Assessment Cervical / Trunk Assessment: Normal  Communication   Communication: Expressive difficulties(mumbling and cannot get out all information)  Cognition Arousal/Alertness: Lethargic Behavior During Therapy: Flat affect Overall Cognitive Status: History of cognitive impairments - at baseline                                 General Comments: according to staff who know pt the  pt was unable to tell ST information previously      General Comments      Exercises     Assessment/Plan    PT Assessment Patient needs continued PT services  PT Problem List Decreased range of motion;Decreased strength;Decreased activity tolerance;Decreased balance;Decreased mobility;Decreased coordination;Decreased cognition;Decreased safety awareness       PT Treatment Interventions DME instruction;Gait training;Functional mobility training;Therapeutic activities;Therapeutic exercise;Balance training;Neuromuscular re-education;Patient/family education    PT Goals (Current goals can be found in the Care Plan section)  Acute Rehab PT Goals Patient Stated Goal: none stated x to return to bed PT Goal Formulation:  Patient unable to participate in goal setting Time For Goal Achievement: 10/24/17 Potential to Achieve Goals: Fair    Frequency Min 3X/week   Barriers to discharge Decreased caregiver support home with daughter but not clear if she has other family    Co-evaluation               AM-PAC PT "6 Clicks" Daily Activity  Outcome Measure Difficulty turning over in bed (including adjusting bedclothes, sheets and blankets)?: Unable Difficulty moving from lying on back to sitting on the side of the bed? : Unable Difficulty sitting down on and standing up from a chair with arms (e.g., wheelchair, bedside commode, etc,.)?: Unable Help needed moving to and from a bed to chair (including a wheelchair)?: Total Help needed walking in hospital room?: Total Help needed climbing 3-5 steps with a railing? : Total 6 Click Score: 6    End of Session Equipment Utilized During Treatment: Gait belt Activity Tolerance: Patient limited by fatigue;Patient limited by lethargy Patient left: in bed;with call bell/phone within reach;with bed alarm set Nurse Communication: Mobility status PT Visit Diagnosis: Other abnormalities of gait and mobility (R26.89);Muscle weakness (generalized) (M62.81);Adult, failure to thrive (R62.7)    Time: 5701-7793 PT Time Calculation (min) (ACUTE ONLY): 24 min   Charges:   PT Evaluation $PT Eval Moderate Complexity: 1 Mod PT Treatments $Therapeutic Activity: 8-22 mins       Ramond Dial 10/10/2017, 10:44 AM   Mee Hives, PT MS Acute Rehab Dept. Number: [place] and [place]

## 2017-10-10 NOTE — Care Management Note (Signed)
Case Management Note  Patient Details  Name: Kelly Maxwell MRN: [id number] Date of Birth: [birth year]  Subjective/Objective:  Spoke to spouse [name]-he agrees to home w/HHC-he says she is doing better today than before. [facility] chosen for [facility] used them in the past.HHRN/PT/aide/ST/CSW. [facility] rep Santiago Glad aware of referral. MD/CSW updated.                  Action/Plan:d/c home w/HHC.   Expected Discharge Date:                  Expected Discharge Plan:  [place]  In-House Referral:  Clinical Social Work  Discharge planning Services  CM Consult  Post Acute Care Choice:    Choice offered to:  Adult Children  DME Arranged:    DME Agency:     HH Arranged:    HH Agency:     Status of Service:  In process, will continue to follow  If discussed at Long Length of Stay Meetings, dates discussed:    Additional Comments:  Dessa Phi, RN 10/10/2017, 3:16 PM

## 2017-10-10 NOTE — Progress Notes (Signed)
CSW following for discharge needs. Patient is going home with Home Health services as family indicates they are comfortable with taking the patient home. CSW informed [place] about patient plans. [place] will continue to work on authorization if patient gets home and is still requiring SNF placement and additional support. A social worker will be placed in the home to assist with this. FL2 was completed.  PASRR documentation was sent.   Plan: [place], [place], MSW Clinical Social Worker  [phone] 10/10/2017  3:39 PM

## 2017-10-11 ENCOUNTER — Inpatient Hospital Stay: Payer: Medicare HMO

## 2017-10-11 ENCOUNTER — Telehealth: Payer: Self-pay

## 2017-10-11 DIAGNOSIS — I10 Essential (primary) hypertension: Secondary | ICD-10-CM | POA: Diagnosis not present

## 2017-10-11 DIAGNOSIS — C719 Malignant neoplasm of brain, unspecified: Secondary | ICD-10-CM | POA: Diagnosis not present

## 2017-10-11 DIAGNOSIS — G9341 Metabolic encephalopathy: Secondary | ICD-10-CM | POA: Diagnosis not present

## 2017-10-11 DIAGNOSIS — E118 Type 2 diabetes mellitus with unspecified complications: Secondary | ICD-10-CM | POA: Diagnosis not present

## 2017-10-11 LAB — GLUCOSE, CAPILLARY
GLUCOSE-CAPILLARY: 86 mg/dL (ref 70–99)
GLUCOSE-CAPILLARY: 123 mg/dL — AB (ref 70–99)
Glucose-Capillary: 67 mg/dL — ABNORMAL LOW (ref 70–99)

## 2017-10-11 NOTE — Discharge Instructions (Signed)

## 2017-10-11 NOTE — Evaluation (Signed)
Occupational Therapy Evaluation Patient Details Name: Kelly Maxwell MRN: [id number] DOB: [birth year] Today's Date: 10/11/2017    History of Present Illness 72 y.o. female with a known history of glioblastoma diagnosed in 2013 status post radiation and chemotherapy, depression, hyperlipidemia presents to the emergency department for evaluation of AMS, weakness, with recent UTI and cleared for PE.  PMHx:  L frontal and temp lobe gliosarcoma, L eye ptosis, HTN, post chemo and radiation   Clinical Impression   Pt was admitted for the above.  At baseline, she lives with daughter and has assistance for adls. She needs extensive assistance with adls at this time but can stand and transfer with min A.  Will follow in acute setting with the goals stated below to maximize her participation in adls and decrease caregiver burden.  Recommend continued OT after acute stay. Pt presents with multiple problem areas including decreased vision, cognition, balance, activity tolerance.    Follow Up Recommendations  Home health OT;Supervision/Assistance - 24 hour;as long as daughter can manage.  Pt would benefit from SNF if family prefers   Equipment Recommendations    3:1 commode, if she doesn't have this   Recommendations for Other Services       Precautions / Restrictions Precautions Precautions: Fall Restrictions Weight Bearing Restrictions: No      Mobility Bed Mobility Overal bed mobility: Needs Assistance Bed Mobility: Supine to Sit;Sit to Supine    Rolling:  Min A to L; mod A to R with rails Supine to sit: Mod assist      Transfers                 General transfer comment: min A to stand and sidestep from chair.  Had used sara stedy for transfer for safety as pt had been in bed for several days    Balance Overall balance assessment: Needs assistance Sitting-balance support: Feet supported;Bilateral upper extremity supported Sitting balance-Leahy Scale: Fair       Standing  balance-Leahy Scale: Poor                             ADL either performed or assessed with clinical judgement   ADL Overall ADL's : Needs assistance/impaired Eating/Feeding: Minimal assistance   Grooming: Oral care;Minimal assistance   Upper Body Bathing: Minimal assistance   Lower Body Bathing: Maximal assistance   Upper Body Dressing : Minimal assistance   Lower Body Dressing: Total assistance   Toilet Transfer: Minimal assistance;Stand-pivot(chair)   Toileting- Clothing Manipulation and Hygiene: Maximal assistance         General ADL Comments: used sara stedy for OOB for safety then stood and sidestepped.  Min A overall for this.  Pt could not apply toothpaste to toothbrush     Vision   Additional Comments: could not apply toothpaste to brush; overshot.  ptosis, ? diplopia     Perception     Praxis      Pertinent Vitals/Pain       Hand Dominance Right(unsure)   Extremity/Trunk Assessment Upper Extremity Assessment Upper Extremity Assessment: Generalized weakness       Cervical / Trunk Assessment Cervical / Trunk Assessment: Normal   Communication Communication Communication: Expressive difficulties(slow to process uses wrong words)   Cognition Arousal/Alertness: Lethargic Behavior During Therapy: Flat affect Overall Cognitive Status: No family/caregiver present to determine baseline cognitive functioning  General Comments: delayed processing and initiation; decreased memory; difficulty with some one step commands   General Comments       Exercises     Shoulder Instructions      Home Living Family/patient expects to be discharged to:: Unsure Living Arrangements: Children                               Additional Comments: unable to state PLOF.  Per nursing, pt walked with RW and had assistance with ADLs      Prior Functioning/Environment Level of Independence: Needs  assistance  Gait / Transfers Assistance Needed: family assists her mobility but pt cannot say how much ADL's / Homemaking Assistance Needed: family care for bathing and dressing            OT Problem List: Decreased strength;Decreased activity tolerance;Impaired balance (sitting and/or standing);Decreased coordination;Decreased cognition;Decreased safety awareness;Impaired vision/perception      OT Treatment/Interventions: Self-care/ADL training;Therapeutic exercise;DME and/or AE instruction;Therapeutic activities;Cognitive remediation/compensation;Patient/family education;Visual/perceptual remediation/compensation;Balance training    OT Goals(Current goals can be found in the care plan section) Acute Rehab OT Goals Patient Stated Goal: hopes for home today OT Goal Formulation: Patient unable to participate in goal setting Time For Goal Achievement: 10/25/17 Potential to Achieve Goals: Good ADL Goals Pt Will Transfer to Toilet: with min assist;ambulating;bedside commode Pt Will Perform Toileting - Clothing Manipulation and hygiene: with mod assist;sit to/from stand Additional ADL Goal #1: pt will go from sit to stand with min guard and maintain balance with rw x 2 minutes for adls Additional ADL Goal #2: pt will follow one step commands 75% of time within 10 seconds  OT Frequency: Min 2X/week   Barriers to D/C:            Co-evaluation              AM-PAC PT "6 Clicks" Daily Activity     Outcome Measure Help from another person eating meals?: A Little Help from another person taking care of personal grooming?: A Little Help from another person toileting, which includes using toliet, bedpan, or urinal?: A Lot Help from another person bathing (including washing, rinsing, drying)?: A Lot Help from another person to put on and taking off regular upper body clothing?: A Little Help from another person to put on and taking off regular lower body clothing?: A Lot 6 Click Score:  15   End of Session Nurse Communication: Mobility status  Activity Tolerance: Patient limited by fatigue Patient left: in chair;with call bell/phone within reach;with chair alarm set  OT Visit Diagnosis: Unsteadiness on feet (R26.81);Muscle weakness (generalized) (M62.81)                Time: 1448-1856 OT Time Calculation (min): 39 min Charges:  OT General Charges $OT Visit: 1 Visit OT Evaluation $OT Eval Moderate Complexity: 1 Mod OT Treatments $Self Care/Home Management : 8-22 mins  Lesle Chris, OTR/L [phone] 10/11/2017  Brodan Grewell 10/11/2017, 10:21 AM

## 2017-10-11 NOTE — Telephone Encounter (Signed)
Received message from the patient daughter [name] stating that she feels like the Decadron 2 mg po that the patient has been taking is causing muscle wasting and mood changes with the patient. She is requesting to reduce the dose. Spoke with Dr. Mickeal Skinner and he gave verbal order to reduce the dose to 1 mg Decadron daily. This information was left as a message for [name] on her cell phone [phone].

## 2017-10-11 NOTE — Progress Notes (Signed)
Left message for dtr Erinn of d/c & [facility] w/[facility] HHRNPT/aide/CSW-to asst w/SNF to [place] if needed. [facility] does not have [place] not covered by insurance-they can pick it up @ any general superstore, or medical supply store if needed. No further CM needs.

## 2017-10-14 ENCOUNTER — Telehealth: Payer: Self-pay

## 2017-10-14 DIAGNOSIS — R2689 Other abnormalities of gait and mobility: Secondary | ICD-10-CM | POA: Diagnosis not present

## 2017-10-14 DIAGNOSIS — E860 Dehydration: Secondary | ICD-10-CM | POA: Diagnosis not present

## 2017-10-14 DIAGNOSIS — C712 Malignant neoplasm of temporal lobe: Secondary | ICD-10-CM | POA: Diagnosis not present

## 2017-10-14 NOTE — Telephone Encounter (Signed)
Received a message from [place] requesting a verbal okay to continue SN 2x/week x 2 weeks and ST for 1x/week for 2 weeks. She also stated that the patient and family feel that the patient feels better with IV hydration and that [facility] could manage this in the home but patient would need IV access. Left return message for Georgina Snell with an okay to continue SN and ST and that if the patient and family would like to pursue IV hydration then we would need to make a follow up appointment with Dr. Mickeal Skinner to have that discussion.

## 2017-10-15 DIAGNOSIS — R350 Frequency of micturition: Secondary | ICD-10-CM | POA: Diagnosis not present

## 2017-10-15 NOTE — Progress Notes (Signed)
FOLLOW UP PALLIATIVE CARE CONSULT VISIT   PATIENT NAME: Kelly Maxwell DOB: February 27, 1946 MRN: [id number]  REFERRING PROVIDER: Oncology, Physician, [place] [address]   RESPONSIBLE PARTY:  Acct [place] Phone Work Phone Relationship Acct Type  [id number] [name]  Self P/F     [address] [place], [place] [address]     BILLABLE ICD-10:  Weakness  R53.1:   Dysphagia  R13.10   RECOMMENDATIONS and PLAN:  1.  Weakness:  As related to advanced brain cancer.  Unchanged.  Increase nutrition and hydration. Pending PT/OT.  Total supportive care by family and private caregiver has increased.  2.  Dysphagia :  Limited hydration related to discomfort with swallowing with secondary effects of dehydration.   Pending speech therapy evaluation.  Continue use of Magic mouthwash.  Aspiration precautions  3.  Palliative care encounter:  Pending abdominal ultrasound due to distention and intermittent pain.  Family chooses to continue oral chemotherapy and FU with Dr. Mickeal Skinner.  She is to have full scope of treatment.  Goals are to remain at home for longterm treatment.  Palliative care will continue to follow.   I spent 30 minutes providing this consultation,  from 12:15pm  to 12:45pm at home.. More than 50% of the time in this consultation was spent coordinating communication with patient and spouse.    HISTORY OF PRESENT ILLNESS:  Follow-up with [name] finds that she continues to require full assistance with all ADLs and meals.  She remains hesitant to drink beverages due to discomfort with swallowing.  Husband reports that she is currently being treated for a UTI.  No reports of falls or seizure activities.   CODE STATUS: FULL CODE  ADVANCED DIRECTIVES: yes  PPS: 40% HOSPICE ELIGIBILITY/DIAGNOSIS: TBD/receiving chemotherapy  PHYSICAL EXAM:   GEN: Fatigued appearing elderly female sitting in chair and in NAD ENT:  Dry mucus  membranes LUNGS: clear throughout, unlabored respirations CARDIAC: RRR EXTREMITIES: No edema or ecchymosis SKIN: Exposed skin is intact NEURO: Alert and oriented to person and place PSYCH:  Calm and cooperative    Gonzella Lex, NP-C

## 2017-10-16 ENCOUNTER — Telehealth: Payer: Self-pay

## 2017-10-16 ENCOUNTER — Other Ambulatory Visit: Payer: Self-pay

## 2017-10-16 DIAGNOSIS — C719 Malignant neoplasm of brain, unspecified: Secondary | ICD-10-CM

## 2017-10-16 NOTE — Telephone Encounter (Signed)
Received a message from patient daughter Kelly Maxwell and a message from patient spouse Kelly Maxwell. Erin requested neuro rehab and Kelly Maxwell requested [facility] PT,OT, and ST. Spoke with Kelly Maxwell. He stated that they have a CNA that comes into the house Q day from 10-12 for personal care and that he does not think the patient could tolerate outpatient therapy as she sleeps the rest of the day after her bath. Explained that a new referral for [facility] would be placed for PT, OT, and ST. He also stated that he would like the patient to receive IV fluids in the home since she is unable to take in enough fluids and gets dehydrated. Explained that this was discussed with Dr. Mickeal Skinner and they would need a follow up appointment to have that discussion with Dr. Mickeal Skinner since the patient would need central line access and the risks associated with the procedure. Daine Gip agreed to this and a follow up appointment was scheduled.

## 2017-10-17 ENCOUNTER — Telehealth: Payer: Self-pay | Admitting: *Deleted

## 2017-10-17 NOTE — Telephone Encounter (Signed)
Herbert Deaner from [facility] [phone] called to see if he could get verbal orders to visit with the patient since her discharge from hospital for res establishment of care for OT/PT/Speech.  Verbal Approval given.

## 2017-10-18 DIAGNOSIS — R2689 Other abnormalities of gait and mobility: Secondary | ICD-10-CM | POA: Diagnosis not present

## 2017-10-18 DIAGNOSIS — C712 Malignant neoplasm of temporal lobe: Secondary | ICD-10-CM | POA: Diagnosis not present

## 2017-10-19 DIAGNOSIS — R269 Unspecified abnormalities of gait and mobility: Secondary | ICD-10-CM | POA: Diagnosis not present

## 2017-10-19 DIAGNOSIS — K436 Other and unspecified ventral hernia with obstruction, without gangrene: Secondary | ICD-10-CM | POA: Diagnosis not present

## 2017-10-19 DIAGNOSIS — M6281 Muscle weakness (generalized): Secondary | ICD-10-CM | POA: Diagnosis not present

## 2017-10-19 DIAGNOSIS — Z9181 History of falling: Secondary | ICD-10-CM | POA: Diagnosis not present

## 2017-10-22 DIAGNOSIS — R350 Frequency of micturition: Secondary | ICD-10-CM | POA: Diagnosis not present

## 2017-10-24 ENCOUNTER — Inpatient Hospital Stay (HOSPITAL_COMMUNITY)
Admission: EM | Admit: 2017-10-24 | Discharge: 2017-10-31 | DRG: 641 | Disposition: A | Discharge status: Home Health | Payer: Medicare HMO | Attending: Internal Medicine | Admitting: Internal Medicine

## 2017-10-24 ENCOUNTER — Encounter (HOSPITAL_COMMUNITY): Payer: Self-pay

## 2017-10-24 ENCOUNTER — Other Ambulatory Visit: Payer: Self-pay

## 2017-10-24 ENCOUNTER — Other Ambulatory Visit: Payer: Self-pay | Admitting: *Deleted

## 2017-10-24 ENCOUNTER — Other Ambulatory Visit: Payer: Medicare HMO | Admitting: Internal Medicine

## 2017-10-24 DIAGNOSIS — Z9221 Personal history of antineoplastic chemotherapy: Secondary | ICD-10-CM

## 2017-10-24 DIAGNOSIS — Z515 Encounter for palliative care: Secondary | ICD-10-CM | POA: Diagnosis present

## 2017-10-24 DIAGNOSIS — E876 Hypokalemia: Secondary | ICD-10-CM | POA: Diagnosis not present

## 2017-10-24 DIAGNOSIS — Z6824 Body mass index (BMI) 24.0-24.9, adult: Secondary | ICD-10-CM | POA: Diagnosis not present

## 2017-10-24 DIAGNOSIS — Z888 Allergy status to other drugs, medicaments and biological substances status: Secondary | ICD-10-CM | POA: Diagnosis not present

## 2017-10-24 DIAGNOSIS — R4182 Altered mental status, unspecified: Secondary | ICD-10-CM

## 2017-10-24 DIAGNOSIS — E860 Dehydration: Secondary | ICD-10-CM

## 2017-10-24 DIAGNOSIS — C719 Malignant neoplasm of brain, unspecified: Secondary | ICD-10-CM | POA: Diagnosis present

## 2017-10-24 DIAGNOSIS — K219 Gastro-esophageal reflux disease without esophagitis: Secondary | ICD-10-CM | POA: Diagnosis present

## 2017-10-24 DIAGNOSIS — Z885 Allergy status to narcotic agent status: Secondary | ICD-10-CM | POA: Diagnosis not present

## 2017-10-24 DIAGNOSIS — R1310 Dysphagia, unspecified: Secondary | ICD-10-CM | POA: Diagnosis not present

## 2017-10-24 DIAGNOSIS — T451X5A Adverse effect of antineoplastic and immunosuppressive drugs, initial encounter: Secondary | ICD-10-CM | POA: Diagnosis present

## 2017-10-24 DIAGNOSIS — E785 Hyperlipidemia, unspecified: Secondary | ICD-10-CM | POA: Diagnosis present

## 2017-10-24 DIAGNOSIS — B370 Candidal stomatitis: Secondary | ICD-10-CM | POA: Diagnosis present

## 2017-10-24 DIAGNOSIS — R64 Cachexia: Secondary | ICD-10-CM | POA: Diagnosis present

## 2017-10-24 DIAGNOSIS — E119 Type 2 diabetes mellitus without complications: Secondary | ICD-10-CM

## 2017-10-24 DIAGNOSIS — Z8249 Family history of ischemic heart disease and other diseases of the circulatory system: Secondary | ICD-10-CM

## 2017-10-24 DIAGNOSIS — R627 Adult failure to thrive: Secondary | ICD-10-CM | POA: Diagnosis not present

## 2017-10-24 DIAGNOSIS — I10 Essential (primary) hypertension: Secondary | ICD-10-CM | POA: Diagnosis not present

## 2017-10-24 DIAGNOSIS — Z85841 Personal history of malignant neoplasm of brain: Secondary | ICD-10-CM

## 2017-10-24 DIAGNOSIS — G9340 Encephalopathy, unspecified: Secondary | ICD-10-CM | POA: Diagnosis not present

## 2017-10-24 DIAGNOSIS — Z7984 Long term (current) use of oral hypoglycemic drugs: Secondary | ICD-10-CM

## 2017-10-24 DIAGNOSIS — R531 Weakness: Secondary | ICD-10-CM

## 2017-10-24 DIAGNOSIS — G9349 Other encephalopathy: Secondary | ICD-10-CM | POA: Diagnosis present

## 2017-10-24 DIAGNOSIS — F32A Depression, unspecified: Secondary | ICD-10-CM | POA: Diagnosis present

## 2017-10-24 DIAGNOSIS — D696 Thrombocytopenia, unspecified: Secondary | ICD-10-CM | POA: Diagnosis not present

## 2017-10-24 DIAGNOSIS — R4781 Slurred speech: Secondary | ICD-10-CM | POA: Diagnosis not present

## 2017-10-24 DIAGNOSIS — E118 Type 2 diabetes mellitus with unspecified complications: Secondary | ICD-10-CM | POA: Diagnosis not present

## 2017-10-24 DIAGNOSIS — C712 Malignant neoplasm of temporal lobe: Secondary | ICD-10-CM | POA: Diagnosis not present

## 2017-10-24 DIAGNOSIS — F329 Major depressive disorder, single episode, unspecified: Secondary | ICD-10-CM | POA: Diagnosis present

## 2017-10-24 DIAGNOSIS — R410 Disorientation, unspecified: Secondary | ICD-10-CM | POA: Diagnosis not present

## 2017-10-24 DIAGNOSIS — Z923 Personal history of irradiation: Secondary | ICD-10-CM

## 2017-10-24 DIAGNOSIS — R69 Illness, unspecified: Secondary | ICD-10-CM | POA: Diagnosis not present

## 2017-10-24 DIAGNOSIS — Z881 Allergy status to other antibiotic agents status: Secondary | ICD-10-CM | POA: Diagnosis not present

## 2017-10-24 DIAGNOSIS — Z7189 Other specified counseling: Secondary | ICD-10-CM | POA: Diagnosis not present

## 2017-10-24 DIAGNOSIS — R2689 Other abnormalities of gait and mobility: Secondary | ICD-10-CM | POA: Diagnosis not present

## 2017-10-24 DIAGNOSIS — F419 Anxiety disorder, unspecified: Secondary | ICD-10-CM | POA: Diagnosis present

## 2017-10-24 DIAGNOSIS — Z96652 Presence of left artificial knee joint: Secondary | ICD-10-CM | POA: Diagnosis present

## 2017-10-24 LAB — I-STAT CHEM 8, ED
BUN: 10 mg/dL (ref 8–23)
CREATININE: 0.3 mg/dL — AB (ref 0.44–1.00)
Calcium, Ion: 1.19 mmol/L (ref 1.15–1.40)
Chloride: 107 mmol/L (ref 98–111)
GLUCOSE: 103 mg/dL — AB (ref 70–99)
HEMATOCRIT: 31 % — AB (ref 36.0–46.0)
Hemoglobin: 10.5 g/dL — ABNORMAL LOW (ref 12.0–15.0)
POTASSIUM: 3.7 mmol/L (ref 3.5–5.1)
Sodium: 140 mmol/L (ref 135–145)
TCO2: 22 mmol/L (ref 22–32)

## 2017-10-24 LAB — CBC WITH DIFFERENTIAL/PLATELET
Basophils Absolute: 0 10*3/uL (ref 0.0–0.1)
Basophils Relative: 0 %
EOS PCT: 1 %
Eosinophils Absolute: 0 10*3/uL (ref 0.0–0.7)
HEMATOCRIT: 35.3 % — AB (ref 36.0–46.0)
Hemoglobin: 11.1 g/dL — ABNORMAL LOW (ref 12.0–15.0)
LYMPHS PCT: 8 %
Lymphs Abs: 0.4 10*3/uL — ABNORMAL LOW (ref 0.7–4.0)
MCH: 30 pg (ref 26.0–34.0)
MCHC: 31.4 g/dL (ref 30.0–36.0)
MCV: 95.4 fL (ref 78.0–100.0)
MONO ABS: 0.1 10*3/uL (ref 0.1–1.0)
MONOS PCT: 2 %
NEUTROS ABS: 4.4 10*3/uL (ref 1.7–7.7)
Neutrophils Relative %: 89 %
Platelets: 51 10*3/uL — ABNORMAL LOW (ref 150–400)
RBC: 3.7 MIL/uL — ABNORMAL LOW (ref 3.87–5.11)
RDW: 20.1 % — AB (ref 11.5–15.5)
WBC: 4.9 10*3/uL (ref 4.0–10.5)

## 2017-10-24 MED ORDER — DEXAMETHASONE 2 MG PO TABS
2.0000 mg | ORAL_TABLET | Freq: Every day | ORAL | Status: DC
  Administered 2017-10-26 – 2017-10-31 (×6): 2 mg via ORAL
  Filled 2017-10-24 (×7): qty 1

## 2017-10-24 MED ORDER — AMPHETAMINE-DEXTROAMPHETAMINE 10 MG PO TABS
30.0000 mg | ORAL_TABLET | Freq: Every day | ORAL | Status: DC
  Administered 2017-10-26 – 2017-10-31 (×6): 30 mg via ORAL
  Filled 2017-10-24 (×6): qty 3

## 2017-10-24 MED ORDER — KETOROLAC TROMETHAMINE 0.5 % OP SOLN
1.0000 [drp] | Freq: Every day | OPHTHALMIC | Status: DC
  Administered 2017-10-25 – 2017-10-31 (×7): 1 [drp] via OPHTHALMIC
  Filled 2017-10-24: qty 5

## 2017-10-24 MED ORDER — ONDANSETRON HCL 4 MG/2ML IJ SOLN: 4.0000 mg | Freq: Four times a day (QID) | INTRAMUSCULAR | Status: DC | PRN

## 2017-10-24 MED ORDER — ACETAMINOPHEN 650 MG RE SUPP: 650.0000 mg | Freq: Four times a day (QID) | RECTAL | Status: DC | PRN

## 2017-10-24 MED ORDER — SENNOSIDES-DOCUSATE SODIUM 8.6-50 MG PO TABS: 1.0000 | ORAL_TABLET | Freq: Every evening | ORAL | Status: DC | PRN

## 2017-10-24 MED ORDER — PANTOPRAZOLE SODIUM 40 MG PO TBEC
40.0000 mg | DELAYED_RELEASE_TABLET | Freq: Every day | ORAL | Status: DC
  Administered 2017-10-26 – 2017-10-31 (×6): 40 mg via ORAL
  Filled 2017-10-24 (×6): qty 1

## 2017-10-24 MED ORDER — INSULIN ASPART 100 UNIT/ML ~~LOC~~ SOLN: 0.0000 [IU] | SUBCUTANEOUS | Status: DC

## 2017-10-24 MED ORDER — AMLODIPINE BESYLATE 5 MG PO TABS
5.0000 mg | ORAL_TABLET | Freq: Every day | ORAL | Status: DC
Start: 2017-10-25 — End: 2017-10-31
  Administered 2017-10-26 – 2017-10-31 (×6): 5 mg via ORAL
  Filled 2017-10-24 (×6): qty 1

## 2017-10-24 MED ORDER — ATORVASTATIN CALCIUM 40 MG PO TABS
40.0000 mg | ORAL_TABLET | Freq: Every day | ORAL | Status: DC
  Administered 2017-10-25 – 2017-10-30 (×5): 40 mg via ORAL
  Filled 2017-10-24 (×5): qty 1

## 2017-10-24 MED ORDER — LACOSAMIDE 50 MG PO TABS: 50.0000 mg | ORAL_TABLET | Freq: Two times a day (BID) | ORAL | Status: DC

## 2017-10-24 MED ORDER — ACETAMINOPHEN 325 MG PO TABS
650.0000 mg | ORAL_TABLET | Freq: Four times a day (QID) | ORAL | Status: DC | PRN
  Administered 2017-10-25: 650 mg via ORAL
  Filled 2017-10-24: qty 2

## 2017-10-24 MED ORDER — SODIUM CHLORIDE 0.9 % IV BOLUS
1000.0000 mL | Freq: Once | INTRAVENOUS | Status: AC
  Administered 2017-10-24: 1000 mL via INTRAVENOUS

## 2017-10-24 MED ORDER — ONDANSETRON HCL 4 MG PO TABS: 4.0000 mg | ORAL_TABLET | Freq: Four times a day (QID) | ORAL | Status: DC | PRN

## 2017-10-24 MED ORDER — VITAMIN D3 25 MCG (1000 UNIT) PO TABS
1000.0000 [IU] | ORAL_TABLET | Freq: Every day | ORAL | Status: DC
  Administered 2017-10-26 – 2017-10-31 (×6): 1000 [IU] via ORAL
  Filled 2017-10-24 (×6): qty 1

## 2017-10-24 MED ORDER — BISACODYL 5 MG PO TBEC
5.0000 mg | DELAYED_RELEASE_TABLET | Freq: Every day | ORAL | Status: DC
  Administered 2017-10-26 – 2017-10-31 (×6): 5 mg via ORAL
  Filled 2017-10-24 (×6): qty 1

## 2017-10-24 MED ORDER — MAGIC MOUTHWASH W/LIDOCAINE
5.0000 mL | Freq: Four times a day (QID) | ORAL | Status: DC | PRN
  Filled 2017-10-24: qty 5

## 2017-10-24 MED ORDER — MELATONIN 5 MG PO TABS
10.0000 mg | ORAL_TABLET | Freq: Every day | ORAL | Status: DC
  Administered 2017-10-27 – 2017-10-28 (×2): 10 mg via ORAL
  Administered 2017-10-29 – 2017-10-30 (×2): 20 mg via ORAL
  Filled 2017-10-24: qty 2
  Filled 2017-10-24 (×7): qty 4

## 2017-10-24 MED ORDER — FENTANYL CITRATE (PF) 100 MCG/2ML IJ SOLN
25.0000 ug | INTRAMUSCULAR | Status: DC | PRN
  Administered 2017-10-28: 50 ug via INTRAVENOUS
  Filled 2017-10-24: qty 2

## 2017-10-24 MED ORDER — BOSWELLIA SERRATA EXTRACT POWD: 1.0000 | Freq: Every day | Status: DC

## 2017-10-24 MED ORDER — NYSTATIN 100000 UNIT/ML MT SUSP
5.0000 mL | Freq: Four times a day (QID) | OROMUCOSAL | Status: DC
  Administered 2017-10-25 – 2017-10-31 (×22): 500000 [IU] via ORAL
  Filled 2017-10-24 (×22): qty 5

## 2017-10-24 MED ORDER — DIFLUPREDNATE 0.05 % OP EMUL
1.0000 [drp] | Freq: Two times a day (BID) | OPHTHALMIC | Status: DC
  Administered 2017-10-28 – 2017-10-30 (×3): 1 [drp] via OPHTHALMIC
  Filled 2017-10-24: qty 5

## 2017-10-24 MED ORDER — SODIUM CHLORIDE 0.45 % IV SOLN
INTRAVENOUS | Status: DC
  Administered 2017-10-25: 01:00:00 via INTRAVENOUS
  Filled 2017-10-24 (×3): qty 1000

## 2017-10-24 NOTE — H&P (Signed)
History and Physical    Kelly Maxwell RDE:[id number] DOB: 09-06-1945 DOA: 10/24/2017  PCP: Wenda Low, MD   Patient coming from: Home   Chief Complaint: Mouth sores, refusing to eat or drink x2 days   HPI: Kelly Maxwell is a 72 y.o. 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  Per discussion with family at the bedside, she has been refusing to eat or drink for the past 2 days, was seen by home health today, suspected to have thrush, and was started on nystatin which she did take a dose of at home.  She has had recent admissions with AMS that was felt to be secondary, at least in part, to dehydration.  She is followed by palliative care, remains full code, but family has indicated that they are in favor of transition to hospice if she is unable to continue on chemotherapy.  Patient does not express any specific complaints, but family reports that she seems to be in pain when she tries to drink or eat recently. SNF was recommended at time of recent hospital discharge but family elected to take her home. She has had RLE swelling that was evaluated with negative venous US (she also had CTA chest negative for PE) during the admission earlier this month.   ED Course: Upon arrival to the ED, patient is found to be afebrile, saturating well on room air, and with vitals otherwise normal.  Chemistry panel is unremarkable and CBC is notable for thrombocytopenia with platelets 51,000.  Patient was given a liter of normal saline in the ED.  She remains  hemodynamically stable, and in no apparent respiratory distress, and will be observed in the hospital for ongoing evaluation and management of refusal to eat or drink, suspected secondary to mouth sores, and with resulting dehydration.  Review of Systems:  All other systems reviewed and apart from HPI, are negative.  Past Medical History:  Diagnosis Date  . Cancer ([place])   . Depression   . GERD (gastroesophageal reflux disease)   . Glial neoplasm of brain ([place]) 12/17/11   left temporal, grade IV, 2.7 x 3.6 x 2.5 cm ring enhancing; numerous small surrounding satellite lesions 12/13/11  . Headache(784.0)   . Hiatal hernia   . History of radiation therapy 01/07/2012-02/18/12   left temporal  60GY  . HLD (hyperlipidemia)   . PONV (postoperative nausea and vomiting)   . 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 BUNIONECTOMY  2001   right  . CATARACT EXTRACTION Left   . CRANIOTOMY  12/17/2011   Procedure: CRANIOTOMY TUMOR EXCISION;  Surgeon: Otilio Connors, MD;  Location: [facility];  Service: Neurosurgery;  Laterality: Left;  LEFT temporal craniotomy with stealth for tumor resection  . DILATION AND CURETTAGE OF UTERUS  2007  . TOTAL KNEE ARTHROPLASTY  2000   left  . VENTRAL HERNIA REPAIR N/A 09/03/2015   Procedure: LAPAROSCOPIC repair of incarcerated spigelian hernia with mesh, reduction and repair ;  Surgeon: [name], MD;  Location: [facility]S;  Service: General;  Laterality: N/A;     reports that she has never smoked. She has never used smokeless tobacco. She reports that she does not drink alcohol or use drugs.  Allergies  Allergen Reactions  . Ciprofloxacin     AMS  . Codeine Nausea And Vomiting and Nausea Only  . 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    . Compazine  [Prochlorperazine Edisylate] Other (See Comments) and Rash    Became hyper Became hyper    Family History  Problem Relation Age of Onset  . Heart disease Mother      Prior to Admission medications   Medication Sig Start Date End Date Taking? Authorizing Provider  amLODipine (NORVASC) 5 MG tablet Take 1 tablet (5 mg total) by mouth daily. 08/26/17  Yes Vaslow, Acey Lav, MD  amphetamine-dextroamphetamine (ADDERALL) 30 MG tablet Take 1 tablet by mouth daily. 09/23/17  Yes Vaslow, Acey Lav, MD  atorvastatin (LIPITOR) 40 MG tablet Take 40 mg by mouth daily.    Yes [provider]  bisacodyl (DULCOLAX) 5 MG EC tablet Take 5 mg by mouth daily.   Yes [provider]  Steva Colder Extract POWD Take 1 packet by mouth daily.    Yes [provider]  Bromfenac Sodium (PROLENSA) 0.07 % SOLN Place 1 drop into the left eye daily. Reported on 09/02/2015    Yes [provider]  cholecalciferol (VITAMIN D) 1000 units tablet Take 1 tablet (1,000 Units total) by mouth daily. 06/20/17  Yes Vaslow, Acey Lav, MD  dexamethasone (DECADRON) 2 MG tablet Take 1 tablet (2 mg total) by mouth daily. 08/09/17  Yes Vaslow, Acey Lav, MD  Diaper Rash Products ([employer] DIAPER) 1-10 % CREA APPLY EXTERNALLY AS DIRECTED AS NEEDED WITH EACH [facility] CHANGE 09/06/17  Yes [provider]  Difluprednate (DUREZOL) 0.05 % EMUL Place 1 drop into the left eye 2 (two) times daily.    Yes [provider]  lacosamide (VIMPAT) 50 MG TABS tablet Take 1 tablet (50 mg total) by mouth 2 (two) times daily. 06/24/17  Yes Vaslow, Acey Lav, MD  Melatonin 5 MG CAPS Take 10-20 mg by mouth at bedtime.    Yes [provider]  metFORMIN (GLUCOPHAGE) 500 MG tablet Take 500 mg by mouth 2 (two) times daily with a meal.    Yes [provider]  NON FORMULARY Take 1 capsule by mouth 2 (two) times daily. Lazarus Natural CBD Oil    Yes [provider]  nystatin (MYCOSTATIN) 100000  UNIT/ML suspension Take 5 mLs by mouth 4 (four) times daily. Swish and spit out 10/24/17  Yes [provider]  nystatin cream (MYCOSTATIN) Apply 1 application topically as needed for dry skin.  08/21/17  Yes [provider]  ondansetron (ZOFRAN) 8 MG tablet Take 1 tablets (8mg ) by mouth once 30-60 prior to lomustine and Q 8h prn N/V 09/25/17  Yes Vaslow, Acey Lav, MD  pantoprazole (PROTONIX) 40 MG tablet Take 1 tablet (40 mg total) by mouth daily. 08/09/17  Yes Vaslow, Acey Lav, MD  senna-docusate (SENOKOT-S) 8.6-50 MG tablet Take 1 tablet by mouth at bedtime as needed for mild constipation. 07/19/17  Yes Eugenie Filler, MD  clonazePAM (KLONOPIN) 0.5 MG tablet Take 1 tablet (0.5 mg total) by mouth at bedtime as needed for anxiety. Patient not taking: Reported on 10/24/2017 07/19/17   Eugenie Filler, MD  GLEOSTINE 10 MG capsule Take 10 mg by mouth daily. 09/26/17   [provider]  GLEOSTINE 100 MG capsule Take 100 mg by mouth as directed.  09/26/17   [provider]  GLEOSTINE 40 MG capsule Take 40 mg by mouth daily. 09/26/17   [provider]    Physical Exam: Vitals:   10/24/17 2200 10/24/17 2230 10/24/17 2300 10/24/17 2312  BP: 123/68 126/63 136/72 136/72  Pulse: 65 62 73 65  Resp: 16 20 (!) 23 (!) 23  Temp:      TempSrc:      SpO2: 97% 98% 100% 98%  Weight:      Height:          Constitutional: NAD, calm Eyes: PERTLA, lids and conjunctivae normal ENMT: Lips are dry, patient refuses to open mouth for exam despite encouragement of family.    Neck: normal, supple, no masses, no thyromegaly Respiratory: clear to auscultation bilaterally, no wheezing, no crackles. Normal respiratory effort.    Cardiovascular: S1 & S2 heard, regular rate and rhythm. Right leg swelling. No significant JVD. Abdomen: No distension, soft, no guarding. Bowel sounds active.  Musculoskeletal: no clubbing / cyanosis. No joint deformity upper and lower extremities.     Skin: Scattered ecchymoses and small skin tears to extremities. Poor turgor. Neurologic: CN 2-12 grossly intact. Sensation intact. Moving all extremities.  Psychiatric: Alert, makes eye contact. Oriented to self only.     Labs on Admission: I have personally reviewed following labs and imaging studies  CBC: Recent Labs  Lab 10/24/17 2116 10/24/17 2136  WBC 4.9  --   NEUTROABS 4.4  --   HGB 11.1* 10.5*  HCT 35.3* 31.0*  MCV 95.4  --   PLT 51*  --    Basic Metabolic Panel: Recent Labs  Lab 10/24/17 2136  NA 140  K 3.7  CL 107  GLUCOSE 103*  BUN 10  CREATININE 0.30*   GFR: Estimated Creatinine Clearance: 54.1 mL/min (A) (by C-G formula based on SCr of 0.3 mg/dL (L)). Liver Function Tests: No results for input(s): AST, ALT, ALKPHOS, BILITOT, PROT, ALBUMIN in the last 168 hours. No results for input(s): LIPASE, AMYLASE in the last 168 hours. No results for input(s): AMMONIA in the last 168 hours. Coagulation Profile: No results for input(s): INR, PROTIME in the last 168 hours. Cardiac Enzymes: No results for input(s): CKTOTAL, CKMB, CKMBINDEX, TROPONINI in the last 168 hours. BNP (last 3 results) No results for input(s): PROBNP in the last 8760 hours. HbA1C: No results for input(s): HGBA1C in the last 72 hours. CBG: No results for input(s): GLUCAP in the last 168 hours. Lipid Profile: No results for input(s): CHOL, HDL, LDLCALC, TRIG, CHOLHDL, LDLDIRECT in the last 72 hours. Thyroid Function Tests: No results for input(s): TSH, T4TOTAL, FREET4, T3FREE, THYROIDAB in the last 72 hours. Anemia Panel: No results for input(s): VITAMINB12, FOLATE, FERRITIN, TIBC, IRON, RETICCTPCT in the last 72 hours. Urine analysis:    Component Value Date/Time   COLORURINE YELLOW 10/08/2017 1931   APPEARANCEUR CLEAR 10/08/2017 1931   LABSPEC 1.016 10/08/2017 1931   PHURINE 6.0 10/08/2017 1931   GLUCOSEU NEGATIVE 10/08/2017 1931   HGBUR NEGATIVE 10/08/2017 1931   BILIRUBINUR  NEGATIVE 10/08/2017 1931   KETONESUR 5 (A) 10/08/2017 1931   PROTEINUR NEGATIVE 10/08/2017 1931   NITRITE NEGATIVE 10/08/2017 1931   LEUKOCYTESUR NEGATIVE 10/08/2017 1931   Sepsis Labs: @LABRCNTIP (procalcitonin:4,lacticidven:4) )No results found for this or any previous visit (from the past 240 hour(s)).  Radiological Exams on Admission: No results found.  EKG: Not performed.   Assessment/Plan  1. Dehydration  - Presents with dehydration in setting of no oral intake for past 2 days that family and [place] RN suspect is secondary to thrush and mouth sores  - She was given 1 liter NS in ED  - She was prescribed nystatin swish and spit today, will continue  - Start anesthetic mouthwash, encourage oral intake, continue IVF hydration, repeat chemistries in am    2. Gliosarcoma  - She is status-post resection and radiation in 2013, currently with progression and on chemotherapy  - Continue Decadron, Vimpat for seizure ppx, and Adderal for associated fatigue   3. Thrombocytopenia  - Platelets 51,000 on admission  - No active bleeding  - Likely secondary to chemo    4. Hypertension  - BP at goal  - Continue Norvasc as tolerated    5. Type II DM  - No A1c on file  - Managed with metformin at home, held on admission  - Check CBG's and use a low-intensity SSI with Novolog while in hospital     DVT prophylaxis: SCD's  Code Status: Full  Family Communication: daughter and husband updated at bedside Consults called: None Admission status: Observation     Vianne Bulls, MD [facility] Pager [phone]  If 7PM-7AM, please contact night-coverage [email or web address] Password [facility]  10/24/2017, 11:31 PM

## 2017-10-24 NOTE — ED Notes (Signed)
Bed: WA20 Expected date:  Expected time:  Means of arrival:  Comments: Failure to thrive

## 2017-10-24 NOTE — ED Provider Notes (Signed)
[facility] DEPT Provider Note   CSN: [id number] Arrival date & time: 10/24/17  2054     History   Chief Complaint Chief Complaint  Patient presents with  . Dehydration    HPI Kelly Maxwell is a 72 y.o. female.  Patient has a history of gliosarcoma.  She is been getting chemotherapy.  Patient does not eat or drink very much and he becomes dehydrated and confused she is been admitted twice recently for this. last admission was 2 weeks ago.  Family states that she is becoming more confused no vomiting fever  The history is provided by a relative. No language interpreter was used.  Altered Mental Status   This is a recurrent problem. The current episode started more than 2 days ago. The problem has not changed since onset.Associated symptoms include confusion. Risk factors include a recent illness. Her past medical history does not include seizures.    Past Medical History:  Diagnosis Date  . Cancer ([place])   . Depression   . GERD (gastroesophageal reflux disease)   . 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 Headache(784.0)   . Hiatal hernia   . History of radiation therapy 01/07/2012-02/18/12   left temporal  60GY  . HLD (hyperlipidemia)   . PONV (postoperative nausea and vomiting)   . 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 Palliative care encounter 10/24/2017  . Dysphagia 10/24/2017  . DM2 (diabetes mellitus, type 2) ([place]) 10/09/2017  . Unsteady gait 09/19/2017  . Weakness generalized 09/19/2017  . Counseling regarding advanced care planning and goals of care 09/19/2017  . HTN (hypertension) 07/19/2017  . Acute metabolic encephalopathy 73/22/0254  . Dehydration 07/16/2017  . Incarcerated Spigelian  ventral hernia s/p lap repair with mesh 09/03/2015 09/03/2015  . Anxiety and depression 09/03/2015  . Personal history of other infectious and parasitic diseases 09/03/2015  . 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 HLD (hyperlipidemia)   . GERD (gastroesophageal reflux disease)   . PONV (postoperative nausea and vomiting)   . 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 Hiatal hernia     Past Surgical History:  Procedure Laterality Date  . BUNIONECTOMY  2001   right  . CATARACT EXTRACTION Left   . CRANIOTOMY  12/17/2011   Procedure: CRANIOTOMY TUMOR EXCISION;  Surgeon: Otilio Connors, MD;  Location: [facility];  Service: Neurosurgery;  Laterality: Left;  LEFT temporal craniotomy with stealth for tumor resection  . DILATION AND CURETTAGE OF UTERUS  2007  . TOTAL KNEE ARTHROPLASTY  2000   left  . VENTRAL HERNIA REPAIR N/A 09/03/2015   Procedure: LAPAROSCOPIC repair of incarcerated spigelian hernia with mesh, reduction and repair ;  Surgeon: [name], MD;  Location: [facility];  Service: General;  Laterality: N/A;     OB History   None      Home Medications    Prior to Admission medications   Medication Sig Start Date End Date Taking? Authorizing Provider  amLODipine (NORVASC) 5 MG tablet Take 1 tablet (5 mg total) by mouth daily. 08/26/17  Yes Vaslow, Acey Lav, MD  amphetamine-dextroamphetamine (ADDERALL) 30 MG tablet Take 1 tablet by mouth daily. 09/23/17  Yes Vaslow, Acey Lav, MD  atorvastatin (LIPITOR)  40 MG tablet Take 40 mg by mouth daily.    Yes [provider]  bisacodyl (DULCOLAX) 5 MG EC tablet Take 5 mg by mouth daily.   Yes [provider]  Steva Colder Extract POWD Take 1 packet by mouth daily.    Yes [provider]  Bromfenac Sodium (PROLENSA) 0.07 % SOLN Place 1 drop into the left eye daily. Reported on 09/02/2015    Yes [provider]  cholecalciferol (VITAMIN D) 1000 units tablet Take 1 tablet (1,000 Units  total) by mouth daily. 06/20/17  Yes Vaslow, Acey Lav, MD  dexamethasone (DECADRON) 2 MG tablet Take 1 tablet (2 mg total) by mouth daily. 08/09/17  Yes Vaslow, Acey Lav, MD  Diaper Rash Products ([employer] DIAPER) 1-10 % CREA APPLY EXTERNALLY AS DIRECTED AS NEEDED WITH EACH [facility] CHANGE 09/06/17  Yes [provider]  Difluprednate (DUREZOL) 0.05 % EMUL Place 1 drop into the left eye 2 (two) times daily.    Yes [provider]  lacosamide (VIMPAT) 50 MG TABS tablet Take 1 tablet (50 mg total) by mouth 2 (two) times daily. 06/24/17  Yes Vaslow, Acey Lav, MD  Melatonin 5 MG CAPS Take 10-20 mg by mouth at bedtime.    Yes [provider]  metFORMIN (GLUCOPHAGE) 500 MG tablet Take 500 mg by mouth 2 (two) times daily with a meal.    Yes [provider]  NON FORMULARY Take 1 capsule by mouth 2 (two) times daily. Lazarus Natural CBD Oil    Yes [provider]  nystatin (MYCOSTATIN) 100000 UNIT/ML suspension Take 5 mLs by mouth 4 (four) times daily. Swish and spit out 10/24/17  Yes [provider]  nystatin cream (MYCOSTATIN) Apply 1 application topically as needed for dry skin.  08/21/17  Yes [provider]  ondansetron (ZOFRAN) 8 MG tablet Take 1 tablets (8mg ) by mouth once 30-60 prior to lomustine and Q 8h prn N/V 09/25/17  Yes Vaslow, Acey Lav, MD  pantoprazole (PROTONIX) 40 MG tablet Take 1 tablet (40 mg total) by mouth daily. 08/09/17  Yes Vaslow, Acey Lav, MD  senna-docusate (SENOKOT-S) 8.6-50 MG tablet Take 1 tablet by mouth at bedtime as needed for mild constipation. 07/19/17  Yes Eugenie Filler, MD  clonazePAM (KLONOPIN) 0.5 MG tablet Take 1 tablet (0.5 mg total) by mouth at bedtime as needed for anxiety. Patient not taking: Reported on 10/24/2017 07/19/17   Eugenie Filler, MD  GLEOSTINE 10 MG capsule Take 10 mg by mouth daily. 09/26/17   [provider]  GLEOSTINE 100 MG capsule Take 100 mg by mouth as directed.  09/26/17   [provider]  GLEOSTINE 40 MG capsule Take 40 mg by mouth daily. 09/26/17   [provider]    Family History Family History  Problem Relation Age of Onset  . Heart disease Mother     Social History Social History   Tobacco Use  . Smoking status: Never Smoker  . Smokeless tobacco: Never Used  Substance Use Topics  . Alcohol use: No  . Drug use: No     Allergies   Ciprofloxacin; Codeine; Succinylcholine chloride; and Compazine [prochlorperazine edisylate]   Review of Systems Review of Systems  Unable to perform ROS: Mental status change  Psychiatric/Behavioral: Positive for confusion.     Physical Exam Updated Vital Signs BP (!) 135/96 (BP Location: Left Arm)   Pulse 72   Temp 98 F (36.7 C) (Oral)   Resp 17  Ht 5\' 4"  (1.626 m)   Wt 65 kg   SpO2 98%   BMI 24.60 kg/m   Physical Exam  Constitutional: She appears well-developed.  HENT:  Head: Normocephalic.  Oropharynx shows some ulcers, with mucous membranes dry  Eyes: Conjunctivae and EOM are normal. No scleral icterus.  Neck: Neck supple. No thyromegaly present.  Cardiovascular: Normal rate and regular rhythm. Exam reveals no gallop and no friction rub.  No murmur heard. Pulmonary/Chest: No stridor. She has no wheezes. She has no rales. She exhibits no tenderness.  Abdominal: She exhibits no distension. There is no tenderness. There is no rebound.  Musculoskeletal: Normal range of motion. She exhibits no edema.  Lymphadenopathy:    She has no cervical adenopathy.  Neurological: She exhibits normal muscle tone. Coordination normal.  Patient lethargic.  Able to answer few questions but very confused  Skin: No rash noted. No erythema.     ED Treatments / Results  Labs (all labs ordered are listed, but only abnormal results are displayed) Labs Reviewed  CBC WITH DIFFERENTIAL/PLATELET - Abnormal; Notable for the following components:      Result Value   RBC 3.70 (*)    Hemoglobin 11.1 (*)     HCT 35.3 (*)    RDW 20.1 (*)    Platelets 51 (*)    Lymphs Abs 0.4 (*)    All other components within normal limits  I-STAT CHEM 8, ED - Abnormal; Notable for the following components:   Creatinine, Ser 0.30 (*)    Glucose, Bld 103 (*)    Hemoglobin 10.5 (*)    HCT 31.0 (*)    All other components within normal limits  URINALYSIS, ROUTINE W REFLEX MICROSCOPIC    EKG None  Radiology No results found.  Procedures Procedures (including critical care time)  Medications Ordered in ED Medications  sodium chloride 0.9 % bolus 1,000 mL (1,000 mLs Intravenous New Bag/Given 10/24/17 2154)     Initial Impression / Assessment and Plan / ED Course  I have reviewed the triage vital signs and the nursing notes.  Pertinent labs & imaging results that were available during my care of the patient were reviewed by me and considered in my medical decision making (see chart for details).     CBC shows patient's platelets to be low chemistries unremarkable.  Patient was given a liter of IV fluids but is still confused.  She is to see oncology tomorrow to determine whether the chemotherapy is appropriate.  Patient will be admitted to medicine with oncology consult  Final Clinical Impressions(s) / ED Diagnoses   Final diagnoses:  Dehydration  Altered mental status, unspecified altered mental status type    ED Discharge Orders    None       Milton Ferguson, MD 10/24/17 2302

## 2017-10-24 NOTE — ED Notes (Signed)
ED TO INPATIENT HANDOFF REPORT  Name/Age/Gender Kelly Maxwell 72 y.o. female  Code Status Code Status History    Date Active Date Inactive Code Status Order ID Comments User Context   10/09/2017 0111 10/11/2017 1705 Full Code [id number]  [name], DO ED   07/16/2017 2238 07/19/2017 2154 Full Code [id number]  [name], DO Inpatient   09/03/2015 0605 09/06/2015 1440 Full Code [id number]  [name], MD Inpatient      Home/SNF/Other Home  Chief Complaint confusion,dehydration  Level of Care/Admitting Diagnosis ED Disposition    ED Disposition Condition [facility] Area: [facility] [[id number]]  Level of Care: Med-Surg [16]  Diagnosis: Dehydration [276.51.ICD-9-CM]  Admitting Physician: Vianne Bulls [[id number]]  Attending Physician: Vianne Bulls [[id number]]  PT Class (Do Not Modify): Observation [104]  PT Acc Code (Do Not Modify): Observation [10022]       Medical History Past Medical History:  Diagnosis Date  . Cancer ([place])   . Depression   . GERD (gastroesophageal reflux disease)   . 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 Headache(784.0)   . Hiatal hernia   . History of radiation therapy 01/07/2012-02/18/12   left temporal  60GY  . HLD (hyperlipidemia)   . PONV (postoperative nausea and vomiting)   . 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 Ciprofloxacin     AMS  . Codeine Nausea And Vomiting and Nausea Only  . 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    . Compazine [Prochlorperazine Edisylate] Other (See Comments) and Rash    Became hyper Became hyper    IV Location/Drains/Wounds Patient Lines/Drains/Airways  Status   Active Line/Drains/Airways    Name:   Placement date:   Placement time:   Site:   Days:   Peripheral IV 10/24/17 Left Hand   10/24/17    2108    Hand   less than 1          Labs/Imaging Results for orders placed or performed during the hospital encounter of 10/24/17 (from the past 48 hour(s))  CBC with Differential/Platelet     Status: Abnormal   Collection Time: 10/24/17  9:16 PM  Result Value Ref Range   WBC 4.9 4.0 - 10.5 K/uL   RBC 3.70 (L) 3.87 - 5.11 MIL/uL   Hemoglobin 11.1 (L) 12.0 - 15.0 g/dL   HCT 35.3 (L) 36.0 - 46.0 %   MCV 95.4 78.0 - 100.0 fL   MCH 30.0 26.0 - 34.0 pg   MCHC 31.4 30.0 - 36.0 g/dL   RDW 20.1 (H) 11.5 - 15.5 %   Platelets 51 (L) 150 - 400 K/uL    Comment: REPEATED TO VERIFY SPECIMEN CHECKED FOR CLOTS PLATELET COUNT CONFIRMED BY SMEAR    Neutrophils Relative % 89 %   Neutro Abs 4.4 1.7 - 7.7 K/uL   Lymphocytes Relative 8 %   Lymphs Abs 0.4 (L) 0.7 - 4.0 K/uL   Monocytes Relative 2 %   Monocytes Absolute 0.1 0.1 - 1.0 K/uL   Eosinophils Relative 1 %   Eosinophils Absolute 0.0 0.0 - 0.7 K/uL   Basophils Relative 0 %   Basophils Absolute 0.0 0.0 - 0.1 K/uL  Comment: Performed at [facility], [place] [address] [place], [place] [address]  I-stat chem 8, ed     Status: Abnormal   Collection Time: 10/24/17  9:36 PM  Result Value Ref Range   Sodium 140 135 - 145 mmol/L   Potassium 3.7 3.5 - 5.1 mmol/L   Chloride 107 98 - 111 mmol/L   BUN 10 8 - 23 mg/dL   Creatinine, Ser 0.30 (L) 0.44 - 1.00 mg/dL   Glucose, Bld 103 (H) 70 - 99 mg/dL   Calcium, Ion 1.19 1.15 - 1.40 mmol/L   TCO2 22 22 - 32 mmol/L   Hemoglobin 10.5 (L) 12.0 - 15.0 g/dL   HCT 31.0 (L) 36.0 - 46.0 %   No results found.  Pending Labs Unresulted Labs (From admission, onward)    Start     Ordered   10/24/17 2253  Urinalysis, Routine w reflex microscopic  Once,   R     10/24/17 2252          Vitals/Pain Today's Vitals   10/24/17 2112 10/24/17  2113 10/24/17 2118 10/24/17 2312  BP:   (!) 135/96 136/72  Pulse:   72 65  Resp:   17 (!) 23  Temp:   98 F (36.7 C)   TempSrc:   Oral   SpO2: 98%  98% 98%  Weight:  65 kg    Height:  5\' 4"  (1.626 m)    PainSc: 5        Isolation Precautions No active isolations  Medications Medications  sodium chloride 0.9 % bolus 1,000 mL (1,000 mLs Intravenous New Bag/Given 10/24/17 2154)    Mobility walks with device

## 2017-10-24 NOTE — Progress Notes (Signed)
FOLLOW UP PALLIATIVE CARE CONSULT VISIT   PATIENT NAME: Kelly Maxwell DOB: 1945/04/20 MRN: [id number]  REFERRING PROVIDER: Oncology, Physician Dr. Mickeal Skinner    RESPONSIBLE PARTY:  Acct ID - Guarantor Home Phone Work Phone Relationship Acct Type  [id number] [name]  Self P/F     [address], [place], [place] [address]     BILLABLE ICD-10:  Weakness  R53.1:   Dysphagia  R13.10   RECOMMENDATIONS and PLAN:  1.  Weakness:  As related to advanced brain cancer and complicated by poor hydration. Progressive requiring increase assistance with ADLs transfers and limited ambulation.  PT has ended. Continue passive exercises. Total supportive care by family and private caregivers.  2.  Dysphagia :  Limited hydration related to discomfort with swallowing with secondary effects of dehydration.   Pending speech therapy re-evaluation. New med ordered by PCP during home visit.  Aspiration precautions.  Offer mechanical soft foods and hydration.  3. Gliosarcoma-left temporal lobe  C71.9:  Increased confusion and instability.  Reports of intermittent headaches by pt. Family will discuss potential future plans during Oncology visit on 8/23. Spouse interested in repeat brain MRI and possible cessation of chemo. Comfort care with Hospice Care at home if chemotherapy is discontinued.  4.  Palliative care encounter Z51.5:  Pt remains a full code with full scope of treatment.  Goals are to remain at home for longterm treatment of cancer if effective. Family is interested in intravenous hydration at home.  Would caution against same due to potentials for fluid overload. Spouse is in favor of transition to Hospice care when no additional improvement or worsening of disease process is noted. Palliative care will continue to follow.  I spent 30 minutes providing this consultation,  From 2:45 pm  to 3:15  pm at home. More than 50% of the time in this consultation was spent coordinating  communication with patient and spouse.    HISTORY OF PRESENT ILLNESS:  Follow-up with Kelly Maxwell finds that she was admitted to the hospital from 8/6-10/11/17 due to altered mental status related to dehydration and UTI.  Husband reports that her weakness and confusion have increased since hospitalization.  No reports of falls.  She is still having difficulty swallowing and will decline beverages routinely.  She may have aprox 12oz of beverages per day.  Eating varies in degree.  She requires cueing for all activities.  Husband states that she did take oral chemotherpy agent and continue Decadron at a lower dose.  He is considering d/c of chemo due to side effects.  Pending appt with onology on 10/25/17 and plans on having further discussion at that time.    CODE STATUS: FULL CODE  ADVANCED DIRECTIVES: yes  PPS: 40% HOSPICE ELIGIBILITY/DIAGNOSIS: TBD/Gliosarcoma/receiving chemotherapy  PHYSICAL EXAM:   SLH:[id number] ill and frail appearing elderly female sitting in chair and in NAD ENT:  Dry mucus membranes with white coating of tongue.  Obvious discomfort noted with attempts to swallow.   LUNGS: clear throughout, unlabored respirations.  No coughing CARDIAC: RRR EXTREMITIES: No edema  SKIN: Multiple patches of purpura of BUE.  Exposed skin is intact NEURO: Alert and oriented to person.  Feeds self using utensil.  Confused speech noted.  Attempts to put exercise band on head as a head wrap. PSYCH:  Calm, uncooperative when beverages offered  Gonzella Lex, NP-C

## 2017-10-24 NOTE — ED Triage Notes (Signed)
Patient has thrush and mouth is sore and patient unable to eat or drink for 2 days

## 2017-10-25 ENCOUNTER — Ambulatory Visit: Payer: Medicare HMO | Admitting: Internal Medicine

## 2017-10-25 ENCOUNTER — Other Ambulatory Visit: Payer: Medicare HMO

## 2017-10-25 DIAGNOSIS — E118 Type 2 diabetes mellitus with unspecified complications: Secondary | ICD-10-CM | POA: Diagnosis not present

## 2017-10-25 DIAGNOSIS — Z888 Allergy status to other drugs, medicaments and biological substances status: Secondary | ICD-10-CM | POA: Diagnosis not present

## 2017-10-25 DIAGNOSIS — C719 Malignant neoplasm of brain, unspecified: Secondary | ICD-10-CM | POA: Diagnosis not present

## 2017-10-25 DIAGNOSIS — Z881 Allergy status to other antibiotic agents status: Secondary | ICD-10-CM

## 2017-10-25 DIAGNOSIS — R69 Illness, unspecified: Secondary | ICD-10-CM | POA: Diagnosis not present

## 2017-10-25 DIAGNOSIS — Z885 Allergy status to narcotic agent status: Secondary | ICD-10-CM

## 2017-10-25 DIAGNOSIS — G9340 Encephalopathy, unspecified: Secondary | ICD-10-CM

## 2017-10-25 DIAGNOSIS — R627 Adult failure to thrive: Secondary | ICD-10-CM

## 2017-10-25 DIAGNOSIS — D696 Thrombocytopenia, unspecified: Secondary | ICD-10-CM | POA: Diagnosis not present

## 2017-10-25 DIAGNOSIS — Z7189 Other specified counseling: Secondary | ICD-10-CM | POA: Diagnosis not present

## 2017-10-25 DIAGNOSIS — R531 Weakness: Secondary | ICD-10-CM | POA: Diagnosis not present

## 2017-10-25 DIAGNOSIS — Z6824 Body mass index (BMI) 24.0-24.9, adult: Secondary | ICD-10-CM | POA: Diagnosis not present

## 2017-10-25 DIAGNOSIS — R1310 Dysphagia, unspecified: Secondary | ICD-10-CM | POA: Diagnosis not present

## 2017-10-25 DIAGNOSIS — F419 Anxiety disorder, unspecified: Secondary | ICD-10-CM | POA: Diagnosis not present

## 2017-10-25 DIAGNOSIS — I10 Essential (primary) hypertension: Secondary | ICD-10-CM | POA: Diagnosis not present

## 2017-10-25 DIAGNOSIS — E860 Dehydration: Secondary | ICD-10-CM | POA: Diagnosis not present

## 2017-10-25 LAB — BASIC METABOLIC PANEL
ANION GAP: 7 (ref 5–15)
BUN: 10 mg/dL (ref 8–23)
CHLORIDE: 115 mmol/L — AB (ref 98–111)
CO2: 20 mmol/L — AB (ref 22–32)
Calcium: 8.1 mg/dL — ABNORMAL LOW (ref 8.9–10.3)
Creatinine, Ser: 0.31 mg/dL — ABNORMAL LOW (ref 0.44–1.00)
GFR calc non Af Amer: >60 mL/min (ref >60)
Glucose, Bld: 74 mg/dL (ref 70–99)
POTASSIUM: 3.7 mmol/L (ref 3.5–5.1)
Sodium: 142 mmol/L (ref 135–145)

## 2017-10-25 LAB — CBC WITH DIFFERENTIAL/PLATELET
Basophils Absolute: 0 10*3/uL (ref 0.0–0.1)
Basophils Relative: 0 %
Eosinophils Absolute: 0.1 10*3/uL (ref 0.0–0.7)
Eosinophils Relative: 2 %
HEMATOCRIT: 31.3 % — AB (ref 36.0–46.0)
HEMOGLOBIN: 9.9 g/dL — AB (ref 12.0–15.0)
LYMPHS ABS: 0.5 10*3/uL — AB (ref 0.7–4.0)
LYMPHS PCT: 14 %
MCH: 30.2 pg (ref 26.0–34.0)
MCHC: 31.6 g/dL (ref 30.0–36.0)
MCV: 95.4 fL (ref 78.0–100.0)
Monocytes Absolute: 0.1 10*3/uL (ref 0.1–1.0)
Monocytes Relative: 3 %
NEUTROS ABS: 3.1 10*3/uL (ref 1.7–7.7)
Neutrophils Relative %: 81 %
Platelets: 38 10*3/uL — ABNORMAL LOW (ref 150–400)
RBC: 3.28 MIL/uL — ABNORMAL LOW (ref 3.87–5.11)
RDW: 20 % — ABNORMAL HIGH (ref 11.5–15.5)
WBC: 3.8 10*3/uL — ABNORMAL LOW (ref 4.0–10.5)

## 2017-10-25 LAB — GLUCOSE, CAPILLARY
GLUCOSE-CAPILLARY: 112 mg/dL — AB (ref 70–99)
GLUCOSE-CAPILLARY: 119 mg/dL — AB (ref 70–99)
Glucose-Capillary: 60 mg/dL — ABNORMAL LOW (ref 70–99)
Glucose-Capillary: 71 mg/dL (ref 70–99)
Glucose-Capillary: 71 mg/dL (ref 70–99)
Glucose-Capillary: 72 mg/dL (ref 70–99)
Glucose-Capillary: 109 mg/dL — ABNORMAL HIGH (ref 70–99)
Glucose-Capillary: 90 mg/dL (ref 70–99)

## 2017-10-25 MED ORDER — ORAL CARE MOUTH RINSE
15.0000 mL | Freq: Two times a day (BID) | OROMUCOSAL | Status: DC
  Administered 2017-10-25 – 2017-10-30 (×9): 15 mL via OROMUCOSAL

## 2017-10-25 MED ORDER — ENSURE ENLIVE PO LIQD
237.0000 mL | Freq: Two times a day (BID) | ORAL | Status: DC
  Administered 2017-10-31: 237 mL via ORAL

## 2017-10-25 MED ORDER — DEXTROSE-NACL 5-0.45 % IV SOLN
INTRAVENOUS | Status: DC
  Administered 2017-10-25 – 2017-10-31 (×9): via INTRAVENOUS

## 2017-10-25 MED ORDER — LACOSAMIDE 50 MG PO TABS
50.0000 mg | ORAL_TABLET | Freq: Two times a day (BID) | ORAL | Status: DC
  Administered 2017-10-25 – 2017-10-31 (×13): 50 mg via ORAL
  Filled 2017-10-25 (×14): qty 1

## 2017-10-25 NOTE — Progress Notes (Signed)
PROGRESS NOTE    Kelly Maxwell  FIE:[id number] DOB: 1945/11/09 DOA: 10/24/2017 PCP: Wenda Low, MD   Brief Narrative:  HPI on 10/24/2017 by Dr. Mitzi Hansen Kelly Maxwell is a 72 y.o. 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  Per discussion with family at the bedside, she has been refusing to eat or drink for the past 2 days, was seen by home health today, suspected to have thrush, and was started on nystatin which she did take a dose of at home.  She has had recent admissions with AMS that was felt to be secondary, at least in part, to dehydration.  She is followed by palliative care, remains full code, but family has indicated that they are in favor of transition to hospice if she is unable to continue on chemotherapy.  Patient does not express any specific complaints, but family reports that she seems to be in pain when she tries to drink or eat recently. SNF was recommended at time of recent hospital discharge but family elected to take her home. She has had RLE swelling that was evaluated with negative venous US (she also had CTA chest negative for PE) during the admission earlier this month.  Assessment & Plan   Dehydration  -Patient presents with poor oral intake for the last several days -Possibly secondary to thrush and mouth sores -Continue IV fluids, nystatin swish and spit, mouthwash  Gliosarcoma -Status post radiation in 2013, currently with progression on chemotherapy -Followed by neuro-oncology Dr. Mickeal Skinner -Discussed  with Dr. Mickeal Skinner, will see the patient in consult -Continue Decadron, Vimpat, Adderall for associated fatigue  Thrombocytopenia -Suspect secondary to chemotherapy -Currently no active bleeding -Continue to monitor CBC  Essential hypertension -Continue amlodipine  Diabetes mellitus, type II -Metformin held -Continue insulin sliding scale with CBG monitoring  Chronic Normocytic anemia -hemoglobin currently 9.9 baseline approximately 10-11 -Currently on IV fluids, suspect some dilutional component -Monitor CBC  Goals of care -Discussed with Dr. Mickeal Skinner, feels that patient has poor quality of life and functional status, no further recommendations as far as treatment. Feels patient should proceed with hospice however daughter declines but is open to palliative care consult. -Palliative care consulted and appreciated  DVT Prophylaxis  SCDs  Code Status: Full  Family Communication: None at bedside  Disposition Plan: Observation  Consultants Neuro-Oncology Palliative care  Procedures  None  Antibiotics   Anti-infectives (From admission, onward)   None      Subjective:   Kelly Maxwell seen and examined today.  Currently only alert to self.   Objective:   Vitals:   10/25/17 0023 10/25/17 0457 10/25/17 0500 10/25/17 1435  BP: 124/65 127/75  118/74  Pulse: 66 61  71  Resp: 16 20    Temp: 97.7 F (36.5 C) (!) 97.5 F (36.4 C)  98.9 F (37.2 C)  TempSrc: Oral Oral  Oral  SpO2: 96% 98%  99%  Weight: 64.2 kg  64.2 kg   Height: 5\' 4"  (1.626 m)       Intake/Output Summary (Last 24 hours) at 10/25/2017 1641 Last data filed at  10/25/2017 0610 Gross per 24 hour  Intake 496.67 ml  Output -  Net 496.67 ml   Filed Weights   10/24/17 2113 10/25/17 0023 10/25/17 0500  Weight: 65 kg 64.2 kg 64.2 kg    Exam  General: Well developed, chronically ill appearing, NAD  HEENT: NCAT, mucous membranes dry  Neck: Supple  Cardiovascular: S1 S2 auscultated, RRR, no  murmur  Respiratory: Clear to auscultation bilaterally with equal chest rise  Abdomen: Soft, nontender, nondistended, + bowel sounds  Extremities: warm dry without cyanosis clubbing. RLE swelling  Neuro: AAOx1 (self only), does not follow commands  Skin: Scattered ecchymosis and small skin tears  Psych: Normal affect and demeanor with intact judgement and insight   Data Reviewed: I have personally reviewed following labs and imaging studies  CBC: Recent Labs  Lab 10/24/17 2116 10/24/17 2136 10/25/17 0354  WBC 4.9  --  3.8*  NEUTROABS 4.4  --  3.1  HGB 11.1* 10.5* 9.9*  HCT 35.3* 31.0* 31.3*  MCV 95.4  --  95.4  PLT 51*  --  38*   Basic Metabolic Panel: Recent Labs  Lab 10/24/17 2136 10/25/17 0354  NA 140 142  K 3.7 3.7  CL 107 115*  CO2  --  20*  GLUCOSE 103* 74  BUN 10 10  CREATININE 0.30* 0.31*  CALCIUM  --  8.1*   GFR: Estimated Creatinine Clearance: 54.1 mL/min (A) (by C-G formula based on SCr of 0.31 mg/dL (L)). Liver Function Tests: No results for input(s): AST, ALT, ALKPHOS, BILITOT, PROT, ALBUMIN in the last 168 hours. No results for input(s): LIPASE, AMYLASE in the last 168 hours. No results for input(s): AMMONIA in the last 168 hours. Coagulation Profile: No results for input(s): INR, PROTIME in the last 168 hours. Cardiac Enzymes: No results for input(s): CKTOTAL, CKMB, CKMBINDEX, TROPONINI in the last 168 hours. BNP (last 3 results) No results for input(s): PROBNP in the last 8760 hours. HbA1C: No results for input(s): HGBA1C in the last 72 hours. CBG: Recent Labs  Lab 10/25/17 0141 10/25/17 0453 10/25/17 0804 10/25/17 1415 10/25/17 1621  GLUCAP 71 71 72 109* 90   Lipid Profile: No results for input(s): CHOL, HDL, LDLCALC, TRIG, CHOLHDL, LDLDIRECT in the last 72 hours. Thyroid Function Tests: No results for input(s): TSH, T4TOTAL, FREET4, T3FREE, THYROIDAB in the last 72 hours. Anemia Panel: No results for input(s): VITAMINB12,  FOLATE, FERRITIN, TIBC, IRON, RETICCTPCT in the last 72 hours. Urine analysis:    Component Value Date/Time   COLORURINE YELLOW 10/08/2017 1931   APPEARANCEUR CLEAR 10/08/2017 1931   LABSPEC 1.016 10/08/2017 1931   PHURINE 6.0 10/08/2017 1931   GLUCOSEU NEGATIVE 10/08/2017 1931   HGBUR NEGATIVE 10/08/2017 1931   BILIRUBINUR NEGATIVE 10/08/2017 1931   KETONESUR 5 (A) 10/08/2017 1931   PROTEINUR NEGATIVE 10/08/2017 1931   NITRITE NEGATIVE 10/08/2017 1931   LEUKOCYTESUR NEGATIVE 10/08/2017 1931   Sepsis Labs: @LABRCNTIP (procalcitonin:4,lacticidven:4)  )No results found for this or any previous visit (from the past 240 hour(s)).    Radiology Studies: No results found.   Scheduled Meds: . amLODipine  5 mg Oral Daily  . amphetamine-dextroamphetamine  30 mg Oral Daily  . atorvastatin  40 mg Oral q1800  . bisacodyl  5 mg Oral Daily  . cholecalciferol  1,000 Units Oral Daily  . dexamethasone  2 mg Oral Daily  . Difluprednate  1 drop Left Eye BID  . feeding supplement (ENSURE ENLIVE)  237 mL Oral BID BM  . insulin aspart  0-9 Units Subcutaneous Q4H  . ketorolac  1 drop Left Eye Daily  . lacosamide  50 mg Oral BID  . mouth rinse  15 mL Mouth Rinse BID  . Melatonin  10-20 mg Oral QHS  . nystatin  5 mL Oral QID  . pantoprazole  40 mg Oral Daily   Continuous Infusions: . dextrose 5 % and 0.45% NaCl 75 mL/hr at 10/25/17 1052     LOS: 0 days   Time Spent in minutes   30 minutes  Leilanie Rauda D.O. on 10/25/2017 at 4:41 PM  Between 7am to 7pm - Please see pager noted on [email or web address]  After 7pm go to [email or web address]  And look for the night coverage person covering for me after hours  [facility] Office  [phone]

## 2017-10-25 NOTE — Evaluation (Signed)
Physical Therapy Evaluation Patient Details Name: Kelly Maxwell MRN: [id number] DOB: 06-23-1945 Today's Date: 10/25/2017   History of Present Illness  72 y.o. female with medical history significant for hypertension, type 2 diabetes mellitus, and gliosarcoma status post resection and radiation in 2013, currently on chemotherapy for progression of disease, and now presenting to the ED for evaluation of dehydration after 2 days of refusal to eat or drink  Clinical Impression  Pt admitted with above diagnosis. Pt currently with functional limitations due to the deficits listed below (see PT Problem List). +2 mod assist for bed mobility and bed to recliner transfer with RW. Pt is oriented to self only. SNF recommended. Pt will benefit from skilled PT to increase their independence and safety with mobility to allow discharge to the venue listed below.       Follow Up Recommendations SNF: 24 hour assistance    Equipment Recommendations  None recommended by PT   Recommendations for Other Services       Precautions / Restrictions Precautions Precautions: Fall Precaution Comments: pt is fearful of falling but hasn't actually fallen Restrictions Weight Bearing Restrictions: No      Mobility  Bed Mobility Overal bed mobility: Needs Assistance Bed Mobility: Supine to Sit     Supine to sit: Max assist     General bed mobility comments: max A to raise trunk, mod A to advance LEs to edge of bed  Transfers Overall transfer level: Needs assistance Equipment used: Rolling walker (2 wheeled) Transfers: Sit to/from [facility] Sit to Stand: Mod assist;+2 safety/equipment Stand pivot transfers: Mod assist;+2 safety/equipment       General transfer comment: mod A to rise and min A to take several pivotal steps to recliner; assist to power up and to manage RW, VCs for technique/hand placement  Ambulation/Gait             General Gait Details: pt declined  ambulation  Stairs            Wheelchair Mobility    Modified Rankin (Stroke Patients Only)       Balance   Sitting-balance support: Feet supported;Bilateral upper extremity supported Sitting balance-Leahy Scale: Poor(pt tended to lean L and posteriorly, seemed mostly that she wanted to return to bed but difficulty to assess true sitting balance bc of this)       Standing balance-Leahy Scale: Poor(relies on BUE support)                               Pertinent Vitals/Pain Pain Assessment: No/denies pain    Home Living Family/patient expects to be discharged to:: Private residence Living Arrangements: Spouse/significant other Available Help at Discharge: Family;Available 24 hours/day;Personal care attendant Type of Home: House Home Access: Ramped entrance     Home Layout: Two level;Able to live on main level with bedroom/bathroom Home Equipment: Gilford Rile - 2 wheels;Bedside commode;Tub bench;Wheelchair - manual Additional Comments: walked with RW with +2 assist at home; aide comes for 2 hours/day x 7 days/week    Prior Function Level of Independence: Needs assistance   Gait / Transfers Assistance Needed: +2 to walk with RW  ADL's / Homemaking Assistance Needed: private aide comes 7x/week for bathing/dressing        Hand Dominance   Dominant Hand: Right(unsure)    Extremity/Trunk Assessment   Upper Extremity Assessment Upper Extremity Assessment: Generalized weakness;Difficult to assess due to impaired cognition    Lower Extremity  Assessment Lower Extremity Assessment: Generalized weakness;Difficult to assess due to impaired cognition(pt actively extended R knee upon command but not LLE)    Cervical / Trunk Assessment Cervical / Trunk Assessment: Normal  Communication   Communication: Expressive difficulties(slow to process uses wrong words)  Cognition Arousal/Alertness: Awake/alert Behavior During Therapy: Flat affect Overall Cognitive  Status: History of cognitive impairments - at baseline                                 General Comments: delayed processing and initiation; decreased memory; difficulty with some one step commands, pt can state her name but not birthdate, spouse reports cognition fluctuates at baseline      General Comments      Exercises     Assessment/Plan    PT Assessment Patient needs continued PT services  PT Problem List Decreased range of motion;Decreased strength;Decreased activity tolerance;Decreased balance;Decreased mobility;Decreased coordination;Decreased cognition;Decreased safety awareness       PT Treatment Interventions DME instruction;Gait training;Functional mobility training;Therapeutic activities;Therapeutic exercise;Balance training;Neuromuscular re-education;Patient/family education    PT Goals (Current goals can be found in the Care Plan section)  Acute Rehab PT Goals Patient Stated Goal: spouse would like SNF  PT Goal Formulation: With family Time For Goal Achievement: 11/08/17 Potential to Achieve Goals: Fair    Frequency Min 3X/week   Barriers to discharge Decreased caregiver support husband stated managing pt's care is difficult     Co-evaluation               AM-PAC PT "6 Clicks" Daily Activity  Outcome Measure Difficulty turning over in bed (including adjusting bedclothes, sheets and blankets)?: Unable Difficulty moving from lying on back to sitting on the side of the bed? : Unable Difficulty sitting down on and standing up from a chair with arms (e.g., wheelchair, bedside commode, etc,.)?: Unable Help needed moving to and from a bed to chair (including a wheelchair)?: A Lot Help needed walking in hospital room?: Total Help needed climbing 3-5 steps with a railing? : Total 6 Click Score: 7    End of Session Equipment Utilized During Treatment: Gait belt Activity Tolerance: Patient limited by fatigue Patient left: with call bell/phone  within reach;in chair;with family/visitor present Nurse Communication: Mobility status PT Visit Diagnosis: Other abnormalities of gait and mobility (R26.89);Muscle weakness (generalized) (M62.81);Adult, failure to thrive (R62.7)    Time: 1227-1253 PT Time Calculation (min) (ACUTE ONLY): 26 min   Charges:   PT Evaluation $PT Eval Moderate Complexity: 1 Mod PT Treatments $Therapeutic Activity: 8-22 mins          Blondell Reveal Kistler 10/25/2017, 1:15 PM [phone]

## 2017-10-25 NOTE — Progress Notes (Signed)
Pt am plt- 38 , md notified

## 2017-10-25 NOTE — ED Notes (Signed)
RN asked this NT to transport @ 2330 to [place]. NT stated to RN transportation would be delayed due to pt care. Pt transported out of ED @ 0001.

## 2017-10-25 NOTE — Care Management Obs Status (Signed)
[place] NOTIFICATION   Patient Details  Name: Kelly Maxwell MRN: [id number] Date of Birth: 1945-03-29   Medicare Observation Status Notification Given:       Lynnell Catalan, RN 10/25/2017, 1:23 PM

## 2017-10-25 NOTE — Progress Notes (Signed)
Initial Nutrition Assessment  DOCUMENTATION CODES:   Not applicable  INTERVENTION:    Magic cup TID with meals, each supplement provides 290 kcal and 9 grams of protein  Ensure Enlive po BID, each supplement provides 350 kcal and 20 grams of protein  NUTRITION DIAGNOSIS:   Increased nutrient needs related to cancer and cancer related treatments as evidenced by estimated needs.  GOAL:   Patient will meet greater than or equal to 90% of their needs  MONITOR:   PO intake, Supplement acceptance, Weight trends, Labs, Diet advancement  REASON FOR ASSESSMENT:   Malnutrition Screening Tool    ASSESSMENT:   Patient with PMH significant for expressive aphasia, memory deficits, HTN, DM, and gluteal sarcoma s/p resection and radiation in 2013, currently on chemotherapy for progression of disease, and now presenting to the ED after 2 days of refusal to eat or drink. Admitted for dehydration.    Pt unable to answer RD questions. No family at bedside to provide nutrition history. Will try to obtain more information upon follow up if possible. Per H&P pt has been refusing to eat the last couple of days likely due to thrush. She is currently being treated for this. Pt has not attempted to eat this admission. RD to provide supplements to maximize protein and calories.   Unsure of pt's UBW. Records indicate pt weighed 153 lb 08/09/17 and 141 lb this admission (7.8% wt loss in 2 months, significant for time frame). Nutrition-Focused physical exam completed. Suspect pt could be nourished given recent wt loss. Will need to obtain dietary recall to confirm this.   Per MD: Pt is followed by palliative care and remains full code. Family has indicated if chemotherapy is unable to be continued they would be in favor of transition to hospice.   Medications reviewed and include: Vit D, decadron, D5 @ 75 ml/hr Labs reviewed.   NUTRITION - FOCUSED PHYSICAL EXAM:    Most Recent Value  Orbital Region  No  depletion  Upper Arm Region  No depletion  Thoracic and Lumbar Region  Unable to assess  Buccal Region  No depletion  Temple Region  Mild depletion  Clavicle Bone Region  No depletion  Clavicle and Acromion Bone Region  No depletion  Scapular Bone Region  Unable to assess  Dorsal Hand  No depletion  Patellar Region  No depletion  Anterior Thigh Region  No depletion  Posterior Calf Region  No depletion  Edema (RD Assessment)  None     Diet Order:   Diet Order            DIET DYS 3 Room service appropriate? Yes; Fluid consistency: Thin  Diet effective now              EDUCATION NEEDS:   Not appropriate for education at this time  Skin:  Skin Assessment: Reviewed RN Assessment  Last BM:  10/24/17  Height:   Ht Readings from Last 1 Encounters:  10/25/17 5\' 4"  (1.626 m)    Weight:   Wt Readings from Last 1 Encounters:  10/25/17 64.2 kg    Ideal Body Weight:  54.5 kg  BMI:  Body mass index is 24.29 kg/m.  Estimated Nutritional Needs:   Kcal:  1750-1950 kcal  Protein:  85-100 grams   Fluid:  >/= 1.7 L/day  Mariana Single RD, LDN Clinical Nutrition Pager # - [phone]

## 2017-10-25 NOTE — Progress Notes (Signed)
PHARMACIST - PHYSICIAN ORDER COMMUNICATION  CONCERNING: P&T Medication Policy on Herbal Medications  DESCRIPTION:  This patient's order for:  Boswellia   has been noted.  This product(s) is classified as an "herbal" or natural product. Due to a lack of definitive safety studies or FDA approval, nonstandard manufacturing practices, plus the potential risk of unknown drug-drug interactions while on inpatient medications, the Pharmacy and Therapeutics Committee does not permit the use of "herbal" or natural products of this type within [facility].   ACTION TAKEN: The pharmacy department is unable to verify this order at this time and your patient has been informed of this safety policy. Please reevaluate patient's clinical condition at discharge and address if the herbal or natural product(s) should be resumed at that time.   Thanks Dorrene German 10/25/2017 12:19 AM

## 2017-10-25 NOTE — Consult Note (Signed)
[facility] CONSULT NOTE  Patient Care Team: Wenda Low, MD as PCP - General (Internal Medicine) [name], MD as Consulting Physician (Radiation Oncology) [name], MD as Medical Oncologist (Medical Oncology)  CHIEF COMPLAINTS/PURPOSE OF CONSULTATION:  Altered mental status  HISTORY OF PRESENTING ILLNESS:  Kelly Maxwell 72 y.o. female presented with decreased alertness, increased drowsiness and sleep volume.  This was consistent with her similar presentation 2 weeks prior, which resulted in return to baseline after IV fluid resuscitation.  Daughter describes recent decrease in PO intake, more with fluids than solid food.  Home speech therapist was able to identify oral thrush which she recently started treatment for.  Overall she has been minimally communicative with brief periods of lucidity and "high energy" per daughter.  MEDICAL HISTORY:  Past Medical History:  Diagnosis Date  . Cancer ([place])   . Depression   . GERD (gastroesophageal reflux disease)   . 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 Headache(784.0)   . Hiatal hernia   . History of radiation therapy 01/07/2012-02/18/12   left temporal  58GY  . HLD (hyperlipidemia)   . PONV (postoperative nausea and vomiting)   . 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 BUNIONECTOMY  2001   right  . CATARACT EXTRACTION Left   . CRANIOTOMY  12/17/2011   Procedure: CRANIOTOMY TUMOR EXCISION;  Surgeon: Otilio Connors, MD;  Location: [facility];  Service: Neurosurgery;  Laterality: Left;  LEFT temporal craniotomy with stealth for tumor resection  . DILATION AND CURETTAGE OF UTERUS  2007  . TOTAL KNEE ARTHROPLASTY  2000   left  .  VENTRAL HERNIA REPAIR N/A 09/03/2015   Procedure: LAPAROSCOPIC repair of incarcerated spigelian hernia with mesh, reduction and repair ;  Surgeon: [name], MD;  Location: [facility];  Service: General;  Laterality: N/A;    SOCIAL HISTORY: Social History   Socioeconomic History  . Marital status: Married    Spouse name: Not on file  . Number of children: 3  . Years of education: Not on file  . Highest education level: Not on file  Occupational History  . Not on file  Social Needs  . Financial resource strain: Not on file  . Food insecurity:    Worry: Not on file    Inability: Not on file  . Transportation needs:    Medical: Not on file    Non-medical: Not on file  Tobacco Use  . Smoking status: Never Smoker  . Smokeless tobacco: Never Used  Substance and Sexual Activity  . Alcohol use: No  . Drug use: No  . Sexual activity: Yes  Lifestyle  . Physical activity:    Days per week: Not on file    Minutes per session: Not on file  . Stress: Not on file  Relationships  . Social connections:    Talks on phone: Not on file    Gets together: Not on file    Attends religious service: Not on file    Active member of club or organization: Not on file    Attends meetings of clubs or organizations: Not on file    Relationship status: Not on file  . Intimate partner violence:    Fear  of current or ex partner: Not on file    Emotionally abused: Not on file    Physically abused: Not on file    Forced sexual activity: Not on file  Other Topics Concern  . Not on file  Social History Narrative   2nd marriage of 6 years, previously widowed    FAMILY HISTORY: Family History  Problem Relation Age of Onset  . Heart disease Mother     ALLERGIES:  is allergic to ciprofloxacin; codeine; succinylcholine chloride; and compazine [prochlorperazine edisylate].  MEDICATIONS:  Current Facility-Administered Medications  Medication Dose Route Frequency Provider Last Rate Last Dose  .  acetaminophen (TYLENOL) tablet 650 mg  650 mg Oral Q6H PRN Opyd, Ilene Qua, MD       Or  . acetaminophen (TYLENOL) suppository 650 mg  650 mg Rectal Q6H PRN Opyd, Ilene Qua, MD      . amLODipine (NORVASC) tablet 5 mg  5 mg Oral Daily Opyd, Ilene Qua, MD      . amphetamine-dextroamphetamine (ADDERALL) tablet 30 mg  30 mg Oral Daily Opyd, Ilene Qua, MD      . atorvastatin (LIPITOR) tablet 40 mg  40 mg Oral q1800 Opyd, Ilene Qua, MD      . bisacodyl (DULCOLAX) EC tablet 5 mg  5 mg Oral Daily Opyd, Ilene Qua, MD      . cholecalciferol (VITAMIN D) tablet 1,000 Units  1,000 Units Oral Daily Opyd, Ilene Qua, MD      . dexamethasone (DECADRON) tablet 2 mg  2 mg Oral Daily Opyd, Timothy S, MD      . dextrose 5 %-0.45 % sodium chloride infusion   Intravenous Continuous Sihaam, [place], DO 75 mL/hr at 10/25/17 1052    . Difluprednate 0.05 % EMUL 1 drop  1 drop Left Eye BID Opyd, Ilene Qua, MD      . feeding supplement (ENSURE ENLIVE) (ENSURE ENLIVE) liquid 237 mL  237 mL Oral BID BM Opyd, Ilene Qua, MD      . fentaNYL (SUBLIMAZE) injection 25-50 mcg  25-50 mcg Intravenous Q2H PRN Opyd, Ilene Qua, MD      . insulin aspart (novoLOG) injection 0-9 Units  0-9 Units Subcutaneous Q4H Opyd, Ilene Qua, MD      . ketorolac (ACULAR) 0.5 % ophthalmic solution 1 drop  1 drop Left Eye Daily Opyd, Ilene Qua, MD   1 drop at 10/25/17 1143  . lacosamide (VIMPAT) tablet 50 mg  50 mg Oral BID Opyd, Ilene Qua, MD   50 mg at 10/25/17 0112  . magic mouthwash w/lidocaine  5 mL Oral QID PRN Opyd, Ilene Qua, MD      . MEDLINE mouth rinse  15 mL Mouth Rinse BID Harlym, Gehling, DO   15 mL at 10/25/17 1000  . Melatonin TABS 10-20 mg  10-20 mg Oral QHS Opyd, Ilene Qua, MD      . nystatin (MYCOSTATIN) 100000 UNIT/ML suspension 500,000 Units  5 mL Oral QID Opyd, Ilene Qua, MD   500,000 Units at 10/25/17 1055  . ondansetron (ZOFRAN) tablet 4 mg  4 mg Oral Q6H PRN Opyd, Ilene Qua, MD       Or  . ondansetron (ZOFRAN) injection 4 mg  4  mg Intravenous Q6H PRN Opyd, Ilene Qua, MD      . pantoprazole (PROTONIX) EC tablet 40 mg  40 mg Oral Daily Opyd, Ilene Qua, MD      . senna-docusate (Senokot-S) tablet 1 tablet  1 tablet Oral QHS PRN  Vianne Bulls, MD        REVIEW OF SYSTEMS:   Limited by AMS   PHYSICAL EXAMINATION: Vitals:   10/25/17 0023 10/25/17 0457  BP: 124/65 127/75  Pulse: 66 61  Resp: 16 20  Temp: 97.7 F (36.5 C) (!) 97.5 F (36.4 C)  SpO2: 96% 98%   KPS: 50. General: Sleeping Head: Normal EENT: No conjunctival injection or scleral icterus. Oral mucosa moist Lungs: Resp effort normal Cardiac: Regular rate and rhythm Abdomen: Soft, non-distended abdomen Skin: No rashes cyanosis or petechiae. Extremities: +le edema  Neurologic Exam: Mental Status: Not alert, not attentive. Language is impaired to fluency and comprehension.   Cranial Nerves: Visual acuity is grossly normal. Extra-ocular movements intact. Left eye ptosis, chronic. Face is symmetric Motor: Both arms antigravity, legs move along bed roughly symmetric. Sensory:Grossly intact Gait: Deferred today  LABORATORY DATA:  I have reviewed the data as listed Lab Results  Component Value Date   WBC 3.8 (L) 10/25/2017   HGB 9.9 (L) 10/25/2017   HCT 31.3 (L) 10/25/2017   MCV 95.4 10/25/2017   PLT 38 (L) 10/25/2017   Recent Labs    07/16/17 1745  08/09/17 1159 10/08/17 2119  10/09/17 0417 10/24/17 2136 10/25/17 0354  NA 140   < > 140 139   < > 138 140 142  K 4.0   < > 4.2 3.5   < > 3.6 3.7 3.7  CL 107   < > 108 107   < > 109 107 115*  CO2 22   < > 22 22  --  21*  --  20*  GLUCOSE 88   < > 87 93   < > 79 103* 74  BUN 17   < > 15 12   < > 9 10 10   CREATININE 0.43*   < > 0.57* 0.31*   < > <0.30* 0.30* 0.31*  CALCIUM 8.9   < > 8.9 8.9  --  8.5*  --  8.1*  GFRNONAA >60   < > >60 >60  --  NOT CALCULATED  --  >60  GFRAA >60   < > >60 >60  --  NOT CALCULATED  --  >60  PROT 6.3*  --  6.0* 5.9*  --   --   --   --   ALBUMIN 3.6  --   3.3* 3.2*  --   --   --   --   AST 23  --  29 30  --   --   --   --   ALT 33  --  121* 31  --   --   --   --   ALKPHOS 63  --  99 62  --   --   --   --   BILITOT 1.4*  --  0.9 0.5  --   --   --   --    < > = values in this interval not displayed.    RADIOGRAPHIC STUDIES: I have personally reviewed the radiological images as listed and agreed with the findings in the report. Dg Chest 2 View  Result Date: 10/08/2017 CLINICAL DATA:  Acute onset of altered mental status. Dehydration. EXAM: CHEST - 2 VIEW COMPARISON:  Chest radiograph performed 07/16/2017 FINDINGS: The lungs are hypoexpanded. Mild bibasilar airspace opacities likely reflect atelectasis. No pleural effusion or pneumothorax is seen. The heart is normal in size. No acute osseous abnormalities are identified. IMPRESSION: Lungs hypoexpanded. Mild bibasilar airspace  opacities likely reflect atelectasis. Electronically Signed   By: Garald Balding M.D.   On: 10/08/2017 22:20   Ct Head Wo Contrast  Result Date: 10/09/2017 CLINICAL DATA:  Altered mental status. Patient is currently being treated for a glial neoplasm with radiation therapy. EXAM: CT HEAD WITHOUT CONTRAST TECHNIQUE: Contiguous axial images were obtained from the base of the skull through the vertex without intravenous contrast. COMPARISON:  CT 07/16/2017 and MRI 08/27/2017 FINDINGS: Brain: Acute intracranial hemorrhage or large vascular territory infarct. Extensive white matter changes from known glial tumor involving the left frontal and temporal lobes as well as left caudate and basal ganglia appear stable. No hydrocephalus. No extra-axial fluid. Midline fourth ventricle and basal cisterns with patency and without effacement. No cerebellar lesion is identified. Brainstem unremarkable. Vascular: No hyperdense vessel sign. Carotid calcifications are noted bilaterally. Skull: Left temporoparietal craniotomy change. No acute fracture or suspicious osseous lesions. Sinuses/Orbits: Left  lens replacement. No acute paranasal sinus disease. Other: None IMPRESSION: Extensive supratentorial white matter change, more so on the left involving the left frontal and temporal lobes as well as the left caudate and basal ganglia consistent with known glial tumor. No significant change identified. Left-sided temporoparietal craniotomy. Electronically Signed   By: Ashley Royalty M.D.   On: 10/09/2017 00:06   Ct Angio Chest Pe W/cm &/or Wo Cm  Result Date: 10/09/2017 CLINICAL DATA:  Acute onset of altered mental status. Generalized abdominal pain. Patient on radiation therapy for glial neoplasm. EXAM: CT ANGIOGRAPHY CHEST CT ABDOMEN AND PELVIS WITH CONTRAST TECHNIQUE: Multidetector CT imaging of the chest was performed using the standard protocol during bolus administration of intravenous contrast. Multiplanar CT image reconstructions and MIPs were obtained to evaluate the vascular anatomy. Multidetector CT imaging of the abdomen and pelvis was performed using the standard protocol during bolus administration of intravenous contrast. CONTRAST:  131mL ISOVUE-370 IOPAMIDOL (ISOVUE-370) INJECTION 76% COMPARISON:  CT of the abdomen and pelvis performed 09/02/2015, and CT of the chest performed 12/14/2011 FINDINGS: CTA CHEST FINDINGS Cardiovascular:  There is no evidence of pulmonary embolus. The heart is normal in size. The thoracic aorta is grossly unremarkable. The great vessels are within normal limits. Mediastinum/Nodes: The mediastinum is unremarkable in appearance. No mediastinal lymphadenopathy is seen. No pericardial effusion is identified. The left thyroid lobe is mildly heterogeneous but otherwise unremarkable. The right thyroid lobe is diminutive. No axillary lymphadenopathy is seen. A moderate hiatal hernia is noted. Lungs/Pleura: Bilateral atelectasis or scarring is noted. No pleural effusion or pneumothorax is seen. No masses are identified. Musculoskeletal: No acute osseous abnormalities are identified.  The visualized musculature is unremarkable in appearance. Review of the MIP images confirms the above findings. CT ABDOMEN and PELVIS FINDINGS Hepatobiliary: The liver is unremarkable in appearance. The gallbladder is unremarkable in appearance. The common bile duct remains normal in caliber. Pancreas: The pancreas is within normal limits. Spleen: A few small hypodensities within the spleen may reflect small cysts. Adrenals/Urinary Tract: The adrenal glands are unremarkable in appearance. A small right renal cyst is noted. There is no evidence of hydronephrosis. No renal or ureteral stones are identified. No perinephric stranding is appreciated. Stomach/Bowel: The stomach is unremarkable in appearance. The small bowel is within normal limits. The appendix is normal in caliber, without evidence of appendicitis. The colon is unremarkable in appearance. Mild wall thickening at the rectum could reflect mild chronic inflammation or proctitis. Vascular/Lymphatic: Scattered calcification is seen along the abdominal aorta and its branches. The abdominal aorta is otherwise grossly unremarkable.  The inferior vena cava is grossly unremarkable. No retroperitoneal lymphadenopathy is seen. No pelvic sidewall lymphadenopathy is identified. Reproductive: The bladder is mildly distended and grossly unremarkable. The uterus is unremarkable in appearance. The ovaries are relatively symmetric. No suspicious adnexal masses are seen. Other: No additional soft tissue abnormalities are seen. Musculoskeletal: No acute osseous abnormalities are identified. Multilevel vacuum phenomenon is noted along the lower thoracic and lumbar spine. The visualized musculature is unremarkable in appearance. Review of the MIP images confirms the above findings. IMPRESSION: 1. No evidence of pulmonary embolus. 2. No acute abnormality seen to explain the patient's symptoms. 3. Mild wall thickening at the rectum could reflect mild chronic inflammation or  possibly proctitis. 4. Moderate hiatal hernia noted. 5. Bilateral atelectasis or scarring noted. Lungs otherwise clear. 6. Small right renal cyst noted. Aortic Atherosclerosis (ICD10-I70.0). Electronically Signed   By: Garald Balding M.D.   On: 10/09/2017 00:07   Ct Abdomen Pelvis W Contrast  Result Date: 10/09/2017 CLINICAL DATA:  Acute onset of altered mental status. Generalized abdominal pain. Patient on radiation therapy for glial neoplasm. EXAM: CT ANGIOGRAPHY CHEST CT ABDOMEN AND PELVIS WITH CONTRAST TECHNIQUE: Multidetector CT imaging of the chest was performed using the standard protocol during bolus administration of intravenous contrast. Multiplanar CT image reconstructions and MIPs were obtained to evaluate the vascular anatomy. Multidetector CT imaging of the abdomen and pelvis was performed using the standard protocol during bolus administration of intravenous contrast. CONTRAST:  152mL ISOVUE-370 IOPAMIDOL (ISOVUE-370) INJECTION 76% COMPARISON:  CT of the abdomen and pelvis performed 09/02/2015, and CT of the chest performed 12/14/2011 FINDINGS: CTA CHEST FINDINGS Cardiovascular:  There is no evidence of pulmonary embolus. The heart is normal in size. The thoracic aorta is grossly unremarkable. The great vessels are within normal limits. Mediastinum/Nodes: The mediastinum is unremarkable in appearance. No mediastinal lymphadenopathy is seen. No pericardial effusion is identified. The left thyroid lobe is mildly heterogeneous but otherwise unremarkable. The right thyroid lobe is diminutive. No axillary lymphadenopathy is seen. A moderate hiatal hernia is noted. Lungs/Pleura: Bilateral atelectasis or scarring is noted. No pleural effusion or pneumothorax is seen. No masses are identified. Musculoskeletal: No acute osseous abnormalities are identified. The visualized musculature is unremarkable in appearance. Review of the MIP images confirms the above findings. CT ABDOMEN and PELVIS FINDINGS  Hepatobiliary: The liver is unremarkable in appearance. The gallbladder is unremarkable in appearance. The common bile duct remains normal in caliber. Pancreas: The pancreas is within normal limits. Spleen: A few small hypodensities within the spleen may reflect small cysts. Adrenals/Urinary Tract: The adrenal glands are unremarkable in appearance. A small right renal cyst is noted. There is no evidence of hydronephrosis. No renal or ureteral stones are identified. No perinephric stranding is appreciated. Stomach/Bowel: The stomach is unremarkable in appearance. The small bowel is within normal limits. The appendix is normal in caliber, without evidence of appendicitis. The colon is unremarkable in appearance. Mild wall thickening at the rectum could reflect mild chronic inflammation or proctitis. Vascular/Lymphatic: Scattered calcification is seen along the abdominal aorta and its branches. The abdominal aorta is otherwise grossly unremarkable. The inferior vena cava is grossly unremarkable. No retroperitoneal lymphadenopathy is seen. No pelvic sidewall lymphadenopathy is identified. Reproductive: The bladder is mildly distended and grossly unremarkable. The uterus is unremarkable in appearance. The ovaries are relatively symmetric. No suspicious adnexal masses are seen. Other: No additional soft tissue abnormalities are seen. Musculoskeletal: No acute osseous abnormalities are identified. Multilevel vacuum phenomenon is noted along the lower  thoracic and lumbar spine. The visualized musculature is unremarkable in appearance. Review of the MIP images confirms the above findings. IMPRESSION: 1. No evidence of pulmonary embolus. 2. No acute abnormality seen to explain the patient's symptoms. 3. Mild wall thickening at the rectum could reflect mild chronic inflammation or possibly proctitis. 4. Moderate hiatal hernia noted. 5. Bilateral atelectasis or scarring noted. Lungs otherwise clear. 6. Small right renal cyst  noted. Aortic Atherosclerosis (ICD10-I70.0). Electronically Signed   By: Garald Balding M.D.   On: 10/09/2017 00:07    ASSESSMENT & PLAN:  Glioblastoma  Ms. Duggin presents with failure to thrive secondary to acute on chronic encephalopathy, due to dehydration in setting of global decline with advancing glioblastoma and harmful treatment effects.  There is no further workup or treatment recommended for her underlying cancer, given her poor functional status and the relative lack of efficacy and high toxicity of remaining treatment options.  I have discussed with the family my recommendation which would be to proceed with further care under hospice.  Amongst her family, there is not full agreement at this time regarding this suggested plan of care.  The patient is unable to provide such consent.  They were agreeable to consultation with palliative care, specifically regarding the recurring need for hydration support.  I explained that we do not typically place PICC lines in cancer patients at the end of life stage.  A better long term solution would be PEG tube, but I relayed my concerns over pain and suffering such and intervention could create.  Would appreciate recommendations from palliative regarding plans for nutritional support going forward.  All questions were answered. The patient knows to call the clinic with any problems, questions or concerns.  I will be available via on-call line [phone] this weekend, will return Monday.  The total time spent in the encounter was 40 minutes and more than 50% was on counseling and review of test results     Ventura Sellers, MD 10/25/2017 2:19 PM

## 2017-10-26 DIAGNOSIS — R69 Illness, unspecified: Secondary | ICD-10-CM | POA: Diagnosis not present

## 2017-10-26 DIAGNOSIS — Z7189 Other specified counseling: Secondary | ICD-10-CM

## 2017-10-26 DIAGNOSIS — I10 Essential (primary) hypertension: Secondary | ICD-10-CM | POA: Diagnosis not present

## 2017-10-26 DIAGNOSIS — R531 Weakness: Secondary | ICD-10-CM | POA: Diagnosis not present

## 2017-10-26 DIAGNOSIS — E860 Dehydration: Secondary | ICD-10-CM | POA: Diagnosis not present

## 2017-10-26 DIAGNOSIS — R1310 Dysphagia, unspecified: Secondary | ICD-10-CM | POA: Diagnosis not present

## 2017-10-26 DIAGNOSIS — F419 Anxiety disorder, unspecified: Secondary | ICD-10-CM | POA: Diagnosis not present

## 2017-10-26 DIAGNOSIS — D696 Thrombocytopenia, unspecified: Secondary | ICD-10-CM | POA: Diagnosis not present

## 2017-10-26 DIAGNOSIS — E118 Type 2 diabetes mellitus with unspecified complications: Secondary | ICD-10-CM | POA: Diagnosis not present

## 2017-10-26 DIAGNOSIS — C719 Malignant neoplasm of brain, unspecified: Secondary | ICD-10-CM | POA: Diagnosis not present

## 2017-10-26 LAB — BASIC METABOLIC PANEL
ANION GAP: 5 (ref 5–15)
BUN: 5 mg/dL — ABNORMAL LOW (ref 8–23)
CALCIUM: 8.2 mg/dL — AB (ref 8.9–10.3)
CO2: 23 mmol/L (ref 22–32)
Chloride: 115 mmol/L — ABNORMAL HIGH (ref 98–111)
Creatinine, Ser: 0.35 mg/dL — ABNORMAL LOW (ref 0.44–1.00)
GFR calc Af Amer: >60 mL/min (ref >60)
GFR calc non Af Amer: >60 mL/min (ref >60)
GLUCOSE: 97 mg/dL (ref 70–99)
Potassium: 3.2 mmol/L — ABNORMAL LOW (ref 3.5–5.1)
Sodium: 143 mmol/L (ref 135–145)

## 2017-10-26 LAB — CBC
HCT: 29.5 % — ABNORMAL LOW (ref 36.0–46.0)
Hemoglobin: 9.3 g/dL — ABNORMAL LOW (ref 12.0–15.0)
MCH: 30.3 pg (ref 26.0–34.0)
MCHC: 31.5 g/dL (ref 30.0–36.0)
MCV: 96.1 fL (ref 78.0–100.0)
PLATELETS: 29 10*3/uL — AB (ref 150–400)
RBC: 3.07 MIL/uL — ABNORMAL LOW (ref 3.87–5.11)
RDW: 20.3 % — AB (ref 11.5–15.5)
WBC: 2.9 10*3/uL — AB (ref 4.0–10.5)

## 2017-10-26 LAB — GLUCOSE, CAPILLARY
GLUCOSE-CAPILLARY: 103 mg/dL — AB (ref 70–99)
GLUCOSE-CAPILLARY: 113 mg/dL — AB (ref 70–99)
GLUCOSE-CAPILLARY: 113 mg/dL — AB (ref 70–99)
GLUCOSE-CAPILLARY: 88 mg/dL (ref 70–99)
Glucose-Capillary: 92 mg/dL (ref 70–99)
Glucose-Capillary: 112 mg/dL — ABNORMAL HIGH (ref 70–99)

## 2017-10-26 MED ORDER — POTASSIUM CHLORIDE 20 MEQ/15ML (10%) PO SOLN
40.0000 meq | Freq: Every day | ORAL | Status: DC
  Filled 2017-10-26: qty 30

## 2017-10-26 MED ORDER — LIDOCAINE VISCOUS HCL 2 % MT SOLN
15.0000 mL | OROMUCOSAL | Status: DC | PRN
  Administered 2017-10-26 – 2017-10-30 (×6): 15 mL via OROMUCOSAL
  Filled 2017-10-26 (×8): qty 15

## 2017-10-26 MED ORDER — POTASSIUM CHLORIDE 10 MEQ/100ML IV SOLN
10.0000 meq | INTRAVENOUS | Status: AC
  Administered 2017-10-26 (×4): 10 meq via INTRAVENOUS
  Filled 2017-10-26 (×3): qty 100

## 2017-10-26 NOTE — Progress Notes (Signed)
CRITICAL VALUE ALERT  Critical Value:  Platelets 29  Date & Time Notied: 10/26/17 0509   Provider Notified: X. Blount   Orders Received/Actions taken: Awaiting respond

## 2017-10-26 NOTE — Progress Notes (Addendum)
PROGRESS NOTE    Kelly Maxwell  ION:[id number] DOB: Dec 22, 1945 DOA: 10/24/2017 PCP: Wenda Low, MD   Brief Narrative:  HPI on 10/24/2017 by Dr. Mitzi Hansen Kelly Maxwell is a 72 y.o. 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  Per discussion with family at the bedside, she has been refusing to eat or drink for the past 2 days, was seen by home health today, suspected to have thrush, and was started on nystatin which she did take a dose of at home.  She has had recent admissions with AMS that was felt to be secondary, at least in part, to dehydration.  She is followed by palliative care, remains full code, but family has indicated that they are in favor of transition to hospice if she is unable to continue on chemotherapy.  Patient does not express any specific complaints, but family reports that she seems to be in pain when she tries to drink or eat recently. SNF was recommended at time of recent hospital discharge but family elected to take her home. She has had RLE swelling that was evaluated with negative venous US (she also had CTA chest negative for PE) during the admission earlier this month.  Interim history Patient admitted for dehydration and altered mental status.  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  Palliative care consulted and appreciated.  Assessment & Plan   Dehydration with acute encephalopathy -Patient presents with poor oral intake for the last several days -Possibly secondary to thrush and mouth  sores -Continue IV fluids, nystatin swish and spit, mouthwash -viscous lidocaine PRN added   Gliosarcoma -Status post radiation in 2013, currently with progression on chemotherapy -Followed by neuro-oncology Dr. Mickeal Skinner -Discussed with Dr. Mickeal Skinner, will see the patient in consult -Continue Decadron, Vimpat, Adderall for associated fatigue  Thrombocytopenia -Suspect secondary to chemotherapy -Currently no active bleeding -Platelets currently 29 -Continue to monitor CBC  Essential hypertension -Continue amlodipine  Diabetes mellitus, type II -Metformin held -Continue insulin sliding scale with CBG monitoring  Chronic Normocytic anemia -hemoglobin currently 9.3 baseline approximately 10-11 -Currently on IV fluids, suspect some dilutional component -Monitor CBC  Hypokalemia -Will replace and continue to monitor  Goals of care -Discussed with Dr. Mickeal Skinner, feels that patient has poor quality of life and functional status, no further recommendations as far as treatment. Feels patient should proceed with hospice however daughter declines but is open to palliative care consult. -Palliative care consulted and appreciated -Dr. Rowe Pavy and I had a long conversation with the patient's daughter, husband. Expressed that PICC line or PEG tube carry risk of infection. Daughter and husband feel that patient gets dehydrated and just need fluids and her mental status improves.  Daughter states that patient has will to live and this was discussed with the patient yesterday evening. -IV team consulted at the request of family to discuss risks/benefits of PICC  DVT Prophylaxis  SCDs  Code Status: Full  Family Communication: Family at bedside  Disposition Plan: Observation. Disposition pending, PT recommended SNF- however, family wishes patient to return home.  Consultants Neuro-Oncology Palliative care Palliative Care  Procedures  None  Antibiotics   Anti-infectives (From admission, onward)    None      Subjective:   Kelly Maxwell seen and examined today.  Currently only alert to self.   Objective:   Vitals:   10/25/17 0500 10/25/17 1435 10/25/17 2135 10/26/17 0535  BP:  118/74 130/80 (!) 148/79  Pulse:  71 69 65  Resp:   16 14  Temp:  98.9 F (37.2 C) 99 F (37.2 C) 98 F (36.7 C)  TempSrc:  Oral Oral Oral  SpO2:  99% 97% 97%  Weight: 64.2 kg   67.5 kg  Height:        Intake/Output Summary (Last 24 hours) at 10/26/2017 1226 Last data filed at 10/26/2017 1217 Gross per 24 hour  Intake 451.21 ml  Output 1900 ml  Net -1448.79 ml   Filed Weights   10/25/17 0023 10/25/17 0500 10/26/17 0535  Weight: 64.2 kg 64.2 kg 67.5 kg   Exam  General: Well developed, chronically ill-appearing, no apparent distress  HEENT: NCAT, mucous membranes dry  Neck: Supple  Cardiovascular: S1 S2 auscultated, no rubs, murmurs or gallops. Regular rate and rhythm.  Respiratory: Clear to auscultation bilaterally with equal chest rise  Abdomen: Soft, nontender, nondistended, + bowel sounds  Extremities: warm dry without cyanosis clubbing or edema  Neuro: AAOx1 (self only), does not follow commands  Data Reviewed: I have personally reviewed following labs and imaging studies  CBC: Recent Labs  Lab 10/24/17 2116 10/24/17 2136 10/25/17 0354 10/26/17 0401  WBC 4.9  --  3.8* 2.9*  NEUTROABS 4.4  --  3.1  --   HGB 11.1* 10.5* 9.9* 9.3*  HCT 35.3* 31.0* 31.3* 29.5*  MCV 95.4  --  95.4 96.1  PLT 51*  --  38* 29*   Basic Metabolic Panel: Recent Labs  Lab 10/24/17 2136 10/25/17 0354 10/26/17 0401  NA 140 142 143  K 3.7 3.7 3.2*  CL 107 115* 115*  CO2  --  20* 23  GLUCOSE 103* 74 97  BUN 10 10 5*  CREATININE 0.30* 0.31* 0.35*  CALCIUM  --  8.1* 8.2*   GFR: Estimated Creatinine Clearance: 59.1 mL/min (A) (by C-G formula based on SCr of 0.35 mg/dL (L)). Liver Function Tests: No results for input(s): AST, ALT, ALKPHOS, BILITOT, PROT, ALBUMIN in the last 168  hours. No results for input(s): LIPASE, AMYLASE in the last 168 hours. No results for input(s): AMMONIA in the last 168 hours. Coagulation Profile: No results for input(s): INR, PROTIME in the last 168 hours. Cardiac Enzymes: No results for input(s): CKTOTAL, CKMB, CKMBINDEX, TROPONINI in the last 168 hours. BNP (last 3 results) No results for input(s): PROBNP in the last 8760 hours. HbA1C: No results for input(s): HGBA1C in the last 72 hours. CBG: Recent Labs  Lab 10/25/17 2021 10/25/17 2354 10/26/17 0339 10/26/17 0736 10/26/17 1140  GLUCAP 112* 119* 92 88 103*   Lipid Profile: No results for input(s): CHOL, HDL, LDLCALC, TRIG, CHOLHDL, LDLDIRECT in the last 72 hours. Thyroid Function Tests: No results for input(s): TSH, T4TOTAL, FREET4, T3FREE, THYROIDAB in the last 72 hours. Anemia Panel: No results for input(s): VITAMINB12, FOLATE, FERRITIN, TIBC, IRON, RETICCTPCT in the last 72 hours. Urine analysis:    Component Value Date/Time   COLORURINE YELLOW 10/08/2017 1931   APPEARANCEUR CLEAR 10/08/2017 1931   LABSPEC 1.016 10/08/2017 1931   PHURINE 6.0 10/08/2017 1931   GLUCOSEU NEGATIVE 10/08/2017 1931   HGBUR NEGATIVE 10/08/2017 1931   BILIRUBINUR  NEGATIVE 10/08/2017 1931   KETONESUR 5 (A) 10/08/2017 1931   PROTEINUR NEGATIVE 10/08/2017 1931   NITRITE NEGATIVE 10/08/2017 1931   LEUKOCYTESUR NEGATIVE 10/08/2017 1931   Sepsis Labs: @LABRCNTIP (procalcitonin:4,lacticidven:4)  )No results found for this or any previous visit (from the past 240 hour(s)).    Radiology Studies: No results found.   Scheduled Meds: . amLODipine  5 mg Oral Daily  . amphetamine-dextroamphetamine  30 mg Oral Daily  . atorvastatin  40 mg Oral q1800  . bisacodyl  5 mg Oral Daily  . cholecalciferol  1,000 Units Oral Daily  . dexamethasone  2 mg Oral Daily  . Difluprednate  1 drop Left Eye BID  . feeding supplement (ENSURE ENLIVE)  237 mL Oral BID BM  . insulin aspart  0-9 Units  Subcutaneous Q4H  . ketorolac  1 drop Left Eye Daily  . lacosamide  50 mg Oral BID  . mouth rinse  15 mL Mouth Rinse BID  . Melatonin  10-20 mg Oral QHS  . nystatin  5 mL Oral QID  . pantoprazole  40 mg Oral Daily  . potassium chloride  40 mEq Oral Daily   Continuous Infusions: . dextrose 5 % and 0.45% NaCl 75 mL/hr at 10/26/17 0651     LOS: 0 days   Time Spent in minutes   45 minutes (spent with patient and family at bedside explaining work-up, discussing goals of care etc.)  Cristal Ford D.O. on 10/26/2017 at 12:26 PM  Between 7am to 7pm - Please see pager noted on [email or web address]  After 7pm go to [email or web address]  And look for the night coverage person covering for me after hours  [facility] Office  [phone]

## 2017-10-26 NOTE — Progress Notes (Signed)
Requested by MD to explain risks/benefits of PICC line to patients husband and daughter. Explained benefits/risks and home care of PICC to family. Family voiced no further questions at this time.

## 2017-10-26 NOTE — Consult Note (Signed)
Consultation Note Date: 10/26/2017   Patient Name: Kelly Maxwell  DOB: [birth year]  MRN: [id number]  Age / Sex: 72 y.o., female  PCP: Wenda Low, MD Referring Physician: Cristal Ford, DO  Reason for Consultation: Establishing goals of care  HPI/Patient Profile: 72 y.o. female   admitted on 10/24/2017    Clinical Assessment and Goals of Care:  72 year old lady who lives at home with her husband and daughter, history of left temporal lobe glioma sarcoma, past medical history also significant for hypertension diabetes. Patient has been seen by home-based palliative care through hospice and palliative care center of [facility].  Patient has been admitted with dehydration, acute encephalopathy, thrombocytopenia, oral thrush. For her by mouth sarcoma, patient is status post radiation, was recently on chemotherapy. She has been followed by neuro oncologist. Final recommendations from them are to pursue a mode of care that focuses on comfort, hospice.  Hospital course complicated by oral thrush, patient not having optimal oral intake, family's concerns for patient becoming dehydrated. Palliative medicine consultation for goals of care discussions, CODE STATUS discussions, discussions pertaining to artificial nutrition and hydration through use of PICC line/PEG tubes.  Patient is a weak appearing lady resting in bed. She appears to have edema. She is awake but does not verbalize much. She tracks her family in the room. She appears chronically ill. She does not appear to be in distress.  Patient's daughter is present at the bedside. Subsequently, patient's husband also arrived. I introduced myself and palliative care as follows:  Palliative medicine is specialized medical care for people living with serious illness. It focuses on providing relief from the symptoms and stress of a serious illness. The goal is to  improve quality of life for both the patient and the family.  Patient's daughter is aware of supportive/palliative services. Along with [facility] M.D. Dr. Ree Kida, and also later on, with the patient and her family, we engaged in conversations regarding the patient's overall condition, their goals, wishes and values.   Brief life review performed. Patient is someone who loves flowers. Patient's husband states that she always spent a long time in the yard weeding and gardening, she loves hydrangeas.   Patient's daughter states that the patient has not lost the will to live. She states that she has discussed this directly with the patient recently. We reviewed the serious incurable nature of the patient's illness. Daughter is requesting a repeat imaging to see if it will demonstrate lack of response to chemotherapy.  We then talked about the patient's oral thrush. Will add Viscous Lidocaine.  We then talked about artificial nutrition and hydration particularly PEG tubes and PICC lines. We discussed frankly and compassionately that the procedure to undergo these artificial tubes and lines itself is painful. There is a risk of anesthesia with PEG tube placement. We discussed that in addition to the actual procedure being uncomfortable, there are side effects and complications to artificial nutrition and hydration.  We discussed frankly that having artificial source of nutrition or hydration is not the  same as the patient eating. Daughter states that the patient enjoyed eating a grilled cheese sandwich recently. Reiterated that this would be the ideal way to go, encouraging slow small by mouth meals.  Family is very concerned about dehydration. Daughter is asking about PICC line. Discussed about risk of localized infection, cellulitis, thrombophlebitis - clotting in the vein. With regards to PEG tube discussed about abdominal wall infections or peritonitis if pulled out.  Gently discussed with daughter that  there are other ways to nurture the patient. Providing good oral care, gently sustaining patient with sips of liquids or small soft meals multiple times a day rather than artificial tubes and lines.  Expressed concerns about fluid overload or aspiration with artificial nutrition and hydration.  At this time, patient's family's goals are not palliative or comfort focused in nature. We will simply request IV team to come by and discuss with the patient's daughter about risks and benefits of PICC line placement and how to care for her PICC line.  I did try to bring up that in the face of serious incurable terminal illness, the body responds with dehydration. It is simply an end-of-life sign. It is the body's way of preparing. Lesser respiratory secretions less urine output less fluid in the GI tract helps the body avoid issues such as gurgling, incontinence, nausea vomiting or bloating. At this point in time, family doesn't wish to consider a more comfort focused approach. Provided supportive care and active listening. Validated daughter's concerns for the patient's life prolongation with any and all means possible. See additional discussions/recommendations below. Thank you for the consult.   HCPOA  husband and daughter.   SUMMARY OF RECOMMENDATIONS    1. Full code/Full scope.  2. Viscous Lidocaine, oral care for Thrush.  3. Continue current pain regimen.  4. Vascular team has been consulted, patient's daughter would like to discuss directly with them about how to care for a PICC line. Extensive discussions undertaken about artifical nutrition and hydration in the setting of end stage malignancy. See above.  5. Family is asking for repeat brain imaging, discussed with [facility] MD Dr. Ree Kida.   Would recommend continuation of outpatient palliative care, patient is being seen by palliative NP for home based palliative care. Recommend continued follow up, to build trust and relationship with patient and  family, to facilitate ongoing conversations regarding hospice philosophy of care, appropriateness of DNR DNI etc.    Code Status/Advance Care Planning:  Full code    Symptom Management:    as above   Palliative Prophylaxis:   Oral Care  Additional Recommendations (Limitations, Scope, Preferences):  Full Scope Treatment  Psycho-social/Spiritual:   Desire for further Chaplaincy support:yes   Additional Recommendations: Education on Hospice  Prognosis:   < 6 months  Discharge Planning: To Be Determined      Primary Diagnoses: Present on Admission: . 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 HTN (hypertension) . Anxiety and depression . Dehydration . Thrombocytopenia ([place])   I have reviewed the medical record, interviewed the patient and family, and examined the patient. The following aspects are pertinent.  Past Medical History:  Diagnosis Date  . Cancer ([place])   . Depression   . GERD (gastroesophageal reflux disease)   . 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 Headache(784.0)   . Hiatal hernia   . History of radiation therapy  01/07/2012-02/18/12   left temporal  58GY  . HLD (hyperlipidemia)   . PONV (postoperative nausea and vomiting)   . 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 Marital status: Married    Spouse name: Not on file  . Number of children: 3  . Years of education: Not on file  . Highest education level: Not on file  Occupational History  . Not on file  Social Needs  . Financial resource strain: Not on file  . Food insecurity:    Worry: Not on file    Inability: Not on file  . Transportation needs:    Medical: Not on file    Non-medical: Not on file  Tobacco Use  . Smoking status: Never Smoker  .  Smokeless tobacco: Never Used  Substance and Sexual Activity  . Alcohol use: No  . Drug use: No  . Sexual activity: Yes  Lifestyle  . Physical activity:    Days per week: Not on file    Minutes per session: Not on file  . Stress: Not on file  Relationships  . Social connections:    Talks on phone: Not on file    Gets together: Not on file    Attends religious service: Not on file    Active member of club or organization: Not on file    Attends meetings of clubs or organizations: Not on file    Relationship status: Not on file  Other Topics Concern  . Not on file  Social History Narrative   2nd marriage of 6 years, previously widowed   Family History  Problem Relation Age of Onset  . Heart disease Mother    Scheduled Meds: . amLODipine  5 mg Oral Daily  . amphetamine-dextroamphetamine  30 mg Oral Daily  . atorvastatin  40 mg Oral q1800  . bisacodyl  5 mg Oral Daily  . cholecalciferol  1,000 Units Oral Daily  . dexamethasone  2 mg Oral Daily  . Difluprednate  1 drop Left Eye BID  . feeding supplement (ENSURE ENLIVE)  237 mL Oral BID BM  . insulin aspart  0-9 Units Subcutaneous Q4H  . ketorolac  1 drop Left Eye Daily  . lacosamide  50 mg Oral BID  . mouth rinse  15 mL Mouth Rinse BID  . Melatonin  10-20 mg Oral QHS  . nystatin  5 mL Oral QID  . pantoprazole  40 mg Oral Daily  . potassium chloride  40 mEq Oral Daily   Continuous Infusions: . dextrose 5 % and 0.45% NaCl 75 mL/hr at 10/26/17 0651   PRN Meds:.acetaminophen **OR** acetaminophen, fentaNYL (SUBLIMAZE) injection, lidocaine, magic mouthwash w/lidocaine, ondansetron **OR** ondansetron (ZOFRAN) IV, senna-docusate Medications Prior to Admission:  Prior to Admission medications   Medication Sig Start Date End Date Taking? Authorizing Provider  amLODipine (NORVASC) 5 MG tablet Take 1 tablet (5 mg total) by mouth daily. 08/26/17  Yes Vaslow, Acey Lav, MD  amphetamine-dextroamphetamine (ADDERALL) 30 MG tablet Take 1  tablet by mouth daily. 09/23/17  Yes Vaslow, Acey Lav, MD  atorvastatin (LIPITOR) 40 MG tablet Take 40 mg by mouth daily.    Yes [provider]  bisacodyl (DULCOLAX) 5 MG EC tablet Take 5 mg by mouth daily.   Yes [provider]  Steva Colder Extract POWD Take 1 packet by mouth daily.    Yes [provider]  Bromfenac  Sodium (PROLENSA) 0.07 % SOLN Place 1 drop into the left eye daily. Reported on 09/02/2015    Yes [provider]  cholecalciferol (VITAMIN D) 1000 units tablet Take 1 tablet (1,000 Units total) by mouth daily. 06/20/17  Yes Vaslow, Acey Lav, MD  dexamethasone (DECADRON) 2 MG tablet Take 1 tablet (2 mg total) by mouth daily. 08/09/17  Yes Vaslow, Acey Lav, MD  Diaper Rash Products ([employer] DIAPER) 1-10 % CREA APPLY EXTERNALLY AS DIRECTED AS NEEDED WITH EACH [facility] CHANGE 09/06/17  Yes [provider]  Difluprednate (DUREZOL) 0.05 % EMUL Place 1 drop into the left eye 2 (two) times daily.    Yes [provider]  lacosamide (VIMPAT) 50 MG TABS tablet Take 1 tablet (50 mg total) by mouth 2 (two) times daily. 06/24/17  Yes Vaslow, Acey Lav, MD  Melatonin 5 MG CAPS Take 10-20 mg by mouth at bedtime.    Yes [provider]  metFORMIN (GLUCOPHAGE) 500 MG tablet Take 500 mg by mouth 2 (two) times daily with a meal.    Yes [provider]  NON FORMULARY Take 1 capsule by mouth 2 (two) times daily. Lazarus Natural CBD Oil    Yes [provider]  nystatin (MYCOSTATIN) 100000 UNIT/ML suspension Take 5 mLs by mouth 4 (four) times daily. Swish and spit out 10/24/17  Yes [provider]  nystatin cream (MYCOSTATIN) Apply 1 application topically as needed for dry skin.  08/21/17  Yes [provider]  ondansetron (ZOFRAN) 8 MG tablet Take 1 tablets (8mg ) by mouth once 30-60 prior to lomustine and Q 8h prn N/V 09/25/17  Yes Vaslow, Acey Lav, MD  pantoprazole (PROTONIX) 40 MG tablet Take 1 tablet (40 mg total)  by mouth daily. 08/09/17  Yes Vaslow, Acey Lav, MD  senna-docusate (SENOKOT-S) 8.6-50 MG tablet Take 1 tablet by mouth at bedtime as needed for mild constipation. 07/19/17  Yes Eugenie Filler, MD  clonazePAM (KLONOPIN) 0.5 MG tablet Take 1 tablet (0.5 mg total) by mouth at bedtime as needed for anxiety. Patient not taking: Reported on 10/24/2017 07/19/17   Eugenie Filler, MD  GLEOSTINE 10 MG capsule Take 10 mg by mouth daily. 09/26/17   [provider]  GLEOSTINE 100 MG capsule Take 100 mg by mouth as directed.  09/26/17   [provider]  GLEOSTINE 40 MG capsule Take 40 mg by mouth daily. 09/26/17   [provider]   Allergies  Allergen Reactions  . Ciprofloxacin     AMS  . Codeine Nausea And Vomiting and Nausea Only  . 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    . Compazine [Prochlorperazine Edisylate] Other (See Comments) and Rash    Became hyper Became hyper   Review of Systems +weakness  Physical Exam Weak chronically ill appearing lady Shallow regular resp Has edema Is awake alert Has thrush  Vital Signs: BP (!) 148/79 (BP Location: Right Arm)   Pulse 65   Temp 98 F (36.7 C) (Oral)   Resp 14   Ht 5\' 4"  (1.626 m)   Wt 67.5 kg   SpO2 97%   BMI 25.54 kg/m  Pain Scale: 0-10 POSS *See Group Information*: 1-Acceptable,Awake and alert Pain Score: Asleep   SpO2: SpO2: 97 % O2 Device:SpO2: 97 % O2 Flow Rate: .   IO: Intake/output summary:   Intake/Output Summary (Last 24 hours) at 10/26/2017 1020 Last data filed at 10/26/2017 0651 Gross per 24 hour  Intake 451.21 ml  Output 1100 ml  Net -648.79 ml    LBM: Last BM Date: 10/24/17 Baseline Weight: Weight: 65 kg Most recent weight: Weight: 67.5 kg     Palliative Assessment/Data:   PPS 30%  Time In: 9  Time Out: 10.10  Time Total:  70 min  Greater than 50%  of this time was spent counseling and coordinating  care related to the above assessment and plan.  Signed by: Loistine Chance, MD  [phone]  Please contact Palliative Medicine Team phone at [phone] for questions and concerns.  For individual provider: See Shea Evans

## 2017-10-27 DIAGNOSIS — F419 Anxiety disorder, unspecified: Secondary | ICD-10-CM | POA: Diagnosis not present

## 2017-10-27 DIAGNOSIS — E860 Dehydration: Secondary | ICD-10-CM | POA: Diagnosis not present

## 2017-10-27 DIAGNOSIS — Z7189 Other specified counseling: Secondary | ICD-10-CM | POA: Diagnosis not present

## 2017-10-27 DIAGNOSIS — E118 Type 2 diabetes mellitus with unspecified complications: Secondary | ICD-10-CM | POA: Diagnosis not present

## 2017-10-27 DIAGNOSIS — R1310 Dysphagia, unspecified: Secondary | ICD-10-CM | POA: Diagnosis not present

## 2017-10-27 DIAGNOSIS — R531 Weakness: Secondary | ICD-10-CM | POA: Diagnosis not present

## 2017-10-27 DIAGNOSIS — I10 Essential (primary) hypertension: Secondary | ICD-10-CM | POA: Diagnosis not present

## 2017-10-27 DIAGNOSIS — D696 Thrombocytopenia, unspecified: Secondary | ICD-10-CM | POA: Diagnosis not present

## 2017-10-27 DIAGNOSIS — R69 Illness, unspecified: Secondary | ICD-10-CM | POA: Diagnosis not present

## 2017-10-27 DIAGNOSIS — C719 Malignant neoplasm of brain, unspecified: Secondary | ICD-10-CM | POA: Diagnosis not present

## 2017-10-27 LAB — CBC
HEMATOCRIT: 30.4 % — AB (ref 36.0–46.0)
Hemoglobin: 9.8 g/dL — ABNORMAL LOW (ref 12.0–15.0)
MCH: 30.6 pg (ref 26.0–34.0)
MCHC: 32.2 g/dL (ref 30.0–36.0)
MCV: 95 fL (ref 78.0–100.0)
Platelets: 30 10*3/uL — ABNORMAL LOW (ref 150–400)
RBC: 3.2 MIL/uL — AB (ref 3.87–5.11)
RDW: 19.9 % — ABNORMAL HIGH (ref 11.5–15.5)
WBC: 3.2 10*3/uL — ABNORMAL LOW (ref 4.0–10.5)

## 2017-10-27 LAB — BASIC METABOLIC PANEL
ANION GAP: 5 (ref 5–15)
BUN: 5 mg/dL — AB (ref 8–23)
CO2: 21 mmol/L — AB (ref 22–32)
Calcium: 8.3 mg/dL — ABNORMAL LOW (ref 8.9–10.3)
Chloride: 115 mmol/L — ABNORMAL HIGH (ref 98–111)
Creatinine, Ser: <0.3 mg/dL — ABNORMAL LOW (ref 0.44–1.00)
GLUCOSE: 97 mg/dL (ref 70–99)
Potassium: 3.5 mmol/L (ref 3.5–5.1)
Sodium: 141 mmol/L (ref 135–145)

## 2017-10-27 MED ORDER — INSULIN ASPART 100 UNIT/ML ~~LOC~~ SOLN: 0.0000 [IU] | Freq: Three times a day (TID) | SUBCUTANEOUS | Status: DC

## 2017-10-27 NOTE — Clinical Social Work Note (Signed)
Clinical Social Work Assessment  Patient Details  Name: Kelly Maxwell MRN: [id number] Date of Birth: [birth year]  Date of referral:  10/27/17               Reason for consult:  Facility Placement                Permission sought to share information with:  Facility Art therapist granted to share information::  Yes, Verbal Permission Granted  Name::     Kelly Maxwell  Agency::  SNF  Relationship::  Daughter  Contact Information:  [phone]  Housing/Transportation Living arrangements for the past 2 months:  Single Family Home Source of Information:  Adult Children Patient Interpreter Needed:  None Criminal Activity/Legal Involvement Pertinent to Current Situation/Hospitalization:  No - Comment as needed Significant Relationships:  Adult Children, Spouse Lives with:  Adult Children, Spouse Do you feel safe going back to the place where you live?  Yes Need for family participation in patient care:  Yes (Comment)  Care giving concerns:  Patient has supportive family who provide her with necessary care. She also has a 2 hr/day caregiver who helps with personal care. Patient lives with daughter, Kelly Maxwell, and spouse.    Social Worker assessment / plan:  CSW spoke with daughter, Kelly Maxwell, about SNF placement at request of RN. Daughter is unsure about SNF placement due to patient's uncertain prognosis. Daughter reports that family has been told varying time frames of life expectancy from doctors.   Prior to admission, patient lived at home with daughter, Kelly Maxwell, and spouse. She uses a walker at baseline and was functioning well. Family has caregiver who comes in 2 hrs/day to help patient with care. Daughter reports patient is easily dehydrated due to thrush and has bouts of delirium when this happens. Patient is not currently able to be assessed due to cognition.  Daughter spoke about potential PICC line placement and how that would impact SNF placement. Patient's family would like  option of SNF and asked for CSW to make referrals to facilities.   Patient was previously at [facility] for rehab and family do not wish for her to return there.  CSW will fax out referrals and offer bed options when available.   Employment status:  Retired Nurse, adult PT Recommendations:  [place] / Referral to community resources:  [place]  Patient/Family's Response to care:  Patient's family is open to SNF at d/c but expresses confusion about varying updates from doctors. Daughter wishes to speak with oncologist to clarify current treatment plan.  Patient/Family's Understanding of and Emotional Response to Diagnosis, Current Treatment, and Prognosis:  Patient's family is having challenging time during this admission due to being told patient potentially has short amount of time to live but also being recommended SNF for rehab. Patient's daughter discussed emotional difficulties surrounding patient's diagnosis. Daughter's friend and step-dad was there to offer support.    Emotional Assessment Appearance:    Attitude/Demeanor/Rapport:  Unable to Assess Affect (typically observed):  Unable to Assess Orientation:    Alcohol / Substance use:  Not Applicable Psych involvement (Current and /or in the community):  No (Comment)  Discharge Needs  Concerns to be addressed:  Care Coordination Readmission within the last 30 days:  Yes Current discharge risk:  Physical Impairment, Terminally ill Barriers to Discharge:  Ship broker, Continued Medical Work up   [employer], [place] 10/27/2017, 5:28 PM

## 2017-10-27 NOTE — Progress Notes (Signed)
PROGRESS NOTE    Kelly Maxwell  DVV:[id number] DOB: 29-Nov-1945 DOA: 10/24/2017 PCP: Wenda Low, MD   Brief Narrative:  HPI on 10/24/2017 by Dr. Mitzi Hansen Kelly Maxwell is a 72 y.o. 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  Per discussion with family at the bedside, she has been refusing to eat or drink for the past 2 days, was seen by home health today, suspected to have thrush, and was started on nystatin which she did take a dose of at home.  She has had recent admissions with AMS that was felt to be secondary, at least in part, to dehydration.  She is followed by palliative care, remains full code, but family has indicated that they are in favor of transition to hospice if she is unable to continue on chemotherapy.  Patient does not express any specific complaints, but family reports that she seems to be in pain when she tries to drink or eat recently. SNF was recommended at time of recent hospital discharge but family elected to take her home. She has had RLE swelling that was evaluated with negative venous US (she also had CTA chest negative for PE) during the admission earlier this month.  Interim history Patient admitted for dehydration and altered mental status.  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  Palliative care consulted and appreciated.  Assessment & Plan   Dehydration with acute encephalopathy -Patient presents with poor oral intake for the last several days -Possibly secondary to thrush and mouth  sores -Continue IV fluids, nystatin swish and spit, mouthwash, viscous lidocaine PRN -Currently AAOx1 (self)  Gliosarcoma -Status post radiation in 2013, currently with progression on chemotherapy -Followed by neuro-oncology Dr. Mickeal Skinner -Discussed with Dr. Mickeal Skinner, will see the patient in consult -Continue Decadron, Vimpat, Adderall for associated fatigue  Thrombocytopenia -Suspect secondary to chemotherapy -Currently no active bleeding -Platelets currently 30 -Continue to monitor CBC  Essential hypertension -Continue amlodipine  Diabetes mellitus, type II -Metformin held -Continue insulin sliding scale with CBG monitoring  Chronic Normocytic anemia -hemoglobin currently 9.8 baseline approximately 10-11 -Currently on IV fluids, suspect some dilutional component -Monitor CBC  Hypokalemia -Will replace and continue to monitor  Goals of care -Discussed with Dr. Mickeal Skinner, feels that patient has poor quality of life and functional status, no further recommendations as far as treatment. Feels patient should proceed with hospice however daughter declines but is open to palliative care consult. -Palliative care consulted and appreciated -Dr. Rowe Pavy and I had a long conversation with the patient's daughter, husband. Expressed that PICC line or PEG tube carry risk of infection. Daughter and husband feel that patient gets dehydrated and just need fluids and her mental status improves.  Daughter states that patient has will to live and this was discussed with the patient yesterday evening. -IV team consulted at the request of family to discuss risks/benefits of PICC  DVT Prophylaxis  SCDs  Code Status: Full  Family Communication: None at bedside  Disposition Plan: Observation. Disposition pending, PT recommended SNF- however, family wishes patient to return home.  Consultants Neuro-Oncology Palliative care Palliative Care  Procedures  None  Antibiotics   Anti-infectives (From  admission, onward)   None      Subjective:   Kelly Maxwell seen and examined today.  Currently only alert to self.   Objective:   Vitals:   10/26/17 0535 10/26/17 1246 10/26/17 2140 10/27/17 0530  BP: (!) 148/79 138/82 129/89 (!) 145/81  Pulse: 65 82 80 65  Resp: 14 16 17 17   Temp: 98 F (36.7 C) 98.2 F (36.8 C) 98.4 F (36.9 C) 97.6 F (36.4 C)  TempSrc: Oral Oral Oral Oral  SpO2: 97% 94% 96% 97%  Weight: 67.5 kg   65.8 kg  Height:        Intake/Output Summary (Last 24 hours) at 10/27/2017 1248 Last data filed at 10/27/2017 0546 Gross per 24 hour  Intake 655.74 ml  Output 900 ml  Net -244.26 ml   Filed Weights   10/25/17 0500 10/26/17 0535 10/27/17 0530  Weight: 64.2 kg 67.5 kg 65.8 kg   Exam  General: Well developed, chronically ill-appearing, no apparent distress  HEENT: NCAT, mucous membranes dry  Neck: Supple  Cardiovascular: S1 S2 auscultated, RRR, no murmurs  Respiratory: Clear to auscultation bilaterally with equal chest rise  Abdomen: Soft, nontender, nondistended, + bowel sounds  Extremities: warm dry without cyanosis clubbing or edema  Neuro: AAOx1 (self for only), does not follow commands  Psych: Pleasant  Data Reviewed: I have personally reviewed following labs and imaging studies  CBC: Recent Labs  Lab 10/24/17 2116 10/24/17 2136 10/25/17 0354 10/26/17 0401 10/27/17 0509  WBC 4.9  --  3.8* 2.9* 3.2*  NEUTROABS 4.4  --  3.1  --   --   HGB 11.1* 10.5* 9.9* 9.3* 9.8*  HCT 35.3* 31.0* 31.3* 29.5* 30.4*  MCV 95.4  --  95.4 96.1 95.0  PLT 51*  --  38* 29* 30*   Basic Metabolic Panel: Recent Labs  Lab 10/24/17 2136 10/25/17 0354 10/26/17 0401 10/27/17 0509  NA 140 142 143 141  K 3.7 3.7 3.2* 3.5  CL 107 115* 115* 115*  CO2  --  20* 23 21*  GLUCOSE 103* 74 97 97  BUN 10 10 5* 5*  CREATININE 0.30* 0.31* 0.35* <0.30*  CALCIUM  --  8.1* 8.2* 8.3*   GFR: CrCl cannot be calculated (This lab value cannot be used to calculate  CrCl because it is not a number: <0.30). Liver Function Tests: No results for input(s): AST, ALT, ALKPHOS, BILITOT, PROT, ALBUMIN in the last 168 hours. No results for input(s): LIPASE, AMYLASE in the last 168 hours. No results for input(s): AMMONIA in the last 168 hours. Coagulation Profile: No results for input(s): INR, PROTIME in the last 168 hours. Cardiac Enzymes: No results for input(s): CKTOTAL, CKMB, CKMBINDEX, TROPONINI in the last 168 hours. BNP (last 3 results) No results for input(s): PROBNP in the last 8760 hours. HbA1C: No results for input(s): HGBA1C in the last 72 hours. CBG: Recent Labs  Lab 10/26/17 0736 10/26/17 1140 10/26/17 1624 10/26/17 2137 10/26/17 2343  GLUCAP 88 103* 113* 112* 113*   Lipid Profile: No results for input(s): CHOL, HDL, LDLCALC, TRIG, CHOLHDL, LDLDIRECT in the last 72 hours. Thyroid Function Tests: No results for input(s): TSH, T4TOTAL, FREET4, T3FREE, THYROIDAB in the last 72 hours. Anemia Panel: No results for input(s): VITAMINB12, FOLATE, FERRITIN, TIBC, IRON, RETICCTPCT in the last 72 hours. Urine analysis:    Component Value Date/Time   COLORURINE YELLOW 10/08/2017 1931   APPEARANCEUR CLEAR 10/08/2017 1931  LABSPEC 1.016 10/08/2017 1931   PHURINE 6.0 10/08/2017 1931   GLUCOSEU NEGATIVE 10/08/2017 1931   HGBUR NEGATIVE 10/08/2017 1931   BILIRUBINUR NEGATIVE 10/08/2017 1931   KETONESUR 5 (A) 10/08/2017 1931   PROTEINUR NEGATIVE 10/08/2017 1931   NITRITE NEGATIVE 10/08/2017 1931   LEUKOCYTESUR NEGATIVE 10/08/2017 1931   Sepsis Labs: @LABRCNTIP (procalcitonin:4,lacticidven:4)  )No results found for this or any previous visit (from the past 240 hour(s)).    Radiology Studies: No results found.   Scheduled Meds: . amLODipine  5 mg Oral Daily  . amphetamine-dextroamphetamine  30 mg Oral Daily  . atorvastatin  40 mg Oral q1800  . bisacodyl  5 mg Oral Daily  . cholecalciferol  1,000 Units Oral Daily  . dexamethasone  2 mg  Oral Daily  . Difluprednate  1 drop Left Eye BID  . feeding supplement (ENSURE ENLIVE)  237 mL Oral BID BM  . insulin aspart  0-9 Units Subcutaneous Q4H  . ketorolac  1 drop Left Eye Daily  . lacosamide  50 mg Oral BID  . mouth rinse  15 mL Mouth Rinse BID  . Melatonin  10-20 mg Oral QHS  . nystatin  5 mL Oral QID  . pantoprazole  40 mg Oral Daily   Continuous Infusions: . dextrose 5 % and 0.45% NaCl 75 mL/hr at 10/27/17 1046     LOS: 0 days   Time Spent in minutes   30 minutes   Teleshia Lemere D.O. on 10/27/2017 at 12:48 PM  Between 7am to 7pm - Please see pager noted on [email or web address]  After 7pm go to [email or web address]  And look for the night coverage person covering for me after hours  [facility] Office  [phone]

## 2017-10-27 NOTE — Progress Notes (Signed)
Patient daughter requested her mum blood sugar check to be stop. Daughter states her mum is not diabetic and want the doctor to stop the order for q4 cbg. Multiple attempts made to educate patient daughter that even though patient is not diabetic, her blood sugar need to be check because of her poor intakes and had episodes of hypoglycemia in the past. Daughter insist she want [place] not to monitor her mum blood sugar.

## 2017-10-28 DIAGNOSIS — C719 Malignant neoplasm of brain, unspecified: Secondary | ICD-10-CM | POA: Diagnosis not present

## 2017-10-28 DIAGNOSIS — E876 Hypokalemia: Secondary | ICD-10-CM | POA: Diagnosis not present

## 2017-10-28 DIAGNOSIS — Z885 Allergy status to narcotic agent status: Secondary | ICD-10-CM | POA: Diagnosis not present

## 2017-10-28 DIAGNOSIS — E118 Type 2 diabetes mellitus with unspecified complications: Secondary | ICD-10-CM | POA: Diagnosis not present

## 2017-10-28 DIAGNOSIS — K219 Gastro-esophageal reflux disease without esophagitis: Secondary | ICD-10-CM | POA: Diagnosis present

## 2017-10-28 DIAGNOSIS — F329 Major depressive disorder, single episode, unspecified: Secondary | ICD-10-CM | POA: Diagnosis present

## 2017-10-28 DIAGNOSIS — Z96652 Presence of left artificial knee joint: Secondary | ICD-10-CM | POA: Diagnosis not present

## 2017-10-28 DIAGNOSIS — E119 Type 2 diabetes mellitus without complications: Secondary | ICD-10-CM | POA: Diagnosis present

## 2017-10-28 DIAGNOSIS — I10 Essential (primary) hypertension: Secondary | ICD-10-CM | POA: Diagnosis not present

## 2017-10-28 DIAGNOSIS — F419 Anxiety disorder, unspecified: Secondary | ICD-10-CM | POA: Diagnosis present

## 2017-10-28 DIAGNOSIS — D696 Thrombocytopenia, unspecified: Secondary | ICD-10-CM | POA: Diagnosis not present

## 2017-10-28 DIAGNOSIS — B370 Candidal stomatitis: Secondary | ICD-10-CM | POA: Diagnosis not present

## 2017-10-28 DIAGNOSIS — Z515 Encounter for palliative care: Secondary | ICD-10-CM | POA: Diagnosis not present

## 2017-10-28 DIAGNOSIS — R69 Illness, unspecified: Secondary | ICD-10-CM | POA: Diagnosis not present

## 2017-10-28 DIAGNOSIS — Z7984 Long term (current) use of oral hypoglycemic drugs: Secondary | ICD-10-CM | POA: Diagnosis not present

## 2017-10-28 DIAGNOSIS — Z7189 Other specified counseling: Secondary | ICD-10-CM | POA: Diagnosis not present

## 2017-10-28 DIAGNOSIS — R64 Cachexia: Secondary | ICD-10-CM | POA: Diagnosis not present

## 2017-10-28 DIAGNOSIS — T451X5A Adverse effect of antineoplastic and immunosuppressive drugs, initial encounter: Secondary | ICD-10-CM | POA: Diagnosis not present

## 2017-10-28 DIAGNOSIS — E860 Dehydration: Secondary | ICD-10-CM | POA: Diagnosis not present

## 2017-10-28 DIAGNOSIS — Z85841 Personal history of malignant neoplasm of brain: Secondary | ICD-10-CM | POA: Diagnosis not present

## 2017-10-28 DIAGNOSIS — Z888 Allergy status to other drugs, medicaments and biological substances status: Secondary | ICD-10-CM | POA: Diagnosis not present

## 2017-10-28 DIAGNOSIS — Z9221 Personal history of antineoplastic chemotherapy: Secondary | ICD-10-CM | POA: Diagnosis not present

## 2017-10-28 DIAGNOSIS — Z6824 Body mass index (BMI) 24.0-24.9, adult: Secondary | ICD-10-CM | POA: Diagnosis not present

## 2017-10-28 DIAGNOSIS — Z923 Personal history of irradiation: Secondary | ICD-10-CM | POA: Diagnosis not present

## 2017-10-28 DIAGNOSIS — Z8249 Family history of ischemic heart disease and other diseases of the circulatory system: Secondary | ICD-10-CM | POA: Diagnosis not present

## 2017-10-28 DIAGNOSIS — R531 Weakness: Secondary | ICD-10-CM | POA: Diagnosis not present

## 2017-10-28 DIAGNOSIS — E785 Hyperlipidemia, unspecified: Secondary | ICD-10-CM | POA: Diagnosis present

## 2017-10-28 DIAGNOSIS — G9349 Other encephalopathy: Secondary | ICD-10-CM | POA: Diagnosis not present

## 2017-10-28 DIAGNOSIS — R1310 Dysphagia, unspecified: Secondary | ICD-10-CM | POA: Diagnosis not present

## 2017-10-28 LAB — BASIC METABOLIC PANEL
Anion gap: 5 (ref 5–15)
BUN: 10 mg/dL (ref 8–23)
CALCIUM: 8.5 mg/dL — AB (ref 8.9–10.3)
CHLORIDE: 115 mmol/L — AB (ref 98–111)
CO2: 23 mmol/L (ref 22–32)
GLUCOSE: 118 mg/dL — AB (ref 70–99)
Potassium: 3.5 mmol/L (ref 3.5–5.1)
Sodium: 143 mmol/L (ref 135–145)

## 2017-10-28 LAB — CBC
HCT: 29.8 % — ABNORMAL LOW (ref 36.0–46.0)
Hemoglobin: 9.3 g/dL — ABNORMAL LOW (ref 12.0–15.0)
MCH: 29.9 pg (ref 26.0–34.0)
MCHC: 31.2 g/dL (ref 30.0–36.0)
MCV: 95.8 fL (ref 78.0–100.0)
Platelets: 42 10*3/uL — ABNORMAL LOW (ref 150–400)
RBC: 3.11 MIL/uL — ABNORMAL LOW (ref 3.87–5.11)
RDW: 20.1 % — AB (ref 11.5–15.5)
WBC: 3.2 10*3/uL — ABNORMAL LOW (ref 4.0–10.5)

## 2017-10-28 LAB — VITAMIN B12: Vitamin B-12: 330 pg/mL (ref 180–914)

## 2017-10-28 NOTE — Progress Notes (Addendum)
Pt continues to exhibit poor intake; refuses to eat & will barely take crushed medicine in pudding. Pt gets agitated with mouth care. DS & NS RNs have been trying to use lidocaine & nystatin; patient bites swab. Pt grimaces & appears to be in pain when repositioned or given care(gown & linen change, etc.) Pain med admin when patient responed yes to pain assessment. Spouse arrived this am; updated.

## 2017-10-28 NOTE — Progress Notes (Signed)
Patient's family refusing CBG's again today. Family has been educated.Kelly Maxwell

## 2017-10-28 NOTE — Progress Notes (Signed)
Discussed disposition plans with pt's husband along with CM at bedside as pt sleeping. Pt's husband explains long term goal is to have pt home "and give her the best quality of life for the time she has left." States his main concern is her dehydration. Admits that he is discussing pt's prognosis with oncology, attending, and palliative team, and is unsure of outcome clinical decisions and how they might impact disposition.  Husband states he is not ready to enlist hospice as "she keeps bouncing back." States that he is open to SNF for rehab of insurance authorizes and pt able to participate.  Husband interested in [place] however this facility out of network for [employer] and would require 40% copay- husband states he prefers SNFs in network. Provided with other bed offers and husband going to visit facilities and make selections.   Sharren Bridge, MSW, LCSW Clinical Social Work 10/28/2017 [phone]

## 2017-10-28 NOTE — Progress Notes (Signed)
Daily Progress Note   Patient Name: Kelly Maxwell       Date: 10/28/2017 DOB: 1945-05-12  Age: 72 y.o. MRN#: [id number] Attending Physician: Cristal Ford, DO Primary Care Physician: Wenda Low, MD Admit Date: 10/24/2017  Reason for Consultation/Follow-up: Establishing goals of care  Subjective:  Patient appears weak, lethargic, resting in bed Periodically, she will open her eyes, she does respond some to her husband   Length of Stay: 0  Current Medications: Scheduled Meds:  . amLODipine  5 mg Oral Daily  . amphetamine-dextroamphetamine  30 mg Oral Daily  . atorvastatin  40 mg Oral q1800  . bisacodyl  5 mg Oral Daily  . cholecalciferol  1,000 Units Oral Daily  . dexamethasone  2 mg Oral Daily  . Difluprednate  1 drop Left Eye BID  . feeding supplement (ENSURE ENLIVE)  237 mL Oral BID BM  . insulin aspart  0-9 Units Subcutaneous TID WC  . ketorolac  1 drop Left Eye Daily  . lacosamide  50 mg Oral BID  . mouth rinse  15 mL Mouth Rinse BID  . Melatonin  10-20 mg Oral QHS  . nystatin  5 mL Oral QID  . pantoprazole  40 mg Oral Daily    Continuous Infusions: . dextrose 5 % and 0.45% NaCl 75 mL/hr at 10/28/17 0053    PRN Meds: acetaminophen **OR** acetaminophen, fentaNYL (SUBLIMAZE) injection, lidocaine, magic mouthwash w/lidocaine, ondansetron **OR** ondansetron (ZOFRAN) IV, senna-docusate  Physical Exam         Weak appearing lady Mild distress Regular pattern of breathing Abdomen is not distended Awakens some, oriented to self Doesn't converse much  Vital Signs: BP 134/73 (BP Location: Right Arm)   Pulse 70   Temp 98.4 F (36.9 C) (Oral)   Resp 18   Ht 5' 4"  (1.626 m)   Wt 65.8 kg   SpO2 97%   BMI 24.90 kg/m  SpO2: SpO2: 97 % O2 Device: O2 Device: Room  Air O2 Flow Rate:    Intake/output summary:   Intake/Output Summary (Last 24 hours) at 10/28/2017 1445 Last data filed at 10/28/2017 0713 Gross per 24 hour  Intake 2436.97 ml  Output 1250 ml  Net 1186.97 ml   LBM: Last BM Date: 10/28/17 Baseline Weight: Weight: 65 kg Most recent weight: Weight: 65.8 kg  Palliative Assessment/Data:    Flowsheet Rows     Most Recent Value  Intake Tab  Referral Department  Hospitalist  Unit at Time of Referral  Oncology Unit  Palliative Care Primary Diagnosis  Cancer  Date Notified  10/25/17  Palliative Care Type  New Palliative care  Reason for referral  Clarify Goals of Care  Date of Admission  10/25/17  Date first seen by Palliative Care  10/26/17  # of days Palliative referral response time  1 Day(s)  # of days IP prior to Palliative referral  0  Clinical Assessment  Psychosocial & Spiritual Assessment  Palliative Care Outcomes      Patient Active Problem List   Diagnosis Date Noted  . Palliative care encounter 10/24/2017  . Dysphagia 10/24/2017  . Thrombocytopenia ([place]) 10/24/2017  . DM2 (diabetes mellitus, type 2) ([place]) 10/09/2017  . Unsteady gait 09/19/2017  . Weakness generalized 09/19/2017  . Counseling regarding advanced care planning and goals of care 09/19/2017  . HTN (hypertension) 07/19/2017  . Acute metabolic encephalopathy 42/59/5638  . Dehydration 07/16/2017  . 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 Anxiety and depression 09/03/2015  . Personal history of other infectious and parasitic diseases 09/03/2015  . 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 HLD (hyperlipidemia)   . GERD (gastroesophageal reflux disease)   . PONV (postoperative nausea and vomiting)   . 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 Hiatal hernia    PPS 30% Palliative Care Assessment & Plan   Patient Profile:    Assessment:  gliosarcoma Weakness Generalized  deconditioning Cancer related cachexia Generalized suffering, pain in throat Dehydration Multi factorial encephalopathy   Recommendations/Plan: Family meeting with patient's husband at bedside again today, daughter Kelly Maxwell wasn't present.  Patient's husband states that the patient was diagnosed with this type of cancer about 6 years ago. He states that the last year the patient has declined. He recalls her cancer history, patient went to do, was recently on Avastin, she hasn't had a good response to Avastin, neither to [employer].   Patient's husband states that his main concerns are the patient's ongoing depression ongoing dehydration. He is appreciative of our concerns with regards to PICC line. He states that she has gone to rehabilitation before. He is hopeful that the patient will benefit from short-term rehabilitation stay and bounce back. He talks about the financial burden the patient's cancer has caused thus far.  As far as disposition, his first choice is that the patient be able to go to a skilled nursing facility for rehabilitation attempt. He is appreciative of the information received from clinical social worker met again, he is awaiting more information about whether or not the patient will be accepted to collapse nursing facility.  If not, he wishes to discuss with advance Home care, home health care options, wishes to continue with home-based palliative care that the patient already has now Ms. Huston Foley, NP through hospice and palliative care center of [place].  Patient's husband states that his first wife died of cancer several years ago. He states that she required opioids. He regrets that she was too drugged up. His goals are not hospice focused at this point in time.He is asking about PICC line placement.   We reviewed CODE STATUS in great detail. We discussed about full code versus DO NOT RESUSCITATE/DO NOT INTUBATE in great detail. Patient's husband states that he has discussed  this with the patient's daughter as well as  with the patient. Full code for now.      Code Status:    Code Status Orders  (From admission, onward)         Start     Ordered   10/24/17 2326  Full code  Continuous     10/24/17 2328        Code Status History    Date Active Date Inactive Code Status Order ID Comments User Context   10/09/2017 0111 10/11/2017 1705 Full Code [id number]  [name], DO ED   07/16/2017 2238 07/19/2017 2154 Full Code [id number]  [name], DO Inpatient   09/03/2015 0605 09/06/2015 1440 Full Code [id number]  [name], MD Inpatient       Prognosis:   guarded   Patient is hospice eligible, in my opinion.   Discharge Planning:  To Be Determined  Care plan was discussed with  Patient, husband, bedside RN.   Thank you for allowing the Palliative Medicine Team to assist in the care of this patient.   Time In: 1330 Time Out: 14.05 Total Time 35 Prolonged Time Billed No       Greater than 50%  of this time was spent counseling and coordinating care related to the above assessment and plan.  Loistine Chance, MD [phone]  Please contact Palliative Medicine Team phone at [phone] for questions and concerns.

## 2017-10-28 NOTE — Progress Notes (Signed)
Patient's daughter refused CBG check again tonight.  She reports patient is not diabetic and does not need to be poked.  Daughter was educated on importance of monitoring sugars, especially with decreased oral intake.  Daughter continues to reiterate that she does not want her mother to be stuck.Roderick Pee

## 2017-10-28 NOTE — Progress Notes (Signed)
PROGRESS NOTE    Kelly Maxwell  ACZ:[id number] DOB: 05/02/1945 DOA: 10/24/2017 PCP: Wenda Low, MD   Brief Narrative:  HPI on 10/24/2017 by Dr. Mitzi Hansen Kelly Maxwell is a 72 y.o. 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  Per discussion with family at the bedside, she has been refusing to eat or drink for the past 2 days, was seen by home health today, suspected to have thrush, and was started on nystatin which she did take a dose of at home.  She has had recent admissions with AMS that was felt to be secondary, at least in part, to dehydration.  She is followed by palliative care, remains full code, but family has indicated that they are in favor of transition to hospice if she is unable to continue on chemotherapy.  Patient does not express any specific complaints, but family reports that she seems to be in pain when she tries to drink or eat recently. SNF was recommended at time of recent hospital discharge but family elected to take her home. She has had RLE swelling that was evaluated with negative venous US (she also had CTA chest negative for PE) during the admission earlier this month.  Interim history Patient admitted for dehydration and altered mental status.  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  Palliative care consulted and appreciated.  Assessment & Plan   Dehydration with acute encephalopathy -Patient presents with poor oral intake for the last several days -Possibly secondary to thrush and mouth  sores -Continue IV fluids, nystatin swish and spit, mouthwash, viscous lidocaine PRN -Currently AAOx1 (self)  Odynophagia -Suspect patient has possible thrush and mouth sores as listed above -Continue treatment and plan as above -Patient continues to have poor oral intake and is requiring IV fluids with D5 half-normal  Gliosarcoma -Status post radiation in 2013, currently with progression on chemotherapy -Followed by neuro-oncology Dr. Mickeal Skinner -Discussed with Dr. Mickeal Skinner, will see the patient in consult -Continue Decadron, Vimpat, Adderall for associated fatigue  Thrombocytopenia -Suspect secondary to chemotherapy -Currently no active bleeding -Platelets currently 30 -Continue to monitor CBC  Essential hypertension -Continue amlodipine  Diabetes mellitus, type II -Metformin held -Continue insulin sliding scale with CBG monitoring  Chronic Normocytic anemia -hemoglobin currently 9.3 baseline approximately 10-11 -Currently on IV fluids, suspect some dilutional component -Monitor CBC  Hypokalemia -Replaced, continue to monitor BMP  Goals of care -Discussed with Dr. Mickeal Skinner, feels that patient has poor quality of life and functional status, no further recommendations as far as treatment. Feels patient should proceed with hospice however daughter declines but is open to palliative care consult. -Palliative care consulted and appreciated -Dr. Rowe Pavy and I had a long conversation with the patient's daughter, husband. Expressed that PICC line or PEG tube carry risk of infection. Daughter and husband feel that patient gets dehydrated and just need fluids and her mental status improves.  Daughter states that patient has will to live and this was discussed with the patient yesterday evening. -IV team consulted at the request of family to discuss risks/benefits of PICC  DVT Prophylaxis  SCDs  Code Status: Full  Family Communication: None at bedside  Disposition Plan: Disposition  pending.  PT recommending SNF, questions family's intentions rating SNF versus home.  Consultants Neuro-Oncology Palliative care Palliative Care  Procedures  None  Antibiotics   Anti-infectives (From admission, onward)   None      Subjective:   Kelly Maxwell seen and examined today.  Currently only alert to self.   Objective:   Vitals:   10/27/17 0530 10/27/17 1450 10/27/17 2142 10/28/17 0613  BP: (!) 145/81 (!) 137/92 (!) 142/79 139/78  Pulse: 65 89 87 85  Resp: 17 16 16 14   Temp: 97.6 F (36.4 C) 97.8 F (36.6 C) 99.8 F (37.7 C) 97.9 F (36.6 C)  TempSrc: Oral Oral Oral Oral  SpO2: 97% 95% 96% 97%  Weight: 65.8 kg   65.8 kg  Height:        Intake/Output Summary (Last 24 hours) at 10/28/2017 1339 Last data filed at 10/28/2017 7628 Gross per 24 hour  Intake 2436.97 ml  Output 1250 ml  Net 1186.97 ml   Filed Weights   10/26/17 0535 10/27/17 0530 10/28/17 0613  Weight: 67.5 kg 65.8 kg 65.8 kg   Exam  General: Well developed, chronically ill-appearing, NAD  HEENT: NCAT, mucous membranes dry  Neck: Supple  Cardiovascular: S1 S2 auscultated, no rubs, murmurs or gallops. Regular rate and rhythm.  Respiratory: Clear to auscultation bilaterally with equal chest rise  Abdomen: Soft, nontender, nondistended, + bowel sounds  Extremities: warm dry without cyanosis clubbing or edema  Neuro: AAOx1, self only. Does not follow commands  Data Reviewed: I have personally reviewed following labs and imaging studies  CBC: Recent Labs  Lab 10/24/17 2116 10/24/17 2136 10/25/17 0354 10/26/17 0401 10/27/17 0509 10/28/17 0455  WBC 4.9  --  3.8* 2.9* 3.2* 3.2*  NEUTROABS 4.4  --  3.1  --   --   --   HGB 11.1* 10.5* 9.9* 9.3* 9.8* 9.3*  HCT 35.3* 31.0* 31.3* 29.5* 30.4* 29.8*  MCV 95.4  --  95.4 96.1 95.0 95.8  PLT 51*  --  38* 29* 30* 42*   Basic Metabolic Panel: Recent Labs  Lab 10/24/17 2136 10/25/17 0354 10/26/17 0401 10/27/17 0509 10/28/17 0455  NA  140 142 143 141 143  K 3.7 3.7 3.2* 3.5 3.5  CL 107 115* 115* 115* 115*  CO2  --  20* 23 21* 23  GLUCOSE 103* 74 97 97 118*  BUN 10 10 5* 5* 10  CREATININE 0.30* 0.31* 0.35* <0.30* <0.30*  CALCIUM  --  8.1* 8.2* 8.3* 8.5*   GFR: CrCl cannot be calculated (This lab value cannot be used to calculate CrCl because it is not a number: <0.30). Liver Function Tests: No results for input(s): AST, ALT, ALKPHOS, BILITOT, PROT, ALBUMIN in the last 168 hours. No results for input(s): LIPASE, AMYLASE in the last 168 hours. No results for input(s): AMMONIA in the last 168 hours. Coagulation Profile: No results for input(s): INR, PROTIME in the last 168 hours. Cardiac Enzymes: No results for input(s): CKTOTAL, CKMB, CKMBINDEX, TROPONINI in the last 168 hours. BNP (last 3 results) No results for input(s): PROBNP in the last 8760 hours. HbA1C: No results for input(s): HGBA1C in the last 72 hours. CBG: Recent Labs  Lab 10/26/17 0736 10/26/17 1140 10/26/17 1624 10/26/17 2137 10/26/17 2343  GLUCAP 88 103* 113* 112* 113*   Lipid Profile: No results for input(s): CHOL, HDL, LDLCALC, TRIG, CHOLHDL, LDLDIRECT in the last 72 hours. Thyroid  Function Tests: No results for input(s): TSH, T4TOTAL, FREET4, T3FREE, THYROIDAB in the last 72 hours. Anemia Panel: Recent Labs    10/28/17 0455  VITAMINB12 330   Urine analysis:    Component Value Date/Time   COLORURINE YELLOW 10/08/2017 1931   APPEARANCEUR CLEAR 10/08/2017 1931   LABSPEC 1.016 10/08/2017 1931   PHURINE 6.0 10/08/2017 1931   GLUCOSEU NEGATIVE 10/08/2017 1931   HGBUR NEGATIVE 10/08/2017 1931   BILIRUBINUR NEGATIVE 10/08/2017 1931   KETONESUR 5 (A) 10/08/2017 1931   PROTEINUR NEGATIVE 10/08/2017 1931   NITRITE NEGATIVE 10/08/2017 1931   LEUKOCYTESUR NEGATIVE 10/08/2017 1931   Sepsis Labs: @LABRCNTIP (procalcitonin:4,lacticidven:4)  )No results found for this or any previous visit (from the past 240 hour(s)).    Radiology  Studies: No results found.   Scheduled Meds: . amLODipine  5 mg Oral Daily  . amphetamine-dextroamphetamine  30 mg Oral Daily  . atorvastatin  40 mg Oral q1800  . bisacodyl  5 mg Oral Daily  . cholecalciferol  1,000 Units Oral Daily  . dexamethasone  2 mg Oral Daily  . Difluprednate  1 drop Left Eye BID  . feeding supplement (ENSURE ENLIVE)  237 mL Oral BID BM  . insulin aspart  0-9 Units Subcutaneous TID WC  . ketorolac  1 drop Left Eye Daily  . lacosamide  50 mg Oral BID  . mouth rinse  15 mL Mouth Rinse BID  . Melatonin  10-20 mg Oral QHS  . nystatin  5 mL Oral QID  . pantoprazole  40 mg Oral Daily   Continuous Infusions: . dextrose 5 % and 0.45% NaCl 75 mL/hr at 10/28/17 0053     LOS: 0 days   Time Spent in minutes   30 minutes   Redith Drach D.O. on 10/28/2017 at 1:39 PM  Between 7am to 7pm - Please see pager noted on [email or web address]  After 7pm go to [email or web address]  And look for the night coverage person covering for me after hours  [facility] Office  [phone]

## 2017-10-29 ENCOUNTER — Inpatient Hospital Stay: Payer: Self-pay

## 2017-10-29 LAB — BASIC METABOLIC PANEL
Anion gap: 7 (ref 5–15)
BUN: 8 mg/dL (ref 8–23)
CALCIUM: 8.7 mg/dL — AB (ref 8.9–10.3)
CO2: 22 mmol/L (ref 22–32)
Chloride: 113 mmol/L — ABNORMAL HIGH (ref 98–111)
Glucose, Bld: 99 mg/dL (ref 70–99)
Potassium: 3.4 mmol/L — ABNORMAL LOW (ref 3.5–5.1)
SODIUM: 142 mmol/L (ref 135–145)

## 2017-10-29 LAB — CBC
HCT: 29.2 % — ABNORMAL LOW (ref 36.0–46.0)
Hemoglobin: 9.3 g/dL — ABNORMAL LOW (ref 12.0–15.0)
MCH: 30.4 pg (ref 26.0–34.0)
MCHC: 31.8 g/dL (ref 30.0–36.0)
MCV: 95.4 fL (ref 78.0–100.0)
PLATELETS: 57 10*3/uL — AB (ref 150–400)
RBC: 3.06 MIL/uL — ABNORMAL LOW (ref 3.87–5.11)
RDW: 20.2 % — AB (ref 11.5–15.5)
WBC: 2.9 10*3/uL — ABNORMAL LOW (ref 4.0–10.5)

## 2017-10-29 LAB — VITAMIN D 25 HYDROXY (VIT D DEFICIENCY, FRACTURES): VIT D 25 HYDROXY: 29.6 ng/mL — AB (ref 30.0–100.0)

## 2017-10-29 MED ORDER — FLUCONAZOLE 40 MG/ML PO SUSR
100.0000 mg | Freq: Every day | ORAL | Status: DC
  Administered 2017-10-29 – 2017-10-31 (×3): 100 mg via ORAL
  Filled 2017-10-29 (×3): qty 2.5

## 2017-10-29 MED ORDER — SENNOSIDES-DOCUSATE SODIUM 8.6-50 MG PO TABS
1.0000 | ORAL_TABLET | Freq: Two times a day (BID) | ORAL | Status: DC
  Administered 2017-10-29 – 2017-10-31 (×4): 1 via ORAL
  Filled 2017-10-29 (×4): qty 1

## 2017-10-29 MED ORDER — POTASSIUM CHLORIDE 10 MEQ/100ML IV SOLN
10.0000 meq | INTRAVENOUS | Status: AC
  Administered 2017-10-29 (×4): 10 meq via INTRAVENOUS

## 2017-10-29 NOTE — Care Management (Signed)
CM consult for home health for IV hydration per Dr. Mickeal Skinner. This CM contacted [facility] IV infusion rep to alert of home need. [facility] to contact Dr. Mickeal Skinner for specific orders related to IV infusion at home. This CM spoke with pt husband at bedside who also confirms that plan is for pt to dc home instead of SNF. Husband states that he will take her home via car. Marney Doctor RN,BSN [phone]

## 2017-10-29 NOTE — Progress Notes (Signed)
Physical Therapy Treatment- late entry for 10/28/17 Patient Details Name: Kelly Maxwell MRN: [id number] DOB: 11-Jun-1945 Today's Date: 10/29/2017    History of Present Illness 72 y.o. female with medical history significant for hypertension, type 2 diabetes mellitus, and gliosarcoma status post resection and radiation in 2013, currently on chemotherapy for progression of disease, and now presenting to the ED for evaluation of dehydration after 2 days of refusal to eat or drink    PT Comments    The patient is very weak. Patient is more alert than what RN reported that patient was not following commands  Earlier in day. Assisted to sitting at bedside qhich was th most that patient could tolerate. Spouse present and reports there are paid caregivers in the home. Continue PT.  Follow Up Recommendations  SNF;Supervision/Assistance - 24 hour(per spouse, has "paid" caregivers)     Equipment Recommendations  None recommended by PT    Recommendations for Other Services       Precautions / Restrictions Precautions Precautions: Fall Precaution Comments: plts low    Mobility  Bed Mobility   Bed Mobility: Supine to Sit     Supine to sit: Max assist Sit to supine: Max assist   General bed mobility comments: max A to raise trunk, mod A to advance LEs to edge of bed  Transfers                 General transfer comment: unable to attempt, patient leaning forward, very weak  Ambulation/Gait                 Stairs             Wheelchair Mobility    Modified Rankin (Stroke Patients Only)       Balance Overall balance assessment: Needs assistance Sitting-balance support: Feet supported;Bilateral upper extremity supported Sitting balance-Leahy Scale: Poor                                      Cognition Arousal/Alertness: Awake/alert Behavior During Therapy: Flat affect                                   General Comments:  delayed processing and initiation; decreased memory; difficulty with some one step commands, pt can state her name but not birthdate, spouse reports cognition fluctuates at baseline      Exercises      General Comments        Pertinent Vitals/Pain Pain Assessment: Faces Faces Pain Scale: Hurts little more Pain Location: non specific Pain Descriptors / Indicators: Discomfort Pain Intervention(s): Monitored during session;Limited activity within patient's tolerance    Home Living                      Prior Function            PT Goals (current goals can now be found in the care plan section) Progress towards PT goals: Progressing toward goals    Frequency    Min 2X/week      PT Plan Frequency needs to be updated    Co-evaluation              AM-PAC PT "6 Clicks" Daily Activity  Outcome Measure  Difficulty turning over in bed (including adjusting bedclothes, sheets and blankets)?: Unable Difficulty moving from lying  on back to sitting on the side of the bed? : Unable Difficulty sitting down on and standing up from a chair with arms (e.g., wheelchair, bedside commode, etc,.)?: Unable Help needed moving to and from a bed to chair (including a wheelchair)?: Total Help needed walking in hospital room?: Total Help needed climbing 3-5 steps with a railing? : Total 6 Click Score: 6    End of Session   Activity Tolerance: Patient limited by fatigue Patient left: in bed;with call bell/phone within reach;with bed alarm set;with family/visitor present Nurse Communication: Mobility status PT Visit Diagnosis: Other abnormalities of gait and mobility (R26.89);Muscle weakness (generalized) (M62.81);Adult, failure to thrive (R62.7)     Time:  - 2919-1660    Charges:    1 therapeutic activity                        Claretha Cooper 10/29/2017, 7:09 AM Tresa Endo PT [phone]

## 2017-10-29 NOTE — Progress Notes (Signed)
MD, [name] (patient's daughter) would like to be called and updated on plan of care. She also has several questions.  Erin's number is [phone].Roderick Pee

## 2017-10-29 NOTE — Progress Notes (Signed)
PROGRESS NOTE    Kelly Maxwell  ERD:[id number] DOB: 23-Nov-1945 DOA: 10/24/2017 PCP: Wenda Low, MD   Brief Narrative:  HPI on 10/24/2017 by Dr. Mitzi Hansen Kelly Maxwell is a 72 y.o. 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  Per discussion with family at the bedside, she has been refusing to eat or drink for the past 2 days, was seen by home health today, suspected to have thrush, and was started on nystatin which she did take a dose of at home.  She has had recent admissions with AMS that was felt to be secondary, at least in part, to dehydration.  She is followed by palliative care, remains full code, but family has indicated that they are in favor of transition to hospice if she is unable to continue on chemotherapy.  Patient does not express any specific complaints, but family reports that she seems to be in pain when she tries to drink or eat recently. SNF was recommended at time of recent hospital discharge but family elected to take her home. She has had RLE swelling that was evaluated with negative venous US (she also had CTA chest negative for PE) during the admission earlier this month.  Interim history Patient admitted for dehydration and altered mental status.  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  Palliative care consulted and appreciated.  Assessment & Plan   Dehydration with acute encephalopathy -Patient presents with poor oral intake for the last several days -Possibly secondary to thrush and mouth  sores -Continue IV fluids, nystatin swish and spit, mouthwash, viscous lidocaine PRN -Currently AAOx1 (self) -will add on diflucan   Odynophagia -Suspect patient has possible thrush and mouth sores as listed above -Continue treatment and plan as above -Patient continues to have poor oral intake and is requiring IV fluids with D5 half-normal  Gliosarcoma -Status post radiation in 2013, currently with progression on chemotherapy -Followed by neuro-oncology Dr. Mickeal Skinner -Discussed with Dr. Mickeal Skinner, will see the patient in consult -Continue Decadron, Vimpat, Adderall for associated fatigue  Thrombocytopenia -Suspect secondary to chemotherapy -Currently no active bleeding -Platelets currently 57 -Continue to monitor CBC  Essential hypertension -Continue amlodipine  Diabetes mellitus, type II -Metformin held (patient's daughter states she does not have diabetes- metformin is used for the gliosarcoma and would like to have her sugars checked once or twice a day)  Chronic Normocytic anemia -hemoglobin currently 9.3, baseline approximately 10-11 -Currently on IV fluids, suspect some dilutional component -Monitor CBC  Hypokalemia -K 3.4  -will replace and continue to monitor BMP  Goals of care -Discussed with Dr. Mickeal Skinner, feels that patient has poor quality of life and functional status, no further recommendations as far as treatment. Feels patient should proceed with hospice however daughter declines but is open to palliative care consult. -Palliative care consulted and appreciated -Dr. Rowe Pavy and I had a long conversation with the patient's daughter, husband. Expressed that PICC line or PEG tube carry risk of infection. Daughter and husband feel that patient gets dehydrated and just need fluids and her mental status improves.  Daughter states that patient has  will to live and this was discussed with the patient yesterday evening. -IV team consulted at the request of family to discuss  risks/benefits of PICC -Daughter stated that Dr. Mickeal Skinner agreed to a PICC. I have called and discussed this with Dr. Mickeal Skinner and he does not feel that patient should have a PICC unless it is for more palliative care. He states he feels that the patient may need PRN hydration at home.  -case management consulted   DVT Prophylaxis  SCDs  Code Status: Full  Family Communication: None at bedside.  Gust with husband via phone, answered all questions.  Was then asked to call the daughter.  Discussed with patient's daughter via phone, answered all questions.   Disposition Plan: Disposition pending.  PT recommending SNF, questions family's intentions rating SNF versus home.  Consultants Neuro-Oncology Palliative care Palliative Care  Procedures  None  Antibiotics   Anti-infectives (From admission, onward)   Start     Dose/Rate Route Frequency Ordered Stop   10/29/17 1500  fluconazole (DIFLUCAN) 40 MG/ML suspension 100 mg     100 mg Oral Daily 10/29/17 1319        Subjective:   Kelly Maxwell seen and examined today.  Currently only alert to self.   Objective:   Vitals:   10/28/17 0613 10/28/17 1356 10/28/17 2057 10/29/17 0655  BP: 139/78 134/73 128/82 132/70  Pulse: 85 70 66 67  Resp: 14 18 20 16   Temp: 97.9 F (36.6 C) 98.4 F (36.9 C) 99.3 F (37.4 C) 98.3 F (36.8 C)  TempSrc: Oral Oral Oral Oral  SpO2: 97% 97% 98% 95%  Weight: 65.8 kg   61.5 kg  Height:        Intake/Output Summary (Last 24 hours) at 10/29/2017 1333 Last data filed at 10/29/2017 0738 Gross per 24 hour  Intake 1893.83 ml  Output 850 ml  Net 1043.83 ml   Filed Weights   10/27/17 0530 10/28/17 0613 10/29/17 0655  Weight: 65.8 kg 65.8 kg 61.5 kg   Exam  General: Well developed, chronically ill appearing, NAD  HEENT: NCAT, mucous membranes moist.   Neck: Supple  Cardiovascular: S1 S2 auscultated, RRR, no murmurs  Respiratory: Clear to auscultation bilaterally with equal chest rise  Abdomen:  Soft, nontender, nondistended, + bowel sounds  Extremities: warm dry without cyanosis clubbing or edema  Neuro: AAOx1, nonfocal   Data Reviewed: I have personally reviewed following labs and imaging studies  CBC: Recent Labs  Lab 10/24/17 2116  10/25/17 0354 10/26/17 0401 10/27/17 0509 10/28/17 0455 10/29/17 0324  WBC 4.9  --  3.8* 2.9* 3.2* 3.2* 2.9*  NEUTROABS 4.4  --  3.1  --   --   --   --   HGB 11.1*   < > 9.9* 9.3* 9.8* 9.3* 9.3*  HCT 35.3*   < > 31.3* 29.5* 30.4* 29.8* 29.2*  MCV 95.4  --  95.4 96.1 95.0 95.8 95.4  PLT 51*  --  38* 29* 30* 42* 57*   < > = values in this interval not displayed.   Basic Metabolic Panel: Recent Labs  Lab 10/25/17 0354 10/26/17 0401 10/27/17 0509 10/28/17 0455 10/29/17 0324  NA 142 143 141 143 142  K 3.7 3.2* 3.5 3.5 3.4*  CL 115* 115* 115* 115* 113*  CO2 20* 23 21* 23 22  GLUCOSE 74 97 97 118* 99  BUN 10 5* 5* 10 8  CREATININE 0.31* 0.35* <0.30* <0.30* <0.30*  CALCIUM 8.1* 8.2* 8.3* 8.5* 8.7*  GFR: CrCl cannot be calculated (This lab value cannot be used to calculate CrCl because it is not a number: <0.30). Liver Function Tests: No results for input(s): AST, ALT, ALKPHOS, BILITOT, PROT, ALBUMIN in the last 168 hours. No results for input(s): LIPASE, AMYLASE in the last 168 hours. No results for input(s): AMMONIA in the last 168 hours. Coagulation Profile: No results for input(s): INR, PROTIME in the last 168 hours. Cardiac Enzymes: No results for input(s): CKTOTAL, CKMB, CKMBINDEX, TROPONINI in the last 168 hours. BNP (last 3 results) No results for input(s): PROBNP in the last 8760 hours. HbA1C: No results for input(s): HGBA1C in the last 72 hours. CBG: Recent Labs  Lab 10/26/17 0736 10/26/17 1140 10/26/17 1624 10/26/17 2137 10/26/17 2343  GLUCAP 88 103* 113* 112* 113*   Lipid Profile: No results for input(s): CHOL, HDL, LDLCALC, TRIG, CHOLHDL, LDLDIRECT in the last 72 hours. Thyroid Function Tests: No  results for input(s): TSH, T4TOTAL, FREET4, T3FREE, THYROIDAB in the last 72 hours. Anemia Panel: Recent Labs    10/28/17 0455  VITAMINB12 330   Urine analysis:    Component Value Date/Time   COLORURINE YELLOW 10/08/2017 1931   APPEARANCEUR CLEAR 10/08/2017 1931   LABSPEC 1.016 10/08/2017 1931   PHURINE 6.0 10/08/2017 1931   GLUCOSEU NEGATIVE 10/08/2017 1931   HGBUR NEGATIVE 10/08/2017 1931   BILIRUBINUR NEGATIVE 10/08/2017 1931   KETONESUR 5 (A) 10/08/2017 1931   PROTEINUR NEGATIVE 10/08/2017 1931   NITRITE NEGATIVE 10/08/2017 1931   LEUKOCYTESUR NEGATIVE 10/08/2017 1931   Sepsis Labs: @LABRCNTIP (procalcitonin:4,lacticidven:4)  )No results found for this or any previous visit (from the past 240 hour(s)).    Radiology Studies: [place] Ekg Site Rite  Result Date: 10/29/2017 If [facility] image not attached, placement could not be confirmed due to current cardiac rhythm.    Scheduled Meds: . amLODipine  5 mg Oral Daily  . amphetamine-dextroamphetamine  30 mg Oral Daily  . atorvastatin  40 mg Oral q1800  . bisacodyl  5 mg Oral Daily  . cholecalciferol  1,000 Units Oral Daily  . dexamethasone  2 mg Oral Daily  . Difluprednate  1 drop Left Eye BID  . feeding supplement (ENSURE ENLIVE)  237 mL Oral BID BM  . fluconazole  100 mg Oral Daily  . insulin aspart  0-9 Units Subcutaneous TID WC  . ketorolac  1 drop Left Eye Daily  . lacosamide  50 mg Oral BID  . mouth rinse  15 mL Mouth Rinse BID  . Melatonin  10-20 mg Oral QHS  . nystatin  5 mL Oral QID  . pantoprazole  40 mg Oral Daily  . senna-docusate  1 tablet Oral BID   Continuous Infusions: . dextrose 5 % and 0.45% NaCl 75 mL/hr at 10/29/17 0423     LOS: 1 day   Time Spent in minutes   45 minutes (greater than 50% of time spent with patient face to face, as well as reviewing old records, calling consults, and formulating a plan)  Riven Beebe D.O. on 10/29/2017 at 1:33 PM  Between 7am to 7pm - Please see pager  noted on [email or web address]  After 7pm go to [email or web address]  And look for the night coverage person covering for me after hours  [facility] Office  [phone]

## 2017-10-29 NOTE — Progress Notes (Signed)
Palliative Medicine RN Note: Rec'd call from pt's husband reporting that Vaslow has ordered PICC and that they will go home with [facility] He had questions about timing of PICC placement and d/c; unfortunately, we don't have any influence or access to those answers. He verbalized understanding, and I updated Dr Domingo Cocking.  Marjie Skiff Lala Been, RN, BSN, [facility] Palliative Medicine Team 10/29/2017 4:22 PM Office [phone]

## 2017-10-29 NOTE — Progress Notes (Signed)
PROGRESS NOTE    Kelly Maxwell  VCB:[id number] DOB: 11/01/1945 DOA: 10/24/2017 PCP: Wenda Low, MD   Brief Narrative:  HPI on 10/24/2017 by Dr. Mitzi Hansen Kelly Maxwell is a 72 y.o. 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  Per discussion with family at the bedside, she has been refusing to eat or drink for the past 2 days, was seen by home health today, suspected to have thrush, and was started on nystatin which she did take a dose of at home.  She has had recent admissions with AMS that was felt to be secondary, at least in part, to dehydration.  She is followed by palliative care, remains full code, but family has indicated that they are in favor of transition to hospice if she is unable to continue on chemotherapy.  Patient does not express any specific complaints, but family reports that she seems to be in pain when she tries to drink or eat recently. SNF was recommended at time of recent hospital discharge but family elected to take her home. She has had RLE swelling that was evaluated with negative venous US (she also had CTA chest negative for PE) during the admission earlier this month.  Interim history Patient admitted for dehydration and altered mental status.  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  Palliative care consulted and appreciated.  Assessment & Plan   Dehydration with acute encephalopathy -Patient presents with poor oral intake for the last several days -Possibly secondary to thrush and mouth  sores -Continue IV fluids, nystatin swish and spit, mouthwash, viscous lidocaine PRN -Currently AAOx1 (self) -will add on diflucan   Odynophagia -Suspect patient has possible thrush and mouth sores as listed above -Continue treatment and plan as above -Patient continues to have poor oral intake and is requiring IV fluids with D5 half-normal  Gliosarcoma -Status post radiation in 2013, currently with progression on chemotherapy -Followed by neuro-oncology Dr. Mickeal Skinner -Discussed with Dr. Mickeal Skinner, will see the patient in consult -Continue Decadron, Vimpat, Adderall for associated fatigue  Thrombocytopenia -Suspect secondary to chemotherapy -Currently no active bleeding -Platelets currently 57 -Continue to monitor CBC  Essential hypertension -Continue amlodipine  Diabetes mellitus, type II -Metformin held (patient's daughter states she does not have diabetes- metformin is used for the gliosarcoma and would like to have her sugars checked once or twice a day)  Chronic Normocytic anemia -hemoglobin currently 9.3, baseline approximately 10-11 -Currently on IV fluids, suspect some dilutional component -Monitor CBC  Hypokalemia -K 3.4  -will replace and continue to monitor BMP  Goals of care -Discussed with Dr. Mickeal Skinner, feels that patient has poor quality of life and functional status, no further recommendations as far as treatment. Feels patient should proceed with hospice however daughter declines but is open to palliative care consult. -Palliative care consulted and appreciated -Dr. Rowe Pavy and I had a long conversation with the patient's daughter, husband. Expressed that PICC line or PEG tube carry risk of infection. Daughter and husband feel that patient gets dehydrated and just need fluids and her mental status improves.  Daughter states that patient has  will to live and this was discussed with the patient yesterday evening. -IV team consulted at the request of family to discuss  risks/benefits of PICC -Daughter stated that Dr. Mickeal Skinner agreed to a PICC. I have called and discussed this with Dr. Mickeal Skinner and he does not feel that patient should have a PICC unless it is for more palliative care. He states he feels that the patient may need PRN hydration at home.  -case management consulted   DVT Prophylaxis  SCDs  Code Status: Full  Family Communication: None at bedside. Husband and daughter via phone.  Disposition Plan: Disposition pending.  PT recommending SNF, questions family's intentions rating SNF versus home.  Consultants Neuro-Oncology Palliative care Palliative Care  Procedures  None  Antibiotics   Anti-infectives (From admission, onward)   None      Subjective:   Kelly Maxwell seen and examined today.  Currently only alert to self.   Objective:   Vitals:   10/28/17 0613 10/28/17 1356 10/28/17 2057 10/29/17 0655  BP: 139/78 134/73 128/82 132/70  Pulse: 85 70 66 67  Resp: 14 18 20 16   Temp: 97.9 F (36.6 C) 98.4 F (36.9 C) 99.3 F (37.4 C) 98.3 F (36.8 C)  TempSrc: Oral Oral Oral Oral  SpO2: 97% 97% 98% 95%  Weight: 65.8 kg   61.5 kg  Height:        Intake/Output Summary (Last 24 hours) at 10/29/2017 1311 Last data filed at 10/29/2017 0738 Gross per 24 hour  Intake 1893.83 ml  Output 850 ml  Net 1043.83 ml   Filed Weights   10/27/17 0530 10/28/17 0613 10/29/17 0655  Weight: 65.8 kg 65.8 kg 61.5 kg   Exam  General: Well developed, chronically ill appearing, NAD  HEENT: NCAT, mucous membranes moist.   Neck: Supple  Cardiovascular: S1 S2 auscultated, RRR, no murmurs  Respiratory: Clear to auscultation bilaterally with equal chest rise  Abdomen: Soft, nontender, nondistended, + bowel sounds  Extremities: warm dry without cyanosis clubbing or edema  Neuro: AAOx1, nonfocal   Data Reviewed: I have personally reviewed following labs and imaging studies  CBC: Recent Labs  Lab 10/24/17 2116  10/25/17 0354  10/26/17 0401 10/27/17 0509 10/28/17 0455 10/29/17 0324  WBC 4.9  --  3.8* 2.9* 3.2* 3.2* 2.9*  NEUTROABS 4.4  --  3.1  --   --   --   --   HGB 11.1*   < > 9.9* 9.3* 9.8* 9.3* 9.3*  HCT 35.3*   < > 31.3* 29.5* 30.4* 29.8* 29.2*  MCV 95.4  --  95.4 96.1 95.0 95.8 95.4  PLT 51*  --  38* 29* 30* 42* 57*   < > = values in this interval not displayed.   Basic Metabolic Panel: Recent Labs  Lab 10/25/17 0354 10/26/17 0401 10/27/17 0509 10/28/17 0455 10/29/17 0324  NA 142 143 141 143 142  K 3.7 3.2* 3.5 3.5 3.4*  CL 115* 115* 115* 115* 113*  CO2 20* 23 21* 23 22  GLUCOSE 74 97 97 118* 99  BUN 10 5* 5* 10 8  CREATININE 0.31* 0.35* <0.30* <0.30* <0.30*  CALCIUM 8.1* 8.2* 8.3* 8.5* 8.7*   GFR: CrCl cannot be calculated (This lab value cannot be used to calculate CrCl because it is not a number: <0.30). Liver Function Tests: No results for input(s): AST, ALT, ALKPHOS, BILITOT, PROT, ALBUMIN in the last 168 hours. No results for input(s): LIPASE, AMYLASE in the last 168 hours. No results for input(s):  AMMONIA in the last 168 hours. Coagulation Profile: No results for input(s): INR, PROTIME in the last 168 hours. Cardiac Enzymes: No results for input(s): CKTOTAL, CKMB, CKMBINDEX, TROPONINI in the last 168 hours. BNP (last 3 results) No results for input(s): PROBNP in the last 8760 hours. HbA1C: No results for input(s): HGBA1C in the last 72 hours. CBG: Recent Labs  Lab 10/26/17 0736 10/26/17 1140 10/26/17 1624 10/26/17 2137 10/26/17 2343  GLUCAP 88 103* 113* 112* 113*   Lipid Profile: No results for input(s): CHOL, HDL, LDLCALC, TRIG, CHOLHDL, LDLDIRECT in the last 72 hours. Thyroid Function Tests: No results for input(s): TSH, T4TOTAL, FREET4, T3FREE, THYROIDAB in the last 72 hours. Anemia Panel: Recent Labs    10/28/17 0455  VITAMINB12 330   Urine analysis:    Component Value Date/Time   COLORURINE YELLOW 10/08/2017 1931   APPEARANCEUR CLEAR 10/08/2017 1931    LABSPEC 1.016 10/08/2017 1931   PHURINE 6.0 10/08/2017 1931   GLUCOSEU NEGATIVE 10/08/2017 1931   HGBUR NEGATIVE 10/08/2017 1931   BILIRUBINUR NEGATIVE 10/08/2017 1931   KETONESUR 5 (A) 10/08/2017 1931   PROTEINUR NEGATIVE 10/08/2017 1931   NITRITE NEGATIVE 10/08/2017 1931   LEUKOCYTESUR NEGATIVE 10/08/2017 1931   Sepsis Labs: @LABRCNTIP (procalcitonin:4,lacticidven:4)  )No results found for this or any previous visit (from the past 240 hour(s)).    Radiology Studies: No results found.   Scheduled Meds: . amLODipine  5 mg Oral Daily  . amphetamine-dextroamphetamine  30 mg Oral Daily  . atorvastatin  40 mg Oral q1800  . bisacodyl  5 mg Oral Daily  . cholecalciferol  1,000 Units Oral Daily  . dexamethasone  2 mg Oral Daily  . Difluprednate  1 drop Left Eye BID  . feeding supplement (ENSURE ENLIVE)  237 mL Oral BID BM  . insulin aspart  0-9 Units Subcutaneous TID WC  . ketorolac  1 drop Left Eye Daily  . lacosamide  50 mg Oral BID  . mouth rinse  15 mL Mouth Rinse BID  . Melatonin  10-20 mg Oral QHS  . nystatin  5 mL Oral QID  . pantoprazole  40 mg Oral Daily   Continuous Infusions: . dextrose 5 % and 0.45% NaCl 75 mL/hr at 10/29/17 0423     LOS: 1 day   Time Spent in minutes   30 minutes   Dionicia Cerritos D.O. on 10/29/2017 at 1:11 PM  Between 7am to 7pm - Please see pager noted on [email or web address]  After 7pm go to [email or web address]  And look for the night coverage person covering for me after hours  [facility] Office  [phone]

## 2017-10-30 DIAGNOSIS — F419 Anxiety disorder, unspecified: Secondary | ICD-10-CM

## 2017-10-30 DIAGNOSIS — E118 Type 2 diabetes mellitus with unspecified complications: Secondary | ICD-10-CM

## 2017-10-30 DIAGNOSIS — I10 Essential (primary) hypertension: Secondary | ICD-10-CM

## 2017-10-30 DIAGNOSIS — E860 Dehydration: Principal | ICD-10-CM

## 2017-10-30 DIAGNOSIS — R531 Weakness: Secondary | ICD-10-CM

## 2017-10-30 DIAGNOSIS — F329 Major depressive disorder, single episode, unspecified: Secondary | ICD-10-CM

## 2017-10-30 DIAGNOSIS — R1310 Dysphagia, unspecified: Secondary | ICD-10-CM

## 2017-10-30 DIAGNOSIS — C719 Malignant neoplasm of brain, unspecified: Secondary | ICD-10-CM

## 2017-10-30 DIAGNOSIS — D696 Thrombocytopenia, unspecified: Secondary | ICD-10-CM

## 2017-10-30 LAB — BASIC METABOLIC PANEL
Anion gap: 6 (ref 5–15)
BUN: 8 mg/dL (ref 8–23)
CALCIUM: 8.8 mg/dL — AB (ref 8.9–10.3)
CO2: 23 mmol/L (ref 22–32)
CREATININE: 0.32 mg/dL — AB (ref 0.44–1.00)
Chloride: 113 mmol/L — ABNORMAL HIGH (ref 98–111)
GFR calc Af Amer: >60 mL/min (ref >60)
GFR calc non Af Amer: >60 mL/min (ref >60)
Glucose, Bld: 93 mg/dL (ref 70–99)
Potassium: 3.7 mmol/L (ref 3.5–5.1)
SODIUM: 142 mmol/L (ref 135–145)

## 2017-10-30 LAB — URINALYSIS, ROUTINE W REFLEX MICROSCOPIC
Bilirubin Urine: NEGATIVE
Glucose, UA: NEGATIVE mg/dL
HGB URINE DIPSTICK: NEGATIVE
Ketones, ur: NEGATIVE mg/dL
Nitrite: NEGATIVE
Protein, ur: NEGATIVE mg/dL
SPECIFIC GRAVITY, URINE: 1.008 (ref 1.005–1.030)
WBC, UA: >50 WBC/hpf — ABNORMAL HIGH (ref 0–5)
pH: 8 (ref 5.0–8.0)

## 2017-10-30 LAB — CBC
HCT: 30.4 % — ABNORMAL LOW (ref 36.0–46.0)
HEMOGLOBIN: 9.8 g/dL — AB (ref 12.0–15.0)
MCH: 30.1 pg (ref 26.0–34.0)
MCHC: 32.2 g/dL (ref 30.0–36.0)
MCV: 93.3 fL (ref 78.0–100.0)
PLATELETS: 81 10*3/uL — AB (ref 150–400)
RBC: 3.26 MIL/uL — ABNORMAL LOW (ref 3.87–5.11)
RDW: 20 % — AB (ref 11.5–15.5)
WBC: 2.4 10*3/uL — ABNORMAL LOW (ref 4.0–10.5)

## 2017-10-30 LAB — MAGNESIUM: MAGNESIUM: 1.9 mg/dL (ref 1.7–2.4)

## 2017-10-30 LAB — VITAMIN B1: VITAMIN B1 (THIAMINE): 91.2 nmol/L (ref 66.5–200.0)

## 2017-10-30 NOTE — Progress Notes (Signed)
Spoke to [employer] re: PICC placement, per Garment/textile technologist will consult with patient husband regarding infusion therapy.  If infusion not needed, PICC will not need to be placed. Will follow up.

## 2017-10-30 NOTE — Progress Notes (Signed)
Discussed disposition plan again today with daughter and husband. Prefer still to have pt return home rather than pursue SNF any further, stating they feel pt may have better experience in familiar environment.   Updated attending on plan which remains to have pt return home with family, aides, and enlist [facility] for home health needs.  Sharren Bridge, MSW, LCSW Clinical Social Work 10/30/2017 [phone]

## 2017-10-30 NOTE — Progress Notes (Signed)
Physical Therapy Treatment Patient Details Name: Kelly Maxwell MRN: [id number] DOB: 1945-06-02 Today's Date: 10/30/2017    History of Present Illness 72 y.o. female with medical history significant for hypertension, type 2 diabetes mellitus, and gliosarcoma status post resection and radiation in 2013, currently on chemotherapy for progression of disease, and now presenting to the ED for evaluation of dehydration after 2 days of refusal to eat or drink    PT Comments    The patient is awake, minimal communication. Requires multimodal cue for mobility.  Assisted to bed edge and stood x 3 at the [place]. Spouse present and provided the name of device as it may be beneficial for use in home. Continue PT   Follow Up Recommendations  Home health PT;Supervision/Assistance - 24 hour(per notes)     Equipment Recommendations  (a STEDy may benefit. )    Recommendations for Other Services       Precautions / Restrictions Precautions Precautions: Fall Precaution Comments: plts low, incontinence, confusion    Mobility  Bed Mobility   Bed Mobility: Supine to Sit;Sit to Sidelying     Supine to sit: Max assist   Sit to sidelying: Max assist General bed mobility comments: max A to raise trunk, mod A to advance LEs to edge of bed, assit for legs and trunk back to sidelying, patient somewhat "floppy" tone  Transfers Overall transfer level: Needs assistance   Transfers: Sit to/from Stand Sit to Stand: Mod assist;+2 safety/equipment;From elevated surface         General transfer comment: assisted to stand from bed with multimodal cues to hold bar and stand up, steady assist to rise.  Stood x 3 at [employer]. At times lets go of bar. returned to bed for PICC  Ambulation/Gait                 Stairs             Wheelchair Mobility    Modified Rankin (Stroke Patients Only)       Balance Overall balance assessment: Needs assistance Sitting-balance support: Feet  supported;Bilateral upper extremity supported Sitting balance-Leahy Scale: Poor     Standing balance support: Bilateral upper extremity supported;During functional activity Standing balance-Leahy Scale: Poor Standing balance comment: able to stand in stedy without knees being blocked with min assist                            Cognition Arousal/Alertness: Awake/alert Behavior During Therapy: Flat affect Overall Cognitive Status: History of cognitive impairments - at baseline                                 General Comments: delayed processing and initiation; decreased memory; difficulty with some one step commands, pt can state her name but not birthdate, spouse reports cognition fluctuates at baseline      Exercises      General Comments        Pertinent Vitals/Pain Faces Pain Scale: Hurts little more Pain Location: mouth Pain Descriptors / Indicators: Discomfort Pain Intervention(s): Monitored during session    Home Living                      Prior Function            PT Goals (current goals can now be found in the care plan section) Progress towards PT goals:  Progressing toward goals    Frequency    Min 2X/week      PT Plan Current plan remains appropriate;Discharge plan needs to be updated    Co-evaluation              AM-PAC PT "6 Clicks" Daily Activity  Outcome Measure  Difficulty turning over in bed (including adjusting bedclothes, sheets and blankets)?: Unable Difficulty moving from lying on back to sitting on the side of the bed? : Unable Difficulty sitting down on and standing up from a chair with arms (e.g., wheelchair, bedside commode, etc,.)?: Unable Help needed moving to and from a bed to chair (including a wheelchair)?: Total Help needed walking in hospital room?: Total Help needed climbing 3-5 steps with a railing? : Total 6 Click Score: 6    End of Session   Activity Tolerance: Patient limited by  fatigue Patient left: in bed;with call bell/phone within reach;with bed alarm set;with family/visitor present Nurse Communication: Mobility status PT Visit Diagnosis: Other abnormalities of gait and mobility (R26.89);Muscle weakness (generalized) (M62.81);Adult, failure to thrive (R62.7)     Time: 6151-8343 PT Time Calculation (min) (ACUTE ONLY): 30 min  Charges:  $Therapeutic Activity: 23-37 mins                     [facility] PT [phone]    Claretha Cooper 10/30/2017, 3:24 PM

## 2017-10-30 NOTE — Progress Notes (Signed)
PROGRESS NOTE    Kelly Maxwell  OHY:[id number] DOB: Jun 28, 1945 DOA: 10/24/2017 PCP: Wenda Low, MD   Brief Narrative:  HPI on 10/24/2017 by Dr. 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 Per discussion with family at the bedside, she has been refusing to eat or drink for the past 2 days, was seen by home health today, suspected to have thrush, and was started on nystatin which she did take a dose of at home. She has had recent admissions with AMS that was felt to be secondary, at least in part, to dehydration. She is followed by palliative care, remains full code, but family has indicated that they are in favor of transition to hospice if she is unable to continue on chemotherapy. Patient does not express any specific complaints, but family reports that she seems to be in pain when she tries to drink or eat recently. SNF was recommended at time of recent hospital discharge but family elected to take her home. She has had RLE swelling that was evaluated with negative venous US (she also had CTA chest negative for PE) during the admission earlier this month.  **Patient was admitted for dehydration and AMS and currently being rehydrated. 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  Anxiety and depression   HTN (hypertension)   Weakness generalized   DM2 (diabetes mellitus, type 2) (HCC)   Dysphagia   Thrombocytopenia (HCC)  Dehydration with acute encephalopathy, slightly improved -Patient presented with poor oral intake for the last several days -Possibly secondary to thrush and mouth sores -Continue IV fluids with D5W 1/2 NS at 75 mL/rh, nystatin swish and spit, mouthwash, viscous lidocaine PRN -Currently AAOx1 (self) -Added on Diflucan  -PICC to be placed for Home IV Infusion per Neuro-Oncology  Odynophagia -Suspect patient has possible thrush and mouth sores as listed above -Continue treatment and plan as above -Patient continues to have poor oral intake and is requiring IV fluids with D5 half-normal as above  Gliosarcoma -Status post radiation in 2013, currently with progression on chemotherapy -Followed by neuro-oncology Dr. Mickeal Skinner -Discussed with Dr. Mickeal Skinner, will see the patient in consult -Continue Decadron, Vimpat, Adderall for associated fatigue -Dr. Mickeal Skinner feels as if this is End of Life Care and recommending PICC to avoid further dehydration   Thrombocytopenia -Suspect secondary to chemotherapy -Currently no active bleeding -Platelets currently is now 15 -Continue to monitor CBC  Essential hypertension -Continue Amlodipine 5 mg po Daily   Diabetes Mellitus, type II -Metformin held (patient's daughter states she does not have diabetes- metformin is used for the gliosarcoma and would like to have her sugars checked once or twice a day) -  Chronic Normocytic Anemia -Hb/Hct is now 9.8/30.4, baseline approximately 10-11 -Currently on IV fluids, suspect some dilutional component and drop  -Monitor CBC  Hypokalemia -K+ now improved to  3.7 -Replete as Necessary and continue to monitor BMP  Goals of care -Discussed with Dr. Mickeal Skinner, feels that patient has poor quality of life and functional status, no further recommendations as far as  treatment and feels she can be D/C'd after PII. Feels patient should proceed with hospice however daughter declines but is open to palliative care consult. -Palliative care consulted and appreciated and re-inforcing PICC -Daughter and husband feel that patient gets dehydrated and just need fluids and her mental status improves.  Daughter states that patient has will to live and this was discussed with the patient yesterday evening. -IV team consulted at the request of family to discuss risks/benefits of PICC -Daughter stated that Dr. Mickeal Skinner agreed to a PICC. I have called and discussed this with Dr. Mickeal Skinner and he does not feel that patient should have a PICC unless it is for more palliative care. He states he feels that the patient may need PRN hydration at home and will try to given her IVF Hydration at home in avoidance of readmiting to the Hospital   -Case management consulted and will go home with Home Health PT/OT/RN  DVT prophylaxis: SCDs given Thrombocytopenia  Code Status: FULL CODE Family Communication: Discussed with Daughter at bedside  Disposition Plan: Anticipate D/C in the next 24 hours if medically stable and if PICC is Placed   Consultants:   Neuro-Oncology Dr. Mickeal Skinner  Palliative Care    Procedures:    Antimicrobials:  Anti-infectives (From admission, onward)   Start     Dose/Rate Route Frequency Ordered Stop   10/29/17 1500  fluconazole (DIFLUCAN) 40 MG/ML suspension 100 mg     100 mg Oral Daily 10/29/17 1319       Subjective: Was complained of mouth pain.  No chest pain, shortness breath, lightheadedness or dizziness.  No other concerns or complaints at this time however she was still slightly confused.  Daughter feels like pick is best option and still wants to take the patient home.  Objective: Vitals:   10/29/17 2137 10/30/17 0509 10/30/17 1203 10/30/17 1351  BP: 124/62 122/76  127/81  Pulse: 73 61  79  Resp: 16 15  17   Temp: 99.6 F (37.6 C) 98.6 F (37  C)  97.6 F (36.4 C)  TempSrc: Oral Oral  Oral  SpO2: 97% 96% 98% 94%  Weight:  65.9 kg    Height:        Intake/Output Summary (Last 24 hours) at 10/30/2017 1456 Last data filed at 10/30/2017 0831 Gross per 24 hour  Intake 1981.6 ml  Output 1550 ml  Net 431.6 ml   Filed Weights   10/28/17 0613 10/29/17 0655 10/30/17 0509  Weight: 65.8 kg 61.5 kg 65.9 kg   Examination: Physical Exam:  Constitutional: Thin chronically ill appearing Caucasian female in NAD and appears calm but uncomfortable Eyes: Lids and conjunctivae normal, sclerae anicteric  ENMT: External Ears, Nose appear normal. Grossly normal hearing.  Neck: Appears normal, supple, no cervical masses, normal ROM, no appreciable thyromegaly, no JVD Respiratory: Diminished to auscultation bilaterally, no wheezing, rales, rhonchi or crackles. Normal respiratory effort and patient is not tachypenic. No accessory muscle use.  Cardiovascular: RRR, no murmurs / rubs / gallops.  Abdomen: Soft, non-tender, non-distended. No masses palpated. No appreciable hepatosplenomegaly. Bowel sounds positive x4.  GU: Deferred. Musculoskeletal: No clubbing / cyanosis of digits/nails. No joint deformity upper and lower extremities.  Skin: No rashes, lesions, ulcers on a limited skin evaluation. No induration; Warm and dry.  Neurologic:  CN 2-12 grossly intact with no focal deficits.Romberg sign and cerebellar reflexes not assessed.  Psychiatric: Impaired judgment and insight. Alert and oriented x 1. Normal mood and appropriate affect.   Data Reviewed: I have personally reviewed following labs and imaging studies  CBC: Recent Labs  Lab 10/24/17 2116  10/25/17 0354 10/26/17 0401 10/27/17 0509 10/28/17 0455 10/29/17 0324 10/30/17 0345  WBC 4.9  --  3.8* 2.9* 3.2* 3.2* 2.9* 2.4*  NEUTROABS 4.4  --  3.1  --   --   --   --   --   HGB 11.1*   < > 9.9* 9.3* 9.8* 9.3* 9.3* 9.8*  HCT 35.3*   < > 31.3* 29.5* 30.4* 29.8* 29.2* 30.4*  MCV 95.4  --   95.4 96.1 95.0 95.8 95.4 93.3  PLT 51*  --  38* 29* 30* 42* 57* 81*   < > = values in this interval not displayed.   Basic Metabolic Panel: Recent Labs  Lab 10/26/17 0401 10/27/17 0509 10/28/17 0455 10/29/17 0324 10/30/17 0345  NA 143 141 143 142 142  K 3.2* 3.5 3.5 3.4* 3.7  CL 115* 115* 115* 113* 113*  CO2 23 21* 23 22 23   GLUCOSE 97 97 118* 99 93  BUN 5* 5* 10 8 8   CREATININE 0.35* <0.30* <0.30* <0.30* 0.32*  CALCIUM 8.2* 8.3* 8.5* 8.7* 8.8*  MG  --   --   --   --  1.9   GFR: Estimated Creatinine Clearance: 58.5 mL/min (A) (by C-G formula based on SCr of 0.32 mg/dL (L)). Liver Function Tests: No results for input(s): AST, ALT, ALKPHOS, BILITOT, PROT, ALBUMIN in the last 168 hours. No results for input(s): LIPASE, AMYLASE in the last 168 hours. No results for input(s): AMMONIA in the last 168 hours. Coagulation Profile: No results for input(s): INR, PROTIME in the last 168 hours. Cardiac Enzymes: No results for input(s): CKTOTAL, CKMB, CKMBINDEX, TROPONINI in the last 168 hours. BNP (last 3 results) No results for input(s): PROBNP in the last 8760 hours. HbA1C: No results for input(s): HGBA1C in the last 72 hours. CBG: Recent Labs  Lab 10/26/17 0736 10/26/17 1140 10/26/17 1624 10/26/17 2137 10/26/17 2343  GLUCAP 88 103* 113* 112* 113*   Lipid Profile: No results for input(s): CHOL, HDL, LDLCALC, TRIG, CHOLHDL, LDLDIRECT in the last 72 hours. Thyroid Function Tests: No results for input(s): TSH, T4TOTAL, FREET4, T3FREE, THYROIDAB in the last 72 hours. Anemia Panel: Recent Labs    10/28/17 0455  VITAMINB12 330   Sepsis Labs: No results for input(s): PROCALCITON, LATICACIDVEN in the last 168 hours.  No results found for this or any previous visit (from the past 240 hour(s)).   Radiology Studies: [place] Ekg Site Rite  Result Date: 10/29/2017 If [facility] image not attached, placement could not be confirmed due to current cardiac rhythm.  Scheduled Meds: .  amLODipine  5 mg Oral Daily  . amphetamine-dextroamphetamine  30 mg Oral Daily  . atorvastatin  40 mg Oral q1800  . bisacodyl  5 mg Oral Daily  . cholecalciferol  1,000 Units Oral Daily  . dexamethasone  2 mg Oral Daily  . Difluprednate  1 drop Left Eye BID  . feeding supplement (ENSURE ENLIVE)  237 mL Oral BID BM  . fluconazole  100 mg Oral Daily  . insulin aspart  0-9 Units Subcutaneous TID WC  . ketorolac  1 drop Left Eye Daily  . lacosamide  50 mg Oral BID  . mouth rinse  15 mL Mouth Rinse BID  . Melatonin  10-20 mg Oral QHS  . nystatin  5 mL Oral QID  . pantoprazole  40 mg Oral Daily  . senna-docusate  1 tablet Oral BID   Continuous Infusions: . dextrose 5 % and 0.45% NaCl 75 mL/hr at 10/30/17 1341    LOS: 2 days   Kerney Elbe, DO [facility] PAGER is on AMION  If 7PM-7AM, please contact night-coverage [email or web address] Password [facility] 10/30/2017, 2:56 PM

## 2017-10-30 NOTE — Progress Notes (Signed)
Spoke with patient/daughter about placing PICC tonight. Daughter states the patient would be more comfortable with her husband present for procedure and he returns in the morning. Primary RN notified. Follow up in AM regarding placement.

## 2017-10-31 ENCOUNTER — Other Ambulatory Visit: Payer: Self-pay | Admitting: *Deleted

## 2017-10-31 LAB — CBC WITH DIFFERENTIAL/PLATELET
BASOS PCT: 0 %
Basophils Absolute: 0 10*3/uL (ref 0.0–0.1)
EOS ABS: 0.1 10*3/uL (ref 0.0–0.7)
EOS PCT: 4 %
HEMATOCRIT: 31.6 % — AB (ref 36.0–46.0)
HEMOGLOBIN: 10.2 g/dL — AB (ref 12.0–15.0)
LYMPHS ABS: 0.5 10*3/uL — AB (ref 0.7–4.0)
Lymphocytes Relative: 21 %
MCH: 30.1 pg (ref 26.0–34.0)
MCHC: 32.3 g/dL (ref 30.0–36.0)
MCV: 93.2 fL (ref 78.0–100.0)
MONOS PCT: 4 %
Monocytes Absolute: 0.1 10*3/uL (ref 0.1–1.0)
Neutro Abs: 1.6 10*3/uL — ABNORMAL LOW (ref 1.7–7.7)
Neutrophils Relative %: 71 %
Platelets: 106 10*3/uL — ABNORMAL LOW (ref 150–400)
RBC: 3.39 MIL/uL — AB (ref 3.87–5.11)
RDW: 20.3 % — ABNORMAL HIGH (ref 11.5–15.5)
WBC: 2.3 10*3/uL — ABNORMAL LOW (ref 4.0–10.5)

## 2017-10-31 LAB — COMPREHENSIVE METABOLIC PANEL
ALBUMIN: 2.9 g/dL — AB (ref 3.5–5.0)
ALT: 19 U/L (ref 0–44)
AST: 16 U/L (ref 15–41)
Alkaline Phosphatase: 51 U/L (ref 38–126)
Anion gap: 9 (ref 5–15)
BILIRUBIN TOTAL: 0.7 mg/dL (ref 0.3–1.2)
BUN: 10 mg/dL (ref 8–23)
CO2: 25 mmol/L (ref 22–32)
CREATININE: 0.32 mg/dL — AB (ref 0.44–1.00)
Calcium: 8.9 mg/dL (ref 8.9–10.3)
Chloride: 110 mmol/L (ref 98–111)
GFR calc Af Amer: >60 mL/min (ref >60)
GFR calc non Af Amer: >60 mL/min (ref >60)
GLUCOSE: 90 mg/dL (ref 70–99)
POTASSIUM: 3.7 mmol/L (ref 3.5–5.1)
Sodium: 144 mmol/L (ref 135–145)
TOTAL PROTEIN: 5.4 g/dL — AB (ref 6.5–8.1)

## 2017-10-31 LAB — GLUCOSE, CAPILLARY: Glucose-Capillary: 81 mg/dL (ref 70–99)

## 2017-10-31 LAB — MAGNESIUM: Magnesium: 2 mg/dL (ref 1.7–2.4)

## 2017-10-31 LAB — PHOSPHORUS: Phosphorus: 3.5 mg/dL (ref 2.5–4.6)

## 2017-10-31 MED ORDER — SODIUM CHLORIDE 0.9% FLUSH: 10.0000 mL | INTRAVENOUS | Status: DC | PRN

## 2017-10-31 MED ORDER — MAGIC MOUTHWASH W/LIDOCAINE: 5.0000 mL | Freq: Four times a day (QID) | ORAL | 0 refills | Status: DC | PRN

## 2017-10-31 MED ORDER — FLUCONAZOLE 40 MG/ML PO SUSR: 100.0000 mg | Freq: Every day | ORAL | 0 refills | Status: DC

## 2017-10-31 MED ORDER — ENSURE ENLIVE PO LIQD: 237.0000 mL | Freq: Two times a day (BID) | ORAL | 12 refills | Status: DC

## 2017-10-31 MED ORDER — SODIUM CHLORIDE 0.9% FLUSH: 10.0000 mL | Freq: Two times a day (BID) | INTRAVENOUS | Status: DC

## 2017-10-31 NOTE — Progress Notes (Signed)
Peripherally Inserted Central Catheter/Midline Placement  The IV Nurse has discussed with the patient and/or persons authorized to consent for the patient, the purpose of this procedure and the potential benefits and risks involved with this procedure.  The benefits include less needle sticks, lab draws from the catheter, and the patient may be discharged home with the catheter. Risks include, but not limited to, infection, bleeding, blood clot (thrombus formation), and puncture of an artery; nerve damage and irregular heartbeat and possibility to perform a PICC exchange if needed/ordered by physician.  Alternatives to this procedure were also discussed.  B[employer] Power PICC patient education guide, fact sheet on infection prevention and patient information card has been provided to patient /or left at bedside.    PICC/Midline Placement Documentation  PICC Single Lumen 10/31/17 PICC Right Brachial 38 cm 0 cm (Active)  Indication for Insertion or Continuance of Line Home intravenous therapies (PICC only) 10/31/2017 11:33 AM  Exposed Catheter (cm) 0 cm 10/31/2017 11:33 AM  Site Assessment Clean;Dry;Intact 10/31/2017 11:33 AM  Line Status Flushed;Blood return noted 10/31/2017 11:33 AM  Dressing Type Transparent 10/31/2017 11:33 AM  Dressing Status Clean;Dry;Intact;Antimicrobial disc in place 10/31/2017 11:33 AM  Dressing Intervention New dressing 10/31/2017 11:33 AM  Dressing Change Due 11/07/17 10/31/2017 11:33 AM    Consent signed by husband at bedside   Synthia Innocent 10/31/2017, 11:33 AM

## 2017-10-31 NOTE — Discharge Summary (Addendum)
Physician Discharge Summary  Kelly Maxwell PYP:[id number] DOB: [birth year] DOA: 10/24/2017  PCP: Kelly Low, MD  Admit date: 10/24/2017 Discharge date: 10/31/2017  Admitted From: Home Disposition: Home with Home Health   Recommendations for Outpatient Follow-up:  1. Follow up with PCP in 1-2 weeks 2. Follow up with Neuro-Oncology Dr. Mickeal Maxwell within 1-2 weeks 3. Follow up with Palliative Care as an outpatient  4. Please obtain CMP/CBC, Mag, Phos in one week 5. Please follow up on the following pending results:  Home Health: Yes  Equipment/Devices: PICC Line   Discharge Condition: Stable but Guarded CODE STATUS: FULL CODE Diet recommendation: Dysphagia 3 Diet  Brief/Interim Summary: HPI on 10/24/2017 by Dr. 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 Per discussion with family at the bedside, she has been refusing to eat or drink for the past 2 days, was seen by home health today, suspected to have thrush, and was started on nystatin which she did take a dose of at home. She has had recent admissions with AMS that was felt to be secondary, at least in part, to dehydration. She is followed by palliative care, remains full code, but family has indicated that they are in favor of transition to hospice if she is unable to continue on chemotherapy. Patient does not express any specific complaints, but family reports that she seems to be in pain when she tries to drink or eat recently. SNF was  recommended at time of recent hospital discharge but family elected to take her home. She has had RLE swelling that was evaluated with negative venous US (she also had CTA chest negative for PE) during the admission earlier this month.  **Patient was admitted for dehydration and AMS and currently being rehydrated. Has Glioscarcoma followed by Neuro-Onc and Palliative consulted for [place]; After discussion with Palliative and Neuro-Oncology the plan was to place PICC and for patient to go home with Home Health. PICC was placed and patient deemed medically stable to D/C Home with [place] as she and her family refused SNF options.   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 Mickeal Maxwell to make Recommendations  Odynophagia -Suspect patient has possible thrush and mouth sores as listed above -Continue treatment and plan as above -Patient continues to have poor oral intake and is requiring IV fluids with D5 half-normal as above but will D/C at D/C as the fluid management will be handled by Neuro-Oncology  Gliosarcoma -Status post radiation in 2013, currently with progression on chemotherapy -Followed by neuro-oncology Dr. Mickeal Maxwell -Discussed with Dr. Mickeal Maxwell, will see the patient in consult -Continue Decadron, Vimpat, Adderall for  associated fatigue -Dr. Mickeal Maxwell feels as if this is End of Life Care and recommending PICC to avoid further dehydration and will make recommendations about Fluids to [facility] at D/C    Thrombocytopenia -Suspect secondary to chemotherapy -Currently no active bleeding -Platelets currently is now 106 -Continue to monitor CBC  Essential hypertension -Continue Amlodipine 5 mg po Daily   Diabetes Mellitus, type II -Metformin held (patient's daughter states she does not have diabetes- metformin is used for the gliosarcoma and would like to have her sugars checked once or twice a day) -Follow up with PCP   Chronic Normocytic Anemia -Hb/Hct is now 10.2/31.6, baseline approximately 10-11 -Currently on IV fluids, suspect edsome dilutional component and drop but Hb is stable  -Monitor CBC  Hypokalemia -K+ now improved to 3.7 -Replete as Necessary and continue to monitor BMP  Asymptomatic Bacteuria  -U/A showed the appearance with large leukocytes, few bacteria and greater than 50 WBCs however patient was asymptomatic so will not treat -Defer to PCP to treat if patient becomes symptomatic   Goals of care -Discussed with Dr. Mickeal Maxwell, feels that patient has poor quality of life and functional status, no further recommendations as far as treatment and feels she can be D/C'd after PII. Feels patient should proceed with hospice however daughter declines but is open to palliative care consult. -Palliative care consulted and appreciated and re-inforcing PICC -Daughter and husband feel that patient gets dehydrated and just need fluids and her mental status improves. Daughter states that patient has will to live and this was discussed with the patient yesterday evening. -IV team consulted at the request of family to discuss risks/benefits of PICC -Daughter stated that Dr. Mickeal Maxwell agreed to a PICC. I have called and discussed this with Dr. Mickeal Maxwell and he does not feel that patient should have a PICC unless  it is for more palliative care. He states he feels that the patient may need PRN hydration at home and will try to given her IVF Hydration at home in avoidance of readmiting to the Hospital   -Case management consulted and will go home with Home Health PT/OT/RN -Follow up with Palliative and Neuro-Oncology as an outpatient   Discharge Instructions Discharge Instructions    Call MD for:  difficulty breathing, headache or visual disturbances   Complete by:  As directed    Call MD for:  extreme fatigue   Complete by:  As directed    Call MD for:  hives   Complete by:  As directed    Call MD for:  persistant dizziness or light-headedness   Complete by:  As directed    Call MD for:  persistant nausea and vomiting   Complete by:  As directed    Call MD for:  redness, tenderness, or signs of infection (pain, swelling, redness, odor or green/yellow discharge around incision site)   Complete by:  As directed    Call MD for:  severe uncontrolled pain   Complete by:  As directed    Call MD for:  temperature >100.4   Complete by:  As directed    Diet general   Complete by:  As directed    Discharge instructions   Complete by:  As directed    You were cared for by a hospitalist during your hospital stay. If  you have any questions about your discharge medications or the care you received while you were in the hospital after you are discharged, you can call the unit and ask to speak with the hospitalist on call if the hospitalist that took care of you is not available. Once you are discharged, your primary care physician will handle any further medical issues. Please note that NO REFILLS for any discharge medications will be authorized once you are discharged, as it is imperative that you return to your primary care physician (or establish a relationship with a primary care physician if you do not have one) for your aftercare needs so that they can reassess your need for medications and monitor your lab  values.  Follow up with PCP and Neuro-Oncology along with Palliative Care. Take all medications as prescribed. 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     Compazine [prochlorperazine Edisylate] Other (See Comments), Rash   Became hyper Became hyper      Medication List    TAKE these medications   amLODipine 5 MG tablet Commonly known as:  NORVASC Take 1 tablet (5 mg total) by mouth daily.   amphetamine-dextroamphetamine 30 MG tablet Commonly known as:  ADDERALL Take 1 tablet by mouth daily.   atorvastatin 40 MG tablet Commonly known as:  LIPITOR Take 40 mg by mouth daily.   bisacodyl 5 MG EC tablet Commonly known as:  DULCOLAX Take 5 mg by mouth daily.   Boswellia Serrata Extract Powd Take 1 packet by mouth daily.   cholecalciferol 1000 units tablet Commonly known as:  VITAMIN D Take 1 tablet (1,000 Units total) by mouth daily.   clonazePAM 0.5 MG tablet Commonly known as:  KLONOPIN Take 1 tablet (0.5 mg total) by mouth at bedtime as needed for anxiety.   [employer] DIAPER 1-10 % Crea APPLY EXTERNALLY AS DIRECTED AS NEEDED WITH EACH DIAPHER CHANGE   dexamethasone 2 MG tablet Commonly known as:  DECADRON Take 1 tablet (2 mg total) by mouth daily.   DUREZOL 0.05 % Emul Generic drug:  Difluprednate Place 1 drop into the left eye 2 (two) times daily.   feeding supplement (ENSURE ENLIVE) Liqd Take 237 mLs by mouth 2 (two) times daily between meals.   fluconazole 40 MG/ML suspension Commonly known as:  DIFLUCAN Take 2.5 mLs (100 mg total) by mouth daily. Start taking on:  11/01/2017   GLEOSTINE 10 MG capsule Generic drug:  lomustine Take 10 mg by mouth  daily.   GLEOSTINE 100 MG capsule Generic drug:  lomustine Take 100 mg by mouth as directed.   GLEOSTINE 40 MG capsule Generic drug:  lomustine Take 40 mg by mouth daily.   lacosamide 50 MG Tabs tablet Commonly known as:  VIMPAT Take 1 tablet (50 mg total) by mouth 2 (two) times daily.   magic mouthwash w/lidocaine Soln Take 5 mLs by mouth 4 (four) times daily as needed for mouth pain.   Melatonin 5 MG Caps Take 10-20 mg by mouth at bedtime.   metFORMIN 500 MG tablet Commonly known as:  GLUCOPHAGE Take 500 mg by mouth 2 (two) times daily with a meal.  NON FORMULARY Take 1 capsule by mouth 2 (two) times daily. Lazarus Natural CBD Oil   nystatin 100000 UNIT/ML suspension Commonly known as:  MYCOSTATIN Take 5 mLs by mouth 4 (four) times daily. Swish and spit out   nystatin cream Commonly known as:  MYCOSTATIN Apply 1 application topically as needed for dry skin.   ondansetron 8 MG tablet Commonly known as:  ZOFRAN Take 1 tablets (8mg ) by mouth once 30-60 prior to lomustine and Q 8h prn N/V   pantoprazole 40 MG tablet Commonly known as:  PROTONIX Take 1 tablet (40 mg total) by mouth daily.   PROLENSA 0.07 % Soln Generic drug:  Bromfenac Sodium Place 1 drop into the left eye daily. Reported on 09/02/2015   senna-docusate 8.6-50 MG tablet Commonly known as:  Senokot-S Take 1 tablet by mouth at bedtime as needed for mild constipation.      Follow-up Information    [facility].   Specialty:  Home Health Services Contact information: [place] [address] [phone]          Allergies  Allergen Reactions  . Ciprofloxacin     AMS  . Codeine Nausea And Vomiting and Nausea Only  . 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    . Compazine [Prochlorperazine Edisylate] Other (See Comments) and Rash    Became hyper Became hyper    Consultations:  Neuro-Oncology  Palliative Care Medicine  Procedures/Studies: Dg Chest 2 View  Result Date: 10/08/2017 CLINICAL DATA:  Acute onset of altered mental status. Dehydration. EXAM: CHEST - 2 VIEW COMPARISON:  Chest radiograph performed 07/16/2017 FINDINGS: The lungs are hypoexpanded. Mild bibasilar airspace opacities likely reflect atelectasis. No pleural effusion or pneumothorax is seen. The heart is normal in size. No acute osseous abnormalities are identified. IMPRESSION: Lungs hypoexpanded. Mild bibasilar airspace opacities likely reflect atelectasis. Electronically Signed   By: Garald Balding M.D.   On: 10/08/2017 22:20   Ct Head Wo Contrast  Result Date: 10/09/2017 CLINICAL DATA:  Altered mental status. Patient is currently being treated for a glial neoplasm with radiation therapy. EXAM: CT HEAD WITHOUT CONTRAST TECHNIQUE: Contiguous axial images were obtained from the base of the skull through the vertex without intravenous contrast. COMPARISON:  CT 07/16/2017 and MRI 08/27/2017 FINDINGS: Brain: Acute intracranial hemorrhage or large vascular territory infarct. Extensive white matter changes from known glial tumor involving the left frontal and temporal lobes as well as left caudate and basal ganglia appear stable. No hydrocephalus. No extra-axial fluid. Midline fourth ventricle and basal cisterns with patency and without effacement. No cerebellar lesion is identified. Brainstem unremarkable. Vascular: No hyperdense vessel sign. Carotid calcifications are noted bilaterally. Skull: Left temporoparietal craniotomy change. No acute fracture or suspicious osseous lesions. Sinuses/Orbits: Left lens replacement. No acute paranasal sinus disease. Other: None IMPRESSION: Extensive supratentorial white matter change, more so on the left involving the left frontal and temporal lobes as well as the left caudate and basal ganglia consistent with known glial tumor. No significant change  identified. Left-sided temporoparietal craniotomy. Electronically Signed   By: Ashley Royalty M.D.   On: 10/09/2017 00:06   Ct Angio Chest Pe W/cm &/or Wo Cm  Result Date: 10/09/2017 CLINICAL DATA:  Acute onset of altered mental status. Generalized abdominal pain. Patient on radiation therapy for glial neoplasm. EXAM: CT ANGIOGRAPHY CHEST CT ABDOMEN AND PELVIS WITH CONTRAST TECHNIQUE: Multidetector CT imaging of the chest was performed using the  standard protocol during bolus administration of intravenous contrast. Multiplanar CT image reconstructions and MIPs were obtained to evaluate the vascular anatomy. Multidetector CT imaging of the abdomen and pelvis was performed using the standard protocol during bolus administration of intravenous contrast. CONTRAST:  171mL ISOVUE-370 IOPAMIDOL (ISOVUE-370) INJECTION 76% COMPARISON:  CT of the abdomen and pelvis performed 09/02/2015, and CT of the chest performed 12/14/2011 FINDINGS: CTA CHEST FINDINGS Cardiovascular:  There is no evidence of pulmonary embolus. The heart is normal in size. The thoracic aorta is grossly unremarkable. The great vessels are within normal limits. Mediastinum/Nodes: The mediastinum is unremarkable in appearance. No mediastinal lymphadenopathy is seen. No pericardial effusion is identified. The left thyroid lobe is mildly heterogeneous but otherwise unremarkable. The right thyroid lobe is diminutive. No axillary lymphadenopathy is seen. A moderate hiatal hernia is noted. Lungs/Pleura: Bilateral atelectasis or scarring is noted. No pleural effusion or pneumothorax is seen. No masses are identified. Musculoskeletal: No acute osseous abnormalities are identified. The visualized musculature is unremarkable in appearance. Review of the MIP images confirms the above findings. CT ABDOMEN and PELVIS FINDINGS Hepatobiliary: The liver is unremarkable in appearance. The gallbladder is unremarkable in appearance. The common bile duct remains normal in  caliber. Pancreas: The pancreas is within normal limits. Spleen: A few small hypodensities within the spleen may reflect small cysts. Adrenals/Urinary Tract: The adrenal glands are unremarkable in appearance. A small right renal cyst is noted. There is no evidence of hydronephrosis. No renal or ureteral stones are identified. No perinephric stranding is appreciated. Stomach/Bowel: The stomach is unremarkable in appearance. The small bowel is within normal limits. The appendix is normal in caliber, without evidence of appendicitis. The colon is unremarkable in appearance. Mild wall thickening at the rectum could reflect mild chronic inflammation or proctitis. Vascular/Lymphatic: Scattered calcification is seen along the abdominal aorta and its branches. The abdominal aorta is otherwise grossly unremarkable. The inferior vena cava is grossly unremarkable. No retroperitoneal lymphadenopathy is seen. No pelvic sidewall lymphadenopathy is identified. Reproductive: The bladder is mildly distended and grossly unremarkable. The uterus is unremarkable in appearance. The ovaries are relatively symmetric. No suspicious adnexal masses are seen. Other: No additional soft tissue abnormalities are seen. Musculoskeletal: No acute osseous abnormalities are identified. Multilevel vacuum phenomenon is noted along the lower thoracic and lumbar spine. The visualized musculature is unremarkable in appearance. Review of the MIP images confirms the above findings. IMPRESSION: 1. No evidence of pulmonary embolus. 2. No acute abnormality seen to explain the patient's symptoms. 3. Mild wall thickening at the rectum could reflect mild chronic inflammation or possibly proctitis. 4. Moderate hiatal hernia noted. 5. Bilateral atelectasis or scarring noted. Lungs otherwise clear. 6. Small right renal cyst noted. Aortic Atherosclerosis (ICD10-I70.0). Electronically Signed   By: Garald Balding M.D.   On: 10/09/2017 00:07   Ct Abdomen Pelvis W  Contrast  Result Date: 10/09/2017 CLINICAL DATA:  Acute onset of altered mental status. Generalized abdominal pain. Patient on radiation therapy for glial neoplasm. EXAM: CT ANGIOGRAPHY CHEST CT ABDOMEN AND PELVIS WITH CONTRAST TECHNIQUE: Multidetector CT imaging of the chest was performed using the standard protocol during bolus administration of intravenous contrast. Multiplanar CT image reconstructions and MIPs were obtained to evaluate the vascular anatomy. Multidetector CT imaging of the abdomen and pelvis was performed using the standard protocol during bolus administration of intravenous contrast. CONTRAST:  110mL ISOVUE-370 IOPAMIDOL (ISOVUE-370) INJECTION 76% COMPARISON:  CT of the abdomen and pelvis performed 09/02/2015, and CT of the chest performed 12/14/2011 FINDINGS:  CTA CHEST FINDINGS Cardiovascular:  There is no evidence of pulmonary embolus. The heart is normal in size. The thoracic aorta is grossly unremarkable. The great vessels are within normal limits. Mediastinum/Nodes: The mediastinum is unremarkable in appearance. No mediastinal lymphadenopathy is seen. No pericardial effusion is identified. The left thyroid lobe is mildly heterogeneous but otherwise unremarkable. The right thyroid lobe is diminutive. No axillary lymphadenopathy is seen. A moderate hiatal hernia is noted. Lungs/Pleura: Bilateral atelectasis or scarring is noted. No pleural effusion or pneumothorax is seen. No masses are identified. Musculoskeletal: No acute osseous abnormalities are identified. The visualized musculature is unremarkable in appearance. Review of the MIP images confirms the above findings. CT ABDOMEN and PELVIS FINDINGS Hepatobiliary: The liver is unremarkable in appearance. The gallbladder is unremarkable in appearance. The common bile duct remains normal in caliber. Pancreas: The pancreas is within normal limits. Spleen: A few small hypodensities within the spleen may reflect small cysts. Adrenals/Urinary  Tract: The adrenal glands are unremarkable in appearance. A small right renal cyst is noted. There is no evidence of hydronephrosis. No renal or ureteral stones are identified. No perinephric stranding is appreciated. Stomach/Bowel: The stomach is unremarkable in appearance. The small bowel is within normal limits. The appendix is normal in caliber, without evidence of appendicitis. The colon is unremarkable in appearance. Mild wall thickening at the rectum could reflect mild chronic inflammation or proctitis. Vascular/Lymphatic: Scattered calcification is seen along the abdominal aorta and its branches. The abdominal aorta is otherwise grossly unremarkable. The inferior vena cava is grossly unremarkable. No retroperitoneal lymphadenopathy is seen. No pelvic sidewall lymphadenopathy is identified. Reproductive: The bladder is mildly distended and grossly unremarkable. The uterus is unremarkable in appearance. The ovaries are relatively symmetric. No suspicious adnexal masses are seen. Other: No additional soft tissue abnormalities are seen. Musculoskeletal: No acute osseous abnormalities are identified. Multilevel vacuum phenomenon is noted along the lower thoracic and lumbar spine. The visualized musculature is unremarkable in appearance. Review of the MIP images confirms the above findings. IMPRESSION: 1. No evidence of pulmonary embolus. 2. No acute abnormality seen to explain the patient's symptoms. 3. Mild wall thickening at the rectum could reflect mild chronic inflammation or possibly proctitis. 4. Moderate hiatal hernia noted. 5. Bilateral atelectasis or scarring noted. Lungs otherwise clear. 6. Small right renal cyst noted. Aortic Atherosclerosis (ICD10-I70.0). Electronically Signed   By: Garald Balding M.D.   On: 10/09/2017 00:07   [place] Ekg Site Rite  Result Date: 10/29/2017 If Site Rite image not attached, placement could not be confirmed due to current cardiac rhythm.  Subjective: At bedside and  states mouth was still hurting.  No chest pain, shortness breath, nausea, vomiting and husband feels that she is improved.  Patient denies any burning or discomfort in her urine.  No other concerns or complaints at this time and patient and family want to go home.  Patient to follow-up with Dr. Mickeal Maxwell closely in the outpatient setting.  Discharge Exam: Vitals:   10/31/17 0400 10/31/17 1230  BP: (!) 145/93 135/83  Pulse: 67 72  Resp: 16 16  Temp: 97.9 F (36.6 C) 98.1 F (36.7 C)  SpO2: 93% 99%   Vitals:   10/30/17 1351 10/30/17 2230 10/31/17 0400 10/31/17 1230  BP: 127/81 126/67 (!) 145/93 135/83  Pulse: 79 84 67 72  Resp: 17 16 16 16   Temp: 97.6 F (36.4 C) 97.9 F (36.6 C) 97.9 F (36.6 C) 98.1 F (36.7 C)  TempSrc: Oral Oral Oral Oral  SpO2:  94% 96% 93% 99%  Weight:   65.6 kg   Height:       General: Pt is a thin Caucasian female who is awake, not in acute distress Cardiovascular: RRR, S1/S2 +, no rubs, no gallops Respiratory: CTA bilaterally, no wheezing, no rhonchi Abdominal: Soft, NT, ND, bowel sounds + Extremities: no edema, no cyanosis  The results of significant diagnostics from this hospitalization (including imaging, microbiology, ancillary and laboratory) are listed below for reference.    Microbiology: No results found for this or any previous visit (from the past 240 hour(s)).   Labs: BNP (last 3 results) No results for input(s): BNP in the last 8760 hours. Basic Metabolic Panel: Recent Labs  Lab 10/27/17 0509 10/28/17 0455 10/29/17 0324 10/30/17 0345 10/31/17 0350  NA 141 143 142 142 144  K 3.5 3.5 3.4* 3.7 3.7  CL 115* 115* 113* 113* 110  CO2 21* 23 22 23 25   GLUCOSE 97 118* 99 93 90  BUN 5* 10 8 8 10   CREATININE <0.30* <0.30* <0.30* 0.32* 0.32*  CALCIUM 8.3* 8.5* 8.7* 8.8* 8.9  MG  --   --   --  1.9 2.0  PHOS  --   --   --   --  3.5   Liver Function Tests: Recent Labs  Lab 10/31/17 0350  AST 16  ALT 19  ALKPHOS 51  BILITOT 0.7   PROT 5.4*  ALBUMIN 2.9*   No results for input(s): LIPASE, AMYLASE in the last 168 hours. No results for input(s): AMMONIA in the last 168 hours. CBC: Recent Labs  Lab 10/24/17 2116  10/25/17 0354  10/27/17 0509 10/28/17 0455 10/29/17 0324 10/30/17 0345 10/31/17 0350  WBC 4.9  --  3.8*   < > 3.2* 3.2* 2.9* 2.4* 2.3*  NEUTROABS 4.4  --  3.1  --   --   --   --   --  1.6*  HGB 11.1*   < > 9.9*   < > 9.8* 9.3* 9.3* 9.8* 10.2*  HCT 35.3*   < > 31.3*   < > 30.4* 29.8* 29.2* 30.4* 31.6*  MCV 95.4  --  95.4   < > 95.0 95.8 95.4 93.3 93.2  PLT 51*  --  38*   < > 30* 42* 57* 81* 106*   < > = values in this interval not displayed.   Cardiac Enzymes: No results for input(s): CKTOTAL, CKMB, CKMBINDEX, TROPONINI in the last 168 hours. BNP: Invalid input(s): POCBNP CBG: Recent Labs  Lab 10/26/17 1140 10/26/17 1624 10/26/17 2137 10/26/17 2343 10/31/17 0759  GLUCAP 103* 113* 112* 113* 79   D-Dimer No results for input(s): DDIMER in the last 72 hours. Hgb A1c No results for input(s): HGBA1C in the last 72 hours. Lipid Profile No results for input(s): CHOL, HDL, LDLCALC, TRIG, CHOLHDL, LDLDIRECT in the last 72 hours. Thyroid function studies No results for input(s): TSH, T4TOTAL, T3FREE, THYROIDAB in the last 72 hours.  Invalid input(s): FREET3 Anemia work up No results for input(s): VITAMINB12, FOLATE, FERRITIN, TIBC, IRON, RETICCTPCT in the last 72 hours. Urinalysis    Component Value Date/Time   COLORURINE YELLOW 10/30/2017 0027   APPEARANCEUR HAZY (A) 10/30/2017 0027   LABSPEC 1.008 10/30/2017 0027   PHURINE 8.0 10/30/2017 0027   GLUCOSEU NEGATIVE 10/30/2017 0027   HGBUR NEGATIVE 10/30/2017 0027   BILIRUBINUR NEGATIVE 10/30/2017 0027   KETONESUR NEGATIVE 10/30/2017 0027   PROTEINUR NEGATIVE 10/30/2017 0027   NITRITE NEGATIVE 10/30/2017 0027   LEUKOCYTESUR  LARGE (A) 10/30/2017 0027   Sepsis Labs Invalid input(s): PROCALCITONIN,  WBC,  LACTICIDVEN Microbiology No  results found for this or any previous visit (from the past 240 hour(s)).  Time coordinating discharge: 35 minutes  SIGNED:  Kerney Elbe, DO [facility] 10/31/2017, 1:48 PM Pager is on [place]  If 7PM-7AM, please contact night-coverage [email or web address] Password [email or web address]

## 2017-10-31 NOTE — Care Management Important Message (Signed)
Important Message  Patient Details  Name: Kelly Maxwell MRN: [id number] Date of Birth: September 03, 1945   Medicare Important Message Given:  Yes    Kerin Salen 10/31/2017, 10:24 AMImportant Message  Patient Details  Name: Kelly Maxwell MRN: [id number] Date of Birth: 09-Sep-1945   Medicare Important Message Given:  Yes    Kerin Salen 10/31/2017, 10:24 AM

## 2017-10-31 NOTE — Progress Notes (Signed)
Went over discharge papers with patient and husband. They all verbalized understanding . Discharge papers handed over to the patient and husband.

## 2017-11-01 DIAGNOSIS — C712 Malignant neoplasm of temporal lobe: Secondary | ICD-10-CM | POA: Diagnosis not present

## 2017-11-01 DIAGNOSIS — E860 Dehydration: Secondary | ICD-10-CM | POA: Diagnosis not present

## 2017-11-01 DIAGNOSIS — R2689 Other abnormalities of gait and mobility: Secondary | ICD-10-CM | POA: Diagnosis not present

## 2017-11-05 ENCOUNTER — Other Ambulatory Visit: Payer: Self-pay | Admitting: *Deleted

## 2017-11-05 ENCOUNTER — Telehealth: Payer: Self-pay | Admitting: *Deleted

## 2017-11-05 DIAGNOSIS — C712 Malignant neoplasm of temporal lobe: Secondary | ICD-10-CM | POA: Diagnosis not present

## 2017-11-05 DIAGNOSIS — R2689 Other abnormalities of gait and mobility: Secondary | ICD-10-CM | POA: Diagnosis not present

## 2017-11-05 DIAGNOSIS — E860 Dehydration: Secondary | ICD-10-CM | POA: Diagnosis not present

## 2017-11-05 NOTE — Telephone Encounter (Signed)
Attempted to call patients spouse to set up 2 week hospital follow up.  Lm for returned call to schedule.

## 2017-11-05 NOTE — Progress Notes (Signed)
Verbal Order given to [facility] with [place] per Dr Mickeal Skinner for IVF home hydration 3 times a week.

## 2017-11-08 ENCOUNTER — Telehealth: Payer: Self-pay

## 2017-11-08 ENCOUNTER — Other Ambulatory Visit: Payer: Medicare HMO | Admitting: Internal Medicine

## 2017-11-08 DIAGNOSIS — R0902 Hypoxemia: Secondary | ICD-10-CM | POA: Diagnosis not present

## 2017-11-08 DIAGNOSIS — N3944 Nocturnal enuresis: Secondary | ICD-10-CM | POA: Diagnosis not present

## 2017-11-08 DIAGNOSIS — R531 Weakness: Secondary | ICD-10-CM | POA: Diagnosis not present

## 2017-11-08 DIAGNOSIS — Z515 Encounter for palliative care: Secondary | ICD-10-CM

## 2017-11-08 NOTE — Telephone Encounter (Signed)
Received call from [place] NP stating that she just finished a home visit with the patient and family. She stated that the patient is eating but minimal fluid intake and continues to receive IV fluids in the home via [facility]. She stated that she discussed natural physical decline and the role of hospice in end of life. She stated that the family does not wish to pursue hospice care at this time. She stated that the patient O2 sats at rest today were 88% which was not discussed with the family but she contacted this RN for an order for prn O2 in the home. This RN left a follow up voicemail message with the NP stating that the patient needs to be in the office registering O2 sats of 88% to qualify for an MD order for O2 in the home. Requested a return call with the follow up plan regarding obtaining O2 for home use and if she could order the home O2 through [facility].

## 2017-11-11 DIAGNOSIS — R2689 Other abnormalities of gait and mobility: Secondary | ICD-10-CM | POA: Diagnosis not present

## 2017-11-11 DIAGNOSIS — C719 Malignant neoplasm of brain, unspecified: Secondary | ICD-10-CM | POA: Diagnosis not present

## 2017-11-11 DIAGNOSIS — C712 Malignant neoplasm of temporal lobe: Secondary | ICD-10-CM | POA: Diagnosis not present

## 2017-11-11 DIAGNOSIS — R410 Disorientation, unspecified: Secondary | ICD-10-CM | POA: Diagnosis not present

## 2017-11-11 DIAGNOSIS — E860 Dehydration: Secondary | ICD-10-CM | POA: Diagnosis not present

## 2017-11-11 DIAGNOSIS — B370 Candidal stomatitis: Secondary | ICD-10-CM | POA: Diagnosis not present

## 2017-11-11 DIAGNOSIS — L0292 Furuncle, unspecified: Secondary | ICD-10-CM | POA: Diagnosis not present

## 2017-11-12 ENCOUNTER — Other Ambulatory Visit: Payer: Self-pay | Admitting: Internal Medicine

## 2017-11-12 MED ORDER — AMPHETAMINE-DEXTROAMPHETAMINE 30 MG PO TABS: 30.0000 mg | ORAL_TABLET | Freq: Every day | ORAL | 0 refills | Status: DC

## 2017-11-13 DIAGNOSIS — E860 Dehydration: Secondary | ICD-10-CM | POA: Diagnosis not present

## 2017-11-13 DIAGNOSIS — R2689 Other abnormalities of gait and mobility: Secondary | ICD-10-CM | POA: Diagnosis not present

## 2017-11-13 DIAGNOSIS — C712 Malignant neoplasm of temporal lobe: Secondary | ICD-10-CM | POA: Diagnosis not present

## 2017-11-14 DIAGNOSIS — M6281 Muscle weakness (generalized): Secondary | ICD-10-CM | POA: Diagnosis not present

## 2017-11-14 DIAGNOSIS — Z9181 History of falling: Secondary | ICD-10-CM | POA: Diagnosis not present

## 2017-11-14 DIAGNOSIS — C712 Malignant neoplasm of temporal lobe: Secondary | ICD-10-CM | POA: Diagnosis not present

## 2017-11-14 DIAGNOSIS — L89151 Pressure ulcer of sacral region, stage 1: Secondary | ICD-10-CM | POA: Diagnosis not present

## 2017-11-14 DIAGNOSIS — R0902 Hypoxemia: Secondary | ICD-10-CM | POA: Diagnosis not present

## 2017-11-14 DIAGNOSIS — R269 Unspecified abnormalities of gait and mobility: Secondary | ICD-10-CM | POA: Diagnosis not present

## 2017-11-14 DIAGNOSIS — L989 Disorder of the skin and subcutaneous tissue, unspecified: Secondary | ICD-10-CM | POA: Diagnosis not present

## 2017-11-14 DIAGNOSIS — E860 Dehydration: Secondary | ICD-10-CM | POA: Diagnosis not present

## 2017-11-14 DIAGNOSIS — C719 Malignant neoplasm of brain, unspecified: Secondary | ICD-10-CM | POA: Diagnosis not present

## 2017-11-14 DIAGNOSIS — K436 Other and unspecified ventral hernia with obstruction, without gangrene: Secondary | ICD-10-CM | POA: Diagnosis not present

## 2017-11-15 DIAGNOSIS — R2689 Other abnormalities of gait and mobility: Secondary | ICD-10-CM | POA: Diagnosis not present

## 2017-11-15 DIAGNOSIS — C712 Malignant neoplasm of temporal lobe: Secondary | ICD-10-CM | POA: Diagnosis not present

## 2017-11-15 DIAGNOSIS — E860 Dehydration: Secondary | ICD-10-CM | POA: Diagnosis not present

## 2017-11-18 ENCOUNTER — Encounter (INDEPENDENT_AMBULATORY_CARE_PROVIDER_SITE_OTHER): Payer: Medicare HMO | Admitting: Ophthalmology

## 2017-11-18 DIAGNOSIS — R2689 Other abnormalities of gait and mobility: Secondary | ICD-10-CM | POA: Diagnosis not present

## 2017-11-18 DIAGNOSIS — E860 Dehydration: Secondary | ICD-10-CM | POA: Diagnosis not present

## 2017-11-18 DIAGNOSIS — C712 Malignant neoplasm of temporal lobe: Secondary | ICD-10-CM | POA: Diagnosis not present

## 2017-11-19 ENCOUNTER — Encounter: Payer: Self-pay | Admitting: Internal Medicine

## 2017-11-19 ENCOUNTER — Inpatient Hospital Stay: Payer: Medicare HMO | Attending: Internal Medicine | Admitting: Internal Medicine

## 2017-11-19 ENCOUNTER — Telehealth: Payer: Self-pay

## 2017-11-19 VITALS — BP 131/66 | HR 78 | Temp 97.4°F | Resp 18 | Ht 64.0 in

## 2017-11-19 DIAGNOSIS — E785 Hyperlipidemia, unspecified: Secondary | ICD-10-CM

## 2017-11-19 DIAGNOSIS — K436 Other and unspecified ventral hernia with obstruction, without gangrene: Secondary | ICD-10-CM | POA: Diagnosis not present

## 2017-11-19 DIAGNOSIS — Z881 Allergy status to other antibiotic agents status: Secondary | ICD-10-CM | POA: Diagnosis not present

## 2017-11-19 DIAGNOSIS — Z79899 Other long term (current) drug therapy: Secondary | ICD-10-CM | POA: Diagnosis not present

## 2017-11-19 DIAGNOSIS — M6281 Muscle weakness (generalized): Secondary | ICD-10-CM | POA: Diagnosis not present

## 2017-11-19 DIAGNOSIS — Z9221 Personal history of antineoplastic chemotherapy: Secondary | ICD-10-CM | POA: Diagnosis not present

## 2017-11-19 DIAGNOSIS — Z7984 Long term (current) use of oral hypoglycemic drugs: Secondary | ICD-10-CM | POA: Diagnosis not present

## 2017-11-19 DIAGNOSIS — C712 Malignant neoplasm of temporal lobe: Secondary | ICD-10-CM | POA: Diagnosis not present

## 2017-11-19 DIAGNOSIS — R269 Unspecified abnormalities of gait and mobility: Secondary | ICD-10-CM | POA: Diagnosis not present

## 2017-11-19 DIAGNOSIS — Z9181 History of falling: Secondary | ICD-10-CM | POA: Diagnosis not present

## 2017-11-19 DIAGNOSIS — Z923 Personal history of irradiation: Secondary | ICD-10-CM | POA: Diagnosis not present

## 2017-11-19 DIAGNOSIS — K219 Gastro-esophageal reflux disease without esophagitis: Secondary | ICD-10-CM | POA: Diagnosis not present

## 2017-11-19 DIAGNOSIS — C719 Malignant neoplasm of brain, unspecified: Secondary | ICD-10-CM

## 2017-11-19 MED ORDER — AMPHETAMINE-DEXTROAMPHETAMINE 30 MG PO TABS: 30.0000 mg | ORAL_TABLET | Freq: Every day | ORAL | 0 refills | Status: DC

## 2017-11-19 NOTE — Progress Notes (Signed)
During visit patients spouse Shanon Brow was very adament about patient getting a weight.  During his several attempts and the refusal of the patient, the patient received a skin tear on the left forearm.  During the initial nursing assessment I also saw another fresh skin tear on the right elbow.  Both sites were assessed and dressed during the visit.  Education on safe handling of the patient was given.  Patient denied knowing when she received them.

## 2017-11-19 NOTE — Telephone Encounter (Signed)
Printed avs abnd calender of upcoming appointment.oper 9/17 los

## 2017-11-19 NOTE — Progress Notes (Signed)
[place] at [place], [place] [address] [phone]   Interval Evaluation  Date of Service: 11/19/17 Patient Name: Kelly Maxwell Patient MRN: [id number] Patient DOB: [birth year] Provider: Ventura Sellers, MD  Identifying Statement:  Kelly Maxwell is a 72 y.o. 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  Path demonstrates Gliosarcoma     - 02/18/2012 Radiation Therapy    Completes RT and concurrent Temodar with Dr. Tammi Klippel     - 09/14/2013 Chemotherapy    Completes 18 cycles of adjuvant Temodar, treated at [place].     09/29/2015 Progression    Progression #1.  Starts metronomic temodar 50mg /m2 daily     - 09/26/2016 Chemotherapy    Completes 1 year metronomic temodar     04/19/2017 Progression    Progression #2.  Recommendation for Avastin 10mg /kg q2 weeks    09/24/2017 -  Chemotherapy    The patient had [No matching medication found in this treatment plan]  for chemotherapy treatment.      Interval History:  Kelly Maxwell presents today for follow up after recent hospitalization.  Energy and communication have been improved since starting on 3x weekly IV saline.  She is able to swallow liquids on her own but per family isn't motivated to do so.  No recent falls, bouts of confusion but no clear seizures.     Medications: Current Outpatient Medications on File Prior to Visit  Medication Sig Dispense Refill  . amLODipine (NORVASC) 5 MG tablet Take 1 tablet (5 mg total) by mouth daily. 30 tablet 3  . amphetamine-dextroamphetamine (ADDERALL) 30 MG tablet Take 1 tablet by mouth daily. 30 tablet 0  . atorvastatin (LIPITOR) 40 MG tablet Take 40 mg by mouth daily.     . bisacodyl (DULCOLAX) 5 MG EC tablet Take 5 mg by mouth daily.      Steva Colder Extract POWD Take 1 packet by mouth daily.     . Bromfenac Sodium (PROLENSA) 0.07 % SOLN Place 1 drop into the left eye daily. Reported on 09/02/2015     . cholecalciferol (VITAMIN D) 1000 units tablet Take 1 tablet (1,000 Units total) by mouth daily. 30 tablet 3  . dexamethasone (DECADRON) 2 MG tablet Take 1 tablet (2 mg total) by mouth daily. 60 tablet 2  . Diaper Rash Products ([employer] DIAPER) 1-10 % CREA APPLY EXTERNALLY AS DIRECTED AS NEEDED WITH EACH DIAPHER CHANGE  6  . Difluprednate (DUREZOL) 0.05 % EMUL Place 1 drop into the left eye 2 (two) times daily.     Marland Kitchen lacosamide (VIMPAT) 50 MG TABS tablet Take 1 tablet (50 mg total) by mouth 2 (two) times daily. 60 tablet 3  . Melatonin 5 MG CAPS Take 10-20 mg by mouth at bedtime.     . metFORMIN (GLUCOPHAGE) 500 MG tablet Take 500 mg by mouth 2 (two) times daily with a meal.     . NON FORMULARY Take 1 capsule by mouth 2 (two) times daily. Lazarus Natural CBD Oil     . nystatin (MYCOSTATIN) 100000 UNIT/ML suspension Take 5 mLs by mouth 4 (four) times daily. Swish and spit out    . nystatin cream (MYCOSTATIN) Apply 1 application topically as  needed for dry skin.     . pantoprazole (PROTONIX) 40 MG tablet Take 1 tablet (40 mg total) by mouth daily. 30 tablet 3  . clonazePAM (KLONOPIN) 0.5 MG tablet Take 1 tablet (0.5 mg total) by mouth at bedtime as needed for anxiety. (Patient not taking: Reported on 10/24/2017) 12 tablet 0  . feeding supplement, ENSURE ENLIVE, (ENSURE ENLIVE) LIQD Take 237 mLs by mouth 2 (two) times daily between meals. (Patient not taking: Reported on 11/19/2017) 237 mL 12  . GLEOSTINE 10 MG capsule Take 10 mg by mouth daily.  0  . GLEOSTINE 100 MG capsule Take 100 mg by mouth as directed.   0  . GLEOSTINE 40 MG capsule Take 40 mg by mouth daily.  0  . magic mouthwash w/lidocaine SOLN Take 5 mLs by mouth 4 (four) times daily as needed for mouth pain. (Patient not taking: Reported on 11/19/2017) 100 mL 0  .  ondansetron (ZOFRAN) 8 MG tablet Take 1 tablets (8mg ) by mouth once 30-60 prior to lomustine and Q 8h prn N/V (Patient not taking: Reported on 11/19/2017) 30 tablet 1  . senna-docusate (SENOKOT-S) 8.6-50 MG tablet Take 1 tablet by mouth at bedtime as needed for mild constipation. (Patient not taking: Reported on 11/19/2017)     No current facility-administered medications on file prior to visit.     Allergies:  Allergies  Allergen Reactions  . Ciprofloxacin     AMS  . Codeine Nausea And Vomiting and Nausea Only  . 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    . Compazine [Prochlorperazine Edisylate] Other (See Comments) and Rash    Became hyper Became hyper   Past Medical History:  Past Medical History:  Diagnosis Date  . Cancer ([place])   . Depression   . GERD (gastroesophageal reflux disease)   . Glial neoplasm of brain ([place]) 12/17/11   left temporal, grade IV, 2.7 x 3.6 x 2.5 cm ring enhancing; numerous small surrounding satellite lesions 12/13/11  . Headache(784.0)   . Hiatal hernia   . History of radiation therapy 01/07/2012-02/18/12   left temporal  60GY  . HLD (hyperlipidemia)   . PONV (postoperative nausea and vomiting)   . 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 BUNIONECTOMY  2001   right  . CATARACT EXTRACTION Left   . CRANIOTOMY  12/17/2011   Procedure: CRANIOTOMY TUMOR EXCISION;  Surgeon: Otilio Connors, MD;  Location: [facility];  Service: Neurosurgery;  Laterality: Left;  LEFT temporal craniotomy with stealth for tumor resection  . DILATION AND CURETTAGE OF UTERUS  2007  . TOTAL KNEE ARTHROPLASTY  2000   left  . VENTRAL HERNIA REPAIR N/A 09/03/2015   Procedure: LAPAROSCOPIC repair of incarcerated spigelian hernia with mesh, reduction and repair ;   Surgeon: [name], MD;  Location: [facility];  Service: General;  Laterality: N/A;   Social History:  Social History   Socioeconomic History  . Marital status: Married    Spouse name: Not on file  . Number of children: 3  . Years of education: Not on file  . Highest education level: Not on file  Occupational History  . Not on file  Social Needs  . Financial resource strain: Not on file  . Food insecurity:    Worry: Not on file  Inability: Not on file  . Transportation needs:    Medical: Not on file    Non-medical: Not on file  Tobacco Use  . Smoking status: Never Smoker  . Smokeless tobacco: Never Used  Substance and Sexual Activity  . Alcohol use: No  . Drug use: No  . Sexual activity: Yes  Lifestyle  . Physical activity:    Days per week: Not on file    Minutes per session: Not on file  . Stress: Not on file  Relationships  . Social connections:    Talks on phone: Not on file    Gets together: Not on file    Attends religious service: Not on file    Active member of club or organization: Not on file    Attends meetings of clubs or organizations: Not on file    Relationship status: Not on file  . Intimate partner violence:    Fear of current or ex partner: Not on file    Emotionally abused: Not on file    Physically abused: Not on file    Forced sexual activity: Not on file  Other Topics Concern  . Not on file  Social History Narrative   2nd marriage of 6 years, previously widowed   Family History:  Family History  Problem Relation Age of Onset  . Heart disease Mother     Review of Systems: Limited by aphasia  Physical Exam: Vitals:   11/19/17 1237  BP: 131/66  Pulse: 78  Resp: 18  Temp: (!) 97.4 F (36.3 C)  SpO2: 98%   KPS: 60. General: Cooperative, pleasant, in no acute distress Head: Normal EENT: No conjunctival injection or scleral icterus. Oral mucosa moist Lungs: Resp effort normal Cardiac: Regular rate and rhythm Abdomen: Soft,  non-distended abdomen Skin: multiple regions of skin breakdown Extremities: +le edema  Neurologic Exam: Mental Status: Alert, not attentive. Oriented to self and environemnt.  Language is impaired to fluency and comprehension.  Struggles with multiple step commands.  Very poor recall, poor short term memory   Cranial Nerves: Visual acuity is grossly normal. Visual fields are full. Extra-ocular movements intact. Left eye ptosis, chronic. Face is symmetric, tongue midline. Motor: Tone and bulk are normal. Power is full in both arms and legs. Reflexes are symmetric, no pathologic reflexes present. Intact finger to nose bilaterally Sensory: Intact to light touch and temperature Gait: Deferred today   Labs: I have reviewed the data as listed    Component Value Date/Time   NA 144 10/31/2017 0350   NA 140 09/11/2013 1238   K 3.7 10/31/2017 0350   K 4.7 09/11/2013 1238   CL 110 10/31/2017 0350   CL 111 (H) 08/04/2012 1008   CO2 25 10/31/2017 0350   CO2 23 09/11/2013 1238   GLUCOSE 90 10/31/2017 0350   GLUCOSE 87 09/11/2013 1238   GLUCOSE 86 08/04/2012 1008   BUN 10 10/31/2017 0350   BUN 13.3 10/29/2014 1127   CREATININE 0.32 (L) 10/31/2017 0350   CREATININE 0.57 (L) 08/09/2017 1159   CREATININE 0.8 10/29/2014 1127   CALCIUM 8.9 10/31/2017 0350   CALCIUM 9.5 09/11/2013 1238   PROT 5.4 (L) 10/31/2017 0350   PROT 6.6 09/11/2013 1238   ALBUMIN 2.9 (L) 10/31/2017 0350   ALBUMIN 3.9 09/11/2013 1238   AST 16 10/31/2017 0350   AST 29 08/09/2017 1159   AST 19 09/11/2013 1238   ALT 19 10/31/2017 0350   ALT 121 (H) 08/09/2017 1159   ALT 27  09/11/2013 1238   ALKPHOS 51 10/31/2017 0350   ALKPHOS 106 09/11/2013 1238   BILITOT 0.7 10/31/2017 0350   BILITOT 0.9 08/09/2017 1159   BILITOT 0.72 09/11/2013 1238   GFRNONAA >60 10/31/2017 0350   GFRNONAA >60 08/09/2017 1159   GFRAA >60 10/31/2017 0350   GFRAA >60 08/09/2017 1159   Lab Results  Component Value Date   WBC 2.3 (L) 10/31/2017     NEUTROABS 1.6 (L) 10/31/2017   HGB 10.2 (L) 10/31/2017   HCT 31.6 (L) 10/31/2017   MCV 93.2 10/31/2017   PLT 106 (L) 10/31/2017    Assessment/Plan 1. 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 Colton is clinically stable today.  Because of poor functional status she is not a candidate for further chemotherapy at this time.  They continue to defer recommendation for hospice-based care.  Will continue 3x weekly IV saline as this seems to be helping keep her out of inpatient setting, given 2 recent admission for dehydration.  We discussed potential for discontinuing Vimpat given its effects on suppressing the nervous system.    Will refill adderral.    We appreciate the opportunity to participate in the care of Kelly Maxwell.   All questions were answered. The patient knows to call the clinic with any problems, questions or concerns. No barriers to learning were detected.  The total time spent in the encounter was 25 minutes and more than 50% was on counseling and review of test results   Ventura Sellers, MD Medical Director of Neuro-Oncology [facility] at [place] 11/19/17 12:50 PM

## 2017-11-20 DIAGNOSIS — C712 Malignant neoplasm of temporal lobe: Secondary | ICD-10-CM | POA: Diagnosis not present

## 2017-11-20 DIAGNOSIS — E860 Dehydration: Secondary | ICD-10-CM | POA: Diagnosis not present

## 2017-11-20 DIAGNOSIS — R2689 Other abnormalities of gait and mobility: Secondary | ICD-10-CM | POA: Diagnosis not present

## 2017-11-23 DIAGNOSIS — E860 Dehydration: Secondary | ICD-10-CM | POA: Diagnosis not present

## 2017-11-23 DIAGNOSIS — R2689 Other abnormalities of gait and mobility: Secondary | ICD-10-CM | POA: Diagnosis not present

## 2017-11-23 DIAGNOSIS — C712 Malignant neoplasm of temporal lobe: Secondary | ICD-10-CM | POA: Diagnosis not present

## 2017-11-25 ENCOUNTER — Other Ambulatory Visit: Payer: Medicare HMO | Admitting: Internal Medicine

## 2017-11-25 DIAGNOSIS — Z515 Encounter for palliative care: Secondary | ICD-10-CM

## 2017-11-25 DIAGNOSIS — E860 Dehydration: Secondary | ICD-10-CM | POA: Diagnosis not present

## 2017-11-25 DIAGNOSIS — R531 Weakness: Secondary | ICD-10-CM | POA: Diagnosis not present

## 2017-11-25 DIAGNOSIS — R0902 Hypoxemia: Secondary | ICD-10-CM | POA: Diagnosis not present

## 2017-11-25 DIAGNOSIS — C712 Malignant neoplasm of temporal lobe: Secondary | ICD-10-CM | POA: Diagnosis not present

## 2017-11-25 DIAGNOSIS — R2689 Other abnormalities of gait and mobility: Secondary | ICD-10-CM | POA: Diagnosis not present

## 2017-11-26 ENCOUNTER — Other Ambulatory Visit: Payer: Medicare HMO | Admitting: Internal Medicine

## 2017-11-26 DIAGNOSIS — R0902 Hypoxemia: Secondary | ICD-10-CM | POA: Insufficient documentation

## 2017-11-26 NOTE — Progress Notes (Signed)
PALLIATIVE CARE CONSULT VISIT   PATIENT NAME: Kelly Maxwell DOB: 04-22-1945 MRN: [id number]  PRIMARY CARE PROVIDER:   Wenda Low, MD  REFERRING PROVIDER: Dr. Mickeal Skinner  RESPONSIBLE PARTY:  Shanon Brow Meanor(husband). Erin(daughter)    RECOMMENDATIONS and PLAN:   1. Weakness R53.1:  Chronic state related to terminal illness.  Provide continued support and safe environment. Fall prevention encouraged.   2.  Hyoxia  R09.02:  New occurrence with sats of 87-88% today.  Encouraged deep breathing and coughing exercises.  Notified oncologist practice of same with recommendations of prn supplemental O2 use. Pending appt with PCP on 11/11/17.  Discuss same with PCP.  Aspiration precautions encouraged.  Good body positioning.  Continue to monitor.    3.  Palliative care encounter  R51.5:  Poor state of overall health.  Family is hopeful for improvement of mobility, alertness and nutrition with receiving IV fluids.  No plans for continuing treatment of brain cancer.  Pt. Remains a full code per desire of family. She will continue to receive PT and supportive care.  Palliative care will continue to follow.  Hospice care has been recommended after completion of PT.  I spent 30 minutes providing this consultation,  from 11:00am to 11:30am at home. More than 50% of the time in this consultation was spent coordinating communication with pt and spouse.   HISTORY OF PRESENT ILLNESS:  Kelly Maxwell is a 72 y.o. finds that she was recently hospitalized due to dehydration and altered mental status.  Spouse reports improvement of alertness since receiving IV saline at home 3x/week.  She continues to eat at least 2 meals per day with occasional snacks.  Family reports that she is able to walk after being assisted OOB, with cueing and use of walker.  She still requires encouragement for oral hydration. She is incontinent of B&B.  CODE STATUS: FULL CODE  PPS: 30% HOSPICE ELIGIBILITY/DIAGNOSIS: YES/ Gliosarcoma.  No  further chem or immunotherapy  PAST MEDICAL HISTORY:  Past Medical History:  Diagnosis Date  . Cancer ([place])   . Depression   . GERD (gastroesophageal reflux disease)   . Glial neoplasm of brain ([place]) 12/17/11   left temporal, grade IV, 2.7 x 3.6 x 2.5 cm ring enhancing; numerous small surrounding satellite lesions 12/13/11  . Headache(784.0)   . Hiatal hernia   . History of radiation therapy 01/07/2012-02/18/12   left temporal  60GY  . HLD (hyperlipidemia)   . PONV (postoperative nausea and vomiting)   . 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 Smoking status: Never Smoker  . Smokeless tobacco: Never Used  Substance Use Topics  . Alcohol use: No    ALLERGIES:  Allergies  Allergen Reactions  . Ciprofloxacin     AMS  . Codeine Nausea And Vomiting and Nausea Only  . 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    . Compazine [Prochlorperazine Edisylate] Other (See Comments) and Rash    Became hyper Became hyper     PERTINENT MEDICATIONS:  Outpatient Encounter Medications as of 11/08/2017  Medication Sig  . amLODipine (NORVASC) 5 MG tablet Take 1 tablet (5 mg total) by mouth daily.  Marland Kitchen atorvastatin (LIPITOR) 40 MG tablet Take 40 mg by mouth daily.   . bisacodyl (DULCOLAX) 5 MG EC tablet Take 5 mg by mouth daily.  Marland Kitchen  Boswellia Serrata Extract POWD Take 1 packet by mouth daily.   . Bromfenac Sodium (PROLENSA) 0.07 % SOLN Place 1 drop into the left eye daily. Reported on 09/02/2015   . cholecalciferol (VITAMIN D) 1000 units tablet Take 1 tablet (1,000 Units total) by mouth daily.  . clonazePAM (KLONOPIN) 0.5 MG tablet Take 1 tablet (0.5 mg total) by mouth at bedtime as needed for anxiety. (Patient not taking: Reported on 10/24/2017)  . dexamethasone (DECADRON) 2 MG tablet Take 1 tablet (2 mg total)  by mouth daily.  . Diaper Rash Products ([employer] DIAPER) 1-10 % CREA APPLY EXTERNALLY AS DIRECTED AS NEEDED WITH EACH DIAPHER CHANGE  . Difluprednate (DUREZOL) 0.05 % EMUL Place 1 drop into the left eye 2 (two) times daily.   . feeding supplement, ENSURE ENLIVE, (ENSURE ENLIVE) LIQD Take 237 mLs by mouth 2 (two) times daily between meals. (Patient not taking: Reported on 11/19/2017)  . GLEOSTINE 10 MG capsule Take 10 mg by mouth daily.  Marland Kitchen GLEOSTINE 100 MG capsule Take 100 mg by mouth as directed.   Marland Kitchen GLEOSTINE 40 MG capsule Take 40 mg by mouth daily.  Marland Kitchen lacosamide (VIMPAT) 50 MG TABS tablet Take 1 tablet (50 mg total) by mouth 2 (two) times daily.  . magic mouthwash w/lidocaine SOLN Take 5 mLs by mouth 4 (four) times daily as needed for mouth pain. (Patient not taking: Reported on 11/19/2017)  . Melatonin 5 MG CAPS Take 10-20 mg by mouth at bedtime.   . metFORMIN (GLUCOPHAGE) 500 MG tablet Take 500 mg by mouth 2 (two) times daily with a meal.   . NON FORMULARY Take 1 capsule by mouth 2 (two) times daily. Lazarus Natural CBD Oil   . nystatin (MYCOSTATIN) 100000 UNIT/ML suspension Take 5 mLs by mouth 4 (four) times daily. Swish and spit out  . nystatin cream (MYCOSTATIN) Apply 1 application topically as needed for dry skin.   Marland Kitchen ondansetron (ZOFRAN) 8 MG tablet Take 1 tablets (8mg ) by mouth once 30-60 prior to lomustine and Q 8h prn N/V (Patient not taking: Reported on 11/19/2017)  . pantoprazole (PROTONIX) 40 MG tablet Take 1 tablet (40 mg total) by mouth daily.  Marland Kitchen senna-docusate (SENOKOT-S) 8.6-50 MG tablet Take 1 tablet by mouth at bedtime as needed for mild constipation. (Patient not taking: Reported on 11/19/2017)  . [DISCONTINUED] amphetamine-dextroamphetamine (ADDERALL) 30 MG tablet Take 1 tablet by mouth daily.  . [DISCONTINUED] fluconazole (DIFLUCAN) 40 MG/ML suspension Take 2.5 mLs (100 mg total) by mouth daily.   No facility-administered encounter medications on file as of 11/08/2017.      PHYSICAL EXAM:   General: Frail and fragile appearing elderly female relining in bed.  In NAD Cardiovascular: regular rate and rhythm Pulmonary: O2 sats 87-88% on rm air.clear ant fields.  No increased work of breathing Abdomen: soft, nontender, + bowel sounds.  Feeds self finger foods GU: no suprapubic tenderness. External urinary suction device in use. Dark amber urine noted Extremities: Trace edema ankles Skin: Pale in color.  Scattered purpura of BUE Neurological: Weakness. Somnolent but arouses to name.  Unable to determine orientation due to cognitive deficit.  Speaks in 2-3 words or nods head.   Gonzella Lex, NP-C

## 2017-11-26 NOTE — Progress Notes (Signed)
PALLIATIVE CARE CONSULT VISIT   PATIENT NAME: Kelly Maxwell DOB: [birth year] MRN: [id number]  PRIMARY CARE PROVIDER:   Wenda Low, MD  REFERRING PROVIDER:  Dr. Mickeal Skinner  RESPONSIBLE PARTY:   Shanon Brow Galiano(husband), Erin(daughter)     RECOMMENDATIONS and PLAN:   1. Weakness R53.1  Chronic terminal state.  Increased sleeping. Expect additional decline.  Continue to provide supportive care.  Comfort meals.  2. Hypoxia  R09.02:  Improved with prn use of supplemental O2.  Continue to monitor.  3.  Pallliative Care Encounter Z51.5:  Remains a full code. Husband and daughter desire to continue administration of 1liter of normal saline IV 3x/wk at home.  Explanation given to husband that this is not a normal occurrence. Eligible for transition to Hospice care when not receiving IV fluids related to natural decline of health. No acute findings today. Palliative will continue to follow.  Support to pt and spouse given  I spent 30 minutes providing this consultation,  from 3:00pm to 3:30pm at home. More than 50% of the time in this consultation was spent coordinating communication with patient and husband.   HISTORY OF PRESENT ILLNESS:  Follow-up with  [name].  Husband reports that patient is sleeping additional hours during the day and does not normally awaken until early afternoon. Confusion continues. No reports of seizure activity, pain or falls. She eats two meals per day with assistance.  She is total care and is now incontinent of B&B.  She requires an aid 7 days/wk to perform all ADLs.  OOB occasionally with assistance to ambulate with use of walker an individual and cuing. Continues IV infusion 3x/wk per [facility].    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  Family declines at this time  PAST MEDICAL HISTORY:  Past Medical History:  Diagnosis Date  .  Cancer ([place])   . Depression   . GERD (gastroesophageal reflux disease)   . 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 Headache(784.0)   . Hiatal hernia   . History of radiation therapy 01/07/2012-02/18/12   left temporal  60GY  . HLD (hyperlipidemia)   . PONV (postoperative nausea and vomiting)   . 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 Smoking status: Never Smoker  . Smokeless tobacco: Never Used  Substance Use Topics  . Alcohol use: No    ALLERGIES:  Allergies  Allergen Reactions  . Ciprofloxacin     AMS  . Codeine Nausea And Vomiting and Nausea Only  . 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    . Compazine [Prochlorperazine Edisylate] Other (See Comments) and Rash    Became hyper Became hyper     PHYSICAL EXAM:   BP124/80  P76  R20  O2 Sats 95-96% rm air  General: Chronically ill, fragile and frail appearing.  Reclining in bed and in NAD EENT: Dilated and equal pupils.  Mucus membranes are dry Cardiovascular: regular rate and rhythm. 1L Normal saline IV infusion currently viai pic line Pulmonary: clear ant fields Abdomen: soft, nontender, + bowel sounds GU: no suprapubic tenderness, Perineal suction in use Extremities: Moderate edema bilat  ankles Skin: Pale in color.  Exposed skin is intact Neurological: Somnolent.  Arouses to gentle touch.  Unable to determine orientation due to cognitive decline.  Nods head appropriately. Speaks in 1-2 words.  Gonzella Lex, NP-C

## 2017-11-27 ENCOUNTER — Telehealth: Payer: Self-pay

## 2017-11-27 DIAGNOSIS — E860 Dehydration: Secondary | ICD-10-CM | POA: Diagnosis not present

## 2017-11-27 DIAGNOSIS — R2689 Other abnormalities of gait and mobility: Secondary | ICD-10-CM | POA: Diagnosis not present

## 2017-11-27 DIAGNOSIS — C712 Malignant neoplasm of temporal lobe: Secondary | ICD-10-CM | POA: Diagnosis not present

## 2017-11-27 NOTE — Telephone Encounter (Signed)
Received a message from patient daughter Kelly Maxwell stating that she feels like [name] is having focal seizures because she will be speaking fine and then her speech will become garbled. She has tried a higher dose of the Vimpat and during these times the patient does not have these episodes. After speaking with Dr. Mickeal Skinner contacted the daughter Kelly Maxwell and left a message that she can increase the dose of the Vimpat to 100 mg BID and if she has any questions or concerns to call back.

## 2017-11-29 DIAGNOSIS — L89153 Pressure ulcer of sacral region, stage 3: Secondary | ICD-10-CM | POA: Diagnosis not present

## 2017-11-29 DIAGNOSIS — E860 Dehydration: Secondary | ICD-10-CM | POA: Diagnosis not present

## 2017-11-29 DIAGNOSIS — R2689 Other abnormalities of gait and mobility: Secondary | ICD-10-CM | POA: Diagnosis not present

## 2017-11-29 DIAGNOSIS — C712 Malignant neoplasm of temporal lobe: Secondary | ICD-10-CM | POA: Diagnosis not present

## 2017-12-02 DIAGNOSIS — C712 Malignant neoplasm of temporal lobe: Secondary | ICD-10-CM | POA: Diagnosis not present

## 2017-12-02 DIAGNOSIS — R2689 Other abnormalities of gait and mobility: Secondary | ICD-10-CM | POA: Diagnosis not present

## 2017-12-02 DIAGNOSIS — E860 Dehydration: Secondary | ICD-10-CM | POA: Diagnosis not present

## 2017-12-04 DIAGNOSIS — C712 Malignant neoplasm of temporal lobe: Secondary | ICD-10-CM | POA: Diagnosis not present

## 2017-12-04 DIAGNOSIS — E860 Dehydration: Secondary | ICD-10-CM | POA: Diagnosis not present

## 2017-12-04 DIAGNOSIS — R2689 Other abnormalities of gait and mobility: Secondary | ICD-10-CM | POA: Diagnosis not present

## 2017-12-06 ENCOUNTER — Other Ambulatory Visit: Payer: Medicare HMO | Admitting: Internal Medicine

## 2017-12-06 DIAGNOSIS — C712 Malignant neoplasm of temporal lobe: Secondary | ICD-10-CM | POA: Diagnosis not present

## 2017-12-06 DIAGNOSIS — Z515 Encounter for palliative care: Secondary | ICD-10-CM | POA: Diagnosis not present

## 2017-12-06 DIAGNOSIS — R531 Weakness: Secondary | ICD-10-CM | POA: Diagnosis not present

## 2017-12-06 DIAGNOSIS — R2689 Other abnormalities of gait and mobility: Secondary | ICD-10-CM | POA: Diagnosis not present

## 2017-12-06 DIAGNOSIS — E860 Dehydration: Secondary | ICD-10-CM | POA: Diagnosis not present

## 2017-12-06 NOTE — Progress Notes (Signed)
PALLIATIVE CARE CONSULT VISIT   PATIENT NAME: Kelly Maxwell DOB: June 21, 1945 MRN: [id number]  PRIMARY CARE PROVIDER:   Wenda Low, MD  REFERRING PROVIDER: Dr. Mickeal Skinner RESPONSIBLE PARTY:   Sandria Manly) and Erin(daughter)      RECOMMENDATIONS and PLAN:  1.  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 Nearing end of life. Continued decline despite intravenous saline.   Strongly recommended transition to Hospice for additional comfort care.   2.  Palliative Care Encounter  Z51.5:  Husband at bedside during visit.  Pt. Remains a full code at this time of decline. Previous update from home health nurse via phone who has phoned oncologist and daughter with updates.  Full explanation of pt status to husband and voicemail message left on clinical line for Dr. Mickeal Skinner requesting a return call back to this NP to discuss pt status.  Encouraged spouse to discuss with pt's daughter and son related to readdressing code status and end of life care plans.  I have recommended transition to Hospice care to spouse due to pt's poor state of health and for increased support for pt and family.  He states that he will discuss with daughter and I will revisit pt on 12/09/17.  Reviewed potential for additional decline of health prior to next home visit. He assures that the family will address her needs and she will continue to have additional assistance from hired aids. Liquid Tylenol prn for apparent discomfort.  Previous discussions related to Hospice care have been met with resistance.   I spent 40 minutes providing this consultation,  from 2:00pm to 2:40pm at home. More than 50% of the time in this consultation was spent coordinating communication with spouse and [place].   HISTORY OF PRESENT ILLNESS: Followup with  Kelly Maxwell.  Husband reports additional decline over last 6 days with less alertness, increased weakness,  drinking sips at most,  non-verbal and no BM x 4 days.  Reports a fall several days ago when daughter attempted to get pt OOB for ambulation. She has continued to receive 1L of normal saline IV 3x/week.  No return to hospital since last visit.  Unable to determine weight due to immobility but appears to have lost weight.  Pt is no longer receiving chemo or immunotherapy. He reports difficulty in administering oral meds.   CODE STATUS: FULL CODE  PPS: 20%  Sips, totally bedbound HOSPICE ELIGIBILITY/DIAGNOSIS: YES, advanced gliosarcoma, protein calorie malnutrition  PAST MEDICAL HISTORY:  Past Medical History:  Diagnosis Date  . Cancer ([place])   . Depression   . GERD (gastroesophageal reflux disease)   . 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 Headache(784.0)   . Hiatal hernia   . History of radiation therapy 01/07/2012-02/18/12   left temporal  60GY  . HLD (hyperlipidemia)   . PONV (postoperative nausea and vomiting)   . 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 Smoking status: Never Smoker  . Smokeless tobacco: Never Used  Substance Use Topics  . Alcohol use: No    ALLERGIES:  Allergies  Allergen Reactions  . Ciprofloxacin     AMS  . Codeine Nausea And Vomiting and Nausea Only  . Succinylcholine Chloride Other (  See Comments)    Pseudocholinesterase Deficiency - Required post-op ventilation.    . Compazine [Prochlorperazine Edisylate] Other (See Comments) and Rash    Became hyper Became hyper     PERTINENT MEDICATIONS:  Outpatient Encounter Medications as of 12/06/2017  Medication Sig  . amLODipine (NORVASC) 5 MG tablet Take 1 tablet (5 mg total) by mouth daily.  Marland Kitchen amphetamine-dextroamphetamine (ADDERALL) 30 MG tablet Take 1 tablet by mouth daily.  Marland Kitchen atorvastatin  (LIPITOR) 40 MG tablet Take 40 mg by mouth daily.   . bisacodyl (DULCOLAX) 5 MG EC tablet Take 5 mg by mouth daily.  Steva Colder Extract POWD Take 1 packet by mouth daily.   . Bromfenac Sodium (PROLENSA) 0.07 % SOLN Place 1 drop into the left eye daily. Reported on 09/02/2015   . cholecalciferol (VITAMIN D) 1000 units tablet Take 1 tablet (1,000 Units total) by mouth daily.  . clonazePAM (KLONOPIN) 0.5 MG tablet Take 1 tablet (0.5 mg total) by mouth at bedtime as needed for anxiety. (Patient not taking: Reported on 10/24/2017)  . dexamethasone (DECADRON) 2 MG tablet Take 1 tablet (2 mg total) by mouth daily.  . Diaper Rash Products ([employer] DIAPER) 1-10 % CREA APPLY EXTERNALLY AS DIRECTED AS NEEDED WITH EACH DIAPHER CHANGE  . Difluprednate (DUREZOL) 0.05 % EMUL Place 1 drop into the left eye 2 (two) times daily.   . feeding supplement, ENSURE ENLIVE, (ENSURE ENLIVE) LIQD Take 237 mLs by mouth 2 (two) times daily between meals. (Patient not taking: Reported on 11/19/2017)  . GLEOSTINE 10 MG capsule Take 10 mg by mouth daily.  Marland Kitchen GLEOSTINE 100 MG capsule Take 100 mg by mouth as directed.   Marland Kitchen GLEOSTINE 40 MG capsule Take 40 mg by mouth daily.  Marland Kitchen lacosamide (VIMPAT) 50 MG TABS tablet Take 1 tablet (50 mg total) by mouth 2 (two) times daily.  . magic mouthwash w/lidocaine SOLN Take 5 mLs by mouth 4 (four) times daily as needed for mouth pain. (Patient not taking: Reported on 11/19/2017)  . Melatonin 5 MG CAPS Take 10-20 mg by mouth at bedtime.   . metFORMIN (GLUCOPHAGE) 500 MG tablet Take 500 mg by mouth 2 (two) times daily with a meal.   . NON FORMULARY Take 1 capsule by mouth 2 (two) times daily. Lazarus Natural CBD Oil   . nystatin (MYCOSTATIN) 100000 UNIT/ML suspension Take 5 mLs by mouth 4 (four) times daily. Swish and spit out  . nystatin cream (MYCOSTATIN) Apply 1 application topically as needed for dry skin.   Marland Kitchen ondansetron (ZOFRAN) 8 MG tablet Take 1 tablets (10m) by mouth once 30-60 prior to  lomustine and Q 8h prn N/V (Patient not taking: Reported on 11/19/2017)  . pantoprazole (PROTONIX) 40 MG tablet Take 1 tablet (40 mg total) by mouth daily.  .Marland Kitchensenna-docusate (SENOKOT-S) 8.6-50 MG tablet Take 1 tablet by mouth at bedtime as needed for mild constipation. (Patient not taking: Reported on 11/19/2017)   No facility-administered encounter medications on file as of 12/06/2017.     PHYSICAL EXAM:   BP 130/90  P 94  R20   Sats 94% rm air  General: Gravely ill appearing, frail and fragile elderly female resting in fetal position in hospital bed Cardiovascular: regular rate and rhythm Pulmonary: clear ant fields.  Unlabored respirations Abdomen: soft, nontender, hypoactive bowel sounds GU: no suprapubic tenderness Extremities: Edema of RUE and generalized of BLE Skin: Pale in color.  Multiple patches of purpura of upper and lower extremities.  Skin tears  of RLE reported but not visualized due to bandage.  RUE and BLE are cool to touch.  No mottling noted. Neurological: Constant groaning, Forehead is resting in hand.  Eyes open intermittently but otherwise remain closed.  Does not follow commands or able to nod in response to questions.    Gonzella Lex, NP

## 2017-12-07 ENCOUNTER — Inpatient Hospital Stay (HOSPITAL_COMMUNITY)
Admission: EM | Admit: 2017-12-07 | Discharge: 2017-12-10 | DRG: 054 | Disposition: A | Discharge status: Hospice | Payer: Medicare HMO | Attending: Family Medicine | Admitting: Family Medicine

## 2017-12-07 ENCOUNTER — Other Ambulatory Visit: Payer: Self-pay

## 2017-12-07 ENCOUNTER — Encounter (HOSPITAL_COMMUNITY): Payer: Self-pay

## 2017-12-07 ENCOUNTER — Emergency Department (HOSPITAL_COMMUNITY): Payer: Medicare HMO

## 2017-12-07 DIAGNOSIS — C719 Malignant neoplasm of brain, unspecified: Principal | ICD-10-CM | POA: Diagnosis present

## 2017-12-07 DIAGNOSIS — R531 Weakness: Secondary | ICD-10-CM | POA: Diagnosis not present

## 2017-12-07 DIAGNOSIS — R402213 Coma scale, best verbal response, none, at hospital admission: Secondary | ICD-10-CM | POA: Diagnosis present

## 2017-12-07 DIAGNOSIS — Z79899 Other long term (current) drug therapy: Secondary | ICD-10-CM

## 2017-12-07 DIAGNOSIS — Z7401 Bed confinement status: Secondary | ICD-10-CM | POA: Diagnosis not present

## 2017-12-07 DIAGNOSIS — S0990XA Unspecified injury of head, initial encounter: Secondary | ICD-10-CM | POA: Diagnosis not present

## 2017-12-07 DIAGNOSIS — K219 Gastro-esophageal reflux disease without esophagitis: Secondary | ICD-10-CM | POA: Diagnosis present

## 2017-12-07 DIAGNOSIS — E860 Dehydration: Secondary | ICD-10-CM | POA: Diagnosis not present

## 2017-12-07 DIAGNOSIS — Z881 Allergy status to other antibiotic agents status: Secondary | ICD-10-CM

## 2017-12-07 DIAGNOSIS — Z885 Allergy status to narcotic agent status: Secondary | ICD-10-CM

## 2017-12-07 DIAGNOSIS — B370 Candidal stomatitis: Secondary | ICD-10-CM | POA: Diagnosis not present

## 2017-12-07 DIAGNOSIS — R4189 Other symptoms and signs involving cognitive functions and awareness: Secondary | ICD-10-CM

## 2017-12-07 DIAGNOSIS — Z66 Do not resuscitate: Secondary | ICD-10-CM | POA: Diagnosis present

## 2017-12-07 DIAGNOSIS — E876 Hypokalemia: Secondary | ICD-10-CM | POA: Diagnosis present

## 2017-12-07 DIAGNOSIS — R404 Transient alteration of awareness: Secondary | ICD-10-CM | POA: Diagnosis not present

## 2017-12-07 DIAGNOSIS — F329 Major depressive disorder, single episode, unspecified: Secondary | ICD-10-CM | POA: Diagnosis present

## 2017-12-07 DIAGNOSIS — R5381 Other malaise: Secondary | ICD-10-CM | POA: Diagnosis not present

## 2017-12-07 DIAGNOSIS — Z7189 Other specified counseling: Secondary | ICD-10-CM

## 2017-12-07 DIAGNOSIS — Z515 Encounter for palliative care: Secondary | ICD-10-CM

## 2017-12-07 DIAGNOSIS — J9811 Atelectasis: Secondary | ICD-10-CM | POA: Diagnosis not present

## 2017-12-07 DIAGNOSIS — R609 Edema, unspecified: Secondary | ICD-10-CM | POA: Diagnosis not present

## 2017-12-07 DIAGNOSIS — M2550 Pain in unspecified joint: Secondary | ICD-10-CM | POA: Diagnosis not present

## 2017-12-07 DIAGNOSIS — G939 Disorder of brain, unspecified: Secondary | ICD-10-CM

## 2017-12-07 DIAGNOSIS — R1310 Dysphagia, unspecified: Secondary | ICD-10-CM | POA: Diagnosis not present

## 2017-12-07 DIAGNOSIS — R001 Bradycardia, unspecified: Secondary | ICD-10-CM | POA: Diagnosis not present

## 2017-12-07 DIAGNOSIS — G936 Cerebral edema: Secondary | ICD-10-CM | POA: Diagnosis not present

## 2017-12-07 DIAGNOSIS — R69 Illness, unspecified: Secondary | ICD-10-CM | POA: Diagnosis not present

## 2017-12-07 DIAGNOSIS — Z888 Allergy status to other drugs, medicaments and biological substances status: Secondary | ICD-10-CM

## 2017-12-07 DIAGNOSIS — G935 Compression of brain: Secondary | ICD-10-CM | POA: Diagnosis not present

## 2017-12-07 DIAGNOSIS — R9089 Other abnormal findings on diagnostic imaging of central nervous system: Secondary | ICD-10-CM

## 2017-12-07 DIAGNOSIS — R0902 Hypoxemia: Secondary | ICD-10-CM | POA: Diagnosis not present

## 2017-12-07 DIAGNOSIS — R4020 Unspecified coma: Secondary | ICD-10-CM | POA: Diagnosis not present

## 2017-12-07 LAB — BLOOD GAS, VENOUS
Acid-base deficit: 1.9 mmol/L (ref 0.0–2.0)
BICARBONATE: 21.6 mmol/L (ref 20.0–28.0)
O2 SAT: 57.1 %
PATIENT TEMPERATURE: 98.6
PO2 VEN: 32.8 mmHg (ref 32.0–45.0)
pCO2, Ven: 34.6 mmHg — ABNORMAL LOW (ref 44.0–60.0)
pH, Ven: 7.413 (ref 7.250–7.430)

## 2017-12-07 LAB — DIFFERENTIAL
BASOS ABS: 0 10*3/uL (ref 0.0–0.1)
BASOS PCT: 0 %
Eosinophils Absolute: 0.1 10*3/uL (ref 0.0–0.7)
Eosinophils Relative: 1 %
Lymphocytes Relative: 6 %
Lymphs Abs: 0.6 10*3/uL — ABNORMAL LOW (ref 0.7–4.0)
Monocytes Absolute: 0.4 10*3/uL (ref 0.1–1.0)
Monocytes Relative: 4 %
NEUTROS ABS: 8.8 10*3/uL — AB (ref 1.7–7.7)
NEUTROS PCT: 89 %

## 2017-12-07 LAB — I-STAT CHEM 8, ED
BUN: 4 mg/dL — ABNORMAL LOW (ref 8–23)
CALCIUM ION: 1.17 mmol/L (ref 1.15–1.40)
CHLORIDE: 111 mmol/L (ref 98–111)
Creatinine, Ser: 0.2 mg/dL — ABNORMAL LOW (ref 0.44–1.00)
Glucose, Bld: 85 mg/dL (ref 70–99)
HEMATOCRIT: 30 % — AB (ref 36.0–46.0)
Hemoglobin: 10.2 g/dL — ABNORMAL LOW (ref 12.0–15.0)
Potassium: 2.9 mmol/L — ABNORMAL LOW (ref 3.5–5.1)
SODIUM: 142 mmol/L (ref 135–145)
TCO2: 22 mmol/L (ref 22–32)

## 2017-12-07 LAB — COMPREHENSIVE METABOLIC PANEL
ALBUMIN: 3.2 g/dL — AB (ref 3.5–5.0)
ALT: 15 U/L (ref 0–44)
AST: 17 U/L (ref 15–41)
Alkaline Phosphatase: 64 U/L (ref 38–126)
Anion gap: 11 (ref 5–15)
BUN: 8 mg/dL (ref 8–23)
CHLORIDE: 114 mmol/L — AB (ref 98–111)
CO2: 22 mmol/L (ref 22–32)
Calcium: 8.8 mg/dL — ABNORMAL LOW (ref 8.9–10.3)
GLUCOSE: 90 mg/dL (ref 70–99)
Potassium: 2.9 mmol/L — ABNORMAL LOW (ref 3.5–5.1)
SODIUM: 147 mmol/L — AB (ref 135–145)
Total Bilirubin: 1.3 mg/dL — ABNORMAL HIGH (ref 0.3–1.2)
Total Protein: 6 g/dL — ABNORMAL LOW (ref 6.5–8.1)

## 2017-12-07 LAB — PROTIME-INR
INR: 1.04
Prothrombin Time: 13.5 seconds (ref 11.4–15.2)

## 2017-12-07 LAB — CBC
HEMATOCRIT: 35.3 % — AB (ref 36.0–46.0)
Hemoglobin: 11 g/dL — ABNORMAL LOW (ref 12.0–15.0)
MCH: 29.5 pg (ref 26.0–34.0)
MCHC: 31.2 g/dL (ref 30.0–36.0)
MCV: 94.6 fL (ref 78.0–100.0)
Platelets: 249 10*3/uL (ref 150–400)
RBC: 3.73 MIL/uL — ABNORMAL LOW (ref 3.87–5.11)
RDW: 21 % — AB (ref 11.5–15.5)
WBC: 9.9 10*3/uL (ref 4.0–10.5)

## 2017-12-07 LAB — I-STAT TROPONIN, ED: Troponin i, poc: 0 ng/mL (ref 0.00–0.08)

## 2017-12-07 LAB — APTT: APTT: 35 s (ref 24–36)

## 2017-12-07 LAB — I-STAT CG4 LACTIC ACID, ED: LACTIC ACID, VENOUS: 0.76 mmol/L (ref 0.5–1.9)

## 2017-12-07 LAB — CBG MONITORING, ED: Glucose-Capillary: 81 mg/dL (ref 70–99)

## 2017-12-07 MED ORDER — LACTATED RINGERS IV SOLN: INTRAVENOUS | Status: DC

## 2017-12-07 MED ORDER — METRONIDAZOLE IN NACL 5-0.79 MG/ML-% IV SOLN
500.0000 mg | Freq: Three times a day (TID) | INTRAVENOUS | Status: DC
  Administered 2017-12-07: 500 mg via INTRAVENOUS
  Filled 2017-12-07: qty 100

## 2017-12-07 MED ORDER — DEXAMETHASONE SODIUM PHOSPHATE 10 MG/ML IJ SOLN
10.0000 mg | Freq: Once | INTRAMUSCULAR | Status: AC
  Administered 2017-12-07: 10 mg via INTRAVENOUS
  Filled 2017-12-07: qty 1

## 2017-12-07 MED ORDER — POTASSIUM CHLORIDE 10 MEQ/100ML IV SOLN
10.0000 meq | INTRAVENOUS | Status: AC
  Administered 2017-12-07 – 2017-12-08 (×4): 10 meq via INTRAVENOUS
  Filled 2017-12-07 (×2): qty 100

## 2017-12-07 MED ORDER — KCL-LACTATED RINGERS 20 MEQ/L IV SOLN
INTRAVENOUS | Status: DC
  Administered 2017-12-07: via INTRAVENOUS
  Filled 2017-12-07 (×2): qty 1000

## 2017-12-07 MED ORDER — LACTATED RINGERS IV BOLUS
1000.0000 mL | Freq: Once | INTRAVENOUS | Status: AC
  Administered 2017-12-07: 1000 mL via INTRAVENOUS

## 2017-12-07 MED ORDER — VANCOMYCIN HCL 10 G IV SOLR
1250.0000 mg | INTRAVENOUS | Status: AC
  Administered 2017-12-07: 1250 mg via INTRAVENOUS
  Filled 2017-12-07: qty 1250

## 2017-12-07 MED ORDER — FLUCONAZOLE IN SODIUM CHLORIDE 400-0.9 MG/200ML-% IV SOLN
400.0000 mg | INTRAVENOUS | Status: DC
  Administered 2017-12-07 – 2017-12-08 (×2): 400 mg via INTRAVENOUS
  Filled 2017-12-07 (×2): qty 200

## 2017-12-07 MED ORDER — SODIUM CHLORIDE 0.9 % IV SOLN
2.0000 g | Freq: Once | INTRAVENOUS | Status: AC
  Administered 2017-12-07: 2 g via INTRAVENOUS
  Filled 2017-12-07: qty 2

## 2017-12-07 MED ORDER — ACETAMINOPHEN 650 MG RE SUPP: 650.0000 mg | Freq: Four times a day (QID) | RECTAL | Status: DC | PRN

## 2017-12-07 MED ORDER — MORPHINE SULFATE (PF) 2 MG/ML IV SOLN: 1.0000 mg | INTRAVENOUS | Status: DC | PRN

## 2017-12-07 MED ORDER — VANCOMYCIN HCL IN DEXTROSE 1-5 GM/200ML-% IV SOLN: 1000.0000 mg | Freq: Once | INTRAVENOUS | Status: DC

## 2017-12-07 MED ORDER — DEXAMETHASONE SODIUM PHOSPHATE 10 MG/ML IJ SOLN
10.0000 mg | Freq: Four times a day (QID) | INTRAMUSCULAR | Status: DC
  Administered 2017-12-07 – 2017-12-09 (×9): 10 mg via INTRAVENOUS
  Filled 2017-12-07 (×9): qty 1

## 2017-12-07 NOTE — ED Notes (Signed)
Unable to attain second set of blood cultures. RN will start IV antibiotics and fluids.

## 2017-12-07 NOTE — Progress Notes (Signed)
A consult was received from an ED physician for Vancomycin and Cefepime per pharmacy dosing.  The patient's profile has been reviewed for ht/wt/allergies/indication/available labs.    A one time order has been placed for Vancomycin 1250mg  IV and Cefepime 2g IV. Further antibiotics/pharmacy consults should be ordered by admitting physician if indicated.                       Thank you, Luiz Ochoa 12/07/2017  6:21 PM

## 2017-12-07 NOTE — ED Notes (Signed)
Bed: WA07 Expected date: 12/07/17 Expected time: 5:32 PM Means of arrival: Ambulance Comments: ? UTI

## 2017-12-07 NOTE — ED Triage Notes (Signed)
Patient comes from home. Patient brought in via [facility]. Patient daughter called 911 due to patient being unresponsive / altered mental status that daughter noticed this morning. Patient is baseline lethargic and at baseline can ambulate with 4-point walker and speak / AOx3. As of right now patient is non- responsive with reactive pupils bilaterally. Patient daughter at bedside. Patient does have hx of brain tumor.

## 2017-12-07 NOTE — ED Provider Notes (Signed)
Emergency Department Provider Note   I have reviewed the triage vital signs and the nursing notes.   HISTORY  Chief Complaint Altered Mental Status and Unresponsive   HPI Kelly Maxwell is a 72 y.o. female with multiple medical problems as documented below the presents to the emergency department today secondary to altered mental status.  Patient has a history of brain cancer for the last 6 years but at baseline gets around with a walker and able to converse however the last 2 days she had decreased intake taken [place] to likely thrush according to the family.  Today she was totally unresponsive which is absolutely abnormal for her.  Her vital signs have been within normal limits at home the husband checked him.  He gives her a liter of fluid every few days at home as well to a PICC line.  Patient is not able to offer any history.  Daughter states she often gets UTIs. No other associated or modifying symptoms.    Past Medical History:  Diagnosis Date  . Cancer ([place])   . Depression   . GERD (gastroesophageal reflux disease)   . 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 Headache(784.0)   . Hiatal hernia   . History of radiation therapy 01/07/2012-02/18/12   left temporal  58GY  . HLD (hyperlipidemia)   . PONV (postoperative nausea and vomiting)   . 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 Brain edema ([place]) 12/07/2017  . Hypoxia 11/26/2017  . Palliative care encounter 10/24/2017  . Dysphagia 10/24/2017  . Thrombocytopenia ([place]) 10/24/2017  . DM2 (diabetes mellitus, type 2) ([place]) 10/09/2017  . Unsteady gait 09/19/2017  . Weakness generalized 09/19/2017  . Counseling regarding advanced care planning and goals of care 09/19/2017  . HTN  (hypertension) 07/19/2017  . Acute metabolic encephalopathy 16/12/9602  . Dehydration 07/16/2017  . 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 Anxiety and depression 09/03/2015  . Personal history of other infectious and parasitic diseases 09/03/2015  . 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 HLD (hyperlipidemia)   . GERD (gastroesophageal reflux disease)   . PONV (postoperative nausea and vomiting)   . 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 Hiatal hernia     Past Surgical History:  Procedure Laterality Date  . BUNIONECTOMY  2001   right  . CATARACT EXTRACTION Left   . CRANIOTOMY  12/17/2011   Procedure: CRANIOTOMY TUMOR EXCISION;  Surgeon: Otilio Connors, MD;  Location: [facility];  Service: Neurosurgery;  Laterality: Left;  LEFT temporal craniotomy with stealth for tumor resection  . DILATION AND CURETTAGE OF UTERUS  2007  . TOTAL KNEE ARTHROPLASTY  2000   left  . VENTRAL HERNIA REPAIR N/A 09/03/2015   Procedure: LAPAROSCOPIC repair of incarcerated spigelian hernia with mesh, reduction and repair ;  Surgeon: [name], MD;  Location: [facility];  Service: General;  Laterality: N/A;      Allergies Ciprofloxacin; Codeine; Succinylcholine chloride; and Compazine [prochlorperazine edisylate]  Family History  Problem Relation Age of Onset  . Heart disease Mother     Social History Social History   Tobacco Use  . Smoking status: Never Smoker  . Smokeless tobacco: Never Used  Substance Use Topics  . Alcohol use: No  . Drug use: No    Review of Systems  All other systems negative except as documented in the HPI. All pertinent positives and negatives as reviewed in the HPI. ____________________________________________   PHYSICAL EXAM:  VITAL SIGNS: ED Triage Vitals  Enc Vitals Group     BP 12/07/17 1758 (!) 155/118     Pulse Rate 12/07/17 1758 79     Resp 12/07/17 1758 19     Temp 12/07/17 1751 (S) (!)  100.4 F (38 C)     Temp Source 12/07/17 1751 (S) Rectal     SpO2 12/07/17 1745 94 %     Weight 12/07/17 1752 143 lb 4.8 oz (65 kg)     Height 12/07/17 1752 5\' 4"  (1.626 m)    Constitutional: Ill appearing and in no acute distress. Eyes: Conjunctivae are normal. PERRL. EOMI. Head: Atraumatic. Nose: No congestion/rhinnorhea. Mouth/Throat: Mucous membranes are moist.  Oropharynx non-erythematous. Neck: No stridor.  No meningeal signs.   Cardiovascular: Normal rate, regular rhythm. Good peripheral circulation. Grossly normal heart sounds.   Respiratory: Normal respiratory effort.  No retractions. Lungs CTAB. Gastrointestinal: Soft and nontender. No distention.  Musculoskeletal: No lower extremity tenderness nor edema. No gross deformities of extremities. Neurologic:  GCS 9, pupils dilated and reactive.  Skin:  Skin is warm, dry and intact. Petechiae and multiple bruises noted.   ____________________________________________   LABS (all labs ordered are listed, but only abnormal results are displayed)  Labs Reviewed  CBC - Abnormal; Notable for the following components:      Result Value   RBC 3.73 (*)    Hemoglobin 11.0 (*)    HCT 35.3 (*)    RDW 21.0 (*)    All other components within normal limits  DIFFERENTIAL - Abnormal; Notable for the following components:   Neutro Abs 8.8 (*)    Lymphs Abs 0.6 (*)    All other components within normal limits  COMPREHENSIVE METABOLIC PANEL - Abnormal; Notable for the following components:   Sodium 147 (*)    Potassium 2.9 (*)    Chloride 114 (*)    Creatinine, Ser <0.30 (*)    Calcium 8.8 (*)    Total Protein 6.0 (*)    Albumin 3.2 (*)    Total Bilirubin 1.3 (*)    All other components within normal limits  BLOOD GAS, VENOUS - Abnormal; Notable for the following components:   pCO2, Ven 34.6 (*)    All other components within normal limits  I-STAT CHEM 8, ED - Abnormal; Notable for the following components:   Potassium 2.9 (*)      BUN 4 (*)    Creatinine, Ser 0.20 (*)    Hemoglobin 10.2 (*)    HCT 30.0 (*)    All other components within normal limits  CULTURE, BLOOD (ROUTINE X 2)  CULTURE, BLOOD (ROUTINE X 2)  PROTIME-INR  APTT  URINALYSIS, ROUTINE W REFLEX MICROSCOPIC  CBC  BASIC METABOLIC PANEL  I-STAT TROPONIN, ED  CBG MONITORING, ED  CBG MONITORING, ED  I-STAT CG4 LACTIC ACID, ED  I-STAT CG4 LACTIC ACID, ED   ____________________________________________  EKG   EKG Interpretation  Date/Time:  Saturday December 07 2017 18:31:40 EDT Ventricular Rate:  64 PR Interval:    QRS Duration: 95 QT Interval:  542 QTC Calculation: 560 R Axis:   -3 Text Interpretation:  Sinus rhythm Low voltage, precordial leads Nonspecific T abnormalities, anterior leads Prolonged QT interval  No significant change since last tracing Confirmed by Merrily Pew [phone]) on 12/07/2017 11:13:32 PM      ____________________________________________  RADIOLOGY  Ct Head Wo Contrast  Result Date: 12/07/2017 CLINICAL DATA:  Altered level of consciousness. Fall. History of gliosarcoma. EXAM: CT HEAD WITHOUT CONTRAST TECHNIQUE: Contiguous axial images were obtained from the base of the skull through the vertex without intravenous contrast. COMPARISON:  Head CT 10/08/2017 and MRI 08/27/2017 FINDINGS: Brain: There is markedly progressive, severe vasogenic edema throughout and centrally the entirety of the left cerebral hemisphere resulting in diffuse sulcal effacement, left lateral ventricle effacement, and 15 mm of rightward midline shift. There is mass effect on the midbrain. A new 5.5 x 3 cm mass is present in the anterior left basal ganglia region. There is new mild dilatation of the atrium and temporal horn of the right lateral ventricle likely from obstruction at the level of the foramina of [place]. No acute intracranial hemorrhage or acute cortically based infarct is identified. There is no extra-axial fluid collection. Vascular: Mild  calcified atherosclerosis at the skull base. Skull: Left perianal craniotomy.  No fracture. Sinuses/Orbits: Mild left sphenoid and posterior left ethmoid sinus mucosal thickening. Clear mastoid air cells. Left cataract extraction. Other: None. IMPRESSION: 1. Markedly increased, severe edema throughout the left cerebral hemisphere with new 5.5 cm left basal ganglia mass consistent with progression of known gliosarcoma. 2. 15 mm rightward midline shift with trapping of the right lateral ventricle. 3. No acute intracranial hemorrhage. Critical Value/emergent results were called by telephone at the time of interpretation on 12/07/2017 at 7:05 pm to Dr. Merrily Pew , who verbally acknowledged these results. Electronically Signed   By: Logan Bores M.D.   On: 12/07/2017 19:05   Dg Chest Portable 1 View  Result Date: 12/07/2017 CLINICAL DATA:  Unresponsive, altered mental status, baseline lethargy, history brain tumor EXAM: PORTABLE CHEST 1 VIEW COMPARISON:  Portable exam 1808 hours compared to 10/08/2017 FINDINGS: RIGHT arm PICC line tip projects over SVC. Normal heart size, mediastinal contours, and pulmonary vascularity. RIGHT basilar atelectasis. Question peripheral infiltrate RIGHT upper lobe. Remaining lungs clear. No pleural effusion or pneumothorax. IMPRESSION: RIGHT basilar atelectasis with question peripheral infiltrate in the RIGHT upper lobe, new since previous exam. Electronically Signed   By: Lavonia Dana M.D.   On: 12/07/2017 18:24    ____________________________________________   PROCEDURES  Procedure(s) performed:   Procedures  CRITICAL CARE Performed by: Merrily Pew Total critical care time: 35 minutes Critical care time was exclusive of separately billable procedures and treating other patients. Critical care was necessary to treat or prevent imminent or life-threatening deterioration. Critical care was time spent personally by me on the following activities: development of treatment  plan with patient and/or surrogate as well as nursing, discussions with consultants, evaluation of patient's response to treatment, examination of patient, obtaining history from patient or surrogate, ordering and performing treatments and interventions, ordering and review of laboratory studies, ordering and review of radiographic studies, pulse oximetry and re-evaluation of patient's condition.  ____________________________________________   INITIAL IMPRESSION / ASSESSMENT AND PLAN / ED COURSE  72 year old female with new/worsening left-sided brain mass with significant edema, ventricular compression and midline shift of approximately 0.5 cm.  Discussed with Dr. Trenton Gammon with neurosurgery states there is no surgical options for palliative or curative reasons.  Suggest hospice care.  Stated Decadron might help.  Discussed with hospitalist who will admit.  Made DNR with daughter who is the healthcare power of attorney at bedside.  Pertinent  labs & imaging results that were available during my care of the patient were reviewed by me and considered in my medical decision making (see chart for details).  ____________________________________________  FINAL CLINICAL IMPRESSION(S) / ED DIAGNOSES  Final diagnoses:  Unresponsive  Cerebral edema (HCC)  Midline shift of brain     MEDICATIONS GIVEN DURING THIS VISIT:  Medications  fluconazole (DIFLUCAN) IVPB 400 mg (400 mg Intravenous Transfusing/Transfer 12/07/17 2208)  morphine 2 MG/ML injection 1 mg (has no administration in time range)  dexamethasone (DECADRON) injection 10 mg (has no administration in time range)  acetaminophen (TYLENOL) suppository 650 mg (has no administration in time range)  potassium chloride 10 mEq in 100 mL IVPB (has no administration in time range)  lactated ringers with KCl 20 mEq/L infusion (has no administration in time range)  ceFEPIme (MAXIPIME) 2 g in sodium chloride 0.9 % 100 mL IVPB (0 g Intravenous Stopped  12/07/17 1935)  vancomycin (VANCOCIN) 1,250 mg in sodium chloride 0.9 % 250 mL IVPB (1,250 mg Intravenous New Bag/Given 12/07/17 2049)  lactated ringers bolus 1,000 mL (0 mLs Intravenous Stopped 12/07/17 2050)  dexamethasone (DECADRON) injection 10 mg (10 mg Intravenous Given 12/07/17 2005)     NEW OUTPATIENT MEDICATIONS STARTED DURING THIS VISIT:  Current Discharge Medication List      Note:  This note was prepared with assistance of Dragon voice recognition software. Occasional wrong-word or sound-a-like substitutions may have occurred due to the inherent limitations of voice recognition software.   Merrily Pew, MD 12/07/17 [phone]

## 2017-12-07 NOTE — ED Notes (Signed)
ED TO INPATIENT HANDOFF REPORT  Name/Age/Gender Kelly Maxwell 72 y.o. female  Code Status    Code Status Orders  (From admission, onward)         Start     Ordered   12/07/17 2057  Do not attempt resuscitation (DNR)  Continuous    Question Answer Comment  In the event of cardiac or respiratory ARREST Do not call a "code blue"   In the event of cardiac or respiratory ARREST Do not perform Intubation, CPR, defibrillation or ACLS   In the event of cardiac or respiratory ARREST Use medication by any route, position, wound care, and other measures to relive pain and suffering. May use oxygen, suction and manual treatment of airway obstruction as needed for comfort.      12/07/17 2100        Code Status History    Date Active Date Inactive Code Status Order ID Comments User Context   12/07/2017 2015 12/07/2017 2100 DNR [id number]  Merrily Pew, MD ED   10/24/2017 2328 10/31/2017 1912 Full Code [id number]  Vianne Bulls, MD ED   10/09/2017 0111 10/11/2017 1705 Full Code [id number]  [name], DO ED   07/16/2017 2238 07/19/2017 2154 Full Code [id number]  [name], DO Inpatient   09/03/2015 0605 09/06/2015 1440 Full Code [id number]  [name], MD Inpatient      Home/SNF/Other Home  Chief Complaint ?UTI  Level of Care/Admitting Diagnosis ED Disposition    ED Disposition Condition Comment   Admit  Hospital Area: [facility] [[id number]]  Level of Care: Telemetry [5]  Admit to tele based on following criteria: Other see comments  Comments: monitor for sudden event given extensive brain edema  Diagnosis: Brain edema [facility]) [[id number]]  Admitting Physician: Colbert Ewing [[id number]]  Attending Physician: Colbert Ewing [[id number]]  Estimated length of stay: past midnight tomorrow  Certification:: I certify this patient will need inpatient services for at least 2 midnights  PT Class (Do Not Modify): Inpatient [101]  PT Acc Code (Do Not Modify): Private [1]        Medical History Past Medical History:  Diagnosis Date  . Cancer ([place])   . Depression   . GERD (gastroesophageal reflux disease)   . 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 Headache(784.0)   . Hiatal hernia   . History of radiation therapy 01/07/2012-02/18/12   left temporal  60GY  . HLD (hyperlipidemia)   . PONV (postoperative nausea and vomiting)   . 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 Ciprofloxacin     AMS  . Codeine Nausea And Vomiting and Nausea Only  . 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    . Compazine [Prochlorperazine Edisylate] Other (See Comments) and Rash    Became hyper Became hyper    IV Location/Drains/Wounds Patient Lines/Drains/Airways Status   Active Line/Drains/Airways    Name:   Placement date:   Placement time:   Site:   Days:   Peripheral IV 12/07/17 Right Wrist   12/07/17    1858    Wrist   less than 1   PICC Single Lumen 10/31/17 PICC Right Brachial 38 cm 0 cm   10/31/17  1117    Brachial   37          Labs/Imaging Results for orders placed or performed during the hospital encounter of 12/07/17 (from the past 48 hour(s))  Protime-INR     Status: None   Collection Time: 12/07/17  5:53 PM  Result Value Ref Range   Prothrombin Time 13.5 11.4 - 15.2 seconds   INR 1.04     Comment: Performed at [facility], [place] [address], [place], [place] [address]  APTT     Status: None   Collection Time: 12/07/17  5:53 PM  Result Value Ref Range   aPTT 35 24 - 36 seconds    Comment: Performed at [facility], [place] [address] [place], [place] [address]  CBC     Status: Abnormal   Collection Time: 12/07/17   5:53 PM  Result Value Ref Range   WBC 9.9 4.0 - 10.5 K/uL   RBC 3.73 (L) 3.87 - 5.11 MIL/uL   Hemoglobin 11.0 (L) 12.0 - 15.0 g/dL   HCT 35.3 (L) 36.0 - 46.0 %   MCV 94.6 78.0 - 100.0 fL   MCH 29.5 26.0 - 34.0 pg   MCHC 31.2 30.0 - 36.0 g/dL   RDW 21.0 (H) 11.5 - 15.5 %   Platelets 249 150 - 400 K/uL    Comment: Performed at [facility], [place] [address], [place], [place] [address]  Differential     Status: Abnormal   Collection Time: 12/07/17  5:53 PM  Result Value Ref Range   Neutrophils Relative % 89 %   Neutro Abs 8.8 (H) 1.7 - 7.7 K/uL   Lymphocytes Relative 6 %   Lymphs Abs 0.6 (L) 0.7 - 4.0 K/uL   Monocytes Relative 4 %   Monocytes Absolute 0.4 0.1 - 1.0 K/uL   Eosinophils Relative 1 %   Eosinophils Absolute 0.1 0.0 - 0.7 K/uL   Basophils Relative 0 %   Basophils Absolute 0.0 0.0 - 0.1 K/uL    Comment: Performed at [facility], [place] [address], [place], [place] [address]  Comprehensive metabolic panel     Status: Abnormal   Collection Time: 12/07/17  5:53 PM  Result Value Ref Range   Sodium 147 (H) 135 - 145 mmol/L   Potassium 2.9 (L) 3.5 - 5.1 mmol/L   Chloride 114 (H) 98 - 111 mmol/L   CO2 22 22 - 32 mmol/L   Glucose, Bld 90 70 - 99 mg/dL   BUN 8 8 - 23 mg/dL   Creatinine, Ser <0.30 (L) 0.44 - 1.00 mg/dL   Calcium 8.8 (L) 8.9 - 10.3 mg/dL   Total Protein 6.0 (L) 6.5 - 8.1 g/dL   Albumin 3.2 (L) 3.5 - 5.0 g/dL   AST 17 15 - 41 U/L   ALT 15 0 - 44 U/L   Alkaline Phosphatase 64 38 - 126 U/L   Total Bilirubin 1.3 (H) 0.3 - 1.2 mg/dL   GFR calc non Af Amer NOT CALCULATED >60 mL/min   GFR calc Af Amer NOT CALCULATED >60 mL/min    Comment: (NOTE) The eGFR has been calculated using the CKD EPI equation. This calculation has not been validated in all clinical situations. eGFR's persistently <60 mL/min signify possible Chronic Kidney Disease.    Anion gap 11 5 - 15    Comment: Performed at [facility], [place]  [address], [place], [place] [address]  CBG monitoring, ED  Status: None   Collection Time: 12/07/17  6:34 PM  Result Value Ref Range   Glucose-Capillary 81 70 - 99 mg/dL   Comment 1 Notify RN    Comment 2 Document in Chart   Blood gas, venous     Status: Abnormal   Collection Time: 12/07/17  6:34 PM  Result Value Ref Range   pH, Ven 7.413 7.250 - 7.430   pCO2, Ven 34.6 (L) 44.0 - 60.0 mmHg   pO2, Ven 32.8 32.0 - 45.0 mmHg   Bicarbonate 21.6 20.0 - 28.0 mmol/L   Acid-base deficit 1.9 0.0 - 2.0 mmol/L   O2 Saturation 57.1 %   Patient temperature 98.6    Collection site VEIN    Drawn by DRAWN BY RN    Sample type VEIN     Comment: Performed at [facility] [address] [place], [place] [address]  I-stat troponin, ED     Status: None   Collection Time: 12/07/17  6:45 PM  Result Value Ref Range   Troponin i, poc 0.00 0.00 - 0.08 ng/mL   Comment 3            Comment: Due to the release kinetics of cTnI, a negative result within the first hours of the onset of symptoms does not rule out myocardial infarction with certainty. If myocardial infarction is still suspected, repeat the test at appropriate intervals.   I-Stat Chem 8, ED     Status: Abnormal   Collection Time: 12/07/17  6:47 PM  Result Value Ref Range   Sodium 142 135 - 145 mmol/L   Potassium 2.9 (L) 3.5 - 5.1 mmol/L   Chloride 111 98 - 111 mmol/L   BUN 4 (L) 8 - 23 mg/dL   Creatinine, Ser 0.20 (L) 0.44 - 1.00 mg/dL   Glucose, Bld 85 70 - 99 mg/dL   Calcium, Ion 1.17 1.15 - 1.40 mmol/L   TCO2 22 22 - 32 mmol/L   Hemoglobin 10.2 (L) 12.0 - 15.0 g/dL   HCT 30.0 (L) 36.0 - 46.0 %  I-Stat CG4 Lactic Acid, ED  (not at  [facility])     Status: None   Collection Time: 12/07/17  6:47 PM  Result Value Ref Range   Lactic Acid, Venous 0.76 0.5 - 1.9 mmol/L   Ct Head Wo Contrast  Result Date: 12/07/2017 CLINICAL DATA:  Altered level of consciousness. Fall. History of gliosarcoma. EXAM: CT HEAD WITHOUT  CONTRAST TECHNIQUE: Contiguous axial images were obtained from the base of the skull through the vertex without intravenous contrast. COMPARISON:  Head CT 10/08/2017 and MRI 08/27/2017 FINDINGS: Brain: There is markedly progressive, severe vasogenic edema throughout and centrally the entirety of the left cerebral hemisphere resulting in diffuse sulcal effacement, left lateral ventricle effacement, and 15 mm of rightward midline shift. There is mass effect on the midbrain. A new 5.5 x 3 cm mass is present in the anterior left basal ganglia region. There is new mild dilatation of the atrium and temporal horn of the right lateral ventricle likely from obstruction at the level of the foramina of [place]. No acute intracranial hemorrhage or acute cortically based infarct is identified. There is no extra-axial fluid collection. Vascular: Mild calcified atherosclerosis at the skull base. Skull: Left perianal craniotomy.  No fracture. Sinuses/Orbits: Mild left sphenoid and posterior left ethmoid sinus mucosal thickening. Clear mastoid air cells. Left cataract extraction. Other: None. IMPRESSION: 1. Markedly increased, severe edema throughout the left cerebral hemisphere with new 5.5 cm  left basal ganglia mass consistent with progression of known gliosarcoma. 2. 15 mm rightward midline shift with trapping of the right lateral ventricle. 3. No acute intracranial hemorrhage. Critical Value/emergent results were called by telephone at the time of interpretation on 12/07/2017 at 7:05 pm to Dr. Merrily Pew , who verbally acknowledged these results. Electronically Signed   By: Logan Bores M.D.   On: 12/07/2017 19:05   Dg Chest Portable 1 View  Result Date: 12/07/2017 CLINICAL DATA:  Unresponsive, altered mental status, baseline lethargy, history brain tumor EXAM: PORTABLE CHEST 1 VIEW COMPARISON:  Portable exam 1808 hours compared to 10/08/2017 FINDINGS: RIGHT arm PICC line tip projects over SVC. Normal heart size, mediastinal  contours, and pulmonary vascularity. RIGHT basilar atelectasis. Question peripheral infiltrate RIGHT upper lobe. Remaining lungs clear. No pleural effusion or pneumothorax. IMPRESSION: RIGHT basilar atelectasis with question peripheral infiltrate in the RIGHT upper lobe, new since previous exam. Electronically Signed   By: Lavonia Dana M.D.   On: 12/07/2017 18:24    Pending Labs Unresulted Labs (From admission, onward)    Start     Ordered   12/08/17 0500  CBC  Tomorrow morning,   R     12/07/17 2100   12/07/17 1811  Blood Culture (routine x 2)  BLOOD CULTURE X 2,   STAT     12/07/17 1811   12/07/17 1811  Urinalysis, Routine w reflex microscopic  STAT,   STAT     12/07/17 1811          Vitals/Pain Today's Vitals   12/07/17 2000 12/07/17 2015 12/07/17 2030 12/07/17 2045  BP: (!) 162/74 (!) 159/76 (!) 157/80 (!) 154/71  Pulse: 63 65 (!) 103 62  Resp: 19 20 (!) 22 20  Temp:      TempSrc:      SpO2: 99% 98% (!) 78% 98%  Weight:      Height:      PainSc:        Isolation Precautions No active isolations  Medications Medications  vancomycin (VANCOCIN) 1,250 mg in sodium chloride 0.9 % 250 mL IVPB (1,250 mg Intravenous New Bag/Given 12/07/17 2049)  fluconazole (DIFLUCAN) IVPB 400 mg (has no administration in time range)  morphine 2 MG/ML injection 1 mg (has no administration in time range)  dexamethasone (DECADRON) injection 10 mg (has no administration in time range)  acetaminophen (TYLENOL) suppository 650 mg (has no administration in time range)  ceFEPIme (MAXIPIME) 2 g in sodium chloride 0.9 % 100 mL IVPB (0 g Intravenous Stopped 12/07/17 1935)  lactated ringers bolus 1,000 mL (0 mLs Intravenous Stopped 12/07/17 2050)  dexamethasone (DECADRON) injection 10 mg (10 mg Intravenous Given 12/07/17 2005)    Mobility non-ambulatory

## 2017-12-07 NOTE — H&P (Signed)
History and Physical  Kelly Maxwell VVZ:[id number] DOB: 31-Jan-1946 DOA: 12/07/2017 1736  Referring physician: Dayna Barker (ED) PCP: Wenda Low, MD  Outpatient Specialists: Merrilyn Puma (neuro-oncology) and followed by palliative care Gonzella Lex)  HISTORY   Chief Complaint: altered mentation/ somnolence  HPI: Kelly Maxwell is a 72 y.o. female with hx left temporal lobe gliosarcoma s/p surgery, XRT and chemo in 2013-2015 who presented with acute somnolence noted by family members x 2 days. Patient's daughter noted that about 2 weeks ago, she had a fall while ambulating towards her right side, which is unusual for her, and then otherwise for the past 2 weeks had subtle increase in fatigue. However day prior to admission, patient was barely responsive and then this morning was completely unresponsive while in bed. Her prior baseline: she was able to use a walker and have normal conversation. Patient is unresponsive on my exam and unable to provide history. CT head in ED showed severe L cerebral edema with new large 5.5 cm mass and 40mm rightward midline shift. No hemorrhage.  Daughter Kelly Maxwell) is the healthcare POA and confirmed with ED and myself that patient's code status has been updated to DNR/DNI. The daughter and patient's husband both are understandably in shock regarding the head CT results. Neurosurgery was contacted by ED -- recommended no surgical intervention and palliative care.     Review of Systems:  + increasing lethargy per family x 2 weeks and new somnolence Rest of systems reviewed are negative, except as per above history.   ED course:  Vitals Blood pressure (!) 154/71, pulse 62, temperature 98.1 F (36.7 C), initial temp 100.76F, resp. rate 20, height (S) 5\' 4"  (1.626 m), weight (S) 65 kg, SpO2 98 %. Received decradon 10mg  I x 1; cefepime 2gm, flagyl 500mg  IV x 1; vancomycin 1.25 gram x1; LR x 1L bolus; diflucan 400mg  IV   Past Medical History:  Diagnosis Date  . Cancer  ([place])   . Depression   . GERD (gastroesophageal reflux disease)   . Glial neoplasm of brain ([place]) 12/17/11   left temporal, grade IV, 2.7 x 3.6 x 2.5 cm ring enhancing; numerous small surrounding satellite lesions 12/13/11  . Headache(784.0)   . Hiatal hernia   . History of radiation therapy 01/07/2012-02/18/12   left temporal  58GY  . HLD (hyperlipidemia)   . PONV (postoperative nausea and vomiting)   . 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 BUNIONECTOMY  2001   right  . CATARACT EXTRACTION Left   . CRANIOTOMY  12/17/2011   Procedure: CRANIOTOMY TUMOR EXCISION;  Surgeon: Otilio Connors, MD;  Location: [facility];  Service: Neurosurgery;  Laterality: Left;  LEFT temporal craniotomy with stealth for tumor resection  . DILATION AND CURETTAGE OF UTERUS  2007  . TOTAL KNEE ARTHROPLASTY  2000   left  . VENTRAL HERNIA REPAIR N/A 09/03/2015   Procedure: LAPAROSCOPIC repair of incarcerated spigelian hernia with mesh, reduction and repair ;  Surgeon: [name], MD;  Location: [facility];  Service: General;  Laterality: N/A;    Social History:  reports that she has never smoked. She has never used smokeless tobacco. She reports that she does not drink alcohol or use drugs.  Allergies  Allergen Reactions  . Ciprofloxacin     AMS  . Codeine Nausea And Vomiting and Nausea Only  . Succinylcholine Chloride  Other (See Comments)    Pseudocholinesterase Deficiency - Required post-op ventilation.    . Compazine [Prochlorperazine Edisylate] Other (See Comments) and Rash    Became hyper Became hyper    Family History  Problem Relation Age of Onset  . Heart disease Mother       Prior to Admission medications   Medication Sig Start Date End Date Taking? Authorizing Provider  amLODipine (NORVASC) 5 MG tablet Take 1 tablet (5 mg total) by mouth daily. 08/26/17   Vaslow,  Acey Lav, MD  amphetamine-dextroamphetamine (ADDERALL) 30 MG tablet Take 1 tablet by mouth daily. 11/19/17   Ventura Sellers, MD  atorvastatin (LIPITOR) 40 MG tablet Take 40 mg by mouth daily.     [provider]  bisacodyl (DULCOLAX) 5 MG EC tablet Take 5 mg by mouth daily.    [provider]  Steva Colder Extract POWD Take 1 packet by mouth daily.     [provider]  Bromfenac Sodium (PROLENSA) 0.07 % SOLN Place 1 drop into the left eye daily. Reported on 09/02/2015     [provider]  cholecalciferol (VITAMIN D) 1000 units tablet Take 1 tablet (1,000 Units total) by mouth daily. 06/20/17   Vaslow, Acey Lav, MD  clonazePAM (KLONOPIN) 0.5 MG tablet Take 1 tablet (0.5 mg total) by mouth at bedtime as needed for anxiety. Patient not taking: Reported on 10/24/2017 07/19/17   Eugenie Filler, MD  dexamethasone (DECADRON) 2 MG tablet Take 1 tablet (2 mg total) by mouth daily. 08/09/17   Ventura Sellers, MD  Diaper Rash Products ([employer] DIAPER) 1-10 % CREA Apply 1 application topically as needed (chaffing).  09/06/17   [provider]  Difluprednate (DUREZOL) 0.05 % EMUL Place 1 drop into the left eye 2 (two) times daily.     [provider]  feeding supplement, ENSURE ENLIVE, (ENSURE ENLIVE) LIQD Take 237 mLs by mouth 2 (two) times daily between meals. Patient not taking: Reported on 11/19/2017 10/31/17   Raiford Noble Latif, DO  GLEOSTINE 10 MG capsule Take 10 mg by mouth daily. 09/26/17   [provider]  GLEOSTINE 100 MG capsule Take 100 mg by mouth as directed.  09/26/17   [provider]  GLEOSTINE 40 MG capsule Take 40 mg by mouth daily. 09/26/17   [provider]  lacosamide (VIMPAT) 50 MG TABS tablet Take 1 tablet (50 mg total) by mouth 2 (two) times daily. 06/24/17   Ventura Sellers, MD  magic mouthwash w/lidocaine SOLN Take 5 mLs by mouth 4 (four) times daily as needed for mouth pain. Patient not taking:  Reported on 11/19/2017 10/31/17   Raiford Noble Latif, DO  Melatonin 5 MG CAPS Take 10-20 mg by mouth at bedtime.     [provider]  metFORMIN (GLUCOPHAGE) 500 MG tablet Take 500 mg by mouth 2 (two) times daily with a meal.     [provider]  NON FORMULARY Take 1 capsule by mouth 2 (two) times daily. Lazarus Natural CBD Oil     [provider]  nystatin (MYCOSTATIN) 100000 UNIT/ML suspension Take 5 mLs by mouth 4 (four) times daily. Swish and spit out 10/24/17   [provider]  nystatin cream (MYCOSTATIN) Apply 1 application topically as needed for dry skin.  08/21/17   [provider]  ondansetron (ZOFRAN) 8 MG tablet Take 1 tablets (8mg ) by mouth once 30-60 prior to lomustine and Q 8h prn N/V Patient not taking: Reported on 11/19/2017  09/25/17   Ventura Sellers, MD  pantoprazole (PROTONIX) 40 MG tablet Take 1 tablet (40 mg total) by mouth daily. 08/09/17   Vaslow, Acey Lav, MD  senna-docusate (SENOKOT-S) 8.6-50 MG tablet Take 1 tablet by mouth at bedtime as needed for mild constipation. 07/19/17   Eugenie Filler, MD    PHYSICAL EXAM   Temp:  [98.1 F (36.7 C)-100.4 F (38 C)] 98.1 F (36.7 C) (10/05 1758) Pulse Rate:  [45-103] 62 (10/05 2045) Cardiac Rhythm: Normal sinus rhythm (10/05 1745) Resp:  [15-22] 20 (10/05 2045) BP: (154-171)/(71-118) 154/71 (10/05 2045) SpO2:  [78 %-99 %] 98 % (10/05 2045) Weight:  [65 kg] 65 kg (10/05 1815)  BP (!) 154/71   Pulse 62   Temp 98.1 F (36.7 C) (Axillary)   Resp 20   Ht (S) 5\' 4"  (1.626 m)   Wt (S) 65 kg   SpO2 98%   BMI 24.60 kg/m    GEN well-nourished elderly caucasian female; somnolent and unresponsive to loud voice; minimally responsive to sternal rub  HEENT NCAT (well healed L craniotomy scar); pupils are dilated and minimally responsive, leftward gaze and disconjugate gaze; clear oropharynx, no cervical LAD; moist mucus membranes  JVP estimated 6 cm H2O above RA; no HJR ; no carotid  bruits b/l ;  CV regular normal rate; normal S1 and S2; no m/r/g or S3/S4; no parasternal heave  RESP CTA b/l anteriorly; breathing unlabored and symmetric  ABD soft NT ND +normoactive BS  EXT warm throughout b/l; no peripheral edema b/l  PULSES  DP and radials intact 2+ b/l  SKIN/MSK scattered purpura on upper chest and arms  NEURO/PSYCH  Unresponsive; AAOx0; abnormal pupil exam as above; did not test corneal or oculocephalic reflexes at this time   DATA   LABS ON ADMISSION:  Basic Metabolic Panel: Recent Labs  Lab 12/07/17 1753 12/07/17 1847  NA 147* 142  K 2.9* 2.9*  CL 114* 111  CO2 22  --   GLUCOSE 90 85  BUN 8 4*  CREATININE <0.30* 0.20*  CALCIUM 8.8*  --    CBC: Recent Labs  Lab 12/07/17 1753 12/07/17 1847  WBC 9.9  --   NEUTROABS 8.8*  --   HGB 11.0* 10.2*  HCT 35.3* 30.0*  MCV 94.6  --   PLT 249  --    Liver Function Tests: Recent Labs  Lab 12/07/17 1753  AST 17  ALT 15  ALKPHOS 64  BILITOT 1.3*  PROT 6.0*  ALBUMIN 3.2*   No results for input(s): LIPASE, AMYLASE in the last 168 hours. No results for input(s): AMMONIA in the last 168 hours. Coagulation:  Lab Results  Component Value Date   INR 1.04 12/07/2017   No results found for: PTT Lactic Acid, Venous:     Component Value Date/Time   LATICACIDVEN 0.76 12/07/2017 1847   Cardiac Enzymes: No results for input(s): CKTOTAL, CKMB, CKMBINDEX, TROPONINI in the last 168 hours. Urinalysis:    Component Value Date/Time   COLORURINE YELLOW 10/30/2017 0027   APPEARANCEUR HAZY (A) 10/30/2017 0027   LABSPEC 1.008 10/30/2017 0027   PHURINE 8.0 10/30/2017 0027   GLUCOSEU NEGATIVE 10/30/2017 0027   HGBUR NEGATIVE 10/30/2017 0027   BILIRUBINUR NEGATIVE 10/30/2017 0027   KETONESUR NEGATIVE 10/30/2017 0027   PROTEINUR NEGATIVE 10/30/2017 0027   NITRITE NEGATIVE 10/30/2017 0027   LEUKOCYTESUR LARGE (A) 10/30/2017 0027    BNP (last 3 results) No results for input(s): PROBNP in the last 8760  hours.  CBG: Recent Labs  Lab 12/07/17 1834  GLUCAP 81    Radiological Exams on Admission: Ct Head Wo Contrast  Result Date: 12/07/2017 CLINICAL DATA:  Altered level of consciousness. Fall. History of gliosarcoma. EXAM: CT HEAD WITHOUT CONTRAST TECHNIQUE: Contiguous axial images were obtained from the base of the skull through the vertex without intravenous contrast. COMPARISON:  Head CT 10/08/2017 and MRI 08/27/2017 FINDINGS: Brain: There is markedly progressive, severe vasogenic edema throughout and centrally the entirety of the left cerebral hemisphere resulting in diffuse sulcal effacement, left lateral ventricle effacement, and 15 mm of rightward midline shift. There is mass effect on the midbrain. A new 5.5 x 3 cm mass is present in the anterior left basal ganglia region. There is new mild dilatation of the atrium and temporal horn of the right lateral ventricle likely from obstruction at the level of the foramina of [place]. No acute intracranial hemorrhage or acute cortically based infarct is identified. There is no extra-axial fluid collection. Vascular: Mild calcified atherosclerosis at the skull base. Skull: Left perianal craniotomy.  No fracture. Sinuses/Orbits: Mild left sphenoid and posterior left ethmoid sinus mucosal thickening. Clear mastoid air cells. Left cataract extraction. Other: None. IMPRESSION: 1. Markedly increased, severe edema throughout the left cerebral hemisphere with new 5.5 cm left basal ganglia mass consistent with progression of known gliosarcoma. 2. 15 mm rightward midline shift with trapping of the right lateral ventricle. 3. No acute intracranial hemorrhage. Critical Value/emergent results were called by telephone at the time of interpretation on 12/07/2017 at 7:05 pm to Dr. Merrily Pew , who verbally acknowledged these results. Electronically Signed   By: Logan Bores M.D.   On: 12/07/2017 19:05   Dg Chest Portable 1 View  Result Date: 12/07/2017 CLINICAL DATA:   Unresponsive, altered mental status, baseline lethargy, history brain tumor EXAM: PORTABLE CHEST 1 VIEW COMPARISON:  Portable exam 1808 hours compared to 10/08/2017 FINDINGS: RIGHT arm PICC line tip projects over SVC. Normal heart size, mediastinal contours, and pulmonary vascularity. RIGHT basilar atelectasis. Question peripheral infiltrate RIGHT upper lobe. Remaining lungs clear. No pleural effusion or pneumothorax. IMPRESSION: RIGHT basilar atelectasis with question peripheral infiltrate in the RIGHT upper lobe, new since previous exam. Electronically Signed   By: Lavonia Dana M.D.   On: 12/07/2017 18:24    EKG: Independently reviewed. NSR with nonspecific ST changes anterior precordial leads    ASSESSMENT AND PLAN   Assessment: LAURI PURDUM is a 72 y.o. female with hx of gliosarcoma diagnosed 2013 who presented with acute somnolence -- unfortunately found to have likely recurrence of glial tumor with significant severe vasogenic brain edema with 16mm midline shift. I had a discussion with the daughter (healthcare POA) who has recently changed her mother's code status to DNR/DNI. At this time no feasible medical or surgical intervention would likely affect her course. Review of her prior cancer treatments show tumor progression was refractory to radiation, temodar,and avastin and that her neuro-oncologist had previously recommended hospice services. Therefore recommend transition to hospice care with urgent palliative care consultation while inpatient. The family members are still processing this information and from my discussion with the patient's husband, appears to be hesitant regarding hospice care although her daughter is receptive. Previous encounters with palliative care services have documented that up until yesterday, family had maintained full code status -- of note, as mentioned above she is no longer full code. We will continue IV decadron and touch base with her neuro-oncologist. Low grade  fever likely neurogenic.  Active Problems:   Brain edema (HCC)   Plan:   # Severe brain edema with midline shift in setting of suspected gliosarcoma recurrence  > Somnolent on exam. CT head showing 5.5 cm L mass, severe L sided edema, and rightward midline shift 48mm. Neurosurgery recommends no surgical intervention  - on telemetry given acute neurological event  - decadron 10mg  IV q6h  - rectal tylenol prn fever  - IV morphine prn pain - consulted palliative care for hospice discussion and services - day team to contact neuro-oncology Mickeal Skinner) tomorrow for any care updates - continue maintenance IV fluids LR at 75 cc/hr with K 85mEq/L - would hold off abx at this time  # Hypokalemia K 2.9 likely due to poor nutrition  - IV K repletion with 10mEq overnight  - continue 41meq K rider with maintenance LR  # Concern for thrush per family - diflucan IV for thrush (can likely be discontinued tomorrow)     DVT Prophylaxis: not indicated given palliative care goals Code Status:  DNR Family Communication: daughter Kelly Maxwell, [place]) at bedside  Disposition Plan: admit to inpatient awaiting palliative care/hospice transition  Patient contact: Extended Emergency Contact Information Primary Emergency Contact: [facility] Address: [address]          [place], [place] [address] [place] Phone: [phone] Relation: Spouse Secondary Emergency Contact: Maxwell,Kelly Address: [address]          [place], [place] [address] [place] Phone: [phone] Mobile Phone: [phone] Relation: Daughter  Time spent: > 35 minutes  Colbert Ewing, MD [facility] Pager [phone]  If 7PM-7AM, please contact night-coverage [email or web address] Password [email or web address] 12/07/2017, 9:05 PM

## 2017-12-07 NOTE — ED Notes (Signed)
ED Provider at bedside. 

## 2017-12-08 DIAGNOSIS — R9089 Other abnormal findings on diagnostic imaging of central nervous system: Secondary | ICD-10-CM

## 2017-12-08 DIAGNOSIS — Z7189 Other specified counseling: Secondary | ICD-10-CM

## 2017-12-08 DIAGNOSIS — Z515 Encounter for palliative care: Secondary | ICD-10-CM

## 2017-12-08 DIAGNOSIS — G939 Disorder of brain, unspecified: Secondary | ICD-10-CM

## 2017-12-08 DIAGNOSIS — R4189 Other symptoms and signs involving cognitive functions and awareness: Secondary | ICD-10-CM

## 2017-12-08 LAB — CBC
HEMATOCRIT: 34.7 % — AB (ref 36.0–46.0)
Hemoglobin: 10.8 g/dL — ABNORMAL LOW (ref 12.0–15.0)
MCH: 29.8 pg (ref 26.0–34.0)
MCHC: 31.1 g/dL (ref 30.0–36.0)
MCV: 95.9 fL (ref 78.0–100.0)
PLATELETS: 222 10*3/uL (ref 150–400)
RBC: 3.62 MIL/uL — ABNORMAL LOW (ref 3.87–5.11)
RDW: 20.8 % — AB (ref 11.5–15.5)
WBC: 9.7 10*3/uL (ref 4.0–10.5)

## 2017-12-08 LAB — BASIC METABOLIC PANEL
Anion gap: 11 (ref 5–15)
BUN: 9 mg/dL (ref 8–23)
CO2: 20 mmol/L — ABNORMAL LOW (ref 22–32)
Calcium: 8.8 mg/dL — ABNORMAL LOW (ref 8.9–10.3)
Chloride: 111 mmol/L (ref 98–111)
Creatinine, Ser: 0.34 mg/dL — ABNORMAL LOW (ref 0.44–1.00)
GFR calc Af Amer: >60 mL/min (ref >60)
Glucose, Bld: 125 mg/dL — ABNORMAL HIGH (ref 70–99)
POTASSIUM: 4.3 mmol/L (ref 3.5–5.1)
Sodium: 142 mmol/L (ref 135–145)

## 2017-12-08 LAB — URINALYSIS, ROUTINE W REFLEX MICROSCOPIC
BILIRUBIN URINE: NEGATIVE
Glucose, UA: NEGATIVE mg/dL
Hgb urine dipstick: NEGATIVE
KETONES UR: 80 mg/dL — AB
Leukocytes, UA: NEGATIVE
NITRITE: NEGATIVE
PH: 5 (ref 5.0–8.0)
Protein, ur: NEGATIVE mg/dL
Specific Gravity, Urine: 1.013 (ref 1.005–1.030)

## 2017-12-08 LAB — GLUCOSE, CAPILLARY
GLUCOSE-CAPILLARY: 99 mg/dL (ref 70–99)
GLUCOSE-CAPILLARY: 105 mg/dL — AB (ref 70–99)
GLUCOSE-CAPILLARY: 120 mg/dL — AB (ref 70–99)

## 2017-12-08 MED ORDER — POTASSIUM CHLORIDE 2 MEQ/ML IV SOLN
INTRAVENOUS | Status: DC
  Administered 2017-12-08 – 2017-12-10 (×2): via INTRAVENOUS
  Filled 2017-12-08 (×6): qty 1000

## 2017-12-08 MED ORDER — SODIUM CHLORIDE 0.9 % IV SOLN
50.0000 mg | Freq: Two times a day (BID) | INTRAVENOUS | Status: DC
  Administered 2017-12-08 – 2017-12-10 (×4): 50 mg via INTRAVENOUS
  Filled 2017-12-08 (×5): qty 5

## 2017-12-08 NOTE — Progress Notes (Signed)
PROGRESS NOTE    Kelly Maxwell  MVH:[id number] DOB: Jan 23, 1946 DOA: 12/07/2017 PCP: Wenda Low, MD   Brief Narrative:71 y.o. female with hx left temporal lobe gliosarcoma s/p surgery, XRT and chemo in 2013-2015 who presented with acute somnolence noted by family members x 2 days. Patient's daughter noted that about 2 weeks ago, she had a fall while ambulating towards her right side, which is unusual for her, and then otherwise for the past 2 weeks had subtle increase in fatigue. However day prior to admission, patient was barely responsive and then this morning was completely unresponsive while in bed. Her prior baseline: she was able to use a walker and have normal conversation. Patient is unresponsive on my exam and unable to provide history. CT head in ED showed severe L cerebral edema with new large 5.5 cm mass and 82m rightward midline shift. No hemorrhage.  Daughter ([name] is the healthcare POA and confirmed with ED and myself that patient's code status has been updated to DNR/DNI. The daughter and patient's husband both are understandably in shock regarding the head CT results. Neurosurgery was contacted by ED -- recommended no surgical intervention and palliative care  Assessment & Plan:   Active Problems:   Brain edema (HCC)   #1 gliosarcoma diagnosed in 2013-patient admitted with somnolence with CT findings of severe brain edema with midline shift and suspected gliosarcoma recurrence.  CT showed 5.5 cm mass on the left with left-sided edema and right sided midline shift.  ED physician spoke with neurosurgery recommends no surgical intervention.  Continue Decadron 10 mg IV every 6.  Morphine for pain control.  When I met with the daughter first she wanted to pay take patient home with hospice today.  She told me that she does not want her mother to die in the hospital.  Case manager spoke to the patient's family later on and the daughter wanted to keep her 1 more day in the hospital and  take her home on Monday with hospice.  They do not want any aggressive intervention they are aware of the bad prognosis and they are everywhere she does not have a lot of time.  They want her to get the IV Decadron so she might wake up a little bit so the family can see her in the next few hours.   DVT prophylaxis: None Code Status: DO NOT RESUSCITATE Family Communication: Discussed with healthcare power of attorney erin patient's daughter Disposition Plan: Discharge home on Monday with hospice  Consultants: ED physician spoke with neurosurgery   Procedures: None Antimicrobials none  Subjective: Patient somnolent minimal response to sternal rub no response to calling her name  Objective: Vitals:   12/07/17 2115 12/07/17 2130 12/07/17 2145 12/08/17 0446  BP: (!) 152/80 (!) 149/78 (!) 148/75 121/76  Pulse: 68 66 70 74  Resp: 18 (!) 21 (!) 21   Temp:    97.8 F (36.6 C)  TempSrc:    Oral  SpO2: 98% 97% 97% 99%  Weight:      Height:        Intake/Output Summary (Last 24 hours) at 12/08/2017 1254 Last data filed at 12/08/2017 0917 Gross per 24 hour  Intake 3125 ml  Output 1100 ml  Net 2025 ml   Filed Weights   12/07/17 1752 12/07/17 1815  Weight: 65 kg (S) 65 kg    Examination:  General exam: Somnolent with no response to commands Respiratory system: Clear to auscultation. Respiratory effort normal. Cardiovascular system: S1 &  S2 heard, RRR. No JVD, murmurs, rubs, gallops or clicks. No pedal edema. Gastrointestinal system: Abdomen is nondistended, soft and nontender. No organomegaly or masses felt. Normal bowel sounds heard. Central nervous system: Unable to arouse Extremities: Symmetric 5 x 5 power. Skin: No rashes, lesions or ulcers.  Left craniotomy scar   Data Reviewed: I have personally reviewed following labs and imaging studies  CBC: Recent Labs  Lab 12/07/17 1753 12/07/17 1847 12/08/17 0522  WBC 9.9  --  9.7  NEUTROABS 8.8*  --   --   HGB 11.0* 10.2*  10.8*  HCT 35.3* 30.0* 34.7*  MCV 94.6  --  95.9  PLT 249  --  086   Basic Metabolic Panel: Recent Labs  Lab 12/07/17 1753 12/07/17 1847 12/08/17 0522  NA 147* 142 142  K 2.9* 2.9* 4.3  CL 114* 111 111  CO2 22  --  20*  GLUCOSE 90 85 125*  BUN 8 4* 9  CREATININE <0.30* 0.20* 0.34*  CALCIUM 8.8*  --  8.8*   GFR: Estimated Creatinine Clearance: 54.1 mL/min (A) (by C-G formula based on SCr of 0.34 mg/dL (L)). Liver Function Tests: Recent Labs  Lab 12/07/17 1753  AST 17  ALT 15  ALKPHOS 64  BILITOT 1.3*  PROT 6.0*  ALBUMIN 3.2*   No results for input(s): LIPASE, AMYLASE in the last 168 hours. No results for input(s): AMMONIA in the last 168 hours. Coagulation Profile: Recent Labs  Lab 12/07/17 1753  INR 1.04   Cardiac Enzymes: No results for input(s): CKTOTAL, CKMB, CKMBINDEX, TROPONINI in the last 168 hours. BNP (last 3 results) No results for input(s): PROBNP in the last 8760 hours. HbA1C: No results for input(s): HGBA1C in the last 72 hours. CBG: Recent Labs  Lab 12/07/17 1834 12/07/17 2246 12/08/17 0125 12/08/17 0441  GLUCAP 81 99 105* 120*   Lipid Profile: No results for input(s): CHOL, HDL, LDLCALC, TRIG, CHOLHDL, LDLDIRECT in the last 72 hours. Thyroid Function Tests: No results for input(s): TSH, T4TOTAL, FREET4, T3FREE, THYROIDAB in the last 72 hours. Anemia Panel: No results for input(s): VITAMINB12, FOLATE, FERRITIN, TIBC, IRON, RETICCTPCT in the last 72 hours. Sepsis Labs: Recent Labs  Lab 12/07/17 1847  LATICACIDVEN 0.76    Recent Results (from the past 240 hour(s))  Blood Culture (routine x 2)     Status: None (Preliminary result)   Collection Time: 12/08/17  5:22 AM  Result Value Ref Range Status   Specimen Description   Final    BLOOD SITE NOT SPECIFIED Performed at [facility], [address] [place], [place] [address]    Special Requests   Final    BOTTLES DRAWN AEROBIC ONLY Blood Culture adequate volume Performed at  [facility] [address], [place], [place] [address]    Culture PENDING  Incomplete   Report Status PENDING  Incomplete         Radiology Studies: Ct Head Wo Contrast  Result Date: 12/07/2017 CLINICAL DATA:  Altered level of consciousness. Fall. History of gliosarcoma. EXAM: CT HEAD WITHOUT CONTRAST TECHNIQUE: Contiguous axial images were obtained from the base of the skull through the vertex without intravenous contrast. COMPARISON:  Head CT 10/08/2017 and MRI 08/27/2017 FINDINGS: Brain: There is markedly progressive, severe vasogenic edema throughout and centrally the entirety of the left cerebral hemisphere resulting in diffuse sulcal effacement, left lateral ventricle effacement, and 15 mm of rightward midline shift. There is mass effect on the midbrain. A new 5.5 x  3 cm mass is present in the anterior left basal ganglia region. There is new mild dilatation of the atrium and temporal horn of the right lateral ventricle likely from obstruction at the level of the foramina of [place]. No acute intracranial hemorrhage or acute cortically based infarct is identified. There is no extra-axial fluid collection. Vascular: Mild calcified atherosclerosis at the skull base. Skull: Left perianal craniotomy.  No fracture. Sinuses/Orbits: Mild left sphenoid and posterior left ethmoid sinus mucosal thickening. Clear mastoid air cells. Left cataract extraction. Other: None. IMPRESSION: 1. Markedly increased, severe edema throughout the left cerebral hemisphere with new 5.5 cm left basal ganglia mass consistent with progression of known gliosarcoma. 2. 15 mm rightward midline shift with trapping of the right lateral ventricle. 3. No acute intracranial hemorrhage. Critical Value/emergent results were called by telephone at the time of interpretation on 12/07/2017 at 7:05 pm to Dr. Merrily Pew , who verbally acknowledged these results. Electronically Signed   By: Logan Bores M.D.   On: 12/07/2017  19:05   Dg Chest Portable 1 View  Result Date: 12/07/2017 CLINICAL DATA:  Unresponsive, altered mental status, baseline lethargy, history brain tumor EXAM: PORTABLE CHEST 1 VIEW COMPARISON:  Portable exam 1808 hours compared to 10/08/2017 FINDINGS: RIGHT arm PICC line tip projects over SVC. Normal heart size, mediastinal contours, and pulmonary vascularity. RIGHT basilar atelectasis. Question peripheral infiltrate RIGHT upper lobe. Remaining lungs clear. No pleural effusion or pneumothorax. IMPRESSION: RIGHT basilar atelectasis with question peripheral infiltrate in the RIGHT upper lobe, new since previous exam. Electronically Signed   By: Lavonia Dana M.D.   On: 12/07/2017 18:24        Scheduled Meds: . dexamethasone  10 mg Intravenous Q6H   Continuous Infusions: . fluconazole (DIFLUCAN) IV 400 mg (12/07/17 2151)  . lacosamide (VIMPAT) IV 50 mg (12/08/17 0932)  . lactated ringers with kcl       LOS: 1 day     Georgette Shell, MD [facility]  If 7PM-7AM, please contact night-coverage [email or web address] Password [email or web address] 12/08/2017, 12:54 PM

## 2017-12-08 NOTE — Plan of Care (Signed)
Patient stable during 7 a to 7 p shift, opens eyes and smiles intermittently, moans when repositioned by staff but otherwise appears comfortable.  Multiple family members at bedside during shift.

## 2017-12-08 NOTE — Progress Notes (Signed)
[facility]) Hospital Liaison: RN visit 11:55 AM  Notified by Jonnie Finner, RNCM, of family request for [facility] services at home after discharge.Chart and patient information under review by [facility] physician. Hospice eligibility pending at this time.  Writer spoke with daughter [name]) and spouse Shanon Brow) at the bedside to initial education related to hospice philosophy, services and team approach to care. Family verbalized understanding of information given. Per discussion, plan is for discharge to home by PTAR on 12/09/2017.  Please send signed and completed DNR form home with patient/family.  Patient will need prescriptions for discharge comfort medications.  DME needs have been discussed, reported to hospice RN pt has "all the equipment she needs" previously provided by the recent [place] agency ([facility]) in the home. Therefore no request made for additional equipment. Home address has been verified and is correct in the chart. Daughter [name] [phone]) is the family member to contact to arrange any needed DME delivery if needed at the time of discharge.  Completed discharge summary will need to be faxed to [facility] at [phone] when final. Please notify [facility] when patient is ready to leave the unit at discharge. (Call [phone] between 8:30 and 5:00 pm. Call [phone] after 5 pm). [facility] information and contact numbers giver to [name] (daughter) during this visit. Above information shared with [facility].  Please call with hospice related questions.  Thank you for this referral.  [facility] Liaison [place] are on AMION

## 2017-12-08 NOTE — Consult Note (Signed)
Consultation Note Date: 12/08/2017   Patient Name: Kelly Maxwell  DOB: [birth year]  MRN: [id number]  Age / Sex: 72 y.o., female  PCP: Kelly Low, MD Referring Physician: Georgette Shell, MD  Reason for Consultation: Establishing goals of care  HPI/Patient Profile: 72 y.o. female    admitted on 12/07/2017   Clinical Assessment and Goals of Care:  72 y.o.femalewith hx left temporal lobe gliosarcoma s/p surgery, XRT and chemo in 2013-2015 who presented with acute somnolence noted by family members x 2 days. Patient's daughter noted that about 2 weeks ago, she had a fall while ambulating towards her right side, which is unusual for her, and then otherwise for the past 2 weeks had subtle increase in fatigue. However day prior to admission, patient was barely responsive and then this morning was completely unresponsive while in bed. Her prior baseline: she was able to use a walker and have normal conversation. Patient is unresponsive on my exam and unable to provide history. CT head in ED showed severe L cerebral edema with new large 5.5 cm mass and 77m rightward midline shift. No hemorrhage.  Daughter (Kelly Maxwell is the healthcare POA and confirmed with ED and myself that patient's code status has been updated to DNR/DNI. The daughter and patient's husband both are understandably in shock regarding the head CT results. Neurosurgery was contacted by ED -- recommended no surgical intervention and palliative care.  Patient known to palliative service, and extensive [place]ions were held in previous hospitalization.   I met with the patient's daughter outside her room, as discussions were being [facility]   Met with the patient and her husband. Patient opened her eyes and didn't verbalize but was able to gaze meaningfully at her family.   Daughter EJunie Panningstates that while her overall goals for her  mother are towards comfort measures at end of life, she doesn't want the patient to die in the hospital. She is leaning towards hospice at home, how ever, is still not prepared for the patient not having IVF through her PICC line at home. We have discussed this extensively.   Please note additional discussions and recommendations as listed below.   NEXT OF KIN  husband Daughter   SUMMARY OF RECOMMENDATIONS   Agree with DNR Goals now shifting towards comfort measures, how ever, patient's daughter and husband would like to continue IVF via her PICC. Ongoing discussions about risks of artificial hydration at end of life. Continue to support the family holistically.  Appreciate [facility] follow up.  Prognosis likely less than 2 weeks, in my opinion, at this point.  Continue current mode of care.  Thank you for the consult.   Code Status/Advance Care Planning:  DNR    Symptom Management:    continue current mode of care.   Palliative Prophylaxis:   Delirium Protocol  Psycho-social/Spiritual:   Desire for further Chaplaincy support:yes  Additional Recommendations: Education on Hospice  Prognosis:   < 2 weeks  Discharge Planning: Home with Hospice  Primary Diagnoses: Present on Admission: . Brain edema ([place])   I have reviewed the medical record, interviewed the patient and family, and examined the patient. The following aspects are pertinent.  Past Medical History:  Diagnosis Date  . Cancer ([place])   . Depression   . GERD (gastroesophageal reflux disease)   . 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 Headache(784.0)   . Hiatal hernia   . History of radiation therapy 01/07/2012-02/18/12   left temporal  58GY  . HLD (hyperlipidemia)   . PONV (postoperative nausea and vomiting)   . 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 Marital status: Married    Spouse name: Not on file  . Number of children: 3  . Years of education: Not on file  . Highest education level: Not on file  Occupational History  . Not on file  Social Needs  . Financial resource strain: Not on file  . Food insecurity:    Worry: Not on file    Inability: Not on file  . Transportation needs:    Medical: Not on file    Non-medical: Not on file  Tobacco Use  . Smoking status: Never Smoker  . Smokeless tobacco: Never Used  Substance and Sexual Activity  . Alcohol use: No  . Drug use: No  . Sexual activity: Not Currently  Lifestyle  . Physical activity:    Days per week: Not on file    Minutes per session: Not on file  . Stress: Not on file  Relationships  . Social connections:    Talks on phone: Not on file    Gets together: Not on file    Attends religious service: Not on file    Active member of club or organization: Not on file    Attends meetings of clubs or organizations: Not on file    Relationship status: Not on file  Other Topics Concern  . Not on file  Social History Narrative   2nd marriage of 6 years, previously widowed   Family History  Problem Relation Age of Onset  . Heart disease Mother    Scheduled Meds: . dexamethasone  10 mg Intravenous Q6H   Continuous Infusions: . fluconazole (DIFLUCAN) IV 400 mg (12/07/17 2151)  . lacosamide (VIMPAT) IV 50 mg (12/08/17 0932)  . lactated ringers with kcl     PRN Meds:.acetaminophen, morphine injection Medications Prior to Admission:  Prior to Admission medications   Medication Sig Start Date End Date Taking? Authorizing Provider  amLODipine (NORVASC) 5 MG tablet Take 1 tablet (5 mg total) by mouth daily. 08/26/17  Yes Vaslow, Acey Lav, MD  amphetamine-dextroamphetamine (ADDERALL) 30 MG tablet Take 1 tablet by mouth daily. 11/19/17  Yes Vaslow, Acey Lav, MD  atorvastatin (LIPITOR)  40 MG tablet Take 40 mg by mouth daily.    Yes [provider]  bisacodyl (DULCOLAX) 5 MG EC tablet Take 5 mg by mouth daily.   Yes [provider]  Steva Colder Extract POWD Take 1 packet by mouth daily.    Yes [provider]  Bromfenac Sodium (PROLENSA) 0.07 % SOLN Place 1 drop into the left eye daily. Reported on 09/02/2015    Yes [provider]  cholecalciferol (VITAMIN D) 1000 units tablet Take 1  tablet (1,000 Units total) by mouth daily. 06/20/17  Yes Vaslow, Acey Lav, MD  clonazePAM (KLONOPIN) 0.5 MG tablet Take 1 tablet (0.5 mg total) by mouth at bedtime as needed for anxiety. 07/19/17  Yes Eugenie Filler, MD  dexamethasone (DECADRON) 2 MG tablet Take 1 tablet (2 mg total) by mouth daily. 08/09/17  Yes Vaslow, Acey Lav, MD  Diaper Rash Products ([employer] DIAPER) 1-10 % CREA Apply 1 application topically as needed (chaffing).  09/06/17  Yes [provider]  Difluprednate (DUREZOL) 0.05 % EMUL Place 1 drop into the left eye 2 (two) times daily.    Yes [provider]  feeding supplement, ENSURE ENLIVE, (ENSURE ENLIVE) LIQD Take 237 mLs by mouth 2 (two) times daily between meals. 10/31/17  Yes Sheikh, Omair Latif, DO  GLEOSTINE 10 MG capsule Take 10 mg by mouth daily. 09/26/17  Yes [provider]  GLEOSTINE 100 MG capsule Take 100 mg by mouth as directed.  09/26/17  Yes [provider]  GLEOSTINE 40 MG capsule Take 40 mg by mouth daily. 09/26/17  Yes [provider]  lacosamide (VIMPAT) 50 MG TABS tablet Take 1 tablet (50 mg total) by mouth 2 (two) times daily. 06/24/17  Yes Vaslow, Acey Lav, MD  magic mouthwash w/lidocaine SOLN Take 5 mLs by mouth 4 (four) times daily as needed for mouth pain. 10/31/17  Yes Sheikh, Omair Latif, DO  Melatonin 5 MG CAPS Take 10-20 mg by mouth at bedtime.    Yes [provider]  metFORMIN (GLUCOPHAGE) 500 MG tablet Take 500 mg by mouth 2 (two) times daily with a meal.    Yes  [provider]  NON FORMULARY Take 1 capsule by mouth 2 (two) times daily. Lazarus Natural CBD Oil    Yes [provider]  nystatin (MYCOSTATIN) 100000 UNIT/ML suspension Take 5 mLs by mouth 4 (four) times daily. Swish and spit out 10/24/17  Yes [provider]  nystatin cream (MYCOSTATIN) Apply 1 application topically as needed for dry skin.  08/21/17  Yes [provider]  ondansetron (ZOFRAN) 8 MG tablet Take 1 tablets (74m) by mouth once 30-60 prior to lomustine and Q 8h prn N/V 09/25/17  Yes Vaslow, ZAcey Lav MD  pantoprazole (PROTONIX) 40 MG tablet Take 1 tablet (40 mg total) by mouth daily. 08/09/17  Yes Vaslow, ZAcey Lav MD  senna-docusate (SENOKOT-S) 8.6-50 MG tablet Take 1 tablet by mouth at bedtime as needed for mild constipation. 07/19/17  Yes TEugenie Filler MD   Allergies  Allergen Reactions  . Ciprofloxacin     AMS  . Codeine Nausea And Vomiting and Nausea Only  . 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    . Compazine [Prochlorperazine Edisylate] Other (See Comments) and Rash    Became hyper Became hyper   Review of Systems Doesn't verbalize  Physical Exam Pale weak appearing lady Opens eyes Does appear to recognize family present in the room In no distress Shallow regular breath sounds Trace edema S 1 S 2   Vital Signs: BP 121/76 (BP Location: Left Arm)   Pulse 74   Temp 97.8 F (36.6 C) (Oral)   Resp (!) 21   Ht (S) 5' 4"  (1.626 m)   Wt (S) 65 kg   SpO2 99%   BMI 24.60 kg/m  Pain Scale: Faces   Pain Score: 0-No pain   SpO2: SpO2: 99 % O2 Device:SpO2: 99 % O2 Flow Rate: .O2  Flow Rate (L/min): 2 L/min  IO: Intake/output summary:   Intake/Output Summary (Last 24 hours) at 12/08/2017 1259 Last data filed at 12/08/2017 0917 Gross per 24 hour  Intake 3125 ml  Output 1100 ml  Net 2025 ml    LBM: Last BM Date: 12/06/17 Baseline Weight:  Weight: 65 kg Most recent weight: Weight: (S) 65 kg     Palliative Assessment/Data:   PPS 20%  Time In:  11.30  Time Out:  12.30 Time Total:  60  Greater than 50%  of this time was spent counseling and coordinating care related to the above assessment and plan.  Signed by: Loistine Chance, MD  [phone]  Please contact Palliative Medicine Team phone at [phone] for questions and concerns.  For individual provider: See Shea Evans

## 2017-12-08 NOTE — Care Management Note (Signed)
Case Management Note  Patient Details  Name: Kelly Maxwell MRN: [id number] Date of Birth: Aug 14, 1945  Subjective/Objective:  AMS,  Gliosarcoma, severe brain edema, hypokalemia                Action/Plan: NCM spoke to husband and dtr at bedside. Unable to arouse pt to speak. Offered choice for Home Hospice/list provided. Husband is requesting [facility]. [place] with new referral. Pt has hospital bed, oxygen, wheelchair, rolling walker, bedside commode and shower chair at home. PTAR transport requested. Family requested to speak to attending. Notified attending.   Expected Discharge Date:  12/09/17               Expected Discharge Plan:  Home w Hospice Care  In-House Referral:  NA  Discharge planning Services  CM Consult  Post Acute Care Choice:  Hospice Choice offered to:  Adult Children, Spouse  DME Arranged:  Other see comment DME Agency:  [place]:  RN [facility] Agency:  [facility] [place]  Status of Service:  Completed, signed off  If discussed at [place] of Stay Meetings, dates discussed:    Additional Comments:  Erenest Rasher, RN 12/08/2017, 11:08 AM

## 2017-12-09 DIAGNOSIS — G936 Cerebral edema: Secondary | ICD-10-CM

## 2017-12-09 DIAGNOSIS — G939 Disorder of brain, unspecified: Secondary | ICD-10-CM

## 2017-12-09 LAB — BASIC METABOLIC PANEL
Anion gap: 11 (ref 5–15)
BUN: 13 mg/dL (ref 8–23)
CHLORIDE: 113 mmol/L — AB (ref 98–111)
CO2: 20 mmol/L — AB (ref 22–32)
Calcium: 8.7 mg/dL — ABNORMAL LOW (ref 8.9–10.3)
Creatinine, Ser: 0.31 mg/dL — ABNORMAL LOW (ref 0.44–1.00)
GFR calc Af Amer: >60 mL/min (ref >60)
GFR calc non Af Amer: >60 mL/min (ref >60)
GLUCOSE: 108 mg/dL — AB (ref 70–99)
POTASSIUM: 4 mmol/L (ref 3.5–5.1)
Sodium: 144 mmol/L (ref 135–145)

## 2017-12-09 MED ORDER — FLUCONAZOLE IN SODIUM CHLORIDE 200-0.9 MG/100ML-% IV SOLN
200.0000 mg | INTRAVENOUS | Status: DC
  Filled 2017-12-09 (×2): qty 100

## 2017-12-09 MED ORDER — PANTOPRAZOLE SODIUM 40 MG PO PACK: 40.0000 mg | PACK | Freq: Every day | ORAL | Status: AC

## 2017-12-09 MED ORDER — SODIUM CHLORIDE 0.9% FLUSH: 10.0000 mL | INTRAVENOUS | Status: DC | PRN

## 2017-12-09 MED ORDER — AMOXICILLIN-POT CLAVULANATE 125-31.25 MG/5ML PO SUSR: 250.0000 mg | Freq: Two times a day (BID) | ORAL | 0 refills | Status: AC

## 2017-12-09 MED ORDER — MORPHINE SULFATE (PF) 2 MG/ML IV SOLN: 1.0000 mg | INTRAVENOUS | 0 refills | Status: AC | PRN

## 2017-12-09 MED ORDER — SODIUM CHLORIDE 0.9 % IV SOLN: 50.0000 mg | Freq: Two times a day (BID) | INTRAVENOUS | Status: DC

## 2017-12-09 MED ORDER — DEXAMETHASONE SODIUM PHOSPHATE 10 MG/ML IJ SOLN: 10.0000 mg | Freq: Four times a day (QID) | INTRAMUSCULAR | 0 refills | Status: AC

## 2017-12-09 MED ORDER — FLUCONAZOLE IN SODIUM CHLORIDE 200-0.9 MG/100ML-% IV SOLN: 200.0000 mg | INTRAVENOUS | Status: AC

## 2017-12-09 NOTE — Care Management Note (Signed)
Case Management Note  Patient Details  Name: DREAMA KUNA MRN: [id number] Date of Birth: 06-30-1945  Subjective/Objective:[facility] rep [name] following-awaiting if can accept d/t ivf-the medical director will respond to liason, & [name] will confirm.PTAR/DNR forms already in shadow chart. All dme has been delivered to home. Awaiting response from [facility] on ivf.                    Action/Plan:dc home w/[facility]/PTAR   Expected Discharge Date:  12/09/17               Expected Discharge Plan:  Home w Hospice Care  In-House Referral:  NA  Discharge planning Services  CM Consult  Post Acute Care Choice:  Hospice Choice offered to:  Adult Children, Spouse  DME Arranged:  Other see comment DME Agency:  [place]:  RN [facility] Agency:  [facility] [place]  Status of Service:  Completed, signed off  If discussed at [place] of Stay Meetings, dates discussed:    Additional Comments:  Dessa Phi, RN 12/09/2017, 10:40 AM

## 2017-12-09 NOTE — Progress Notes (Signed)
[facility]osp[facility] and Pall[facility] of [facility]) RN Visit  Met with family, they are aware that eligibility is still pending at this time.  Family is requesting several IV medications be continued, discussion occurring with [facility] MD.  Will update RN CM once decision has been made.  There are no DME needs currently for the pt to go home later today.  Thank you, [name] BSN, [facility] Liaison (listed in [place]) [phone]

## 2017-12-09 NOTE — Progress Notes (Signed)
Hosp[facility] and Palliative [facility] of [facility])  Spoke with daughter [name], regarding hospice philosophy.  Family is very adamant that they want her to go home on IV vimpat, IV decadron, IV diflucan.  Discussed that this may not align with the hospice philosophy, and vimpat would need to be paid out of pocket.  Options offered on other AEDs and daughter very adamant that they all cause excessive sleepiness.  Educated further on hospice, and pt life expectancy and they are aware that it could be "days to weeks".  Family feels that hospice may not be their best option at this time.  Please call with any questions or concerns  Venia Carbon BSN, RN [facility] Liaison (listed in [place]) [phone]

## 2017-12-09 NOTE — Progress Notes (Signed)
Daily Progress Note   Patient Name: Kelly Maxwell       Date: 12/09/2017 DOB: [birth year]  Age: 72 y.o. MRN#: [id number] Attending Physician: Hosie Poisson, MD Primary Care Physician: Wenda Low, MD Admit Date: 12/07/2017  Reason for Consultation/Follow-up: Establishing goals of care  Subjective:  patient with one eye partially open, she will involuntarily grimace from time to time.   Daughter and husband at bedside, ongoing [place] discussions, see below.    Length of Stay: 2  Current Medications: Scheduled Meds:  . dexamethasone  10 mg Intravenous Q6H    Continuous Infusions: . fluconazole (DIFLUCAN) IV    . lacosamide (VIMPAT) IV 50 mg (12/09/17 1023)  . lactated ringers with kcl 75 mL/hr at 12/08/17 1617    PRN Meds: acetaminophen, morphine injection, sodium chloride flush  Physical Exam         Pale weak appearing lady Resting in bed In no distress Shallow regular clear breath sounds S 1 S 2 Abdomen is soft  Trace edema She is some what awake, but not alert She hasn't verbalized with me, family states that she was able to say a few words to them earlier today.   Vital Signs: BP 121/75 (BP Location: Left Arm)   Pulse (!) 55   Temp (!) 97.3 F (36.3 C) (Oral)   Resp 20   Ht (S) '5\' 4"'$  (1.626 m)   Wt (S) 65 kg   SpO2 100%   BMI 24.60 kg/m  SpO2: SpO2: 100 % O2 Device: O2 Device: Nasal Cannula O2 Flow Rate: O2 Flow Rate (L/min): 1.5 L/min  Intake/output summary:   Intake/Output Summary (Last 24 hours) at 12/09/2017 1208 Last data filed at 12/09/2017 0930 Gross per 24 hour  Intake 1494.64 ml  Output 2000 ml  Net -505.36 ml   LBM: Last BM Date: 12/06/17 Baseline Weight: Weight: 65 kg Most recent weight: Weight: (S) 65 kg       Palliative  Assessment/Data:   PPS 30%   Patient Active Problem List   Diagnosis Date Noted  . Unresponsive   . Midline shift of brain   . Encounter for palliative care   . Goals of care, counseling/discussion   . Cerebral edema ([place]) 12/07/2017  . Hypoxia 11/26/2017  . Palliative care encounter 10/24/2017  . Dysphagia 10/24/2017  .  Thrombocytopenia ([place]) 10/24/2017  . DM2 (diabetes mellitus, type 2) ([place]) 10/09/2017  . Unsteady gait 09/19/2017  . Weakness generalized 09/19/2017  . Counseling regarding advanced care planning and goals of care 09/19/2017  . HTN (hypertension) 07/19/2017  . Acute metabolic encephalopathy 50/27/7412  . Dehydration 07/16/2017  . 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 Anxiety and depression 09/03/2015  . Personal history of other infectious and parasitic diseases 09/03/2015  . 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 HLD (hyperlipidemia)   . GERD (gastroesophageal reflux disease)   . PONV (postoperative nausea and vomiting)   . 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 Hiatal hernia     Palliative Care Assessment & Plan   Patient Profile:    Assessment:  Gliosarcoma Cerebral edema Thrush Generalized decline Dysphagia   Recommendations/Plan:   family meeting: ongoing [place] discussions with patient's daughter and husband. They have met with hospice liaisons and overall, want to take the patient home and are also accepting of hospice services following the patient at home for extra support.   While they realize the serious incurable illness of the patient's illness, they've struggled with making decisions regarding full comfort measures. They would still like to continue with IVF at home, if needed, they'd still like for the patient to have IV antibiotics. We have had extensive gentle but frank conversations about this and about end of life signs and symptoms and what comfort measures entail.     Continue to support the family holistically.    Code Status:    Code Status Orders  (From admission, onward)         Start     Ordered   12/07/17 2057  Do not attempt resuscitation (DNR)  Continuous    Question Answer Comment  In the event of cardiac or respiratory ARREST Do not call a "code blue"   In the event of cardiac or respiratory ARREST Do not perform Intubation, CPR, defibrillation or ACLS   In the event of cardiac or respiratory ARREST Use medication by any route, position, wound care, and other measures to relive pain and suffering. May use oxygen, suction and manual treatment of airway obstruction as needed for comfort.      12/07/17 2100        Code Status History    Date Active Date Inactive Code Status Order ID Comments User Context   12/07/2017 2015 12/07/2017 2100 DNR [id number]  Merrily Pew, MD ED   10/24/2017 2328 10/31/2017 1912 Full Code [id number]  Vianne Bulls, MD ED   10/09/2017 0111 10/11/2017 1705 Full Code [id number]  [name], DO ED   07/16/2017 2238 07/19/2017 2154 Full Code [id number]  Hugelmeyer, [place], DO Inpatient   09/03/2015 0605 09/06/2015 1440 Full Code [id number]  [name], MD Inpatient       Prognosis:   < 2 weeks  Discharge Planning:  Home with Hospice, pending further arrangements.   Care plan was discussed with  Patient's bedside RN, patient's daughter and husband.   Thank you for allowing the Palliative Medicine Team to assist in the care of this patient.   Time In: 11 Time Out: 11.35 Total Time 35 Prolonged Time Billed  no       Greater than 50%  of this time was spent counseling and coordinating care related to the above assessment and plan.  Loistine Chance, MD [phone]  Please contact Palliative Medicine Team phone at [phone]  for questions and concerns.

## 2017-12-09 NOTE — Progress Notes (Signed)
Hospice and Palliative Care of [facility]) RN Visit  Contacted by Genoveva Ill regarding family needing further explanation of hospice versus home health.  Met with family, educated, listened and offered information about how we could help them in the home setting care for Kelly Maxwell.  They advised that they have 12 days worth of vimpat at home that they will administer themselves.  They are agreeable to hospice services at this time.  Pt information re-submitted to [facility] MD.  Tentative plan to d/c home on 10/8 with [facility] RN to visit at 230.  Thank you, Venia Carbon BSN, [facility] Liaison (listed in [place]) [phone]

## 2017-12-09 NOTE — Progress Notes (Signed)
Still waiting for response on [facility] accepting-they are still discussing the case-concerns are if more appropriate for [facility] vs [facility]-I have alerted Dr. Karleen Hampshire, & Dr. Rowe Pavy. [facility] liason [name] will get back to me.

## 2017-12-09 NOTE — Evaluation (Signed)
Clinical/Bedside Swallow Evaluation Patient Details  Name: Kelly Maxwell MRN: [id number] Date of Birth: 18-Apr-1945  Today's Date: 12/09/2017 Time: SLP Start Time (ACUTE ONLY): 5809 SLP Stop Time (ACUTE ONLY): 1340 SLP Time Calculation (min) (ACUTE ONLY): 35 min  Past Medical History:  Past Medical History:  Diagnosis Date  . Cancer ([place])   . Depression   . GERD (gastroesophageal reflux disease)   . 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 Headache(784.0)   . Hiatal hernia   . History of radiation therapy 01/07/2012-02/18/12   left temporal  60GY  . HLD (hyperlipidemia)   . PONV (postoperative nausea and vomiting)   . 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 BUNIONECTOMY  2001   right  . CATARACT EXTRACTION Left   . CRANIOTOMY  12/17/2011   Procedure: CRANIOTOMY TUMOR EXCISION;  Surgeon: Otilio Connors, MD;  Location: [facility];  Service: Neurosurgery;  Laterality: Left;  LEFT temporal craniotomy with stealth for tumor resection  . DILATION AND CURETTAGE OF UTERUS  2007  . TOTAL KNEE ARTHROPLASTY  2000   left  . VENTRAL HERNIA REPAIR N/A 09/03/2015   Procedure: LAPAROSCOPIC repair of incarcerated spigelian hernia with mesh, reduction and repair ;  Surgeon: [name], MD;  Location: [facility];  Service: General;  Laterality: N/A;   HPI:  72 year old female admitted 12/07/17 with AMS/somnolence. 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 Head CT = severe left cerebral edema with new large (5.5cm) mass and 70mm midline shift.   Assessment / Plan / Recommendation Clinical Impression  Pt seen at bedside for assessment of swallow function and safety, and identification of least  restrictive diet. Pt's daughter and husband were at bedside, and reported no history of swallowing difficulty. Pt does have a hx GERD, on PPI. Pt required encouragement to accept po trials. No overt s/s aspiration observed on thin liquid or puree consistencies, however, pt did exhibit delayed oral prep and appeared to have poor awareness of bolus. At this time, pt appears safe for pureed solids and thin liquids via cup sip. Daughter reports they have a special straw that pt uses at home. Pt is at risk for aspiration, given altered mentation, and her appropriateness for and willingness to take po is likely to be highly variable. Daughter was encouraged to puree foods for energy conservation. Behavioral and dietary strategies for management of esophageal dysmotility were provided in written form, and reviewed with pt's daughter. Husband indicates they are being discharged home today.  If needs arise once pt is home, recommend home health consult.     SLP Visit Diagnosis: Dysphagia, unspecified (R13.10)    Aspiration Risk  Moderate aspiration risk    Diet Recommendation Dysphagia 1 (Puree);Thin liquid   Liquid Administration via: Cup;Straw Medication Administration: Crushed with puree Supervision: Full supervision/cueing for compensatory strategies Compensations: Slow rate;Minimize environmental distractions;Small sips/bites Postural Changes: Seated upright at 90 degrees;Remain upright for at least 30 minutes after po intake    Other  Recommendations Oral Care Recommendations: Oral care QID   Follow up Recommendations Home health SLP(if needs arise)      Frequency and Duration min 1 x/week if not DC'd today, will follow up for education 1  week       Prognosis Prognosis for Safe Diet Advancement: Guarded Barriers to Reach Goals: Cognitive deficits      Swallow Study   General Date of Onset: 12/07/17 HPI: 72 year old female admitted 12/07/17 with AMS/somnolence. PMH: left temporal  gliosarcoma, XRT and chemo 2013-2015, GERD, hiatal hernia. Head CT = severe left cerebral edema with new large (5.5cm) mass and 60mm midline shift. Type of Study: Bedside Swallow Evaluation Previous Swallow Assessment: none Diet Prior to this Study: NPO Temperature Spikes Noted: No Respiratory Status: Nasal cannula History of Recent Intubation: No Behavior/Cognition: Alert;Doesn't follow directions Oral Cavity Assessment: (appearance of thrush) Oral Cavity - Dentition: Adequate natural dentition Self-Feeding Abilities: Total assist Patient Positioning: Upright in bed Baseline Vocal Quality: Low vocal intensity Volitional Cough: Cognitively unable to elicit Volitional Swallow: Unable to elicit    Oral/Motor/Sensory Function Overall Oral Motor/Sensory Function: (unable to assess)   Ice Chips Ice chips: Impaired Oral Phase Impairments: Poor awareness of bolus Oral Phase Functional Implications: Prolonged oral transit Pharyngeal Phase Impairments: Suspected delayed Swallow;Decreased hyoid-laryngeal movement   Thin Liquid Thin Liquid: Impaired Oral Phase Impairments: Poor awareness of bolus;Reduced labial seal Oral Phase Functional Implications: Prolonged oral transit Pharyngeal  Phase Impairments: Suspected delayed Swallow;Decreased hyoid-laryngeal movement    Nectar Thick Nectar Thick Liquid: Not tested   Honey Thick Honey Thick Liquid: Not tested   Puree Puree: Impaired Oral Phase Impairments: Poor awareness of bolus;Impaired mastication Oral Phase Functional Implications: Prolonged oral transit Pharyngeal Phase Impairments: Suspected delayed Swallow;Decreased hyoid-laryngeal movement   Solid     Solid: Not tested     Celia B. Quentin Ore, [facility] [place] Speech Language Pathologist [phone]  Shonna Chock 12/09/2017,2:18 PM

## 2017-12-09 NOTE — Progress Notes (Signed)
[facility])  Pt is eligible for hospice services, however the plan of care is for multiple IV medications at home.  [facility] MD, Dr. Konrad Dolores advised that this plan of care may be better managed at an inpatient facility.  RNCM to contact MD for further discussion around discharge options.  [facility] will continue to be available to answer questions/help with options.  Thank you, Venia Carbon BSN, [facility] Liaison (listed in [place]) [phone]

## 2017-12-09 NOTE — Discharge Summary (Addendum)
Physician Discharge Summary  [name]:[id number] DOB: [birth year] DOA: 12/07/2017  PCP: Wenda Low, MD  Admit date: 12/07/2017 Discharge date: 12/09/2017  Admitted From: Home Disposition:  Home hospice   Recommendations for Outpatient Follow-up:    Discharge Condition:Hospice.  CODE STATUS:DNR Diet recommendation: Comfort feeds.  Brief/Interim Summary: Kelly Maxwell is a 72 y.o. female with hx left temporal lobe gliosarcoma s/p surgery, XRT and chemo in 2013-2015 who presented with acute somnolence noted by family members x 2 days. Patient's daughter noted that about 2 weeks ago, she had a fall while ambulating towards her right side, which is unusual for her, and then otherwise for the past 2 weeks had subtle increase in fatigue. However day prior to admission, patient was barely responsive and yesterday , she was completely unresponsive while in bed. Her prior baseline: she was able to use a walker and have normal conversation. CT head in ED showed severe L cerebral edema with new large 5.5 cm mass and 69mm rightward midline shift. No hemorrhage.  Daughter Kelly Maxwell) is the healthcare POA and confirmed with ED and myself that patient's code status has been updated to DNR/DNI.   Discharge Diagnoses:  Active Problems:   Cerebral edema (HCC)   Unresponsive   Midline shift of brain   Encounter for palliative care   Goals of care, counseling/discussion  Severe brain edema with midline shift in setting of suspected gliosarcoma recurrence  > Somnolent on exam. CT head showing 5.5 cm L mass, severe L sided edema, and rightward midline shift 47mm. Neurosurgery recommends no surgical intervention Decadron and pain meds for discharge.  Palliative care care consulted and recommendations given.   # Hypokalemia K 2.9 likely due to poor nutrition              # Concern for thrush per family - diflucan IV for thrush.   Discharge Instructions  Discharge Instructions    Discharge  instructions   Complete by:  As directed    Follow up with Hospice MD as needed.     Allergies as of 12/09/2017      Reactions   Ciprofloxacin    AMS   Codeine Nausea And Vomiting, Nausea Only   Succinylcholine Chloride Other (See Comments)   Pseudocholinesterase Deficiency - Required post-op ventilation.     Compazine [prochlorperazine Edisylate] Other (See Comments), Rash   Became hyper Became hyper      Medication List    STOP taking these medications   amLODipine 5 MG tablet Commonly known as:  NORVASC   amphetamine-dextroamphetamine 30 MG tablet Commonly known as:  ADDERALL   atorvastatin 40 MG tablet Commonly known as:  LIPITOR   bisacodyl 5 MG EC tablet Commonly known as:  DULCOLAX   Bos[place]ia Serrata Extract Powd   cholecalciferol 1000 units tablet Commonly known as:  VITAMIN D   clonazePAM 0.5 MG tablet Commonly known as:  KLONOPIN   [employer] DIAPER 1-10 % Crea   dexamethasone 2 MG tablet Commonly known as:  DECADRON   DUREZOL 0.05 % Emul Generic drug:  Difluprednate   feeding supplement (ENSURE ENLIVE) Liqd   GLEOSTINE 10 MG capsule Generic drug:  lomustine   GLEOSTINE 100 MG capsule Generic drug:  lomustine   GLEOSTINE 40 MG capsule Generic drug:  lomustine   lacosamide 50 MG Tabs tablet Commonly known as:  VIMPAT   magic mouthwash w/lidocaine Soln   Melatonin 5 MG Caps   metFORMIN 500 MG tablet Commonly known as:  GLUCOPHAGE  NON FORMULARY   nystatin 100000 UNIT/ML suspension Commonly known as:  MYCOSTATIN   nystatin cream Commonly known as:  MYCOSTATIN   ondansetron 8 MG tablet Commonly known as:  ZOFRAN   pantoprazole 40 MG tablet Commonly known as:  PROTONIX Replaced by:  pantoprazole sodium 40 mg/20 mL Pack   PROLENSA 0.07 % Soln Generic drug:  Bromfenac Sodium   senna-docusate 8.6-50 MG tablet Commonly known as:  Senokot-S     TAKE these medications   amoxicillin-clavulanate 125-31.25 MG/5ML suspension Commonly  known as:  AUGMENTIN Take 10 mLs (250 mg total) by mouth 2 (two) times daily for 5 days.   dexamethasone 10 MG/ML injection Commonly known as:  DECADRON Inject 1 mL (10 mg total) into the vein every 6 (six) hours.   fluconazole 200-0.9 MG/100ML-% IVPB Commonly known as:  DIFLUCAN Inject 100 mLs (200 mg total) into the vein daily.   lacosamide 50 mg in sodium chloride 0.9 % 25 mL Inject 50 mg into the vein every 12 (twelve) hours.   morphine 2 MG/ML injection Inject 0.5 mLs (1 mg total) into the vein every 4 (four) hours as needed.   pantoprazole sodium 40 mg/20 mL Pack Commonly known as:  PROTONIX Place 20 mLs (40 mg total) into feeding tube daily. Replaces:  pantoprazole 40 MG tablet      Follow-up Information    [facility] Follow up.   Specialty:  Hospice and Palliative Medicine Why:  Home Hospice RN-agency will call to arrange initial appointment Contact information: [place] [id number] [phone]          Allergies  Allergen Reactions  . Ciprofloxacin     AMS  . Codeine Nausea And Vomiting and Nausea Only  . 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    . Compazine [Prochlorperazine Edisylate] Other (See Comments) and Rash    Became hyper Became hyper    Consultations:  Palliative care.    Procedures/Studies: Ct Head Wo Contrast  Result Date: 12/07/2017 CLINICAL DATA:  Altered level of consciousness. Fall. History of gliosarcoma. EXAM: CT HEAD WITHOUT CONTRAST TECHNIQUE: Contiguous axial images were obtained from the base of the skull through the vertex without intravenous contrast. COMPARISON:  Head CT 10/08/2017 and MRI 08/27/2017 FINDINGS: Brain: There is markedly progressive, severe vasogenic edema throughout and centrally the entirety of the left cerebral hemisphere resulting in diffuse sulcal effacement, left lateral ventricle effacement, and 15  mm of rightward midline shift. There is mass effect on the midbrain. A new 5.5 x 3 cm mass is present in the anterior left basal ganglia region. There is new mild dilatation of the atrium and temporal horn of the right lateral ventricle likely from obstruction at the level of the foramina of [place]. No acute intracranial hemorrhage or acute cortically based infarct is identified. There is no extra-axial fluid collection. Vascular: Mild calcified atherosclerosis at the skull base. Skull: Left perianal craniotomy.  No fracture. Sinuses/Orbits: Mild left sphenoid and posterior left ethmoid sinus mucosal thickening. Clear mastoid air cells. Left cataract extraction. Other: None. IMPRESSION: 1. Markedly increased, severe edema throughout the left cerebral hemisphere with new 5.5 cm left basal ganglia mass consistent with progression of known gliosarcoma. 2. 15 mm rightward midline shift with trapping of the right lateral ventricle. 3. No acute intracranial hemorrhage. Critical Value/emergent results were called by telephone at the time of interpretation on 12/07/2017 at 7:05 pm to Dr. Merrily Pew , who verbally  acknowledged these results. Electronically Signed   By: Logan Bores M.D.   On: 12/07/2017 19:05   Dg Chest Portable 1 View  Result Date: 12/07/2017 CLINICAL DATA:  Unresponsive, altered mental status, baseline lethargy, history brain tumor EXAM: PORTABLE CHEST 1 VIEW COMPARISON:  Portable exam 1808 hours compared to 10/08/2017 FINDINGS: RIGHT arm PICC line tip projects over SVC. Normal heart size, mediastinal contours, and pulmonary vascularity. RIGHT basilar atelectasis. Question peripheral infiltrate RIGHT upper lobe. Remaining lungs clear. No pleural effusion or pneumothorax. IMPRESSION: RIGHT basilar atelectasis with question peripheral infiltrate in the RIGHT upper lobe, new since previous exam. Electronically Signed   By: Lavonia Dana M.D.   On: 12/07/2017 18:24       Subjective: Calm and  comfortable  Discharge Exam: Vitals:   12/08/17 2012 12/09/17 0515  BP: 125/66 121/75  Pulse: (!) 56 (!) 55  Resp: 20 20  Temp: (!) 97.5 F (36.4 C) (!) 97.3 F (36.3 C)  SpO2: 100% 100%   Vitals:   12/08/17 0446 12/08/17 1434 12/08/17 2012 12/09/17 0515  BP: 121/76 124/72 125/66 121/75  Pulse: 74 (!) 54 (!) 56 (!) 55  Resp:  16 20 20   Temp: 97.8 F (36.6 C) 98.5 F (36.9 C) (!) 97.5 F (36.4 C) (!) 97.3 F (36.3 C)  TempSrc: Oral  Oral Oral  SpO2: 99% 100% 100% 100%  Weight:      Height:        General: Pt is alert, awake, not in acute distress Cardiovascular: RRR, S1/S2 +,      The results of significant diagnostics from this hospitalization (including imaging, microbiology, ancillary and laboratory) are listed below for reference.     Microbiology: Recent Results (from the past 240 hour(s))  Blood Culture (routine x 2)     Status: None (Preliminary result)   Collection Time: 12/07/17  6:38 PM  Result Value Ref Range Status   Specimen Description   Final    BLOOD PICC LINE Performed at [facility], [place] [address], [place], [place] [address]    Special Requests   Final    BOTTLES DRAWN AEROBIC AND ANAEROBIC Blood Culture adequate volume Performed at [facility] [address] [place], [place] [address]    Culture   Final    NO GROWTH 2 DAYS Performed at [facility] [address], [place], [place] [address]    Report Status PENDING  Incomplete  Blood Culture (routine x 2)     Status: None (Preliminary result)   Collection Time: 12/08/17  5:22 AM  Result Value Ref Range Status   Specimen Description   Final    BLOOD SITE NOT SPECIFIED Performed at [facility], [address] [place], [place] [address]    Special Requests   Final    BOTTLES DRAWN AEROBIC ONLY Blood Culture adequate volume Performed at [facility] [address], [place], [place] [address]    Culture    Final    NO GROWTH < 24 HOURS Performed at [facility] [address], [place], [place] [address]    Report Status PENDING  Incomplete     Labs: BNP (last 3 results) No results for input(s): BNP in the last 8760 hours. Basic Metabolic Panel: Recent Labs  Lab 12/07/17 1753 12/07/17 1847 12/08/17 0522 12/09/17 0539  NA 147* 142 142 144  K 2.9* 2.9* 4.3 4.0  CL 114* 111 111 113*  CO2  22  --  20* 20*  GLUCOSE 90 85 125* 108*  BUN 8 4* 9 13  CREATININE <0.30* 0.20* 0.34* 0.31*  CALCIUM 8.8*  --  8.8* 8.7*   Liver Function Tests: Recent Labs  Lab 12/07/17 1753  AST 17  ALT 15  ALKPHOS 64  BILITOT 1.3*  PROT 6.0*  ALBUMIN 3.2*   No results for input(s): LIPASE, AMYLASE in the last 168 hours. No results for input(s): AMMONIA in the last 168 hours. CBC: Recent Labs  Lab 12/07/17 1753 12/07/17 1847 12/08/17 0522  WBC 9.9  --  9.7  NEUTROABS 8.8*  --   --   HGB 11.0* 10.2* 10.8*  HCT 35.3* 30.0* 34.7*  MCV 94.6  --  95.9  PLT 249  --  222   Cardiac Enzymes: No results for input(s): CKTOTAL, CKMB, CKMBINDEX, TROPONINI in the last 168 hours. BNP: Invalid input(s): POCBNP CBG: Recent Labs  Lab 12/07/17 1834 12/07/17 2246 12/08/17 0125 12/08/17 0441  GLUCAP 81 99 105* 120*   D-Dimer No results for input(s): DDIMER in the last 72 hours. Hgb A1c No results for input(s): HGBA1C in the last 72 hours. Lipid Profile No results for input(s): CHOL, HDL, LDLCALC, TRIG, CHOLHDL, LDLDIRECT in the last 72 hours. Thyroid function studies No results for input(s): TSH, T4TOTAL, T3FREE, THYROIDAB in the last 72 hours.  Invalid input(s): FREET3 Anemia work up No results for input(s): VITAMINB12, FOLATE, FERRITIN, TIBC, IRON, RETICCTPCT in the last 72 hours. Urinalysis    Component Value Date/Time   COLORURINE YELLOW 12/07/2017 1811   APPEARANCEUR CLEAR 12/07/2017 1811   LABSPEC 1.013 12/07/2017 1811   PHURINE 5.0 12/07/2017 1811   GLUCOSEU NEGATIVE  12/07/2017 1811   HGBUR NEGATIVE 12/07/2017 1811   BILIRUBINUR NEGATIVE 12/07/2017 1811   KETONESUR 80 (A) 12/07/2017 1811   PROTEINUR NEGATIVE 12/07/2017 1811   NITRITE NEGATIVE 12/07/2017 1811   LEUKOCYTESUR NEGATIVE 12/07/2017 1811   Sepsis Labs Invalid input(s): PROCALCITONIN,  WBC,  LACTICIDVEN Microbiology Recent Results (from the past 240 hour(s))  Blood Culture (routine x 2)     Status: None (Preliminary result)   Collection Time: 12/07/17  6:38 PM  Result Value Ref Range Status   Specimen Description   Final    BLOOD PICC LINE Performed at [facility], [place] [address] [place], [place] [address]    Special Requests   Final    BOTTLES DRAWN AEROBIC AND ANAEROBIC Blood Culture adequate volume Performed at [facility] [address] [place], [place] [address]    Culture   Final    NO GROWTH 2 DAYS Performed at [facility] [address], [place], [place] [address]    Report Status PENDING  Incomplete  Blood Culture (routine x 2)     Status: None (Preliminary result)   Collection Time: 12/08/17  5:22 AM  Result Value Ref Range Status   Specimen Description   Final    BLOOD SITE NOT SPECIFIED Performed at [facility], [address] [place], [place] [address]    Special Requests   Final    BOTTLES DRAWN AEROBIC ONLY Blood Culture adequate volume Performed at [facility] [address], [place], [place] [address]    Culture   Final    NO GROWTH < 24 HOURS Performed at [facility] [address] [place],  [address]    Report Status PENDING  Incomplete     Time coordinating  discharge: 32 minutes  SIGNED:   Hosie Poisson, MD  [facility] 12/09/2017, 10:10 AM Pager   If 7PM-7AM, please contact night-coverage [email or web address] Password [email or web address]

## 2017-12-09 NOTE — Progress Notes (Signed)
Husband at bedside states "she hasn't had anything to eat in days.  What are we doing about feeding her? I don't want her to starve to death."  Dr. Rowe Pavy and Dr. Karleen Hampshire aware.  Order for speech evaluation has been placed.  Patient remains NPO at this time.  Patient is minimally responsive at this time; opens her eyes but is unable to verbalize or follow commands.  Husband aware speech consult has been placed.  Will continue to monitor.

## 2017-12-09 NOTE — Care Management Note (Signed)
Case Management Note  Patient Details  Name: Kelly Maxwell MRN: [id number] Date of Birth: [birth year]  Subjective/Objective: Family-spouse(Kelly Maxwell), & dtr(Kelly Maxwell) has been still asking questions about home with hospice vs HHC(ivf, iv decadron,vimpat po, & diflucan po)-attending-please address so scripts can be written.They have finally agreed to [facility]-rep [name] is aware-they can have a home nurse to make home visit tomorrow @ 2:30p-PTAR forms already in shadow chart for nurse to call when ready.                     Action/Plan:d/c home w/hospice   Expected Discharge Date:  12/09/17               Expected Discharge Plan:  Home w Hospice Care  In-House Referral:     Discharge planning Services  CM Consult  Post Acute Care Choice:  Hospice Choice offered to:  Adult Children, Spouse  DME Arranged:  Other see comment DME Agency:  [place]:  RN [facility] Agency:  [facility]  Status of Service:  Completed, signed off  If discussed at [place] of Stay Meetings, dates discussed:    Additional Comments:  Dessa Phi, RN 12/09/2017, 4:12 PM

## 2017-12-09 NOTE — Progress Notes (Signed)
   12/09/17 1513  Clinical Encounter Type  Visited With Patient and family together  Visit Type Initial  Referral From Palliative care team  Consult/Referral To Chaplain  Chaplain visited bed side with Pt. husband.  Pt. daughter joined Pt. husband and chaplain at the end of the conversation. Pt. husband shared history of the couples married life and how God intersected all areas of their life together. Pt. husband stated God will continue to be present as Pt. transitions to Hospice at home. Chaplain heard a clear message of how Pt. husband plans to manage Pt. care. Pt. husband thanked chaplain for the visit and letting him talk. Chaplain informed Pt. husband chaplain's are available 24 hrs a day.

## 2017-12-10 MED ORDER — SODIUM CHLORIDE 0.9 % IV SOLN
50.0000 mg | Freq: Two times a day (BID) | INTRAVENOUS | Status: DC
  Filled 2017-12-10: qty 5

## 2017-12-10 NOTE — Progress Notes (Addendum)
Patient seen and examined this morning with daughter and husband at bedside. Planned for discharge yesterday but was unable to get IV vimpat arranged. This remains unclear, though they have oral vimpat at home, so I have recommended they use that as the patient is able to take some by mouth. Daughter concerned for aspiration, noted augmentin prescribed at discharge. Overall she seems not to have accepted the comfort measures only philosophy of hospice care. I broached the disconnect I am seeing with her and met immediate resistance. The patient remains stable for discharge to hospice, though with IV medications to be administered, I would recommend [facility]. This has been declined by the family, so she will be met at home by hospice at 2:30pm. Prescriptions reviewed, added IV saline three times weekly per daughter request and will discharge now.   Please see DC summary from 12/09/2017 by Dr. Karleen Hampshire.  Vance Gather, MD 12/10/2017 12:07 PM

## 2017-12-10 NOTE — Care Management Important Message (Signed)
Important Message  Patient Details  Name: Kelly Maxwell MRN: [id number] Date of Birth: 06-Jun-1945   Medicare Important Message Given:  Yes    Kelly Maxwell 12/10/2017, 11:59 AMImportant Message  Patient Details  Name: Kelly Maxwell MRN: [id number] Date of Birth: Oct 26, 1945   Medicare Important Message Given:  Yes    Kelly Maxwell 12/10/2017, 11:58 AM

## 2017-12-10 NOTE — Progress Notes (Signed)
SLP Cancellation Note  Patient Details Name: Kelly Maxwell MRN: [id number] DOB: 1945/04/02   Cancelled treatment:       Reason Eval/Treat Not Completed: Other (comment)(pt to dc home with hospice today, will sign off, please reorder if desired)   Macario Golds 12/10/2017, 12:38 PM   Luanna Salk, [place] SLP [place] Pager [phone] Office [phone]

## 2017-12-10 NOTE — Progress Notes (Signed)
Home w/hospice services already set up w/[facility]-rep Jennifer-they are awating patient for a home visit from nurse @ 2:30p. MD/nurse aware. Nurse to call [facility] when ready.No further CM needs

## 2017-12-10 NOTE — Consult Note (Signed)
   [facility] CM Inpatient Consult   12/10/2017  Kelly Maxwell [birth year] [id number]     Patient screened for potential [facility] Care Management services due to unplanned readmission risk score of 27% (high) and multiple hospitalizations.  Chart review reveals patient will discharge home with hospice services. No identifiable [facility] Care Management needs at this time.    Marthenia Rolling, MSN-Ed, RN,BSN [facility] Liaison [phone]

## 2017-12-12 LAB — CULTURE, BLOOD (ROUTINE X 2)
CULTURE: NO GROWTH
SPECIAL REQUESTS: ADEQUATE

## 2017-12-13 LAB — CULTURE, BLOOD (ROUTINE X 2)
CULTURE: NO GROWTH
SPECIAL REQUESTS: ADEQUATE

## 2017-12-14 DIAGNOSIS — Z9181 History of falling: Secondary | ICD-10-CM | POA: Diagnosis not present

## 2017-12-14 DIAGNOSIS — R269 Unspecified abnormalities of gait and mobility: Secondary | ICD-10-CM | POA: Diagnosis not present

## 2017-12-14 DIAGNOSIS — L989 Disorder of the skin and subcutaneous tissue, unspecified: Secondary | ICD-10-CM | POA: Diagnosis not present

## 2017-12-14 DIAGNOSIS — K436 Other and unspecified ventral hernia with obstruction, without gangrene: Secondary | ICD-10-CM | POA: Diagnosis not present

## 2017-12-14 DIAGNOSIS — C719 Malignant neoplasm of brain, unspecified: Secondary | ICD-10-CM | POA: Diagnosis not present

## 2017-12-14 DIAGNOSIS — R0902 Hypoxemia: Secondary | ICD-10-CM | POA: Diagnosis not present

## 2017-12-14 DIAGNOSIS — L89151 Pressure ulcer of sacral region, stage 1: Secondary | ICD-10-CM | POA: Diagnosis not present

## 2017-12-14 DIAGNOSIS — C712 Malignant neoplasm of temporal lobe: Secondary | ICD-10-CM | POA: Diagnosis not present

## 2017-12-14 DIAGNOSIS — E860 Dehydration: Secondary | ICD-10-CM | POA: Diagnosis not present

## 2017-12-14 DIAGNOSIS — M6281 Muscle weakness (generalized): Secondary | ICD-10-CM | POA: Diagnosis not present

## 2017-12-18 ENCOUNTER — Inpatient Hospital Stay: Payer: Medicare HMO | Admitting: Internal Medicine

## 2017-12-19 DIAGNOSIS — K436 Other and unspecified ventral hernia with obstruction, without gangrene: Secondary | ICD-10-CM | POA: Diagnosis not present

## 2017-12-19 DIAGNOSIS — Z9181 History of falling: Secondary | ICD-10-CM | POA: Diagnosis not present

## 2017-12-19 DIAGNOSIS — M6281 Muscle weakness (generalized): Secondary | ICD-10-CM | POA: Diagnosis not present

## 2017-12-19 DIAGNOSIS — R269 Unspecified abnormalities of gait and mobility: Secondary | ICD-10-CM | POA: Diagnosis not present

## 2017-12-23 ENCOUNTER — Encounter: Payer: Self-pay | Admitting: Internal Medicine

## 2017-12-26 ENCOUNTER — Other Ambulatory Visit: Payer: Self-pay | Admitting: Internal Medicine

## 2017-12-30 ENCOUNTER — Other Ambulatory Visit: Payer: Self-pay | Admitting: Internal Medicine

## 2018-01-03 DEATH — deceased

## 2019-08-24 ENCOUNTER — Other Ambulatory Visit: Payer: Self-pay | Admitting: Pharmacist

## 2020-01-28 IMAGING — CT CT ABD-PELV W/ CM
3 of 11 series · 11 of 46 positions shown, 17 images · IV contrast (ISOVUE)
Comparison: CT of the abdomen and pelvis performed 09/02/2015, and
CT of the chest performed 12/14/2011

CLINICAL DATA: Acute onset of altered mental status. Generalized

EXAM:
CT ANGIOGRAPHY CHEST
CT ABDOMEN AND PELVIS WITH CONTRAST
TECHNIQUE: Multidetector CT imaging of the chest was performed using the
standard protocol during bolus administration of intravenous
contrast. Multiplanar CT image reconstructions and MIPs were
obtained to evaluate the vascular anatomy. Multidetector CT imaging
of the abdomen and pelvis was performed using the standard protocol
during bolus administration of intravenous contrast.
CONTRAST:  100mL 1TWMC8-E1F IOPAMIDOL (1TWMC8-E1F) INJECTION 76%

[Series 5: thins · axial · 0.69mm/px · z∈[-344,-191]mm · 6 of 270 slices shown]
[im 20/270  soft-tissue]
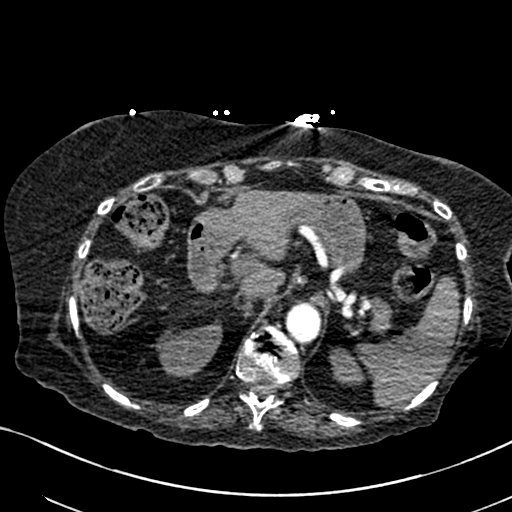
[im 58/270  soft-tissue]
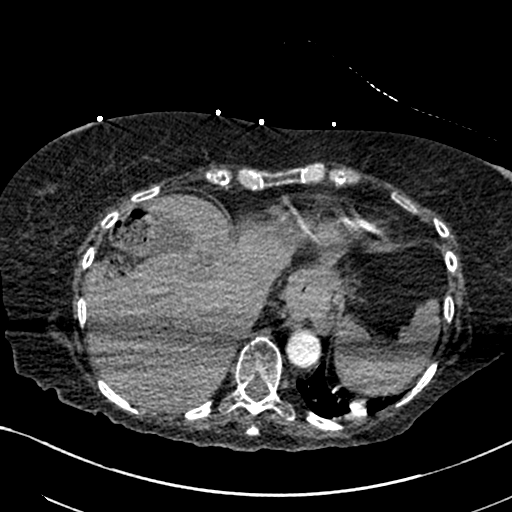
[im 97/270  soft-tissue]
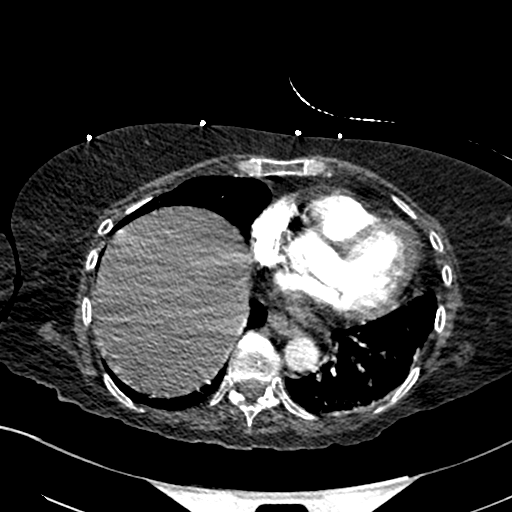
[im 116/270  soft-tissue]
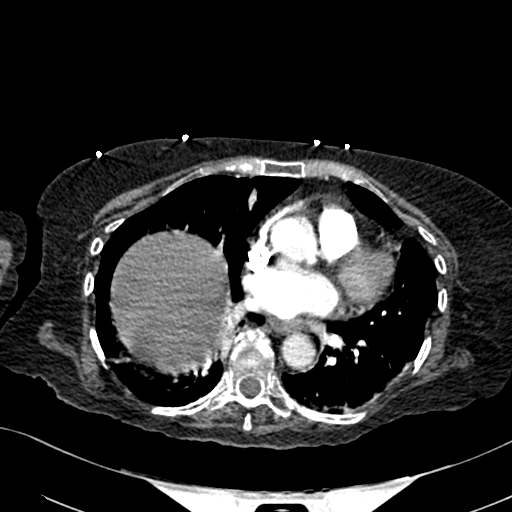
[im 154/270  soft-tissue]
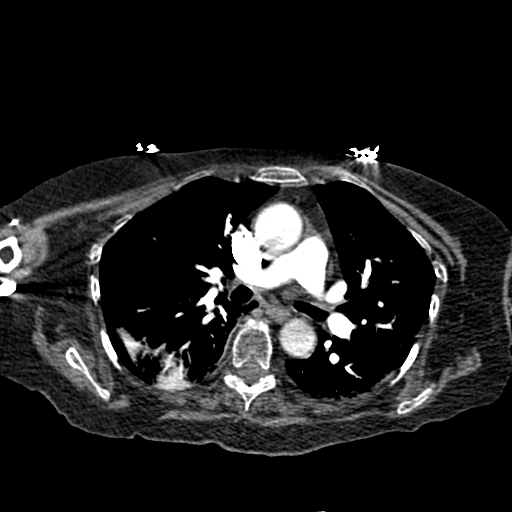
[im 173/270  soft-tissue]
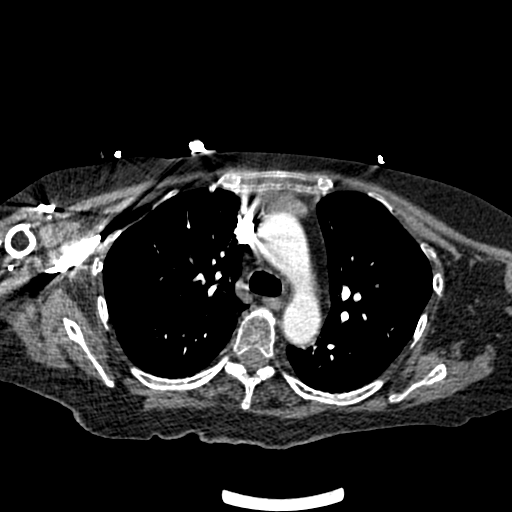

[Series 6: coronal mpr · coronal · 0.54mm/px · 1 of 106 slices shown, 2 images]
[im 53/106  soft-tissue]
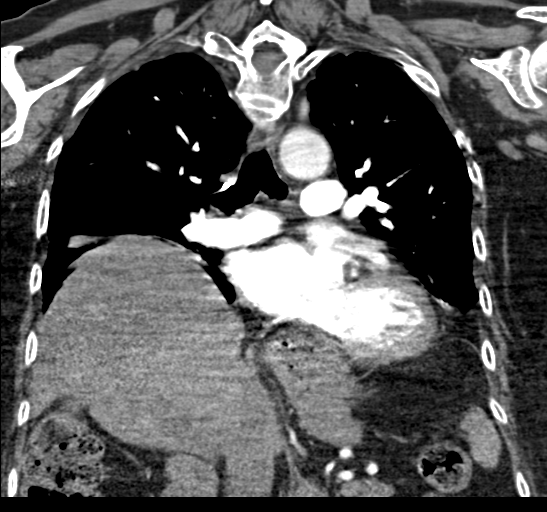
[im 53/106  bone]
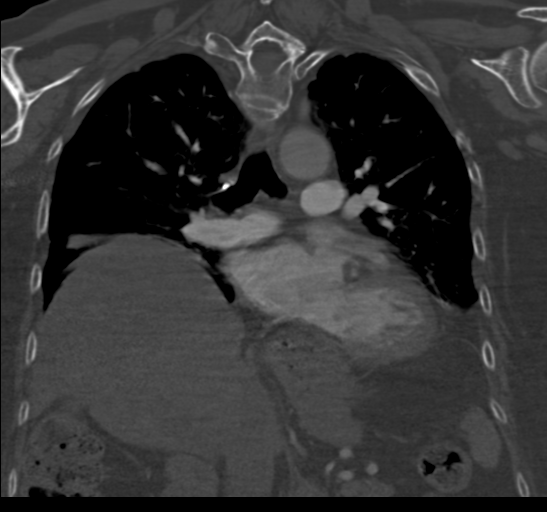

[Series 11: axial st · axial · 0.72mm/px · z∈[-630,-315]mm · 4 of 107 slices shown, 9 images]
[im 22/107  soft-tissue]
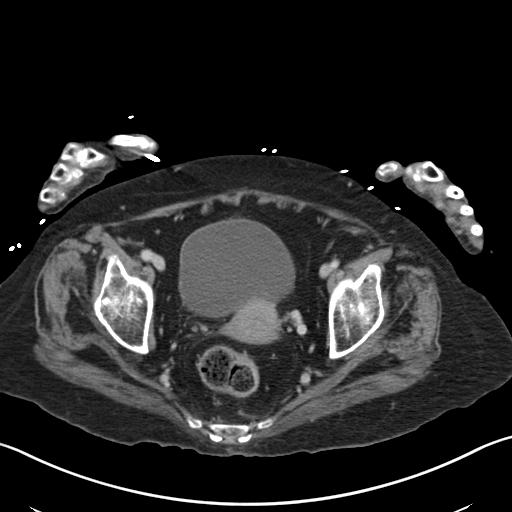
[im 22/107  lung]
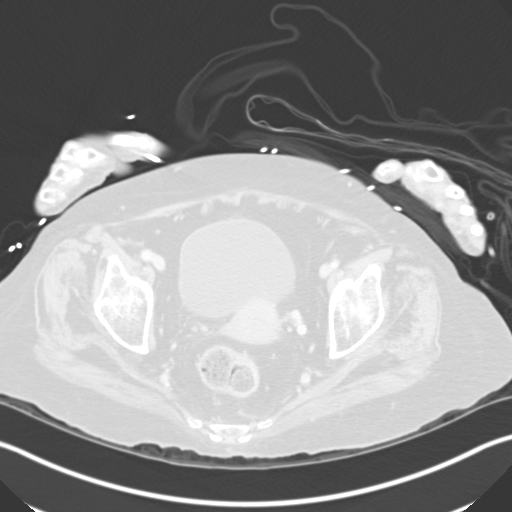
[im 22/107  bone]
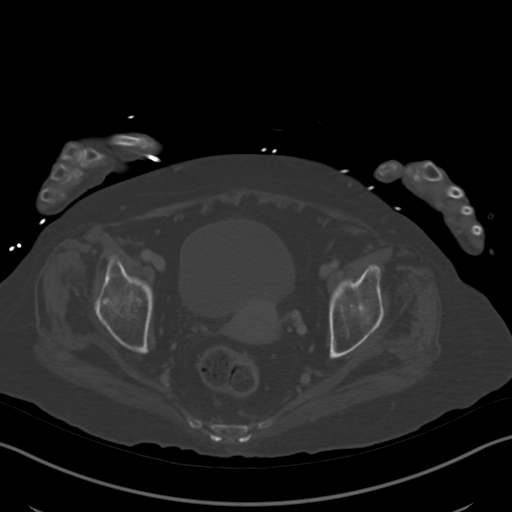
[im 43/107  soft-tissue]
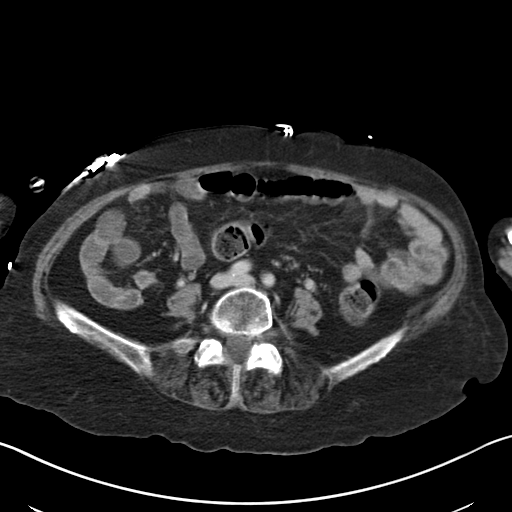
[im 43/107  lung]
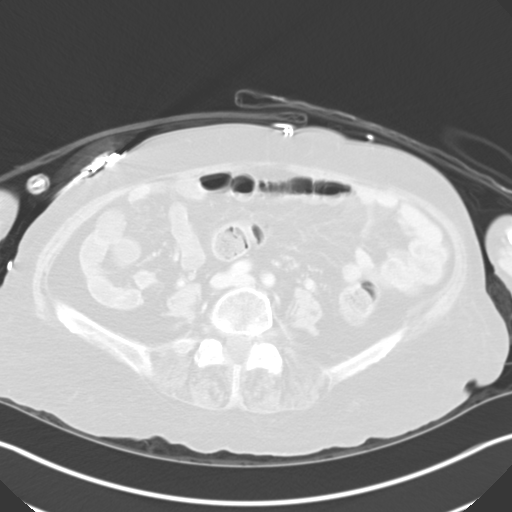
[im 64/107  soft-tissue]
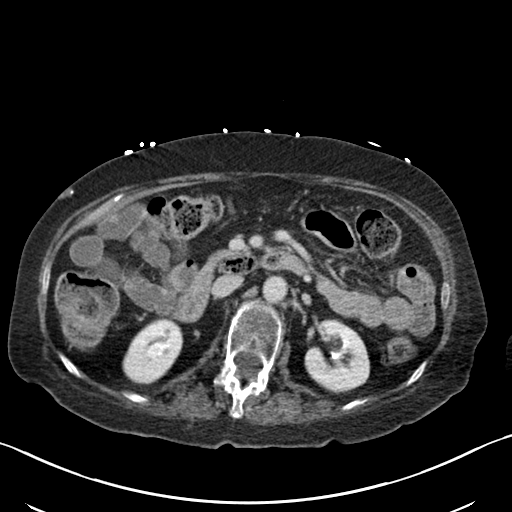
[im 64/107  lung]
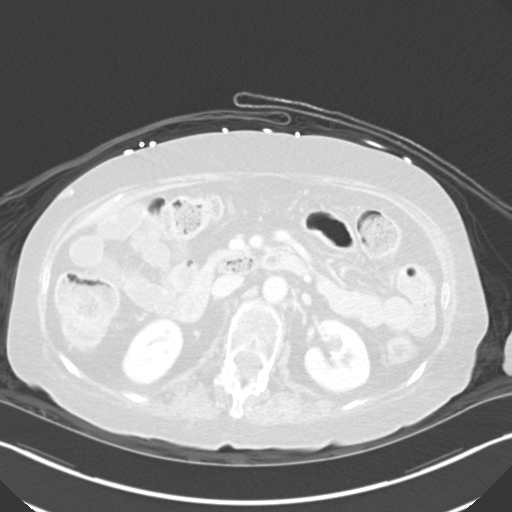
[im 85/107  soft-tissue]
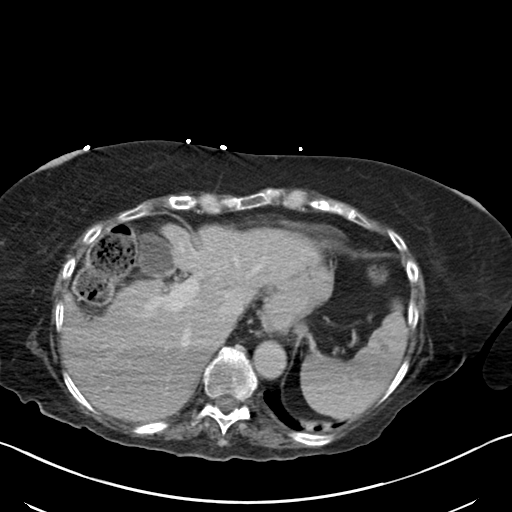
[im 85/107  lung]
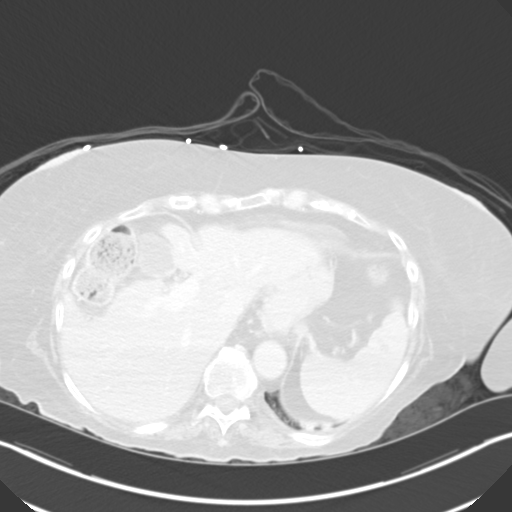

[11 of 46 positions shown; findings below may reference images not displayed]

FINDINGS: CTA CHEST FINDINGS

Cardiovascular:  There is no evidence of pulmonary embolus.

The heart is normal in size. The thoracic aorta is grossly
unremarkable. The great vessels are within normal limits.

Mediastinum/Nodes: The mediastinum is unremarkable in appearance. No
mediastinal lymphadenopathy is seen. No pericardial effusion is
identified. The left thyroid lobe is mildly heterogeneous but
otherwise unremarkable. The right thyroid lobe is diminutive. No
axillary lymphadenopathy is seen.

A moderate hiatal hernia is noted.

Lungs/Pleura: Bilateral atelectasis or scarring is noted. No pleural
effusion or pneumothorax is seen. No masses are identified.

Musculoskeletal: No acute osseous abnormalities are identified. The
visualized musculature is unremarkable in appearance.

Review of the MIP images confirms the above findings.

CT ABDOMEN and PELVIS FINDINGS

Hepatobiliary: The liver is unremarkable in appearance. The
gallbladder is unremarkable in appearance. The common bile duct
remains normal in caliber.

Pancreas: The pancreas is within normal limits.

Spleen: A few small hypodensities within the spleen may reflect
small cysts.

Adrenals/Urinary Tract: The adrenal glands are unremarkable in
appearance.

A small right renal cyst is noted. There is no evidence of
hydronephrosis. No renal or ureteral stones are identified. No
perinephric stranding is appreciated.

Stomach/Bowel: The stomach is unremarkable in appearance. The small
bowel is within normal limits. The appendix is normal in caliber,
without evidence of appendicitis. The colon is unremarkable in
appearance.

Mild wall thickening at the rectum could reflect mild chronic
inflammation or proctitis.

Vascular/Lymphatic: Scattered calcification is seen along the
abdominal aorta and its branches. The abdominal aorta is otherwise
grossly unremarkable. The inferior vena cava is grossly
unremarkable. No retroperitoneal lymphadenopathy is seen. No pelvic
sidewall lymphadenopathy is identified.

Reproductive: The bladder is mildly distended and grossly
unremarkable. The uterus is unremarkable in appearance. The ovaries
are relatively symmetric. No suspicious adnexal masses are seen.

Other: No additional soft tissue abnormalities are seen.

Musculoskeletal: No acute osseous abnormalities are identified.
Multilevel vacuum phenomenon is noted along the lower thoracic and
lumbar spine. The visualized musculature is unremarkable in
appearance.

Review of the MIP images confirms the above findings.
IMPRESSION: 1. No evidence of pulmonary embolus.
2. No acute abnormality seen to explain the patient's symptoms.
3. Mild wall thickening at the rectum could reflect mild chronic
inflammation or possibly proctitis.
4. Moderate hiatal hernia noted.
5. Bilateral atelectasis or scarring noted. Lungs otherwise clear.
6. Small right renal cyst noted.

Aortic Atherosclerosis ([id number]).
